# Patient Record
Sex: Female | Born: 1945 | Race: White | Hispanic: No | Marital: Married | Smoking: Never smoker
Health system: Southern US, Community
[De-identification: ages and names within clinical notes are randomized; demographics above are authoritative.]

## PROBLEM LIST (undated history)

## (undated) DIAGNOSIS — E669 Obesity, unspecified: Secondary | ICD-10-CM

## (undated) DIAGNOSIS — Z92241 Personal history of systemic steroid therapy: Secondary | ICD-10-CM

## (undated) DIAGNOSIS — G43009 Migraine without aura, not intractable, without status migrainosus: Secondary | ICD-10-CM

## (undated) DIAGNOSIS — S139XXA Sprain of joints and ligaments of unspecified parts of neck, initial encounter: Secondary | ICD-10-CM

## (undated) DIAGNOSIS — S300XXA Contusion of lower back and pelvis, initial encounter: Secondary | ICD-10-CM

## (undated) DIAGNOSIS — M1990 Unspecified osteoarthritis, unspecified site: Secondary | ICD-10-CM

## (undated) DIAGNOSIS — E785 Hyperlipidemia, unspecified: Secondary | ICD-10-CM

## (undated) DIAGNOSIS — Z9889 Other specified postprocedural states: Secondary | ICD-10-CM

## (undated) DIAGNOSIS — T8859XA Other complications of anesthesia, initial encounter: Secondary | ICD-10-CM

## (undated) DIAGNOSIS — G4730 Sleep apnea, unspecified: Secondary | ICD-10-CM

## (undated) DIAGNOSIS — Z8669 Personal history of other diseases of the nervous system and sense organs: Secondary | ICD-10-CM

## (undated) DIAGNOSIS — R400 Somnolence: Secondary | ICD-10-CM

## (undated) DIAGNOSIS — T4145XA Adverse effect of unspecified anesthetic, initial encounter: Secondary | ICD-10-CM

## (undated) DIAGNOSIS — G4733 Obstructive sleep apnea (adult) (pediatric): Secondary | ICD-10-CM

## (undated) DIAGNOSIS — R251 Tremor, unspecified: Secondary | ICD-10-CM

## (undated) DIAGNOSIS — F418 Other specified anxiety disorders: Secondary | ICD-10-CM

## (undated) DIAGNOSIS — H269 Unspecified cataract: Secondary | ICD-10-CM

## (undated) DIAGNOSIS — G44209 Tension-type headache, unspecified, not intractable: Secondary | ICD-10-CM

## (undated) DIAGNOSIS — C50919 Malignant neoplasm of unspecified site of unspecified female breast: Secondary | ICD-10-CM

## (undated) DIAGNOSIS — E538 Deficiency of other specified B group vitamins: Secondary | ICD-10-CM

## (undated) DIAGNOSIS — R413 Other amnesia: Secondary | ICD-10-CM

## (undated) DIAGNOSIS — R569 Unspecified convulsions: Secondary | ICD-10-CM

## (undated) DIAGNOSIS — I4891 Unspecified atrial fibrillation: Secondary | ICD-10-CM

## (undated) DIAGNOSIS — R112 Nausea with vomiting, unspecified: Secondary | ICD-10-CM

## (undated) DIAGNOSIS — G250 Essential tremor: Secondary | ICD-10-CM

## (undated) DIAGNOSIS — N189 Chronic kidney disease, unspecified: Secondary | ICD-10-CM

## (undated) DIAGNOSIS — I10 Essential (primary) hypertension: Secondary | ICD-10-CM

## (undated) DIAGNOSIS — M797 Fibromyalgia: Secondary | ICD-10-CM

## (undated) HISTORY — PX: BASAL CELL CARCINOMA EXCISION: SHX1214

## (undated) HISTORY — PX: DILATION AND CURETTAGE OF UTERUS: SHX78

## (undated) HISTORY — PX: CATARACT EXTRACTION, BILATERAL: SHX1313

## (undated) HISTORY — DX: Personal history of systemic steroid therapy: Z92.241

## (undated) HISTORY — DX: Obesity, unspecified: E66.9

## (undated) HISTORY — DX: Unspecified cataract: H26.9

## (undated) HISTORY — DX: Migraine without aura, not intractable, without status migrainosus: G43.009

## (undated) HISTORY — DX: Chronic kidney disease, unspecified: N18.9

## (undated) HISTORY — DX: Sprain of joints and ligaments of unspecified parts of neck, initial encounter: S13.9XXA

## (undated) HISTORY — DX: Essential (primary) hypertension: I10

## (undated) HISTORY — DX: Contusion of lower back and pelvis, initial encounter: S30.0XXA

## (undated) HISTORY — DX: Tremor, unspecified: R25.1

## (undated) HISTORY — PX: ABDOMINAL HYSTERECTOMY: SHX81

## (undated) HISTORY — PX: BRAIN MENINGIOMA EXCISION: SHX576

## (undated) HISTORY — DX: Obstructive sleep apnea (adult) (pediatric): G47.33

## (undated) HISTORY — DX: Hyperlipidemia, unspecified: E78.5

## (undated) HISTORY — PX: CHOLECYSTECTOMY: SHX55

## (undated) HISTORY — PX: TUBAL LIGATION: SHX77

## (undated) HISTORY — DX: Tension-type headache, unspecified, not intractable: G44.209

## (undated) HISTORY — DX: Unspecified atrial fibrillation: I48.91

## (undated) HISTORY — PX: LUMBAR SPINE SURGERY: SHX701

## (undated) HISTORY — DX: Other specified anxiety disorders: F41.8

## (undated) HISTORY — DX: Personal history of other diseases of the nervous system and sense organs: Z86.69

## (undated) HISTORY — DX: Unspecified convulsions: R56.9

## (undated) HISTORY — DX: Malignant neoplasm of unspecified site of unspecified female breast: C50.919

## (undated) HISTORY — DX: Somnolence: R40.0

## (undated) HISTORY — DX: Fibromyalgia: M79.7

## (undated) HISTORY — DX: Unspecified osteoarthritis, unspecified site: M19.90

## (undated) HISTORY — PX: OTHER SURGICAL HISTORY: SHX169

## (undated) HISTORY — DX: Deficiency of other specified B group vitamins: E53.8

## (undated) HISTORY — PX: NOSE SURGERY: SHX723

## (undated) HISTORY — DX: Other amnesia: R41.3

## (undated) HISTORY — DX: Essential tremor: G25.0

## (undated) HISTORY — DX: Sleep apnea, unspecified: G47.30

## (undated) HISTORY — PX: BREAST RECONSTRUCTION: SHX9

---

## 1982-11-07 DIAGNOSIS — C801 Malignant (primary) neoplasm, unspecified: Secondary | ICD-10-CM | POA: Insufficient documentation

## 2011-11-08 DIAGNOSIS — Z8669 Personal history of other diseases of the nervous system and sense organs: Secondary | ICD-10-CM | POA: Insufficient documentation

## 2013-02-04 ENCOUNTER — Encounter: Payer: Self-pay | Admitting: Neurology

## 2013-02-04 ENCOUNTER — Ambulatory Visit (INDEPENDENT_AMBULATORY_CARE_PROVIDER_SITE_OTHER): Payer: Medicare Other | Admitting: Neurology

## 2013-02-04 VITALS — BP 114/68 | HR 61 | Ht 69.0 in | Wt 234.0 lb

## 2013-02-04 DIAGNOSIS — M545 Low back pain, unspecified: Secondary | ICD-10-CM

## 2013-02-04 DIAGNOSIS — I480 Paroxysmal atrial fibrillation: Secondary | ICD-10-CM | POA: Insufficient documentation

## 2013-02-04 DIAGNOSIS — D518 Other vitamin B12 deficiency anemias: Secondary | ICD-10-CM

## 2013-02-04 DIAGNOSIS — R413 Other amnesia: Secondary | ICD-10-CM

## 2013-02-04 DIAGNOSIS — G8929 Other chronic pain: Secondary | ICD-10-CM | POA: Insufficient documentation

## 2013-02-04 DIAGNOSIS — I4891 Unspecified atrial fibrillation: Secondary | ICD-10-CM

## 2013-02-04 DIAGNOSIS — G4733 Obstructive sleep apnea (adult) (pediatric): Secondary | ICD-10-CM

## 2013-02-04 DIAGNOSIS — R6889 Other general symptoms and signs: Secondary | ICD-10-CM

## 2013-02-04 DIAGNOSIS — G4730 Sleep apnea, unspecified: Secondary | ICD-10-CM | POA: Insufficient documentation

## 2013-02-04 NOTE — Progress Notes (Signed)
Reason for visit: Memory disorder  Debbie [name] is an 67 y.o. female  History of present illness:  Debbie [name] is a 67 year old right-handed white female with a history of a mild memory disorder, and a history of chronic low back pain and fibromyalgia. The patient indicates that since she was seen in January 2014, her low back issues have worsened. The patient has pain into her right hip, and down the right leg today. The patient also has some discomfort going up the right side back into the right shoulder and neck. The patient indicates that if she turns her head to the left, she gets pain down the right arm. The patient is ambulating with a cane. The patient takes gabapentin at nighttime, but she has ongoing discomfort. The patient may have some discomfort into the right lower abdomen with standing. The patient returns for an evaluation.  Past Medical History  Diagnosis Date  . Hypertension   . Atrial fibrillation   . Depression with anxiety   . Fibromyalgia   . Breast cancer   . Muscle tension headache   . History of partial seizures   . Benign essential tremor   . Obesity   . Drowsiness     Excessive daytime  . Obstructive sleep apnea   . Vitamin B12 deficiency   . Tremors of nervous system   . Dyslipidemia   . Chronic renal insufficiency   . Seizures     Partial  . Memory disturbance     Past Surgical History  Procedure Laterality Date  . Brain meningioma excision Right   . Mastectomies Bilateral   . Abdominal hysterectomy    . Breast reconstructive    . Breast reconstruction Bilateral   . Lumbosacral    . Lumbar spine surgery N/A   . Tubal ligation N/A   . Gallbladder resection    . Cholecystectomy    . Basal cell carcinoma excision    . Basal cell carcinoma excision    . Nose surgery      Basal cell carcinoma resection from the nose  . Dilution    . Dilation and curettage of uterus      Family History  Problem Relation Age of Onset  . Heart failure  Mother   . Diabetes Mother   . Cancer - Colon Father   . Cancer - Colon Sister     Breast cancer  . Stroke Maternal Grandmother   . Diabetes Maternal Grandfather   . Heart failure Maternal Grandfather     Social history:  reports that she has never smoked. She does not have any smokeless tobacco history on file. She reports that she does not drink alcohol or use illicit drugs.  Allergies:  Allergies  Allergen Reactions  . Adhesive (Tape)   . Codeine     Nausea and vomiting  . Fish Oil   . Morphine And Related     Nausea and vomiting    Medications:  No current outpatient prescriptions on file prior to visit.   No current facility-administered medications on file prior to visit.    ROS:  Out of a complete 14 system review of symptoms, the patient complains only of the following symptoms, and all other reviewed systems are negative.  Weight gain Fatigue Swelling in the legs Moles Double vision Shortness of breath, snoring Constipation Urination problems Anemia, easy bruising Joint pain, joint swelling, achy muscles Skin sensitivity Headache, numbness, weakness, dizziness, occasional tremor Insomnia, decreased energy  Blood pressure  114/68, pulse 61, height 5\' 9"  (1.753 m), weight 234 lb (106.142 kg).  Physical Exam  General: The patient is alert and cooperative at the time of the examination. The patient is moderately obese.  Skin: No significant peripheral edema is noted.  Neuromuscular: The patient has pain with internal and external rotation of the right hip, and she has pain with external rotation of the right shoulder. The patient has discomfort with palpation over the right SI joint.   Neurologic Exam  Cranial nerves: Facial symmetry is present. Speech is normal, no aphasia or dysarthria is noted. Extraocular movements are full. Visual fields are full.  Motor: The patient has good strength in all 4 extremities. The patient has giveaway weakness with  flexion at the hip on the right.  Coordination: The patient has good finger-nose-finger and heel-to-shin bilaterally.  Gait and station: The patient has a normal gait. The patient walks with a cane. Tandem gait was not attempted.. Romberg is negative. No drift is seen.  Reflexes: Deep tendon reflexes are symmetric, but are depressed.   Assessment/Plan:  1. Memory disturbance  2. Low back pain, right leg pain  3. Fibromyalgia  The patient believes that her back pain has significantly worsened. The patient has pain in the right leg with internal and external rotation of the hip, and she has significant pain radiating down the right leg with palpation over the right SI joint. The patient has had prior lumbar spine surgery. The patient will be set up for MRI evaluation of the spine, and an x-ray of the right hip. If the studies are unremarkable, the patient may be set up for an SI joint injection on the right. The patient also has some discomfort with external rotation of the right shoulder, and she may have intrinsic arthritis in this area as well. The patient will followup in 3 months.  Marlan Palau MD 02/04/2013 8:00 PM  [facility] [address] [place], [place] [address]  Phone [phone] Fax [phone]

## 2013-02-05 ENCOUNTER — Telehealth: Payer: Self-pay | Admitting: Neurology

## 2013-02-05 ENCOUNTER — Ambulatory Visit
Admission: RE | Admit: 2013-02-05 | Discharge: 2013-02-05 | Disposition: A | Discharge status: Home / Self Care | Payer: Medicare Other | Source: Ambulatory Visit | Attending: Neurology | Admitting: Neurology

## 2013-02-05 DIAGNOSIS — M545 Low back pain, unspecified: Secondary | ICD-10-CM

## 2013-02-05 NOTE — Telephone Encounter (Signed)
I called patient. The x-ray of the right hip shows minimal arthritis. We will pursue MRI evaluation of the lumbosacral spine. If this is unremarkable, I'll set the patient up for an SI joint injection on the right.

## 2013-02-13 ENCOUNTER — Ambulatory Visit
Admission: RE | Admit: 2013-02-13 | Discharge: 2013-02-13 | Disposition: A | Discharge status: Home / Self Care | Payer: Medicare Other | Source: Ambulatory Visit | Attending: Neurology | Admitting: Neurology

## 2013-02-13 DIAGNOSIS — M545 Low back pain, unspecified: Secondary | ICD-10-CM

## 2013-02-15 ENCOUNTER — Telehealth: Payer: Self-pay | Admitting: Neurology

## 2013-02-15 DIAGNOSIS — M79609 Pain in unspecified limb: Secondary | ICD-10-CM

## 2013-02-15 NOTE — Telephone Encounter (Signed)
I called patient. The MRI of the low back shows multiple levels of disc bulges that are mild. The patient has had prior surgery at L5-S1 level of the left. There is no evidence of nerve root impingement or severe spinal stenosis. The patient will be set up for a right SI joint injection. The patient continues to have pain into the right hip and down all the way to the foot. If the SI joint injection does not help, we may do EMG and nerve conduction study evaluation.

## 2013-02-19 ENCOUNTER — Other Ambulatory Visit: Payer: Self-pay | Admitting: Neurology

## 2013-02-19 DIAGNOSIS — M549 Dorsalgia, unspecified: Secondary | ICD-10-CM

## 2013-02-19 DIAGNOSIS — M25551 Pain in right hip: Secondary | ICD-10-CM

## 2013-02-20 ENCOUNTER — Ambulatory Visit
Admission: RE | Admit: 2013-02-20 | Discharge: 2013-02-20 | Disposition: A | Discharge status: Home / Self Care | Payer: Medicare Other | Source: Ambulatory Visit | Attending: Neurology | Admitting: Neurology

## 2013-02-20 DIAGNOSIS — M25551 Pain in right hip: Secondary | ICD-10-CM

## 2013-02-20 DIAGNOSIS — M549 Dorsalgia, unspecified: Secondary | ICD-10-CM

## 2013-02-20 MED ORDER — METHYLPREDNISOLONE ACETATE 40 MG/ML INJ SUSP (RADIOLOG
120.0000 mg | Freq: Once | INTRAMUSCULAR | Status: AC
  Administered 2013-02-20: 120 mg via INTRA_ARTICULAR

## 2013-02-20 MED ORDER — IOHEXOL 180 MG/ML  SOLN
1.0000 mL | Freq: Once | INTRAMUSCULAR | Status: AC | PRN
  Administered 2013-02-20: 1 mL via INTRA_ARTICULAR

## 2013-03-05 ENCOUNTER — Ambulatory Visit: Payer: Self-pay | Admitting: Neurology

## 2013-05-07 ENCOUNTER — Telehealth: Payer: Self-pay | Admitting: Neurology

## 2013-05-07 MED ORDER — PREDNISONE 10 MG PO TABS: ORAL_TABLET | ORAL | Status: DC

## 2013-05-07 NOTE — Telephone Encounter (Signed)
Patient c/o of pain in shoulders and lower back. Pain 9 on pain scale. Has been present since 1 week after hip injection, but feels it has worsened. Nothing alleviates. Patients next OV is 06/26/13. Explained unfortunately no earlier OV at the moment, proceed to ER for urgent matters, advised could be placed on waiting list. Will notify MD for advice. Patient agreed.

## 2013-05-07 NOTE — Telephone Encounter (Signed)
I called patient. She has gotten improvement from the right SI joint injection, without hip pain or pain down the right leg. The patient does have some low back pain, and she will get a lumbar support device. The patient is now having pain in both shoulders and shoulder blade areas. The patient does have some neck pain. The patient denies pain down arms. I'll give a trial on a prednisone Dosepak, and if this is not effective, we may consider MRI evaluation of the cervical spine.

## 2013-05-08 ENCOUNTER — Telehealth: Payer: Self-pay

## 2013-05-08 MED ORDER — PREDNISONE 10 MG PO TABS: ORAL_TABLET | ORAL | Status: DC

## 2013-05-08 NOTE — Telephone Encounter (Signed)
Message copied by Malachy Moan on Wed May 08, 2013 12:23 PM ------      Message from: Seth Bake      Created: Wed May 08, 2013 12:12 PM       Please change the Prednisone RX to [facility] on [address].  It was sent to [facility] and her insurance wont pay them.  Please send today to [facility]   ------

## 2013-06-08 ENCOUNTER — Other Ambulatory Visit: Payer: Self-pay | Admitting: Neurology

## 2013-06-26 ENCOUNTER — Encounter: Payer: Self-pay | Admitting: Neurology

## 2013-06-26 ENCOUNTER — Ambulatory Visit (INDEPENDENT_AMBULATORY_CARE_PROVIDER_SITE_OTHER): Payer: Medicare Other | Admitting: Neurology

## 2013-06-26 VITALS — BP 111/53 | HR 62 | Wt 236.0 lb

## 2013-06-26 DIAGNOSIS — M47812 Spondylosis without myelopathy or radiculopathy, cervical region: Secondary | ICD-10-CM

## 2013-06-26 DIAGNOSIS — S139XXA Sprain of joints and ligaments of unspecified parts of neck, initial encounter: Secondary | ICD-10-CM

## 2013-06-26 DIAGNOSIS — S139XXD Sprain of joints and ligaments of unspecified parts of neck, subsequent encounter: Secondary | ICD-10-CM

## 2013-06-26 DIAGNOSIS — M545 Low back pain, unspecified: Secondary | ICD-10-CM

## 2013-06-26 DIAGNOSIS — S339XXA Sprain of unspecified parts of lumbar spine and pelvis, initial encounter: Secondary | ICD-10-CM

## 2013-06-26 DIAGNOSIS — Z5189 Encounter for other specified aftercare: Secondary | ICD-10-CM

## 2013-06-26 HISTORY — DX: Sprain of joints and ligaments of unspecified parts of neck, initial encounter: S13.9XXA

## 2013-06-26 MED ORDER — PROMETHAZINE HCL 25 MG PO TABS
25.0000 mg | ORAL_TABLET | Freq: Four times a day (QID) | ORAL | Status: DC | PRN
Start: 2013-06-26 — End: 2013-10-26

## 2013-06-26 MED ORDER — LAMOTRIGINE 25 MG PO TABS: 25.0000 mg | ORAL_TABLET | Freq: Two times a day (BID) | ORAL | Status: DC

## 2013-06-26 MED ORDER — GABAPENTIN 300 MG PO CAPS: 300.0000 mg | ORAL_CAPSULE | Freq: Two times a day (BID) | ORAL | Status: DC

## 2013-06-26 NOTE — Progress Notes (Signed)
Reason for visit: Back pain  Mahasin Riviere is an 67 y.o. female  History of present illness:  Ms. Radu is a 67 year old right-handed white female with a history of low back pain. In the past, the patient has benefited with her right hip and leg discomfort following a right SI joint injection. The patient however, still has low back pain. The patient has recently developed some problems with bilateral shoulder and neck discomfort. The patient also has arthritis affecting the left thumb that is quite painful. The patient was recently given a trial on prednisone which markedly improved her symptoms, but she has had some issues with atrial fibrillation. The patient is currently on diltiazem for this reason, and she will be considered for an ablation procedure in the near future. The patient returns for an evaluation. The patient is on gabapentin taking 300 mg twice daily, but she is unable to tolerate higher doses secondary to drowsiness.  Past Medical History  Diagnosis Date  . Hypertension   . Atrial fibrillation   . Depression with anxiety   . Fibromyalgia   . Breast cancer   . Muscle tension headache   . History of partial seizures   . Benign essential tremor   . Obesity   . Drowsiness     Excessive daytime  . Obstructive sleep apnea   . Vitamin B12 deficiency   . Tremors of nervous system   . Dyslipidemia   . Chronic renal insufficiency   . Seizures     Partial  . Memory disturbance   . Sprain of neck 06/26/2013    Past Surgical History  Procedure Laterality Date  . Brain meningioma excision Right   . Mastectomies Bilateral   . Abdominal hysterectomy    . Breast reconstructive    . Breast reconstruction Bilateral   . Lumbosacral    . Lumbar spine surgery N/A   . Tubal ligation N/A   . Gallbladder resection    . Cholecystectomy    . Basal cell carcinoma excision    . Basal cell carcinoma excision    . Nose surgery      Basal cell carcinoma resection from the nose   . Dilution    . Dilation and curettage of uterus      Family History  Problem Relation Age of Onset  . Heart failure Mother   . Diabetes Mother   . Cancer - Colon Father   . Cancer - Colon Sister     Breast cancer  . Stroke Maternal Grandmother   . Diabetes Maternal Grandfather   . Heart failure Maternal Grandfather     Social history:  reports that she has never smoked. She has never used smokeless tobacco. She reports that she does not drink alcohol or use illicit drugs.    Allergies  Allergen Reactions  . Adhesive [Tape] Other (See Comments)    Peels skin off (bandaids, too)  . Codeine Nausea And Vomiting  . Fish Oil Nausea And Vomiting  . Morphine And Related Nausea And Vomiting    Medications:  Current Outpatient Prescriptions on File Prior to Visit  Medication Sig Dispense Refill  . acetaminophen (TYLENOL) 500 MG tablet Take 1,000 mg by mouth every 6 (six) hours as needed for pain.       Marland Kitchen aspirin 325 MG tablet Take 325 mg by mouth daily.      . cyanocobalamin 100 MCG tablet Take 100 mcg by mouth daily.      Marland Kitchen dronedarone (MULTAQ)  400 MG tablet Take 400 mg by mouth 2 (two) times daily with a meal.      . fenofibrate 160 MG tablet Take 160 mg by mouth daily.      Marland Kitchen lisinopril (PRINIVIL,ZESTRIL) 5 MG tablet Take 5 mg by mouth daily.       No current facility-administered medications on file prior to visit.    ROS:  Out of a complete 14 system review of symptoms, the patient complains only of the following symptoms, and all other reviewed systems are negative.  Fatigue Shortness of breath, snoring Constipation Moles Urination problems Easy bruising Joint pain, achy muscles Skin sensitivity Weakness, dizziness, tremor Decreased energy  Blood pressure 111/53, pulse 62, weight 236 lb (107.049 kg).  Physical Exam  General: The patient is alert and cooperative at the time of the examination. The patient is moderately to markedly obese.  Skin: No  significant peripheral edema is noted.   Neurologic Exam  Cranial nerves: Facial symmetry is present. Speech is normal, no aphasia or dysarthria is noted. Extraocular movements are full. Visual fields are full.  Motor: The patient has good strength in all 4 extremities.  Coordination: The patient has good finger-nose-finger and heel-to-shin bilaterally.  Gait and station: The patient has a normal gait. Tandem gait is unsteady. Romberg is negative. No drift is seen.  Reflexes: Deep tendon reflexes are symmetric.   Assessment/Plan:  1. Chronic low back pain  2. Neck pain  The patient will be set up for MRI evaluation of the cervical spine. The patient will be given a trial on Gralise to see if she can tolerate higher doses of gabapentin. If this helps her, she will contact our office. The patient will be set up for neuromuscular therapy on the neck and low back. The patient followup in 4 or 5 months.  Marlan Palau MD 06/26/2013 7:58 PM  [facility] [address] [place], [place] [address]  Phone [phone] Fax [phone]

## 2013-07-10 ENCOUNTER — Telehealth: Payer: Self-pay | Admitting: Neurology

## 2013-07-10 NOTE — Telephone Encounter (Signed)
Gabapentin and Lamictal were both already sent to the pharmacy on 06/26/13 (E-Prescribing Status: Receipt confirmed by pharmacy (06/26/2013 4:17 PM EDT) I called the pharmacy, they said meds are ready, but patient has not been in to pick them up.  I called the patient back.  She is aware Rx's are ready and will pick them up today.  Stated Gralise gives her severe constipation, so she has d/c that medication.

## 2013-07-23 DIAGNOSIS — M47812 Spondylosis without myelopathy or radiculopathy, cervical region: Secondary | ICD-10-CM

## 2013-07-26 ENCOUNTER — Other Ambulatory Visit: Payer: Self-pay | Admitting: Diagnostic Neuroimaging

## 2013-07-26 DIAGNOSIS — M47812 Spondylosis without myelopathy or radiculopathy, cervical region: Secondary | ICD-10-CM

## 2013-07-29 ENCOUNTER — Telehealth: Payer: Self-pay | Admitting: Neurology

## 2013-07-29 NOTE — Telephone Encounter (Signed)
I called patient. The MRI of the cervical spine shows spondylosis and neuroforaminal stenosis at the C56 and C6-7 levels. This is the likely cause of her neck discomfort. The patient will be going for ablation procedure for her heart, and she will delay the physical therapy for neuromuscular therapy until after that procedure. If need be in the future, epidural steroid injections or cervical traction may be helpful. The patient will go on gabapentin, taking 300 mg in the morning, 600 mg in the evening. The patient could not tolerate Gralise secondary to constipation.

## 2013-10-25 ENCOUNTER — Encounter: Payer: Self-pay | Admitting: Neurology

## 2013-10-25 ENCOUNTER — Encounter (INDEPENDENT_AMBULATORY_CARE_PROVIDER_SITE_OTHER): Payer: Self-pay

## 2013-10-25 ENCOUNTER — Ambulatory Visit (INDEPENDENT_AMBULATORY_CARE_PROVIDER_SITE_OTHER): Payer: Medicare Other | Admitting: Neurology

## 2013-10-25 VITALS — BP 117/62 | HR 62 | Ht 68.5 in | Wt 234.0 lb

## 2013-10-25 DIAGNOSIS — M545 Low back pain, unspecified: Secondary | ICD-10-CM

## 2013-10-25 DIAGNOSIS — S139XXS Sprain of joints and ligaments of unspecified parts of neck, sequela: Secondary | ICD-10-CM

## 2013-10-25 DIAGNOSIS — R51 Headache: Secondary | ICD-10-CM

## 2013-10-25 NOTE — Progress Notes (Signed)
Reason for visit: Neck and low back pain  Debbie [name] is an 67 y.o. female  History of present illness:  Debbie [name] is a 67 year old right-handed white female with a history of atrial fibrillation, and the patient indicates that she recently underwent a cardiac ablation therapy that has been successful. The patient is on Xarelto at this point. The patient has had some issues with neck and shoulder discomfort, but she indicates that this was improved following the ablation therapy. The patient continues to have some low back pain. The patient is afraid to undergo physical therapy, and she believes this may worsen her pain. The patient is on gabapentin taking 300 mg twice daily. The patient is otherwise tolerating the medication well. The patient apparently had her blood pressure taken from her left arm one month ago, and the patient began having left sided shoulder pain and headaches on the top of her head following this. The patient continues to have headache. A MRI study of the brain was done, and the patient was told this was abnormal, and she was sent back for another study with contrast. The actual results of the study are unavailable to me. The patient had a study done at [facility]. The patient comes to this office for an evaluation.  Past Medical History  Diagnosis Date  . Hypertension   . Atrial fibrillation   . Depression with anxiety   . Fibromyalgia   . Breast cancer   . Muscle tension headache   . History of partial seizures   . Benign essential tremor   . Obesity   . Drowsiness     Excessive daytime  . Obstructive sleep apnea   . Vitamin B12 deficiency   . Tremors of nervous system   . Dyslipidemia   . Chronic renal insufficiency   . Seizures     Partial  . Memory disturbance   . Sprain of neck 06/26/2013    Past Surgical History  Procedure Laterality Date  . Brain meningioma excision Right   . Mastectomies Bilateral   . Abdominal  hysterectomy    . Breast reconstructive    . Breast reconstruction Bilateral   . Lumbosacral    . Lumbar spine surgery N/A   . Tubal ligation N/A   . Gallbladder resection    . Cholecystectomy    . Basal cell carcinoma excision    . Basal cell carcinoma excision    . Nose surgery      Basal cell carcinoma resection from the nose  . Dilution    . Dilation and curettage of uterus    . Ablation procedure      Cardiac, for Afib    Family History  Problem Relation Age of Onset  . Heart failure Mother   . Diabetes Mother   . Cancer - Colon Father   . Breast cancer Sister   . Stroke Maternal Grandmother   . Diabetes Maternal Grandfather   . Heart failure Maternal Grandfather     Social history:  reports that she has never smoked. She has never used smokeless tobacco. She reports that she does not drink alcohol or use illicit drugs.    Allergies  Allergen Reactions  . Adhesive [Tape] Other (See Comments)    Peels skin off (bandaids, too)  . Codeine Nausea And Vomiting  . Fish Oil Nausea And Vomiting  . Morphine And Related Nausea And Vomiting    Medications:  Current Outpatient Prescriptions on File Prior  to Visit  Medication Sig Dispense Refill  . acetaminophen (TYLENOL) 500 MG tablet Take 1,000 mg by mouth every 6 (six) hours as needed for pain.       Marland Kitchen aspirin 325 MG tablet Take 325 mg by mouth daily.      . calcium carbonate (OS-CAL) 600 MG TABS tablet Take 600 mg by mouth daily.      . cholecalciferol (VITAMIN D) 400 UNITS TABS tablet Take 400 Units by mouth daily.      . cyanocobalamin 100 MCG tablet Take 100 mcg by mouth daily.      Marland Kitchen dronedarone (MULTAQ) 400 MG tablet Take 400 mg by mouth 2 (two) times daily with a meal.      . fenofibrate 160 MG tablet Take 160 mg by mouth daily.      Marland Kitchen gabapentin (NEURONTIN) 300 MG capsule Take 1 capsule (300 mg total) by mouth 2 (two) times daily.  60 capsule  5  . lamoTRIgine (LAMICTAL) 25 MG tablet Take 1 tablet (25 mg total)  by mouth 2 (two) times daily.  60 tablet  5  . promethazine (PHENERGAN) 25 MG tablet Take 1 tablet (25 mg total) by mouth every 6 (six) hours as needed for nausea (1/4 tab prn).  30 tablet  3   No current facility-administered medications on file prior to visit.    ROS:  Out of a complete 14 system review of symptoms, the patient complains only of the following symptoms, and all other reviewed systems are negative.  Fatigue Double vision Snoring Constipation Moles Easy bruising Joint pain, joint swelling, achy muscles Skin sensitivity Headache, dizziness, tremor Anxiety, not enough sleep, snoring  Blood pressure 117/62, pulse 62, height 5' 8.5" (1.74 m), weight 234 lb (106.142 kg).  Physical Exam  General: The patient is alert and cooperative at the time of the examination. The patient is moderately to markedly obese.  Neuromuscular: Range of movement of the cervical spine lacks about 15 of full lateral rotation bilaterally.  Skin: No significant peripheral edema is noted.   Neurologic Exam  Mental status: The patient is oriented x 3.  Cranial nerves: Facial symmetry is present. Speech is normal, no aphasia or dysarthria is noted. Extraocular movements are full. Visual fields are full.  Motor: The patient has good strength in all 4 extremities, with the exception that there is mild proximal weakness of the legs, left greater than right.  Sensory examination: Soft touch sensation on the face, arms, and legs is symmetric.  Coordination: The patient has good finger-nose-finger and heel-to-shin bilaterally.  Gait and station: The patient has a normal gait. Tandem gait is unsteady. Romberg is negative. No drift is seen.  Reflexes: Deep tendon reflexes are symmetric.   Assessment/Plan:  1. Cervical spondylosis  2. Lumbosacral spondylosis  3. Headache  The patient will be given a prescription for a back brace, as she does not wish to pursue physical therapy for her  neck and back. I am not clear how by checking a blood pressure would cause a headache, but this is what the patient indicated happened. The patient apparently has had a recent MRI of the brain, and we will need to try to get the results through [facility]. The patient will followup otherwise in 6 months.   Addendum: I received a report of the MRI study of the brain later today. 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 There is  some question whether there may be a metastatic tumor, and for this reason the contrast study will be done.  Marlan Palau MD 10/26/2013 8:57 AM  [facility] [address] [place], [place] [address]  Phone [phone] Fax [phone]

## 2013-10-25 NOTE — Patient Instructions (Signed)

## 2013-10-28 NOTE — Addendum Note (Signed)
Addended by: Stephanie Acre on: 10/28/2013 07:53 AM   Modules accepted: Medications

## 2013-10-30 ENCOUNTER — Telehealth: Payer: Self-pay | Admitting: Neurology

## 2013-10-30 DIAGNOSIS — Z8679 Personal history of other diseases of the circulatory system: Secondary | ICD-10-CM

## 2013-10-30 NOTE — Telephone Encounter (Signed)
I called the patient. The MRI of the brain with contrast was unremarkable. 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 The area that they're seeing on this current scan likely represents the postsurgical changes in the right parietal area. I will check a carotid Doppler study. This lesion in the right brain is quite chronic.

## 2013-11-19 ENCOUNTER — Ambulatory Visit (INDEPENDENT_AMBULATORY_CARE_PROVIDER_SITE_OTHER): Payer: Medicare Other

## 2013-11-19 DIAGNOSIS — I6789 Other cerebrovascular disease: Secondary | ICD-10-CM

## 2013-11-19 DIAGNOSIS — Z8679 Personal history of other diseases of the circulatory system: Secondary | ICD-10-CM

## 2013-11-25 ENCOUNTER — Telehealth: Payer: Self-pay | Admitting: Neurology

## 2013-11-25 NOTE — Telephone Encounter (Signed)
I called patient. A carotid Doppler study was unremarkable. The abnormalities seen in the right brain likely was the area where the meningioma was previously resected.

## 2013-11-27 ENCOUNTER — Other Ambulatory Visit: Payer: Self-pay

## 2013-11-27 MED ORDER — GABAPENTIN 300 MG PO CAPS: ORAL_CAPSULE | ORAL | Status: DC

## 2013-11-27 NOTE — Telephone Encounter (Signed)
Phone note says:  Debbie Ducking, MD at 07/29/2013 7:19 PM     Status: Signed        I called patient. The MRI of the cervical spine shows spondylosis and neuroforaminal stenosis at the C56 and C6-7 levels. This is the likely cause of her neck discomfort. The patient will be going for ablation procedure for her heart, and she will delay the physical therapy for neuromuscular therapy until after that procedure. If need be in the future, epidural steroid injections or cervical traction may be helpful. The patient will go on gabapentin, taking 300 mg in the morning, 600 mg in the evening. The patient could not tolerate Gralise secondary to constipation.

## 2014-01-05 ENCOUNTER — Other Ambulatory Visit: Payer: Self-pay | Admitting: Neurology

## 2014-01-15 ENCOUNTER — Ambulatory Visit: Payer: Medicare Other | Admitting: Neurology

## 2014-04-28 ENCOUNTER — Ambulatory Visit (INDEPENDENT_AMBULATORY_CARE_PROVIDER_SITE_OTHER): Payer: Medicare Other | Admitting: Neurology

## 2014-04-28 ENCOUNTER — Encounter (INDEPENDENT_AMBULATORY_CARE_PROVIDER_SITE_OTHER): Payer: Self-pay

## 2014-04-28 ENCOUNTER — Encounter: Payer: Self-pay | Admitting: Neurology

## 2014-04-28 ENCOUNTER — Ambulatory Visit: Payer: Medicare Other | Admitting: Neurology

## 2014-04-28 ENCOUNTER — Telehealth: Payer: Self-pay | Admitting: Neurology

## 2014-04-28 VITALS — BP 117/66 | HR 68 | Wt 233.0 lb

## 2014-04-28 DIAGNOSIS — M545 Low back pain, unspecified: Secondary | ICD-10-CM

## 2014-04-28 DIAGNOSIS — S139XXS Sprain of joints and ligaments of unspecified parts of neck, sequela: Secondary | ICD-10-CM

## 2014-04-28 NOTE — Telephone Encounter (Signed)
Pharmacy has been updated in chart.

## 2014-04-28 NOTE — Patient Instructions (Signed)

## 2014-04-28 NOTE — Telephone Encounter (Signed)
Patient's new pharmacy is [facility] [address] [phone] - [place]

## 2014-04-28 NOTE — Progress Notes (Signed)
Reason for visit: Back pain, neck pain  Debbie [name] is an 68 y.o. female  History of present illness:  Debbie [name] is a 68 year old right-handed white female with a history of low back pain and discomfort down the right leg predominantly to the ankle. The patient has undergone MRI evaluation within the last year of the low back and cervical spine. She has mild degenerative changes in the right hip. The patient has gotten 6 months of benefit from the right SI joint injection that was done in April of 2014. The pain has returned, and she has difficulty walking because of the discomfort. The pain is quite severe when she first stands up. The patient has never gone through physical therapy. She is on Xarelto, but she underwent ablation therapy for atrial fibrillation in October 2014, and she has been in normal sinus rhythm. Her cardiologist wants to keep her on the Xarelto at this time. She returns for an evaluation. The patient indicates that her neck discomfort and shoulder discomfort have improved gradually over time.  Past Medical History  Diagnosis Date  . Hypertension   . Atrial fibrillation   . Depression with anxiety   . Fibromyalgia   . Breast cancer   . Muscle tension headache   . History of partial seizures   . Benign essential tremor   . Obesity   . Drowsiness     Excessive daytime  . Obstructive sleep apnea   . Vitamin B12 deficiency   . Tremors of nervous system   . Dyslipidemia   . Chronic renal insufficiency   . Seizures     Partial  . Memory disturbance   . Sprain of neck 06/26/2013    Past Surgical History  Procedure Laterality Date  . Brain meningioma excision Right   . Mastectomies Bilateral   . Abdominal hysterectomy    . Breast reconstructive    . Breast reconstruction Bilateral   . Lumbosacral    . Lumbar spine surgery N/A   . Tubal ligation N/A   . Gallbladder resection    . Cholecystectomy    . Basal cell carcinoma excision    . Basal cell  carcinoma excision    . Nose surgery      Basal cell carcinoma resection from the nose  . Dilution    . Dilation and curettage of uterus    . Ablation procedure      Cardiac, for Afib    Family History  Problem Relation Age of Onset  . Heart failure Mother   . Diabetes Mother   . Cancer - Colon Father   . Breast cancer Sister   . Stroke Maternal Grandmother   . Diabetes Maternal Grandfather   . Heart failure Maternal Grandfather     Social history:  reports that she has never smoked. She has never used smokeless tobacco. She reports that she does not drink alcohol or use illicit drugs.    Allergies  Allergen Reactions  . Adhesive [Tape] Other (See Comments)    Peels skin off (bandaids, too)  . Codeine Nausea And Vomiting  . Fish Oil Nausea And Vomiting  . Morphine And Related Nausea And Vomiting    Medications:  Current Outpatient Prescriptions on File Prior to Visit  Medication Sig Dispense Refill  . acetaminophen (TYLENOL) 500 MG tablet Take 1,000 mg by mouth every 6 (six) hours as needed.      Marland Kitchen aspirin 325 MG tablet Take 325 mg by mouth daily.      Marland Kitchen  calcium carbonate (OS-CAL) 600 MG TABS tablet Take 600 mg by mouth daily with breakfast.      . cyanocobalamin 100 MCG tablet Take 100 mcg by mouth daily.      Marland Kitchen dronedarone (MULTAQ) 400 MG tablet Take 400 mg by mouth 2 (two) times daily with a meal.      . fenofibrate 160 MG tablet Take 160 mg by mouth daily.      Marland Kitchen gabapentin (NEURONTIN) 300 MG capsule One in the morning and two in the evening  90 capsule  6  . lamoTRIgine (LAMICTAL) 25 MG tablet TAKE ONE TABLET BY MOUTH TWICE DAILY  60 tablet  6  . promethazine (PHENERGAN) 25 MG tablet Take 25 mg by mouth every 6 (six) hours as needed for nausea or vomiting.      . ranolazine (RANEXA) 500 MG 12 hr tablet Take 500 mg by mouth 2 (two) times daily.      . Rivaroxaban (XARELTO) 15 MG TABS tablet Take 15 mg by mouth daily with supper.      . vitamin E 400 UNIT capsule Take  400 Units by mouth daily.       No current facility-administered medications on file prior to visit.    ROS:  Out of a complete 14 system review of symptoms, the patient complains only of the following symptoms, and all other reviewed systems are negative.  Activity change, fatigue Neck pain, neck stiffness, runny nose, difficulty swallowing on occasion Light sensitivity Cold intolerance, heat intolerance, flushing Abdominal pain, constipation, nausea Frequent waking, snoring Difficulty urinating, and decreased urine Joint pain, joint swelling, back pain, achy muscles, muscle cramps, walking difficulties, coordination problems Bruising easily Dizziness, headache, numbness, speech difficulties, weakness, tremors Anxiety, depression  Blood pressure 117/66, pulse 68, weight 233 lb (105.688 kg).  Physical Exam  General: The patient is alert and cooperative at the time of the examination. The patient is markedly obese.  Neuromuscular: The patient has significant pain with internal rotation of the right hip.  Skin: No significant peripheral edema is noted.   Neurologic Exam  Mental status: The Mini-Mental status examination done today shows a total score 29/30.  Cranial nerves: Facial symmetry is present. Speech is normal, no aphasia or dysarthria is noted. Extraocular movements are full. Visual fields are full.  Motor: The patient has good strength in all 4 extremities.  Sensory examination: Soft touch sensation is symmetric on the face, arms, or legs.  Coordination: The patient has good finger-nose-finger and heel-to-shin bilaterally.  Gait and station: The patient has a slightly wide-based, limping type gait on the right leg. Tandem gait was not attempted. Romberg is negative. No drift is seen.  Reflexes: Deep tendon reflexes are symmetric.   MRI lumbar 02/14/13:  IMPRESSION:  Abnormal MRI lumbar spine (without) demonstrating: 1. Multi-level disc bulging. Mild  biforaminal foraminal stenosis at L2-3 and L3-4. 2. Post-surgical changes at L5-S1.    Assessment/Plan:  1. Chronic low back pain, right leg pain  2. Cervical spondylosis  The patient has evidence of significant pain with rotation at the right hip, and she has right SI joint dysfunction that responded for 6 months following an injection. She has lumbosacral spondylosis and cervical spondylosis creating a chronic pain syndrome. The patient is markedly overweight as well. She will be set up for an SI joint injection on the right. This helped her pain significantly previously. The patient will continue gabapentin for now. She will followup in about 6 months.  Jill Alexanders  MD 04/28/2014 8:19 PM  [facility] [address] [place], [place] [address]  Phone [phone] Fax [phone]

## 2014-05-15 ENCOUNTER — Telehealth: Payer: Self-pay | Admitting: Neurology

## 2014-05-15 NOTE — Telephone Encounter (Signed)
Daphene Jaeger with [employer] @ [phone], calling to check status to medical letter of necessity for back belt.  Needing MD's signature and faxed to [phone].  Please call and advise.

## 2014-05-16 NOTE — Telephone Encounter (Signed)
Debbie [name] with [employer] [facility] called wanting to know if we receive the faxed authorization form for the pt to get back belt. I asked Ms. Debbie [name] if she could refax the information and she stated that she would. I informed Ms. Debbie [name] that we would be giving the information to Dr. Jannifer Franklin. I called the pt and the pt stated that she would like to get this back belt as long as she does not have to come out of pocket. I advised them both that if they have any other questions to please give out office a call. Both verbalized understanding.

## 2014-06-12 NOTE — Telephone Encounter (Signed)
Noted  

## 2014-08-10 ENCOUNTER — Other Ambulatory Visit: Payer: Self-pay | Admitting: Neurology

## 2014-09-24 ENCOUNTER — Encounter: Payer: Self-pay | Admitting: Neurology

## 2014-09-27 ENCOUNTER — Other Ambulatory Visit: Payer: Self-pay | Admitting: Neurology

## 2014-09-30 ENCOUNTER — Encounter: Payer: Self-pay | Admitting: Neurology

## 2014-10-23 ENCOUNTER — Ambulatory Visit: Payer: Medicare Other | Admitting: Neurology

## 2014-11-12 ENCOUNTER — Encounter: Payer: Self-pay | Admitting: Neurology

## 2014-11-12 ENCOUNTER — Ambulatory Visit (INDEPENDENT_AMBULATORY_CARE_PROVIDER_SITE_OTHER): Payer: Medicare Other | Admitting: Neurology

## 2014-11-12 VITALS — BP 116/63 | HR 54 | Ht 70.0 in | Wt 233.6 lb

## 2014-11-12 DIAGNOSIS — M545 Low back pain: Secondary | ICD-10-CM | POA: Diagnosis not present

## 2014-11-12 DIAGNOSIS — S139XXS Sprain of joints and ligaments of unspecified parts of neck, sequela: Secondary | ICD-10-CM

## 2014-11-12 MED ORDER — TRAMADOL HCL 50 MG PO TABS: 50.0000 mg | ORAL_TABLET | Freq: Four times a day (QID) | ORAL | Status: DC | PRN

## 2014-11-12 MED ORDER — PROMETHAZINE HCL 25 MG PO TABS: 25.0000 mg | ORAL_TABLET | Freq: Four times a day (QID) | ORAL | Status: DC | PRN

## 2014-11-12 NOTE — Progress Notes (Signed)
Reason for visit: Chronic back pain  Debbie [name] is an 69 y.o. female  History of present illness:  Debbie [name] is a 69 year old right-handed white female with a history of chronic neck and shoulder pain and low back pain. The patient has fibromyalgia, and she has had some issues with atrial fibrillation recently. The patient is on medications for her atrial fibrillation that recurred after ablation therapy. The patient has had some increase in tremors associated with increased pain. She is on gabapentin taking 300 mg in the morning and 600 mg in the evening. She is on Xarelto. She has a back brace that has been somewhat helpful for her. She returns for an evaluation.  Past Medical History  Diagnosis Date  . Hypertension   . Atrial fibrillation   . Depression with anxiety   . Fibromyalgia   . Breast cancer   . Muscle tension headache   . History of partial seizures   . Benign essential tremor   . Obesity   . Drowsiness     Excessive daytime  . Obstructive sleep apnea   . Vitamin B12 deficiency   . Tremors of nervous system   . Dyslipidemia   . Chronic renal insufficiency   . Seizures     Partial  . Memory disturbance   . Sprain of neck 06/26/2013    Past Surgical History  Procedure Laterality Date  . Brain meningioma excision Right   . Mastectomies Bilateral   . Abdominal hysterectomy    . Breast reconstructive    . Breast reconstruction Bilateral   . Lumbosacral    . Lumbar spine surgery N/A   . Tubal ligation N/A   . Gallbladder resection    . Cholecystectomy    . Basal cell carcinoma excision    . Basal cell carcinoma excision    . Nose surgery      Basal cell carcinoma resection from the nose  . Dilution    . Dilation and curettage of uterus    . Ablation procedure      Cardiac, for Afib    Family History  Problem Relation Age of Onset  . Heart failure Mother   . Diabetes Mother   . Cancer - Colon Father   . Breast cancer Sister   . Stroke  Maternal Grandmother   . Diabetes Maternal Grandfather   . Heart failure Maternal Grandfather     Social history:  reports that she has never smoked. She has never used smokeless tobacco. She reports that she does not drink alcohol or use illicit drugs.    Allergies  Allergen Reactions  . Adhesive [Tape] Other (See Comments)    Peels skin off (bandaids, too)  . Codeine Nausea And Vomiting  . Fish Oil Nausea And Vomiting  . Morphine And Related Nausea And Vomiting    Medications:  Current Outpatient Prescriptions on File Prior to Visit  Medication Sig Dispense Refill  . acetaminophen (TYLENOL) 500 MG tablet Take 1,000 mg by mouth every 6 (six) hours as needed.    Marland Kitchen aspirin 325 MG tablet Take 325 mg by mouth daily.    . calcium carbonate (OS-CAL) 600 MG TABS tablet Take 600 mg by mouth daily with breakfast.    . fenofibrate 160 MG tablet Take 160 mg by mouth daily.    Marland Kitchen gabapentin (NEURONTIN) 300 MG capsule TAKE ONE CAPSULE BY MOUTH IN THE MORNING AND  TWO CAPSULES  IN THE EVENING 90 capsule 3  . lamoTRIgine (  LAMICTAL) 25 MG tablet TAKE ONE TABLET BY MOUTH TWICE DAILY 60 tablet 6  . ranolazine (RANEXA) 500 MG 12 hr tablet Take 500 mg by mouth 2 (two) times daily.    . Rivaroxaban (XARELTO) 15 MG TABS tablet Take 15 mg by mouth daily with supper.    . vitamin E 400 UNIT capsule Take 400 Units by mouth daily.     No current facility-administered medications on file prior to visit.    ROS:  Out of a complete 14 system review of symptoms, the patient complains only of the following symptoms, and all other reviewed systems are negative.  Activity change, fatigue Right nose, drooling Light sensitivity, blurred vision Shortness of breath Chest pain Cold intolerance, heat intolerance Abdominal pain, constipation, nausea, rectal pain Insomnia, snoring Incontinence of bladder, frequency of urination Joint pain, joint swelling, back pain, achy muscles, muscle cramps, walking  difficulties, neck pain, neck stiffness Moles, itching Dizziness, headache, numbness, weakness, tremors Depression, anxiety  Blood pressure 116/63, pulse 54, height 5\' 10"  (1.778 m), weight 233 lb 9.6 oz (105.96 kg).  Physical Exam  General: The patient is alert and cooperative at the time of the examination. The patient is moderately obese.  Skin: No significant peripheral edema is noted.   Neurologic Exam  Mental status: The patient is oriented x 3.  Cranial nerves: Facial symmetry is present. Speech is normal, no aphasia or dysarthria is noted. Extraocular movements are full. Visual fields are full.  Motor: The patient has good strength in all 4 extremities, with exception that there is some giveaway weakness with hip flexion bilaterally, right greater than left.  Sensory examination: Soft touch sensation on the face, arms, and legs is symmetric.  Coordination: The patient has good finger-nose-finger and heel-to-shin bilaterally.  Gait and station: The patient has a normal gait. Tandem gait is minimally unsteady. Romberg is negative. No drift is seen.  Reflexes: Deep tendon reflexes are symmetric, but are depressed.   Assessment/Plan:  1. Fibromyalgia  2. Chronic low back pain  3. History of tremors  The patient continues to have ongoing pain issues. She will be given a prescription for Ultram, she will continue the gabapentin. The Phenergan prescription was renewed. The patient will be sent for physical therapy for neuromuscular therapy on the neck and shoulders. She will follow-up in 6 months.  Jill Alexanders MD 11/12/2014 6:40 PM  [facility] [address] [place], [place] [address]  Phone [phone] Fax [phone]

## 2014-11-12 NOTE — Patient Instructions (Signed)
Back Pain, Adult Low back pain is very common. About 1 in 5 people have back pain.The cause of low back pain is rarely dangerous. The pain often gets better over time.About half of people with a sudden onset of back pain feel better in just 2 weeks. About 8 in 10 people feel better by 6 weeks.  CAUSES Some common causes of back pain include:  Strain of the muscles or ligaments supporting the spine.  Wear and tear (degeneration) of the spinal discs.  Arthritis.  Direct injury to the back. DIAGNOSIS Most of the time, the direct cause of low back pain is not known.However, back pain can be treated effectively even when the exact cause of the pain is unknown.Answering your caregiver's questions about your overall health and symptoms is one of the most accurate ways to make sure the cause of your pain is not dangerous. If your caregiver needs more information, he or she may order lab work or imaging tests (X-rays or MRIs).However, even if imaging tests show changes in your back, this usually does not require surgery. HOME CARE INSTRUCTIONS For many people, back pain returns.Since low back pain is rarely dangerous, it is often a condition that people can learn to manageon their own.   Remain active. It is stressful on the back to sit or stand in one place. Do not sit, drive, or stand in one place for more than 30 minutes at a time. Take short walks on level surfaces as soon as pain allows.Try to increase the length of time you walk each day.  Do not stay in bed.Resting more than 1 or 2 days can delay your recovery.  Do not avoid exercise or work.Your body is made to move.It is not dangerous to be active, even though your back may hurt.Your back will likely heal faster if you return to being active before your pain is gone.  Pay attention to your body when you bend and lift. Many people have less discomfortwhen lifting if they bend their knees, keep the load close to their bodies,and  avoid twisting. Often, the most comfortable positions are those that put less stress on your recovering back.  Find a comfortable position to sleep. Use a firm mattress and lie on your side with your knees slightly bent. If you lie on your back, put a pillow under your knees.  Only take over-the-counter or prescription medicines as directed by your caregiver. Over-the-counter medicines to reduce pain and inflammation are often the most helpful.Your caregiver may prescribe muscle relaxant drugs.These medicines help dull your pain so you can more quickly return to your normal activities and healthy exercise.  Put ice on the injured area.  Put ice in a plastic bag.  Place a towel between your skin and the bag.  Leave the ice on for 15-20 minutes, 03-04 times a day for the first 2 to 3 days. After that, ice and heat may be alternated to reduce pain and spasms.  Ask your caregiver about trying back exercises and gentle massage. This may be of some benefit.  Avoid feeling anxious or stressed.Stress increases muscle tension and can worsen back pain.It is important to recognize when you are anxious or stressed and learn ways to manage it.Exercise is a great option. SEEK MEDICAL CARE IF:  You have pain that is not relieved with rest or medicine.  You have pain that does not improve in 1 week.  You have new symptoms.  You are generally not feeling well. SEEK   IMMEDIATE MEDICAL CARE IF:   You have pain that radiates from your back into your legs.  You develop new bowel or bladder control problems.  You have unusual weakness or numbness in your arms or legs.  You develop nausea or vomiting.  You develop abdominal pain.  You feel faint. Document Released: 10/24/2005 Document Revised: 04/24/2012 Document Reviewed: 02/25/2014 [employer] Patient Information 2015 [employer]. This information is not intended to replace advice given to you by your health care provider. Make sure you  discuss any questions you have with your health care provider.  

## 2014-11-13 DIAGNOSIS — I4891 Unspecified atrial fibrillation: Secondary | ICD-10-CM | POA: Diagnosis not present

## 2014-11-17 DIAGNOSIS — I10 Essential (primary) hypertension: Secondary | ICD-10-CM | POA: Diagnosis not present

## 2014-11-17 DIAGNOSIS — E785 Hyperlipidemia, unspecified: Secondary | ICD-10-CM | POA: Diagnosis not present

## 2014-11-17 DIAGNOSIS — I480 Paroxysmal atrial fibrillation: Secondary | ICD-10-CM | POA: Diagnosis not present

## 2014-11-21 DIAGNOSIS — E785 Hyperlipidemia, unspecified: Secondary | ICD-10-CM | POA: Diagnosis not present

## 2014-11-21 DIAGNOSIS — R569 Unspecified convulsions: Secondary | ICD-10-CM | POA: Diagnosis not present

## 2014-11-21 DIAGNOSIS — M1990 Unspecified osteoarthritis, unspecified site: Secondary | ICD-10-CM | POA: Diagnosis not present

## 2014-12-31 ENCOUNTER — Ambulatory Visit: Payer: Medicare Other | Attending: Neurology

## 2014-12-31 DIAGNOSIS — R6889 Other general symptoms and signs: Secondary | ICD-10-CM | POA: Diagnosis not present

## 2014-12-31 DIAGNOSIS — I129 Hypertensive chronic kidney disease with stage 1 through stage 4 chronic kidney disease, or unspecified chronic kidney disease: Secondary | ICD-10-CM | POA: Diagnosis not present

## 2014-12-31 DIAGNOSIS — I4891 Unspecified atrial fibrillation: Secondary | ICD-10-CM | POA: Insufficient documentation

## 2014-12-31 DIAGNOSIS — R531 Weakness: Secondary | ICD-10-CM | POA: Insufficient documentation

## 2014-12-31 DIAGNOSIS — G4733 Obstructive sleep apnea (adult) (pediatric): Secondary | ICD-10-CM | POA: Diagnosis not present

## 2014-12-31 DIAGNOSIS — M545 Low back pain, unspecified: Secondary | ICD-10-CM

## 2014-12-31 DIAGNOSIS — N189 Chronic kidney disease, unspecified: Secondary | ICD-10-CM | POA: Diagnosis not present

## 2014-12-31 DIAGNOSIS — M797 Fibromyalgia: Secondary | ICD-10-CM | POA: Diagnosis not present

## 2014-12-31 DIAGNOSIS — E669 Obesity, unspecified: Secondary | ICD-10-CM | POA: Insufficient documentation

## 2014-12-31 DIAGNOSIS — M542 Cervicalgia: Secondary | ICD-10-CM | POA: Diagnosis not present

## 2014-12-31 DIAGNOSIS — G250 Essential tremor: Secondary | ICD-10-CM | POA: Insufficient documentation

## 2014-12-31 DIAGNOSIS — R413 Other amnesia: Secondary | ICD-10-CM | POA: Diagnosis not present

## 2014-12-31 DIAGNOSIS — E785 Hyperlipidemia, unspecified: Secondary | ICD-10-CM | POA: Diagnosis not present

## 2014-12-31 NOTE — Therapy (Signed)
[facility] [address] [place], [place], [address] Phone: [phone]   Fax:  [phone]  Physical Therapy Evaluation  Patient Details  Name: Debbie [name] MRN: [id number] Date of Birth: [birth year] Referring Provider:  Kathrynn Ducking, MD  Encounter Date: 12/31/2014      PT End of Session - 12/31/14 1529    Visit Number 1   Number of Visits --  10- Medicare   Date for PT Re-Evaluation 02/18/15   PT Start Time 1450   PT Stop Time 1533   PT Time Calculation (min) 43 min   Activity Tolerance Patient tolerated treatment well   Behavior During Therapy [facility] for tasks assessed/performed      Past Medical History  Diagnosis Date  . Hypertension   . Atrial fibrillation   . Depression with anxiety   . Fibromyalgia   . Breast cancer   . Muscle tension headache   . History of partial seizures   . Benign essential tremor   . Obesity   . Drowsiness     Excessive daytime  . Obstructive sleep apnea   . Vitamin B12 deficiency   . Tremors of nervous system   . Dyslipidemia   . Chronic renal insufficiency   . Seizures     Partial  . Memory disturbance   . Sprain of neck 06/26/2013    Past Surgical History  Procedure Laterality Date  . Brain meningioma excision Right   . Mastectomies Bilateral   . Abdominal hysterectomy    . Breast reconstructive    . Breast reconstruction Bilateral   . Lumbosacral    . Lumbar spine surgery N/A   . Tubal ligation N/A   . Gallbladder resection    . Cholecystectomy    . Basal cell carcinoma excision    . Basal cell carcinoma excision    . Nose surgery      Basal cell carcinoma resection from the nose  . Dilution    . Dilation and curettage of uterus    . Ablation procedure      Cardiac, for Afib    There were no vitals taken for this visit.  Visit Diagnosis:  Weakness generalized - Plan: PT plan of care cert/re-cert  Neck pain - Plan: PT plan of care  cert/re-cert  Bilateral low back pain without sciatica - Plan: PT plan of care cert/re-cert  Activity intolerance - Plan: PT plan of care cert/re-cert      Subjective Assessment - 12/31/14 1456    Symptoms Pt presents to PT with complaints of low back and neck pain of a chronic nature.  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     Pertinent History 2 lumbar surgeries.     Limitations Sitting;Standing;Walking   How long can you sit comfortably? 2 hours   How long can you stand comfortably? 15 minutes max   How long can you walk comfortably? use of cane or walker- 30 minutes   Diagnostic tests April 2015- MRI per pt report and pt reports degeneration   Currently in Pain? Yes   Pain Score 9    Pain Location Back   Pain Orientation Lower;Left;Right   Pain Descriptors / Indicators Aching   Pain Type Chronic pain   Pain Onset More than a month ago   Pain Frequency Constant   Aggravating Factors  standing and walking, housework and ADLs   Pain Relieving Factors sitting, pain medication   Multiple Pain  Sites Yes   Pain Score 8  0-8/10   Pain Type Chronic pain   Pain Location Neck   Pain Orientation Right;Left   Pain Frequency Intermittent  worse with riding the the car and with reading          [facility] PT Assessment - 12/31/14 0001    Assessment   Medical Diagnosis LBP with sciatica (M54.5), sprain of neck (S13.9XXs)   Onset Date --  chronic   Next MD Visit 05/13/2015   Precautions   Precautions Fall;Other (comment)  Supine=pain, no [place] due to history of cancer   Balance Screen   Has the patient fallen in the past 6 months No   Has the patient had a decrease in activity level because of a fear of falling?  No   Is the patient reluctant to leave their home because of a fear of falling?  No   Home Environment   Living Enviornment Private residence   Living Arrangements Spouse/significant other   Home Layout One level   Prior Function   Level of [place] device for independence   Vocation Retired   Associate Professor   Overall Cognitive Status Within Functional Limits for tasks assessed   Observation/Other Assessments   Focus on Therapeutic Outcomes (FOTO)  73% limitation   Posture/Postural Control   Posture/Postural Control Postural limitations   Postural Limitations Rounded Shoulders;Forward head   ROM / Strength   AROM / PROM / Strength AROM;PROM;Strength   AROM   Overall AROM  Deficits   Overall AROM Comments All AROM with pain at end range of each motion   AROM Assessment Site Cervical;Lumbar   Cervical Flexion full   pain   Cervical - Right Side Bend full  pain   Cervical - Left Side Bend full  pain   Lumbar Flexion Lumbar AROM not formally tested due to balance deficits in standing   Strength   Overall Strength Deficits   Overall Strength Comments UE: 4/5 with pain upon testing, LE strength 4+/5 throughout   Palpation   Palpation Gross palpable tenderness over cervical parapsinals, UT and lumbar paraspinals   Transfers   Transfers Sit to Stand   Sit to Stand 6: Modified independent (Device/Increase time)   Ambulation/Gait   Ambulation/Gait Yes   Ambulation/Gait Assistance 6: Modified independent (Device/Increase time)                  [facility] Adult PT Treatment/Exercise - 12/31/14 0001    Exercises   Exercises Neck;Knee/Hip   Neck Exercises: Seated   Cervical Rotation Left;Both;Other reps (comment)  3x20 seconds   Lateral Flexion Both;Other reps (comment)  3x20 seconds   Other Seated Exercise cervical flexion 3x20 seconds   Knee/Hip Exercises: Seated   Long Arc Quad Strengthening;Both;1 set   Other Seated Knee Exercises heel/toe raises x 20                PT Education - 12/31/14 1543    Education provided Yes   Education Details HEP-cervical AROM, LAQ and heel/toe raises   Person(s) Educated Patient;Spouse   Methods Explanation;Demonstration;Verbal cues;Handout   Comprehension  Verbalized understanding;Returned demonstration;Verbal cues required          PT Short Term Goals - 12/31/14 1539    PT SHORT TERM GOAL #1   Title be independent in initial HEP   Period Weeks   Status New   PT SHORT TERM GOAL #2   Title report a 25% reduction in neck and  LBP with ADLs and self-care   Time 4   Period Weeks   Status New           PT Long Term Goals - 2015/01/06 1448    PT LONG TERM GOAL #1   Title be independent in an advanced HEP   Time 8   Period Weeks   Status New   PT LONG TERM GOAL #2   Title reduce FOTO to < or = to 56% limitation   Time 8   Period Weeks   Status New   PT LONG TERM GOAL #3   Title report a 40% reduction in neck and LBP with ADLs and self-care   Time 8   Period Weeks   Status New   PT LONG TERM GOAL #4   Title stand for 20 minutes for ADLs and housework without need to rest   Time 8   Period Weeks   Status New               Plan - 01-06-15 1544    Clinical Impression Statement Pt presents to PT with chronic LBP and neck pain resulting in impaired and limited mobility.  Pt will benefit from PT for guidance in gentle mobility and education regarding body mechanics and pain management.   Pt will benefit from skilled therapeutic intervention in order to improve on the following deficits Difficulty walking;Improper body mechanics;Decreased endurance;Decreased activity tolerance;Pain;Decreased strength;Decreased mobility   Rehab Potential Good   PT Frequency 2x / week   PT Duration 8 weeks   PT Treatment/Interventions ADLs/Self Care Home Management;Moist Heat;Therapeutic activities;Patient/family education;Therapeutic exercise;Neuromuscular re-education;Cryotherapy;Electrical Stimulation          G-Codes - 01/06/15 1447    Functional Assessment Tool Used FOTO   Functional Limitation Other PT primary   Other PT Primary Current Status (S8546) At least 60 percent but less than 80 percent impaired, limited or restricted    Other PT Primary Goal Status (E7035) At least 40 percent but less than 60 percent impaired, limited or restricted       Problem List Patient Active Problem List   Diagnosis Date Noted  . Sprain of neck 06/26/2013  . Obstructive sleep apnea (adult) (pediatric) 02/04/2013  . Atrial fibrillation 02/04/2013  . Other general symptoms 02/04/2013  . Other vitamin B12 deficiency anemia 02/04/2013  . Memory loss 02/04/2013  . Lumbago 02/04/2013    Mycala Warshawsky, PT 01/06/15, 3:50 PM  [facility] [address] [place], [place], [address] Phone: [phone]   Fax:  [phone]

## 2014-12-31 NOTE — Patient Instructions (Signed)
AROM: Neck Flexion   Bend head forward. Hold __20__ seconds. Repeat ___3_ times per set. Do _1___ sets per session. Do __2-3__ sessions per day.  http://orth.exer.us/299   Copyright  VHI. All rights reserved.  AROM: Lateral Neck Flexion   Slowly tilt head toward one shoulder, then the other. Hold each position __20__ seconds. Repeat __3__ times per set. Do _1___ sets per session. Do __2-3__ sessions per day.  http://orth.exer.us/297   Copyright  VHI. All rights reserved.  AROM, Rotation   Sit or stand, head comfortable, centered position. Turn head slowly to look over one shoulder. Hold _20__ seconds. Repeat to other side. Repeat _3__ times per session. Do _2-3KNEE: Extension, Long Arc Quads - Sitting   Raise leg until knee is straight. _10__ reps per set, __1-2_ sets per day, _5__ days per week   Sit and raise toes and then heels- repeat 20 times 3x/day

## 2015-01-05 ENCOUNTER — Encounter: Payer: Self-pay | Admitting: Physical Therapy

## 2015-01-05 ENCOUNTER — Ambulatory Visit: Payer: Medicare Other | Admitting: Physical Therapy

## 2015-01-05 DIAGNOSIS — R531 Weakness: Secondary | ICD-10-CM | POA: Diagnosis not present

## 2015-01-05 DIAGNOSIS — M545 Low back pain, unspecified: Secondary | ICD-10-CM

## 2015-01-05 DIAGNOSIS — R6889 Other general symptoms and signs: Secondary | ICD-10-CM | POA: Diagnosis not present

## 2015-01-05 DIAGNOSIS — M542 Cervicalgia: Secondary | ICD-10-CM | POA: Diagnosis not present

## 2015-01-05 DIAGNOSIS — I4891 Unspecified atrial fibrillation: Secondary | ICD-10-CM | POA: Diagnosis not present

## 2015-01-05 DIAGNOSIS — M797 Fibromyalgia: Secondary | ICD-10-CM | POA: Diagnosis not present

## 2015-01-05 NOTE — Therapy (Signed)
[facility] [address], [place], [place], [address] Phone: [phone]   Fax:  [phone]  Physical Therapy Treatment  Patient Details  Name: Debbie [name] MRN: [id number] Date of Birth: [birth year] Referring Provider:  Elba Barman, MD  Encounter Date: 01/05/2015      PT End of Session - 01/05/15 1655    Visit Number 2   Date for PT Re-Evaluation 02/18/15   PT Start Time 6073   PT Stop Time 1528   PT Time Calculation (min) 43 min   Activity Tolerance Patient tolerated treatment well   Behavior During Therapy [facility] for tasks assessed/performed      Past Medical History  Diagnosis Date  . Hypertension   . Atrial fibrillation   . Depression with anxiety   . Fibromyalgia   . Breast cancer   . Muscle tension headache   . History of partial seizures   . Benign essential tremor   . Obesity   . Drowsiness     Excessive daytime  . Obstructive sleep apnea   . Vitamin B12 deficiency   . Tremors of nervous system   . Dyslipidemia   . Chronic renal insufficiency   . Seizures     Partial  . Memory disturbance   . Sprain of neck 06/26/2013    Past Surgical History  Procedure Laterality Date  . Brain meningioma excision Right   . Mastectomies Bilateral   . Abdominal hysterectomy    . Breast reconstructive    . Breast reconstruction Bilateral   . Lumbosacral    . Lumbar spine surgery N/A   . Tubal ligation N/A   . Gallbladder resection    . Cholecystectomy    . Basal cell carcinoma excision    . Basal cell carcinoma excision    . Nose surgery      Basal cell carcinoma resection from the nose  . Dilution    . Dilation and curettage of uterus    . Ablation procedure      Cardiac, for Afib    There were no vitals taken for this visit.  Visit Diagnosis:  Weakness generalized  Bilateral low back pain without sciatica  Activity intolerance      Subjective Assessment - 01/05/15 1456    Symptoms Pt reports incraesed pain in thoracic area today, including back and neck   Pertinent History 2 lumbar surgeries.     Limitations Sitting;Standing;Walking   How long can you sit comfortably? 2 hours   How long can you stand comfortably? 15 minutes max   How long can you walk comfortably? use of cane or walker- 30 minutes   Diagnostic tests April 2015- MRI per pt report and pt reports degeneration   Currently in Pain? Yes   Pain Score 7    Pain Location Chest   Pain Orientation Other (Comment)  from ant to posterior, from alt to medial     Pain Descriptors / Indicators Aching;Sharp   Pain Type Chronic pain   Pain Onset More than a month ago   Pain Frequency Constant   Aggravating Factors  standing and walking, housework and ADL's   Pain Relieving Factors sitting, pain medication   Effect of Pain on Daily Activities very limited wiuth daily activities   Multiple Pain Sites Yes   Pain Location Neck   Pain Orientation Right;Left   Pain Frequency Intermittent   Pain Onset Awakened from sleep  [facility] Adult PT Treatment/Exercise - 01/05/15 0001    Transfers   Transfers Sit to Stand  practiced x 10 from chair   Exercises   Exercises Shoulder;Knee/Hip;Lumbar  Diaphragmatic breathing 3 x 5 to release muscle tightness   Knee/Hip Exercises: Standing   Hip ADduction AROM;2 sets   Other Standing Knee Exercises halfsquatting at sink x 5   Other Standing Knee Exercises hip extension in standing 2 x 10   Knee/Hip Exercises: Seated   Long Arc Quad Strengthening;Both;AROM;3 sets   Other Seated Knee Exercises heel/toe raises x 20   Other Seated Knee Exercises marching 3 x 10   Shoulder Exercises: Pulleys   Flexion 2 minutes  x 2, pt fatique after task                PT Education - 01/05/15 1659    Education provided Yes   Education Details traansfer sit to [employer]) Educated Patient   Methods Explanation;Demonstration;Handout    Comprehension Returned demonstration          PT Short Term Goals - 12/31/14 1539    PT SHORT TERM GOAL #1   Title be independent in initial HEP   Period Weeks   Status New   PT SHORT TERM GOAL #2   Title report a 25% reduction in neck and LBP with ADLs and self-care   Time 4   Period Weeks   Status New           PT Long Term Goals - 12/31/14 1448    PT LONG TERM GOAL #1   Title be independent in an advanced HEP   Time 8   Period Weeks   Status New   PT LONG TERM GOAL #2   Title reduce FOTO to < or = to 56% limitation   Time 8   Period Weeks   Status New   PT LONG TERM GOAL #3   Title report a 40% reduction in neck and LBP with ADLs and self-care   Time 8   Period Weeks   Status New   PT LONG TERM GOAL #4   Title stand for 20 minutes for ADLs and housework without need to rest   Time 8   Period Weeks   Status New               Plan - 01/05/15 1656    Clinical Impression Statement Pt. presents with low endurance, decreased stamina, chroinc low back & neck pain, all functional actibvities of daily life are limited   Pt will benefit from skilled therapeutic intervention in order to improve on the following deficits Difficulty walking;Improper body mechanics;Decreased endurance;Decreased activity tolerance;Pain;Decreased strength;Decreased mobility   Rehab Potential Good   PT Frequency 2x / week   PT Duration 8 weeks   PT Treatment/Interventions ADLs/Self Care Home Management;Moist Heat;Therapeutic activities;Patient/family education;Therapeutic exercise;Neuromuscular re-education;Cryotherapy;Electrical Stimulation   PT Next Visit Plan continue to improve enduance, strength, functional activities    PT Home Exercise Plan standing  strengthening, hip abd and extension   Recommended Other Services none   Consulted and Agree with Plan of Care Patient        Problem List Patient Active Problem List   Diagnosis Date Noted  . Sprain of neck 06/26/2013  .  Obstructive sleep apnea (adult) (pediatric) 02/04/2013  . Atrial fibrillation 02/04/2013  . Other general symptoms 02/04/2013  . Other vitamin B12 deficiency anemia 02/04/2013  . Memory loss 02/04/2013  . Lumbago 02/04/2013  NAUMANN-HOUEGNIFIO,Brie Eppard PTA  01/05/2015, 5:02 PM  [facility] [address], [place], [place], [address] Phone: [phone]   Fax:  [phone]

## 2015-01-05 NOTE — Patient Instructions (Signed)
.  diaphragmaSit to Stand Transfers:  1. Scoot out to the edge of the chair 2. Place your feet flat on the floor, shoulder width apart.  Make sure your feet are tucked just under your knees. 3. Lean forward (nose over toes) with momentum, and stand up tall with your best posture.  If you need to use your arms, use them as a quick boost up to stand. 4. If you are in a low or soft chair, you can lean back and then forward up to stand, in order to get more momentum. 5. Once you are standing, make sure you are looking ahead and standing tall.  To sit down:  1. Back up until you feel the chair behind your legs. Bend at you hips, reaching  Back for you chair, if needed, then slowly squat to sit down on your chair.

## 2015-01-07 ENCOUNTER — Encounter: Payer: Self-pay | Admitting: Physical Therapy

## 2015-01-07 ENCOUNTER — Ambulatory Visit: Payer: Medicare Other | Attending: Neurology | Admitting: Physical Therapy

## 2015-01-07 DIAGNOSIS — E669 Obesity, unspecified: Secondary | ICD-10-CM | POA: Insufficient documentation

## 2015-01-07 DIAGNOSIS — M797 Fibromyalgia: Secondary | ICD-10-CM | POA: Insufficient documentation

## 2015-01-07 DIAGNOSIS — M545 Low back pain: Secondary | ICD-10-CM | POA: Diagnosis not present

## 2015-01-07 DIAGNOSIS — N189 Chronic kidney disease, unspecified: Secondary | ICD-10-CM | POA: Insufficient documentation

## 2015-01-07 DIAGNOSIS — M542 Cervicalgia: Secondary | ICD-10-CM | POA: Insufficient documentation

## 2015-01-07 DIAGNOSIS — I129 Hypertensive chronic kidney disease with stage 1 through stage 4 chronic kidney disease, or unspecified chronic kidney disease: Secondary | ICD-10-CM | POA: Insufficient documentation

## 2015-01-07 DIAGNOSIS — R6889 Other general symptoms and signs: Secondary | ICD-10-CM | POA: Diagnosis not present

## 2015-01-07 DIAGNOSIS — R531 Weakness: Secondary | ICD-10-CM | POA: Diagnosis not present

## 2015-01-07 DIAGNOSIS — R413 Other amnesia: Secondary | ICD-10-CM | POA: Insufficient documentation

## 2015-01-07 DIAGNOSIS — E785 Hyperlipidemia, unspecified: Secondary | ICD-10-CM | POA: Diagnosis not present

## 2015-01-07 DIAGNOSIS — I4891 Unspecified atrial fibrillation: Secondary | ICD-10-CM | POA: Insufficient documentation

## 2015-01-07 DIAGNOSIS — G250 Essential tremor: Secondary | ICD-10-CM | POA: Insufficient documentation

## 2015-01-07 DIAGNOSIS — G4733 Obstructive sleep apnea (adult) (pediatric): Secondary | ICD-10-CM | POA: Diagnosis not present

## 2015-01-07 NOTE — Therapy (Signed)
[facility] [address], [place], [place], [address] Phone: [phone]   Fax:  [phone]  Physical Therapy Treatment  Patient Details  Name: Debbie [name] MRN: [id number] Date of Birth: Jan 10, 1946 Referring Provider:  Elba Barman, MD  Encounter Date: 01/07/2015      PT End of Session - 01/07/15 1433    Visit Number 3   Date for PT Re-Evaluation 02/18/15   PT Start Time 6962   PT Stop Time 1435   PT Time Calculation (min) 40 min   Activity Tolerance Patient tolerated treatment well   Behavior During Therapy [facility] for tasks assessed/performed      Past Medical History  Diagnosis Date  . Hypertension   . Atrial fibrillation   . Depression with anxiety   . Fibromyalgia   . Breast cancer   . Muscle tension headache   . History of partial seizures   . Benign essential tremor   . Obesity   . Drowsiness     Excessive daytime  . Obstructive sleep apnea   . Vitamin B12 deficiency   . Tremors of nervous system   . Dyslipidemia   . Chronic renal insufficiency   . Seizures     Partial  . Memory disturbance   . Sprain of neck 06/26/2013    Past Surgical History  Procedure Laterality Date  . Brain meningioma excision Right   . Mastectomies Bilateral   . Abdominal hysterectomy    . Breast reconstructive    . Breast reconstruction Bilateral   . Lumbosacral    . Lumbar spine surgery N/A   . Tubal ligation N/A   . Gallbladder resection    . Cholecystectomy    . Basal cell carcinoma excision    . Basal cell carcinoma excision    . Nose surgery      Basal cell carcinoma resection from the nose  . Dilution    . Dilation and curettage of uterus    . Ablation procedure      Cardiac, for Afib    There were no vitals taken for this visit.  Visit Diagnosis:  Activity intolerance  Weakness generalized      Subjective Assessment - 01/07/15 1401    Symptoms Near fall this AM. Foot slipped on  hardwoods but she caught herself. Feels sore from the jerking. Does not feel like anything was damaged.    Pertinent History 2 lumbar surgeries.     Limitations Sitting;Standing;Walking   How long can you sit comfortably? 2 hours   How long can you stand comfortably? 15 minutes max   How long can you walk comfortably? use of cane or walker- 30 minutes   Diagnostic tests April 2015- MRI per pt report and pt reports degeneration   Currently in Pain? Yes   Pain Score 5    Pain Location Neck   Pain Orientation Right   Pain Descriptors / Indicators Aching   Pain Type Chronic pain   Pain Onset More than a month ago   Aggravating Factors  Overdoing activity   Pain Relieving Factors Pain meds   Multiple Pain Sites Yes   Pain Score 6   Pain Type Chronic pain   Pain Location Back   Pain Orientation Lower   Pain Descriptors / Indicators Constant   Pain Frequency Intermittent   Pain Onset Awakened from sleep  [facility] Adult PT Treatment/Exercise - 01/07/15 0001    Neck Exercises: Seated   Cervical Rotation Both;5 reps   Lateral Flexion Both  Stretches 3x 20 seconds each   Knee/Hip Exercises: Standing   Hip ADduction Strengthening;Both;1 set;5 sets   Other Standing Knee Exercises Sit to stand 5x: vc to use LTLE more.   Other Standing Knee Exercises Alternating taps with lower abs in 10x2   Knee/Hip Exercises: Seated   Long Arc Quad --  RT 2x5, LT 2x3   Other Seated Knee Exercises heel/toe raises x 20  Did rocker board x 3 min today   Other Seated Knee Exercises marching 3 x 10  Verbal cues to engage TA during  marching.   Shoulder Exercises: Pulleys   Flexion 3 minutes                PT Education - 01/07/15 1432    Education provided Yes   Education Details How to engag TA muscle during ADLS   Person(s) Educated Patient   Methods Explanation;Demonstration   Comprehension Verbalized understanding;Returned demonstration          PT Short  Term Goals - 01/07/15 1413    PT SHORT TERM GOAL #1   Title be independent in initial HEP   Time 4   Period Weeks   Status On-going  Patient reports doing mainly neck exs.   PT SHORT TERM GOAL #2   Title report a 25% reduction in neck and LBP with ADLs and self-care   Time 4   Period Weeks   Status On-going  Too soon           PT Long Term Goals - 01/07/15 1414    PT LONG TERM GOAL #1   Title be independent in an advanced HEP   Time 8   Period Weeks   Status On-going   PT LONG TERM GOAL #2   Title reduce FOTO to < or = to 56% limitation   Time 8   Period Weeks   Status On-going  will do at visit 10   PT LONG TERM GOAL #3   Title report a 40% reduction in neck and LBP with ADLs and self-care   Time 8   Period Weeks   Status On-going   PT LONG TERM GOAL #4   Title stand for 20 minutes for ADLs and housework without need to rest   Time 8   Period Weeks   Status On-going  15 min reported.               Plan - 01/07/15 1435    Clinical Impression Statement Patient able to tolerate more activity in therapy today. Pain still primary limiting factor.   Pt will benefit from skilled therapeutic intervention in order to improve on the following deficits Difficulty walking;Improper body mechanics;Decreased endurance;Decreased activity tolerance;Pain;Decreased strength;Decreased mobility   Rehab Potential Good   PT Frequency 2x / week   PT Duration 8 weeks   PT Treatment/Interventions ADLs/Self Care Home Management;Moist Heat;Therapeutic activities;Patient/family education;Therapeutic exercise;Neuromuscular re-education;Cryotherapy;Electrical Stimulation   PT Next Visit Plan continue to improve enduance, strength, functional activities    Consulted and Agree with Plan of Care Patient        Problem List Patient Active Problem List   Diagnosis Date Noted  . Sprain of neck 06/26/2013  . Obstructive sleep apnea (adult) (pediatric) 02/04/2013  . Atrial  fibrillation 02/04/2013  . Other general symptoms 02/04/2013  . Other vitamin B12 deficiency anemia  02/04/2013  . Memory loss 02/04/2013  . Lumbago 02/04/2013    Makyiah Lie, PTA 01/07/2015, 2:37 PM  [facility] [address] [place], [place], [address] Phone: [phone]   Fax:  [phone]

## 2015-01-14 ENCOUNTER — Ambulatory Visit: Payer: Medicare Other

## 2015-01-14 DIAGNOSIS — M542 Cervicalgia: Secondary | ICD-10-CM | POA: Diagnosis not present

## 2015-01-14 DIAGNOSIS — M545 Low back pain, unspecified: Secondary | ICD-10-CM

## 2015-01-14 DIAGNOSIS — R6889 Other general symptoms and signs: Secondary | ICD-10-CM

## 2015-01-14 DIAGNOSIS — I4891 Unspecified atrial fibrillation: Secondary | ICD-10-CM | POA: Diagnosis not present

## 2015-01-14 DIAGNOSIS — R531 Weakness: Secondary | ICD-10-CM

## 2015-01-14 DIAGNOSIS — M797 Fibromyalgia: Secondary | ICD-10-CM | POA: Diagnosis not present

## 2015-01-14 NOTE — Patient Instructions (Addendum)
High Stepping in Place (Sitting)   Sitting, alternately lift knees as high as you are able. Keep torso erect. Repeat 10____ times, each leg. Repeat 3-5 times each day.  Copyright  VHI. All rights reserved.  Strengthening: Hip Adduction - Isometric  Sit in a chair to do this  With ball or folded pillow between knees, squeeze knees together. Hold __5__ seconds. Repeat 10____ times per set. Do _1-2___ sets per session. Do _4-5 times a day.  http://orth.exer.us/613   Copyright  VHI. All rights reserved.

## 2015-01-14 NOTE — Therapy (Signed)
[facility] [address], [place], [place], [address] Phone: [phone]   Fax:  [phone]  Physical Therapy Treatment  Patient Details  Name: Debbie [name] MRN: [id number] Date of Birth: 1945-11-28 Referring Provider:  Kathrynn Ducking, MD  Encounter Date: 01/14/2015      PT End of Session - 01/14/15 1437    Visit Number 4   Number of Visits 10  Medicare   Date for PT Re-Evaluation 02/18/15   PT Start Time 0511   PT Stop Time 1438   PT Time Calculation (min) 40 min   Activity Tolerance Patient tolerated treatment well   Behavior During Therapy [facility] for tasks assessed/performed      Past Medical History  Diagnosis Date  . Hypertension   . Atrial fibrillation   . Depression with anxiety   . Fibromyalgia   . Breast cancer   . Muscle tension headache   . History of partial seizures   . Benign essential tremor   . Obesity   . Drowsiness     Excessive daytime  . Obstructive sleep apnea   . Vitamin B12 deficiency   . Tremors of nervous system   . Dyslipidemia   . Chronic renal insufficiency   . Seizures     Partial  . Memory disturbance   . Sprain of neck 06/26/2013    Past Surgical History  Procedure Laterality Date  . Brain meningioma excision Right   . Mastectomies Bilateral   . Abdominal hysterectomy    . Breast reconstructive    . Breast reconstruction Bilateral   . Lumbosacral    . Lumbar spine surgery N/A   . Tubal ligation N/A   . Gallbladder resection    . Cholecystectomy    . Basal cell carcinoma excision    . Basal cell carcinoma excision    . Nose surgery      Basal cell carcinoma resection from the nose  . Dilution    . Dilation and curettage of uterus    . Ablation procedure      Cardiac, for Afib    There were no vitals taken for this visit.  Visit Diagnosis:  Activity intolerance  Weakness generalized  Bilateral low back pain without sciatica  Neck pain       Subjective Assessment - 01/14/15 1356    Symptoms Pt reports that she has had neck pain s/p near fall last week.  Able to take a shower without getting out of breath.   Currently in Pain? Yes   Pain Score 6    Pain Location Neck   Pain Orientation Right;Left   Pain Descriptors / Indicators Aching   Pain Type Chronic pain   Pain Onset More than a month ago   Aggravating Factors  turning head, turning over in bed   Pain Relieving Factors rest, pain medication          [facility] PT Assessment - 01/14/15 0001    Assessment   Medical Diagnosis LBP with sciatica (M54.5), sprain of neck (M21.1ZNB)   Next MD Visit 05/13/2015                  [facility] Adult PT Treatment/Exercise - 01/14/15 0001    Neck Exercises: Seated   Cervical Rotation Both;5 reps   Lateral Flexion Both  Stretches 3x 20 seconds each   Lumbar Exercises: Stretches   Lower Trunk Rotation 3 reps;10 seconds  gentle with limited ROM   Lumbar Exercises:  Seated   Other Seated Lumbar Exercises hip adduction with ball 5 sec hold 2x10  added to HEP   Lumbar Exercises: Supine   Other Supine Lumbar Exercises decompression with knees bend and arms facing upx 3 minutes   Knee/Hip Exercises: Seated   Long Arc Quad Strengthening;Both;1 set  fatigue and pain with seated activity   Other Seated Knee Exercises rockerboard PF/DF x 3 minutes   Other Seated Knee Exercises marching 3 x 10  Verbal cues to engage TA during  marching.   Shoulder Exercises: Seated   Elevation AROM;Strengthening;Both;20 reps  forward raise to height of the shoulders   Shoulder Exercises: Pulleys   Flexion 3 minutes  PT present to discuss progress   Shoulder Exercises: ROM/Strengthening   UBE (Upper Arm Bike) Level 0 seated x 3 minutes                PT Education - 01/14/15 1428    Education provided Yes   Education Details HEP- seated marching and adduction with ball   Person(s) Educated Patient   Methods  Explanation;Demonstration;Handout   Comprehension Verbalized understanding;Returned demonstration          PT Short Term Goals - 01/14/15 1405    PT SHORT TERM GOAL #1   Title be independent in initial HEP   Time 4   Status Achieved   PT SHORT TERM GOAL #2   Title report a 25% reduction in neck and LBP with ADLs and self-care   Time 4   Period Weeks   Status Achieved  25% improvement reported           PT Long Term Goals - 01/14/15 1406    PT LONG TERM GOAL #4   Title stand for 20 minutes for ADLs and housework without need to rest   Time 8   Period Weeks   Status Achieved  some pain but able to stand 30-40 minutes               Plan - 01/14/15 1419    Clinical Impression Statement Pt with limited mobility and difficutly with supine exercise today secondary to pain.  Pt is able to do more each visit.   Pt will benefit from skilled therapeutic intervention in order to improve on the following deficits Difficulty walking;Improper body mechanics;Decreased endurance;Decreased activity tolerance;Pain;Decreased strength;Decreased mobility   Rehab Potential Good   PT Frequency 2x / week   PT Duration 8 weeks   PT Treatment/Interventions ADLs/Self Care Home Management;Moist Heat;Therapeutic activities;Patient/family education;Therapeutic exercise;Neuromuscular re-education;Cryotherapy;Electrical Stimulation   PT Next Visit Plan continue to improve enduance, strength, functional activities    PT Home Exercise Plan review new HEP   Consulted and Agree with Plan of Care Patient        Problem List Patient Active Problem List   Diagnosis Date Noted  . Sprain of neck 06/26/2013  . Obstructive sleep apnea (adult) (pediatric) 02/04/2013  . Atrial fibrillation 02/04/2013  . Other general symptoms 02/04/2013  . Other vitamin B12 deficiency anemia 02/04/2013  . Memory loss 02/04/2013  . Lumbago 02/04/2013    Geneviene Tesch, PT 01/14/2015, 2:39 PM  [facility] [address] [place], [place], [address] Phone: [phone]   Fax:  [phone]

## 2015-01-15 ENCOUNTER — Encounter: Payer: Self-pay | Admitting: Physical Therapy

## 2015-01-15 ENCOUNTER — Ambulatory Visit: Payer: Medicare Other | Admitting: Physical Therapy

## 2015-01-15 DIAGNOSIS — R6889 Other general symptoms and signs: Secondary | ICD-10-CM | POA: Diagnosis not present

## 2015-01-15 DIAGNOSIS — M545 Low back pain, unspecified: Secondary | ICD-10-CM

## 2015-01-15 DIAGNOSIS — M797 Fibromyalgia: Secondary | ICD-10-CM | POA: Diagnosis not present

## 2015-01-15 DIAGNOSIS — M542 Cervicalgia: Secondary | ICD-10-CM | POA: Diagnosis not present

## 2015-01-15 DIAGNOSIS — I4891 Unspecified atrial fibrillation: Secondary | ICD-10-CM | POA: Diagnosis not present

## 2015-01-15 DIAGNOSIS — R531 Weakness: Secondary | ICD-10-CM

## 2015-01-15 NOTE — Therapy (Signed)
[facility] [address] [place], [place], [address] Phone: [phone]   Fax:  [phone]  Physical Therapy Treatment  Patient Details  Name: Debbie [name] MRN: [id number] Date of Birth: Aug 07, 1946 Referring Provider:  Kathrynn Ducking, MD  Encounter Date: 01/15/2015      PT End of Session - 01/14/15 1437    Visit Number 4   Number of Visits 10  Medicare   Date for PT Re-Evaluation 02/18/15   PT Start Time 7322   PT Stop Time 1438   PT Time Calculation (min) 40 min   Activity Tolerance Patient tolerated treatment well   Behavior During Therapy [facility] for tasks assessed/performed      Past Medical History  Diagnosis Date  . Hypertension   . Atrial fibrillation   . Depression with anxiety   . Fibromyalgia   . Breast cancer   . Muscle tension headache   . History of partial seizures   . Benign essential tremor   . Obesity   . Drowsiness     Excessive daytime  . Obstructive sleep apnea   . Vitamin B12 deficiency   . Tremors of nervous system   . Dyslipidemia   . Chronic renal insufficiency   . Seizures     Partial  . Memory disturbance   . Sprain of neck 06/26/2013    Past Surgical History  Procedure Laterality Date  . Brain meningioma excision Right   . Mastectomies Bilateral   . Abdominal hysterectomy    . Breast reconstructive    . Breast reconstruction Bilateral   . Lumbosacral    . Lumbar spine surgery N/A   . Tubal ligation N/A   . Gallbladder resection    . Cholecystectomy    . Basal cell carcinoma excision    . Basal cell carcinoma excision    . Nose surgery      Basal cell carcinoma resection from the nose  . Dilution    . Dilation and curettage of uterus    . Ablation procedure      Cardiac, for Afib    There were no vitals taken for this visit.  Visit Diagnosis:  Activity intolerance  Weakness generalized  Bilateral low back pain without sciatica  Neck pain       Subjective Assessment - 01/15/15 1112    Symptoms Pt still with pain in neck esspecially in the night since the fall last week    Pertinent History 2 lumbar surgeries.     Limitations Sitting;Standing;Walking   How long can you sit comfortably? 2 hours   How long can you stand comfortably? 15 minutes max   How long can you walk comfortably? use of cane or walker- 30 minutes   Diagnostic tests April 2015- MRI per pt report and pt reports degeneration   Currently in Pain? Yes   Pain Score 8   it is worst today   Pain Location Neck   Pain Orientation Right;Left   Pain Descriptors / Indicators Dull;Aching;Sharp  sharp pain with neck flexion   Pain Type Acute pain;Chronic pain   Pain Onset More than a month ago   Pain Frequency Constant   Aggravating Factors  turning head, turnig over in bed   Pain Relieving Factors rest, pain, medication   Effect of Pain on Daily Activities limited with daily activities   Multiple Pain Sites No  [facility] Adult PT Treatment/Exercise - 01/15/15 0001    Neck Exercises: Seated   Cervical Rotation Both;5 reps   Lateral Flexion Both  Stretches 3x 20 seconds each   Lumbar Exercises: Stretches   Lower Trunk Rotation 3 reps;10 seconds  gentle with limited ROM   Lumbar Exercises: Seated   Other Seated Lumbar Exercises hip adduction with ball 5 sec hold 2x10  added to HEP   Lumbar Exercises: Supine   Other Supine Lumbar Exercises decompression with knees bend and arms facing upx 3 minutes   Knee/Hip Exercises: Seated   Long Arc Quad Strengthening;Both;1 set  fatigue and pain with seated activity   Other Seated Knee Exercises rockerboard PF/DF x 3 minutes   Other Seated Knee Exercises marching 3 x 10  Verbal cues to engage TA during  marching.   Shoulder Exercises: Seated   Elevation AROM;Strengthening;Both;20 reps  forward raise to height of the shoulders   Shoulder Exercises: ROM/Strengthening   UBE (Upper Arm Bike) level  0 x 4   Modalities   Modalities Moist Heat  to neck in stiiting x 15 min                PT Education - 01/14/15 1428    Education provided Yes   Education Details HEP- seated marching and adduction with ball   Person(s) Educated Patient   Methods Explanation;Demonstration;Handout   Comprehension Verbalized understanding;Returned demonstration          PT Short Term Goals - 01/14/15 1405    PT SHORT TERM GOAL #1   Title be independent in initial HEP   Time 4   Status Achieved   PT SHORT TERM GOAL #2   Title report a 25% reduction in neck and LBP with ADLs and self-care   Time 4   Period Weeks   Status Achieved  25% improvement reported           PT Long Term Goals - 01/14/15 1406    PT LONG TERM GOAL #4   Title stand for 20 minutes for ADLs and housework without need to rest   Time 8   Period Weeks   Status Achieved  some pain but able to stand 30-40 minutes               Plan - 01/14/15 1419    Clinical Impression Statement Pt with limited mobility and difficutly with supine exercise today secondary to pain.  Pt is able to do more each visit.   Pt will benefit from skilled therapeutic intervention in order to improve on the following deficits Difficulty walking;Improper body mechanics;Decreased endurance;Decreased activity tolerance;Pain;Decreased strength;Decreased mobility   Rehab Potential Good   PT Frequency 2x / week   PT Duration 8 weeks   PT Treatment/Interventions ADLs/Self Care Home Management;Moist Heat;Therapeutic activities;Patient/family education;Therapeutic exercise;Neuromuscular re-education;Cryotherapy;Electrical Stimulation   PT Next Visit Plan continue to improve enduance, strength, functional activities    PT Home Exercise Plan review new HEP   Consulted and Agree with Plan of Care Patient        Problem List Patient Active Problem List   Diagnosis Date Noted  . Sprain of neck 06/26/2013  . Obstructive sleep apnea  (adult) (pediatric) 02/04/2013  . Atrial fibrillation 02/04/2013  . Other general symptoms 02/04/2013  . Other vitamin B12 deficiency anemia 02/04/2013  . Memory loss 02/04/2013  . Lumbago 02/04/2013    NAUMANN-HOUEGNIFIO,Darris Carachure PTA 01/15/2015, 11:45 AM  [facility] [address], [address]  [place], [place], [address] Phone: [phone]   Fax:  [phone]

## 2015-01-19 ENCOUNTER — Encounter: Payer: Self-pay | Admitting: Physical Therapy

## 2015-01-19 ENCOUNTER — Ambulatory Visit: Payer: Medicare Other | Admitting: Physical Therapy

## 2015-01-19 DIAGNOSIS — R531 Weakness: Secondary | ICD-10-CM

## 2015-01-19 DIAGNOSIS — I4891 Unspecified atrial fibrillation: Secondary | ICD-10-CM | POA: Diagnosis not present

## 2015-01-19 DIAGNOSIS — R6889 Other general symptoms and signs: Secondary | ICD-10-CM | POA: Diagnosis not present

## 2015-01-19 DIAGNOSIS — M545 Low back pain, unspecified: Secondary | ICD-10-CM

## 2015-01-19 DIAGNOSIS — M797 Fibromyalgia: Secondary | ICD-10-CM | POA: Diagnosis not present

## 2015-01-19 DIAGNOSIS — M542 Cervicalgia: Secondary | ICD-10-CM | POA: Diagnosis not present
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
## 2015-01-21 ENCOUNTER — Ambulatory Visit: Payer: Medicare Other | Admitting: Physical Therapy

## 2015-01-21 ENCOUNTER — Encounter: Payer: Self-pay | Admitting: Physical Therapy

## 2015-01-21 DIAGNOSIS — M545 Low back pain, unspecified: Secondary | ICD-10-CM

## 2015-01-21 DIAGNOSIS — I4891 Unspecified atrial fibrillation: Secondary | ICD-10-CM | POA: Diagnosis not present

## 2015-01-21 DIAGNOSIS — R6889 Other general symptoms and signs: Secondary | ICD-10-CM | POA: Diagnosis not present

## 2015-01-21 DIAGNOSIS — R531 Weakness: Secondary | ICD-10-CM

## 2015-01-21 DIAGNOSIS — M797 Fibromyalgia: Secondary | ICD-10-CM | POA: Diagnosis not present

## 2015-01-21 DIAGNOSIS — M542 Cervicalgia: Secondary | ICD-10-CM | POA: Diagnosis not present

## 2015-01-21 NOTE — Therapy (Signed)
[facility] [address] [place], [place], [address] Phone: [phone]   Fax:  [phone]  Physical Therapy Treatment  Patient Details  Name: Debbie [name] MRN: [id number] Date of Birth: 07/23/1946 Referring Provider:  Kathrynn Ducking, MD  Encounter Date: 01/21/2015      PT End of Session - 01/21/15 1435    Visit Number 6   Number of Visits 10   Date for PT Re-Evaluation 02/18/15   PT Start Time 1400   PT Stop Time 1445   PT Time Calculation (min) 45 min   Activity Tolerance Patient limited by pain   Behavior During Therapy Anxious      Past Medical History  Diagnosis Date  . Hypertension   . Atrial fibrillation   . Depression with anxiety   . Fibromyalgia   . Breast cancer   . Muscle tension headache   . History of partial seizures   . Benign essential tremor   . Obesity   . Drowsiness     Excessive daytime  . Obstructive sleep apnea   . Vitamin B12 deficiency   . Tremors of nervous system   . Dyslipidemia   . Chronic renal insufficiency   . Seizures     Partial  . Memory disturbance   . Sprain of neck 06/26/2013    Past Surgical History  Procedure Laterality Date  . Brain meningioma excision Right   . Mastectomies Bilateral   . Abdominal hysterectomy    . Breast reconstructive    . Breast reconstruction Bilateral   . Lumbosacral    . Lumbar spine surgery N/A   . Tubal ligation N/A   . Gallbladder resection    . Cholecystectomy    . Basal cell carcinoma excision    . Basal cell carcinoma excision    . Nose surgery      Basal cell carcinoma resection from the nose  . Dilution    . Dilation and curettage of uterus    . Ablation procedure      Cardiac, for Afib    There were no vitals filed for this visit.  Visit Diagnosis:  Activity intolerance  Bilateral low back pain without sciatica  Weakness generalized      Subjective Assessment - 01/21/15 1404    Symptoms  Feeling better in my legs, neck feels slightly better. Using neck pillow in car is helpful.    Currently in Pain? Yes   Pain Score 3    Pain Location Neck   Pain Descriptors / Indicators Aching   Pain Type Acute pain;Chronic pain   Aggravating Factors  Not supporting  my neck   Pain Relieving Factors rest, heat, doing exercises,    Multiple Pain Sites No                       [facility] Adult PT Treatment/Exercise - 01/21/15 0001    Neck Exercises: Seated   Cervical Rotation Both;10 reps   Shoulder Shrugs 10 reps   Shoulder Rolls 10 reps   Lumbar Exercises: Aerobic   Stationary Bike L0 x 6 min   Knee/Hip Exercises: Standing   Rocker Board --  Sitting x 3 min with abdominals in.    Other Standing Knee Exercises Sit to stand from low mat 10x    Knee/Hip Exercises: Seated   Long Arc Quad Both;1 set;10 reps   Long Arc Quad Weight 1 lbs.   Other Seated Knee Exercises Added  1# wts 10x Bil   Moist Heat Therapy   Number Minutes Moist Heat 15 Minutes   Moist Heat Location --  Neck   Electrical Stimulation   Electrical Stimulation Location Neck   Electrical Stimulation Action IFC   Electrical Stimulation Parameters 80-150 HZ   Electrical Stimulation Goals Pain                  PT Short Term Goals - 01/14/15 1405    PT SHORT TERM GOAL #1   Title be independent in initial HEP   Time 4   Status Achieved   PT SHORT TERM GOAL #2   Title report a 25% reduction in neck and LBP with ADLs and self-care   Time 4   Period Weeks   Status Achieved  25% improvement reported           PT Long Term Goals - 01/19/15 1429    PT LONG TERM GOAL #3   Title report a 40% reduction in neck and LBP with ADLs and self-care   Time 8   Period Weeks   Status On-going   PT LONG TERM GOAL #4   Title stand for 20 minutes for ADLs and housework without need to rest   Time 8   Period Weeks   Status Achieved               Plan - 01/21/15 1436    Clinical Impression  Statement Patient was tolerating TE well until shoulder cirlces which increased her neck pain significantly per patient. Legs tolerated weights today.   Pt will benefit from skilled therapeutic intervention in order to improve on the following deficits Difficulty walking;Improper body mechanics;Decreased endurance;Decreased activity tolerance;Pain;Decreased strength;Decreased mobility   Rehab Potential Good   PT Frequency 2x / week   PT Duration 8 weeks   PT Treatment/Interventions ADLs/Self Care Home Management;Moist Heat;Therapeutic activities;Patient/family education;Therapeutic exercise;Neuromuscular re-education;Cryotherapy;Electrical Stimulation   PT Next Visit Plan continue to improve enduance, strength, functional activities    Consulted and Agree with Plan of Care Patient        Problem List Patient Active Problem List   Diagnosis Date Noted  . Sprain of neck 06/26/2013  . Obstructive sleep apnea (adult) (pediatric) 02/04/2013  . Atrial fibrillation 02/04/2013  . Other general symptoms 02/04/2013  . Other vitamin B12 deficiency anemia 02/04/2013  . Memory loss 02/04/2013  . Lumbago 02/04/2013    Crislyn Willbanks, PTA 01/21/2015, 2:39 PM  [facility] [address] [place], [place], [address] Phone: [phone]   Fax:  [phone]

## 2015-01-26 ENCOUNTER — Ambulatory Visit: Payer: Medicare Other

## 2015-01-26 DIAGNOSIS — R6889 Other general symptoms and signs: Secondary | ICD-10-CM | POA: Diagnosis not present

## 2015-01-26 DIAGNOSIS — M545 Low back pain, unspecified: Secondary | ICD-10-CM

## 2015-01-26 DIAGNOSIS — M797 Fibromyalgia: Secondary | ICD-10-CM | POA: Diagnosis not present

## 2015-01-26 DIAGNOSIS — M542 Cervicalgia: Secondary | ICD-10-CM

## 2015-01-26 DIAGNOSIS — R531 Weakness: Secondary | ICD-10-CM | POA: Diagnosis not present

## 2015-01-26 DIAGNOSIS — I4891 Unspecified atrial fibrillation: Secondary | ICD-10-CM | POA: Diagnosis not present

## 2015-01-26 NOTE — Therapy (Signed)
[facility] [address] [place], [place], [address] Phone: [phone]   Fax:  [phone]  Physical Therapy Treatment  Patient Details  Name: Debbie [name] MRN: [id number] Date of Birth: June 09, 1946 Referring Provider:  Elba Barman, MD  Encounter Date: 01/26/2015      PT End of Session - 01/26/15 1435    Visit Number 7   Number of Visits 10   Date for PT Re-Evaluation 02/18/15   PT Start Time 1400   PT Stop Time 1456   PT Time Calculation (min) 56 min   Activity Tolerance Patient tolerated treatment well   Behavior During Therapy [facility] for tasks assessed/performed      Past Medical History  Diagnosis Date  . Hypertension   . Atrial fibrillation   . Depression with anxiety   . Fibromyalgia   . Breast cancer   . Muscle tension headache   . History of partial seizures   . Benign essential tremor   . Obesity   . Drowsiness     Excessive daytime  . Obstructive sleep apnea   . Vitamin B12 deficiency   . Tremors of nervous system   . Dyslipidemia   . Chronic renal insufficiency   . Seizures     Partial  . Memory disturbance   . Sprain of neck 06/26/2013    Past Surgical History  Procedure Laterality Date  . Brain meningioma excision Right   . Mastectomies Bilateral   . Abdominal hysterectomy    . Breast reconstructive    . Breast reconstruction Bilateral   . Lumbosacral    . Lumbar spine surgery N/A   . Tubal ligation N/A   . Gallbladder resection    . Cholecystectomy    . Basal cell carcinoma excision    . Basal cell carcinoma excision    . Nose surgery      Basal cell carcinoma resection from the nose  . Dilution    . Dilation and curettage of uterus    . Ablation procedure      Cardiac, for Afib    There were no vitals filed for this visit.  Visit Diagnosis:  Activity intolerance  Bilateral low back pain without sciatica  Weakness generalized  Neck pain       Subjective Assessment - 01/26/15 1404    Symptoms Neck is still painful.     Currently in Pain? Yes   Pain Score 4    Pain Location Back   Pain Orientation Right;Left   Pain Descriptors / Indicators Aching   Pain Type Chronic pain   Pain Radiating Towards Rt hip   Pain Onset More than a month ago   Pain Frequency Constant   Aggravating Factors  standing, bending, walking, sleeping in bed   Pain Relieving Factors rest, heat   Effect of Pain on Daily Activities limited standing and tolerance for daily activity   Pain Score 7   Pain Type Chronic pain   Pain Location Neck   Pain Orientation Right;Left   Pain Descriptors / Indicators Aching   Pain Frequency Constant                       [facility] Adult PT Treatment/Exercise - 01/26/15 0001    Neck Exercises: Seated   Cervical Rotation Both;10 reps   Shoulder Shrugs 10 reps   Shoulder Rolls 10 reps   Lumbar Exercises: Aerobic   Stationary Bike L2 x 8 min  PT present to discuss goals   Knee/Hip Exercises: Seated   Long Arc Quad Strengthening;Weights;1 set;Right;15 reps  Not able to perform on Lt secondary to pain   Long Arc Quad Weight --  1.5# added   Other Seated Knee Exercises Added 1.5# wts 2x10 bil   Shoulder Exercises: Seated   Elevation AROM;Strengthening;Both;20 reps  forward raise to height of the shoulders   Shoulder Exercises: Pulleys   Flexion 3 minutes   ABduction 3 minutes;2 minutes  with HMP on Neck during activity   Modalities   Modalities Moist Heat   Moist Heat Therapy   Number Minutes Moist Heat 15 Minutes   Moist Heat Location Other (comment)  neck and lumbar   Electrical Stimulation   Electrical Stimulation Location Neck   Electrical Stimulation Action IFC   Electrical Stimulation Parameters 15 minutes   Electrical Stimulation Goals Pain                  PT Short Term Goals - 01/26/15 1410    PT SHORT TERM GOAL #1   Title be independent in initial HEP   Status Achieved            PT Long Term Goals - 01/26/15 1410    PT LONG TERM GOAL #1   Title be independent in an advanced HEP   Status On-going  independent and compliant with current HEP   PT LONG TERM GOAL #3   Title report a 40% reduction in neck and LBP with ADLs and self-care   Time 8   Period Weeks   Status On-going  35-40% improvement in LBP, 60% with neck   PT LONG TERM GOAL #4   Title stand for 20 minutes for ADLs and housework without need to rest   Time 8   Period Weeks   Status Achieved  up to 30 minutes               Plan - 01/26/15 1412    Clinical Impression Statement Pt with 25-60% overall improvement in neck and low back pain.  Pt able to ascend and descend 4 steps with increased ease and turn neck with less pain.  See goals for assessment.    Pt will benefit from skilled therapeutic intervention in order to improve on the following deficits Difficulty walking;Improper body mechanics;Decreased endurance;Decreased activity tolerance;Pain;Decreased strength;Decreased mobility   Rehab Potential Good   PT Frequency 2x / week   PT Duration 8 weeks   PT Treatment/Interventions ADLs/Self Care Home Management;Moist Heat;Therapeutic activities;Patient/family education;Therapeutic exercise;Neuromuscular re-education;Cryotherapy;Electrical Stimulation   PT Next Visit Plan continue to improve enduance, strength, functional activities    Consulted and Agree with Plan of Care Patient        Problem List Patient Active Problem List   Diagnosis Date Noted  . Sprain of neck 06/26/2013  . Obstructive sleep apnea (adult) (pediatric) 02/04/2013  . Atrial fibrillation 02/04/2013  . Other general symptoms 02/04/2013  . Other vitamin B12 deficiency anemia 02/04/2013  . Memory loss 02/04/2013  . Lumbago 02/04/2013    Catheline Hixon, PT 01/26/2015, 2:44 PM  [facility] [address] [place], [place], [address] Phone:  [phone]   Fax:  [phone]

## 2015-01-28 ENCOUNTER — Ambulatory Visit: Payer: Medicare Other

## 2015-01-28 DIAGNOSIS — R6889 Other general symptoms and signs: Secondary | ICD-10-CM | POA: Diagnosis not present

## 2015-01-28 DIAGNOSIS — M542 Cervicalgia: Secondary | ICD-10-CM | POA: Diagnosis not present

## 2015-01-28 DIAGNOSIS — M545 Low back pain, unspecified: Secondary | ICD-10-CM

## 2015-01-28 DIAGNOSIS — M797 Fibromyalgia: Secondary | ICD-10-CM | POA: Diagnosis not present

## 2015-01-28 DIAGNOSIS — I4891 Unspecified atrial fibrillation: Secondary | ICD-10-CM | POA: Diagnosis not present

## 2015-01-28 DIAGNOSIS — R531 Weakness: Secondary | ICD-10-CM | POA: Diagnosis not present

## 2015-01-28 NOTE — Therapy (Signed)
[facility] [address] [place], [place], [address] Phone: [phone]   Fax:  [phone]  Physical Therapy Treatment  Patient Details  Name: Debbie [name] MRN: [id number] Date of Birth: 02/05/1946 Referring Provider:  Kathrynn Ducking, MD  Encounter Date: 01/28/2015      PT End of Session - 01/28/15 1416    Visit Number 8   Number of Visits 10  Medicare   Date for PT Re-Evaluation 02/18/15   PT Start Time 1400   PT Stop Time 1429   PT Time Calculation (min) 29 min   Activity Tolerance Patient tolerated treatment well   Behavior During Therapy [facility] for tasks assessed/performed      Past Medical History  Diagnosis Date  . Hypertension   . Atrial fibrillation   . Depression with anxiety   . Fibromyalgia   . Breast cancer   . Muscle tension headache   . History of partial seizures   . Benign essential tremor   . Obesity   . Drowsiness     Excessive daytime  . Obstructive sleep apnea   . Vitamin B12 deficiency   . Tremors of nervous system   . Dyslipidemia   . Chronic renal insufficiency   . Seizures     Partial  . Memory disturbance   . Sprain of neck 06/26/2013    Past Surgical History  Procedure Laterality Date  . Brain meningioma excision Right   . Mastectomies Bilateral   . Abdominal hysterectomy    . Breast reconstructive    . Breast reconstruction Bilateral   . Lumbosacral    . Lumbar spine surgery N/A   . Tubal ligation N/A   . Gallbladder resection    . Cholecystectomy    . Basal cell carcinoma excision    . Basal cell carcinoma excision    . Nose surgery      Basal cell carcinoma resection from the nose  . Dilution    . Dilation and curettage of uterus    . Ablation procedure      Cardiac, for Afib    There were no vitals filed for this visit.  Visit Diagnosis:  Bilateral low back pain without sciatica  Neck pain      Subjective Assessment - 01/28/15 1403     Symptoms Pt reports that she is having a really bad day.  Pt reports that she wasn't going to come but didn't want to cancel.  Really bad night and had to lift neck to turn over in bed.     Currently in Pain? Yes   Pain Score 6    Pain Location Back  neck pain: 9/10   Pain Orientation Right;Left;Lower                       [facility] Adult PT Treatment/Exercise - 01/28/15 0001    Modalities   Modalities Moist Heat;Electrical Stimulation   Moist Heat Therapy   Number Minutes Moist Heat 15 Minutes   Moist Heat Location Other (comment)  neck and low back   Electrical Stimulation   Electrical Stimulation Location neck   Electrical Stimulation Action IFC   Electrical Stimulation Parameters 15 min   Electrical Stimulation Goals Pain                  PT Short Term Goals - 01/26/15 1410    PT SHORT TERM GOAL #1   Title be independent in initial HEP  Status Achieved           PT Long Term Goals - 01/26/15 1410    PT LONG TERM GOAL #1   Title be independent in an advanced HEP   Status On-going  independent and compliant with current HEP   PT LONG TERM GOAL #3   Title report a 40% reduction in neck and LBP with ADLs and self-care   Time 8   Period Weeks   Status On-going  35-40% improvement in LBP, 60% with neck   PT LONG TERM GOAL #4   Title stand for 20 minutes for ADLs and housework without need to rest   Time 8   Period Weeks   Status Achieved  up to 30 minutes               Plan - 01/28/15 1418    Clinical Impression Statement Modalties only today.  Pt wit 8-9/10 pain today when she entered the clinic.  Not able to perform exercises.     Pt will benefit from skilled therapeutic intervention in order to improve on the following deficits Difficulty walking;Improper body mechanics;Decreased endurance;Decreased activity tolerance;Pain;Decreased strength;Decreased mobility   Rehab Potential Good   PT Frequency 2x / week   PT Duration 8 weeks    PT Treatment/Interventions ADLs/Self Care Home Management;Moist Heat;Therapeutic activities;Patient/family education;Therapeutic exercise;Neuromuscular re-education;Cryotherapy;Electrical Stimulation   PT Next Visit Plan Resume activity as pt is able.  Modalities as needed.     Consulted and Agree with Plan of Care Patient        Problem List Patient Active Problem List   Diagnosis Date Noted  . Sprain of neck 06/26/2013  . Obstructive sleep apnea (adult) (pediatric) 02/04/2013  . Atrial fibrillation 02/04/2013  . Other general symptoms 02/04/2013  . Other vitamin B12 deficiency anemia 02/04/2013  . Memory loss 02/04/2013  . Lumbago 02/04/2013    TAKACS,KELLY, PT 01/28/2015, 2:21 PM  [facility] [address] [place], [place], [address] Phone: [phone]   Fax:  [phone]

## 2015-02-02 ENCOUNTER — Ambulatory Visit: Payer: Medicare Other | Admitting: Physical Therapy

## 2015-02-02 ENCOUNTER — Encounter: Payer: Self-pay | Admitting: Physical Therapy

## 2015-02-02 DIAGNOSIS — M542 Cervicalgia: Secondary | ICD-10-CM

## 2015-02-02 DIAGNOSIS — I4891 Unspecified atrial fibrillation: Secondary | ICD-10-CM | POA: Diagnosis not present

## 2015-02-02 DIAGNOSIS — R6889 Other general symptoms and signs: Secondary | ICD-10-CM | POA: Diagnosis not present

## 2015-02-02 DIAGNOSIS — R531 Weakness: Secondary | ICD-10-CM | POA: Diagnosis not present

## 2015-02-02 DIAGNOSIS — M545 Low back pain, unspecified: Secondary | ICD-10-CM

## 2015-02-02 DIAGNOSIS — M797 Fibromyalgia: Secondary | ICD-10-CM | POA: Diagnosis not present

## 2015-02-02 NOTE — Therapy (Signed)
[facility] [address] [place], [place], [address] Phone: [phone]   Fax:  [phone]  Physical Therapy Treatment  Patient Details  Name: Debbie [name] MRN: [id number] Date of Birth: 25-Nov-1945 Referring Provider:  Elba Barman, MD  Encounter Date: 02/02/2015      PT End of Session - 02/02/15 1724    Visit Number 9   Number of Visits 10   Date for PT Re-Evaluation 02/18/15   PT Start Time 8937   PT Stop Time 1445   PT Time Calculation (min) 41 min   Activity Tolerance Patient tolerated treatment well   Behavior During Therapy [facility] for tasks assessed/performed      Past Medical History  Diagnosis Date  . Hypertension   . Atrial fibrillation   . Depression with anxiety   . Fibromyalgia   . Breast cancer   . Muscle tension headache   . History of partial seizures   . Benign essential tremor   . Obesity   . Drowsiness     Excessive daytime  . Obstructive sleep apnea   . Vitamin B12 deficiency   . Tremors of nervous system   . Dyslipidemia   . Chronic renal insufficiency   . Seizures     Partial  . Memory disturbance   . Sprain of neck 06/26/2013    Past Surgical History  Procedure Laterality Date  . Brain meningioma excision Right   . Mastectomies Bilateral   . Abdominal hysterectomy    . Breast reconstructive    . Breast reconstruction Bilateral   . Lumbosacral    . Lumbar spine surgery N/A   . Tubal ligation N/A   . Gallbladder resection    . Cholecystectomy    . Basal cell carcinoma excision    . Basal cell carcinoma excision    . Nose surgery      Basal cell carcinoma resection from the nose  . Dilution    . Dilation and curettage of uterus    . Ablation procedure      Cardiac, for Afib    There were no vitals filed for this visit.  Visit Diagnosis:  Bilateral low back pain without sciatica  Neck pain  Activity intolerance  Weakness generalized       Subjective Assessment - 02/02/15 1409    Symptoms Today I have a better day then last week, only my left knee hurts.    Pertinent History 2 lumbar surgeries.     Limitations Sitting;Standing;Walking   How long can you sit comfortably? 1.5 hours    How long can you stand comfortably? 61mn   How long can you walk comfortably? using walker for long distance, or cane for steps    Diagnostic tests April 2015- MRI per pt report and pt reports degeneration   Currently in Pain? Yes   Pain Score 6   all over the body rated as a 2/10 today, knee is 6/10   Pain Location Knee   Pain Orientation Left   Pain Descriptors / Indicators Aching   Pain Type Chronic pain   Pain Onset More than a month ago   Pain Frequency Constant   Aggravating Factors  stanidng, bending, walking, sleeping on left side   Pain Relieving Factors rest, heat   Effect of Pain on Daily Activities limited standing tolerance and decreased activity tolerance   Multiple Pain Sites No  [facility] Adult PT Treatment/Exercise - 02/02/15 0001    Neck Exercises: Seated   Cervical Rotation Both;5 reps  with 20 sec hold   Shoulder Shrugs 10 reps   Shoulder Rolls 10 reps   Lumbar Exercises: Aerobic   Stationary Bike L1 x 3  needed to discontinue due to left knee pain   Shoulder Exercises: Seated   External Rotation AROM;20 reps  with red t-banc   Other Seated Exercises 3 way raises with 1 #   Shoulder Exercises: Standing   Other Standing Exercises cone stacking  1 min to 2nd shelf   Other Standing Exercises cone stacking with 1# x 1 min   Shoulder Exercises: Pulleys   Flexion 3 minutes   ABduction 2 minutes  each side for 2  min                  PT Short Term Goals - 01/26/15 1410    PT SHORT TERM GOAL #1   Title be independent in initial HEP   Status Achieved           PT Long Term Goals - 02/02/15 1436    PT LONG TERM GOAL #1   Title be independent in an advanced HEP    Time 8   Period Weeks   Status On-going   PT LONG TERM GOAL #2   Title reduce FOTO to < or = to 56% limitation   Time 8   Period Weeks   Status On-going   PT LONG TERM GOAL #3   Title report a 40% reduction in neck and LBP with ADLs and self-care   Baseline pt reports a 80% improvement today, but inconsistent   Time 8   Period Weeks   Status Partially Met   PT LONG TERM GOAL #4   Title stand for 20 minutes for ADLs and housework without need to rest   Time 8   Period Weeks   Status Achieved               Plan - 02/02/15 1725    Clinical Impression Statement Pt had a great day, wishes to skip modalities at end of session due to feeling great except left knee.    Pt will benefit from skilled therapeutic intervention in order to improve on the following deficits Difficulty walking;Improper body mechanics;Decreased endurance;Decreased activity tolerance;Pain;Decreased strength;Decreased mobility   Rehab Potential Good   PT Frequency 2x / week   PT Duration 8 weeks   PT Treatment/Interventions ADLs/Self Care Home Management;Moist Heat;Therapeutic activities;Patient/family education;Therapeutic exercise;Neuromuscular re-education;Cryotherapy;Electrical Stimulation   PT Next Visit Plan Resume activity as pt is able.  Modalities as needed.     PT Home Exercise Plan continue to review HEP   Recommended Other Services none   Consulted and Agree with Plan of Care Patient        Problem List Patient Active Problem List   Diagnosis Date Noted  . Sprain of neck 06/26/2013  . Obstructive sleep apnea (adult) (pediatric) 02/04/2013  . Atrial fibrillation 02/04/2013  . Other general symptoms 02/04/2013  . Other vitamin B12 deficiency anemia 02/04/2013  . Memory loss 02/04/2013  . Lumbago 02/04/2013    NAUMANN-HOUEGNIFIO,Ardena Gangl PTA 02/02/2015, 5:28 PM  [facility] [address], [place], [place], [address] Phone:  [phone]   Fax:  [phone]

## 2015-02-04 ENCOUNTER — Ambulatory Visit: Payer: Medicare Other

## 2015-02-04 DIAGNOSIS — M545 Low back pain, unspecified: Secondary | ICD-10-CM

## 2015-02-04 DIAGNOSIS — M797 Fibromyalgia: Secondary | ICD-10-CM | POA: Diagnosis not present

## 2015-02-04 DIAGNOSIS — R6889 Other general symptoms and signs: Secondary | ICD-10-CM

## 2015-02-04 DIAGNOSIS — I4891 Unspecified atrial fibrillation: Secondary | ICD-10-CM | POA: Diagnosis not present

## 2015-02-04 DIAGNOSIS — M542 Cervicalgia: Secondary | ICD-10-CM

## 2015-02-04 DIAGNOSIS — R531 Weakness: Secondary | ICD-10-CM

## 2015-02-04 NOTE — Therapy (Signed)
[facility] [address], [place], [place], [address] Phone: [phone]   Fax:  [phone]  Physical Therapy Treatment  Patient Details  Name: Debbie [name] MRN: [id number] Date of Birth: 07/10/1946 Referring Provider:  Kathrynn Ducking, MD  Encounter Date: 02/04/2015      PT End of Session - 02/04/15 1433    Visit Number 10   Number of Visits 20  Medicare   Date for PT Re-Evaluation 02/18/15   PT Start Time 4825   PT Stop Time 0037   PT Time Calculation (min) 46 min   Activity Tolerance Patient tolerated treatment well   Behavior During Therapy [facility] for tasks assessed/performed      Past Medical History  Diagnosis Date  . Hypertension   . Atrial fibrillation   . Depression with anxiety   . Fibromyalgia   . Breast cancer   . Muscle tension headache   . History of partial seizures   . Benign essential tremor   . Obesity   . Drowsiness     Excessive daytime  . Obstructive sleep apnea   . Vitamin B12 deficiency   . Tremors of nervous system   . Dyslipidemia   . Chronic renal insufficiency   . Seizures     Partial  . Memory disturbance   . Sprain of neck 06/26/2013    Past Surgical History  Procedure Laterality Date  . Brain meningioma excision Right   . Mastectomies Bilateral   . Abdominal hysterectomy    . Breast reconstructive    . Breast reconstruction Bilateral   . Lumbosacral    . Lumbar spine surgery N/A   . Tubal ligation N/A   . Gallbladder resection    . Cholecystectomy    . Basal cell carcinoma excision    . Basal cell carcinoma excision    . Nose surgery      Basal cell carcinoma resection from the nose  . Dilution    . Dilation and curettage of uterus    . Ablation procedure      Cardiac, for Afib    There were no vitals filed for this visit.  Visit Diagnosis:  Bilateral low back pain without sciatica  Activity intolerance  Weakness generalized  Neck pain       Subjective Assessment - 02/04/15 1410    Symptoms Pt with continued Lt knee pain today.  Neck and low back are feeling better.   Currently in Pain? Yes   Pain Score 5    Pain Location Back   Pain Orientation Right;Left   Pain Descriptors / Indicators Aching   Pain Type Chronic pain            [facility] PT Assessment - 02/04/15 0001    Assessment   Medical Diagnosis LBP with sciatica (M54.5), sprain of neck (C48.8QBV)   Next MD Visit 05/13/2015   Observation/Other Assessments   Focus on Therapeutic Outcomes (FOTO)  57% limitation                   [facility] Adult PT Treatment/Exercise - 02/04/15 0001    Neck Exercises: Seated   Cervical Rotation Both;5 reps  with 20 sec hold   Cervical Rotation Limitations cervical sidebending 3x 20 seconds   Lumbar Exercises: Aerobic   UBE (Upper Arm Bike) Level 0 x 6 min (3/3)   Knee/Hip Exercises: Standing   Other Standing Knee Exercises standing hip abduction with abdominal bracing 2x10 each  Shoulder Exercises: Seated   Other Seated Exercises 3 way raises with 1 #   Shoulder Exercises: Pulleys   Flexion 3 minutes   ABduction 2 minutes   Modalities   Modalities Electrical Stimulation;Moist Heat   Moist Heat Therapy   Number Minutes Moist Heat 15 Minutes   Moist Heat Location Other (comment)  lumbar   Electrical Stimulation   Electrical Stimulation Location low back   Electrical Stimulation Action IFC   Electrical Stimulation Parameters 15 min   Electrical Stimulation Goals Pain                  PT Short Term Goals - 01/26/15 1410    PT SHORT TERM GOAL #1   Title be independent in initial HEP   Status Achieved           PT Long Term Goals - 02/10/2015 1440    PT LONG TERM GOAL #2   Title reduce FOTO to < or = to 56% limitation   Status On-going  57% limitation               Plan - Feb 10, 2015 1413    Clinical Impression Statement FOTO improved to 57% limitation.  Pt is more mobile and improved  ease of movement.     Pt will benefit from skilled therapeutic intervention in order to improve on the following deficits Difficulty walking;Improper body mechanics;Decreased endurance;Decreased activity tolerance;Pain;Decreased strength;Decreased mobility   Rehab Potential Good   PT Frequency 2x / week   PT Duration 8 weeks   PT Treatment/Interventions ADLs/Self Care Home Management;Moist Heat;Therapeutic activities;Patient/family education;Therapeutic exercise;Neuromuscular re-education;Cryotherapy;Electrical Stimulation   PT Next Visit Plan Activity progress.  Modalities as needed.     Consulted and Agree with Plan of Care Patient          G-Codes - 02-10-15 1405    Functional Assessment Tool Used 57% limitation: FOTO   Functional Limitation Other PT primary   Other PT Primary Current Status (M7672) At least 40 percent but less than 60 percent impaired, limited or restricted   Other PT Primary Goal Status (C9470) At least 40 percent but less than 60 percent impaired, limited or restricted      Problem List Patient Active Problem List   Diagnosis Date Noted  . Sprain of neck 06/26/2013  . Obstructive sleep apnea (adult) (pediatric) 02/04/2013  . Atrial fibrillation 02/04/2013  . Other general symptoms 02/04/2013  . Other vitamin B12 deficiency anemia 02/04/2013  . Memory loss 02/04/2013  . Lumbago 02/04/2013    TAKACS,KELLY, PT 2015/02/10, 2:41 PM  [facility] [address] [place], [place], [address] Phone: [phone]   Fax:  [phone]

## 2015-02-06 DIAGNOSIS — N183 Chronic kidney disease, stage 3 (moderate): Secondary | ICD-10-CM | POA: Diagnosis not present

## 2015-02-11 ENCOUNTER — Encounter: Payer: Self-pay | Admitting: Physical Therapy

## 2015-02-11 ENCOUNTER — Ambulatory Visit: Payer: Medicare Other | Attending: Neurology | Admitting: Physical Therapy

## 2015-02-11 DIAGNOSIS — E785 Hyperlipidemia, unspecified: Secondary | ICD-10-CM | POA: Diagnosis not present

## 2015-02-11 DIAGNOSIS — I4891 Unspecified atrial fibrillation: Secondary | ICD-10-CM | POA: Diagnosis not present

## 2015-02-11 DIAGNOSIS — R531 Weakness: Secondary | ICD-10-CM

## 2015-02-11 DIAGNOSIS — G250 Essential tremor: Secondary | ICD-10-CM | POA: Insufficient documentation

## 2015-02-11 DIAGNOSIS — M545 Low back pain, unspecified: Secondary | ICD-10-CM

## 2015-02-11 DIAGNOSIS — R413 Other amnesia: Secondary | ICD-10-CM | POA: Insufficient documentation

## 2015-02-11 DIAGNOSIS — M542 Cervicalgia: Secondary | ICD-10-CM

## 2015-02-11 DIAGNOSIS — M797 Fibromyalgia: Secondary | ICD-10-CM | POA: Insufficient documentation

## 2015-02-11 DIAGNOSIS — G4733 Obstructive sleep apnea (adult) (pediatric): Secondary | ICD-10-CM | POA: Diagnosis not present

## 2015-02-11 DIAGNOSIS — I129 Hypertensive chronic kidney disease with stage 1 through stage 4 chronic kidney disease, or unspecified chronic kidney disease: Secondary | ICD-10-CM | POA: Insufficient documentation

## 2015-02-11 DIAGNOSIS — R6889 Other general symptoms and signs: Secondary | ICD-10-CM

## 2015-02-11 DIAGNOSIS — N189 Chronic kidney disease, unspecified: Secondary | ICD-10-CM | POA: Diagnosis not present

## 2015-02-11 DIAGNOSIS — E669 Obesity, unspecified: Secondary | ICD-10-CM | POA: Diagnosis not present

## 2015-02-11 NOTE — Therapy (Signed)
[facility] [address] [place], [place], [address] Phone: [phone]   Fax:  [phone]  Physical Therapy Treatment  Patient Details  Name: Debbie [name] MRN: [id number] Date of Birth: Feb 13, 1946 Referring Provider:  Elba Barman, MD  Encounter Date: 02/11/2015      PT End of Session - 02/11/15 1318    Visit Number 11   Number of Visits 20   Date for PT Re-Evaluation 02/18/15   PT Start Time 1230   PT Stop Time 1330   PT Time Calculation (min) 60 min   Activity Tolerance Other (comment)  Pt stumbbled in her kitchen yesterday and hurt her left ankle   Behavior During Therapy [facility] for tasks assessed/performed      Past Medical History  Diagnosis Date  . Hypertension   . Atrial fibrillation   . Depression with anxiety   . Fibromyalgia   . Breast cancer   . Muscle tension headache   . History of partial seizures   . Benign essential tremor   . Obesity   . Drowsiness     Excessive daytime  . Obstructive sleep apnea   . Vitamin B12 deficiency   . Tremors of nervous system   . Dyslipidemia   . Chronic renal insufficiency   . Seizures     Partial  . Memory disturbance   . Sprain of neck 06/26/2013    Past Surgical History  Procedure Laterality Date  . Brain meningioma excision Right   . Mastectomies Bilateral   . Abdominal hysterectomy    . Breast reconstructive    . Breast reconstruction Bilateral   . Lumbosacral    . Lumbar spine surgery N/A   . Tubal ligation N/A   . Gallbladder resection    . Cholecystectomy    . Basal cell carcinoma excision    . Basal cell carcinoma excision    . Nose surgery      Basal cell carcinoma resection from the nose  . Dilution    . Dilation and curettage of uterus    . Ablation procedure      Cardiac, for Afib    There were no vitals filed for this visit.  Visit Diagnosis:  Bilateral low back pain without sciatica  Activity  intolerance  Weakness generalized  Neck pain      Subjective Assessment - 02/11/15 1234    Subjective Pt reports she was stumbeling it her kitchen and was able to brace her on the cabinett, reports discomfort on dorsam of left foot, able to walk without a limb, advised to see MD if not improving   Pertinent History 2 lumbar surgeries.     Currently in Pain? Yes   Pain Score 4    Pain Location Back   Pain Orientation Right;Left   Pain Descriptors / Indicators Aching   Pain Type Chronic pain   Pain Onset More than a month ago   Aggravating Factors  standing, bending, walking, sleeping on left side   Pain Relieving Factors rest, heat   Effect of Pain on Daily Activities limited standing tolerance and decreased activity tolerance   Multiple Pain Sites Yes   Pain Score 2   Pain Location Neck   Pain Orientation Right;Left   Pain Descriptors / Indicators Aching   Pain Type Chronic pain   Pain Frequency Constant  [facility] Adult PT Treatment/Exercise - 02/11/15 0001    Elbow Exercises   Elbow Flexion Seated;Strengthening  biceps curls 2# 2x10   Knee/Hip Exercises: Seated   Long Arc Quad Strengthening;20 reps   Long Arc Quad Weight 3 lbs.   Long [employer] Limitations --  pt tolerated well, but more challenging on left LE   Shoulder Exercises: Seated   Other Seated Exercises 3 way raises with 1 #   Other Seated Exercises shoulder rolls 3x10   Shoulder Exercises: Pulleys   Flexion 3 minutes   ABduction 3 minutes  x1 each side                 PT Education - 02/11/15 1318    Education provided Yes   Education Details Information given where to buy pulleys for home use   Person(s) Educated Patient   Methods Explanation   Comprehension Verbalized understanding          PT Short Term Goals - 02/11/15 1328    PT SHORT TERM GOAL #1   Title be independent in initial HEP   Time 4   Period Weeks   Status Achieved   PT SHORT TERM GOAL  #2   Title report a 25% reduction in neck and LBP with ADLs and self-care   Time 4   Period Weeks   Status Achieved           PT Long Term Goals - 02/11/15 1329    PT LONG TERM GOAL #1   Title be independent in an advanced HEP   Time 8   Period Weeks   Status On-going   PT LONG TERM GOAL #2   Title reduce FOTO to < or = to 56% limitation   Time 8   Period Weeks   Status On-going   PT LONG TERM GOAL #3   Title report a 40% reduction in neck and LBP with ADLs and self-care   Time 8   Period Weeks   Status Partially Met   PT LONG TERM GOAL #4   Title stand for 20 minutes for ADLs and housework without need to rest   Time 8   Period Weeks   Status Achieved               Plan - 02/11/15 1320    Clinical Impression Statement Patient with a set back due to stumbleling in the kitchen yesterday and hurting her left ankle   Pt will benefit from skilled therapeutic intervention in order to improve on the following deficits Difficulty walking;Improper body mechanics;Decreased endurance;Decreased activity tolerance;Pain;Decreased strength;Decreased mobility   Rehab Potential Good   PT Frequency 2x / week   PT Duration 8 weeks   PT Treatment/Interventions ADLs/Self Care Home Management;Moist Heat;Therapeutic activities;Patient/family education;Therapeutic exercise;Neuromuscular re-education;Cryotherapy;Electrical Stimulation   PT Next Visit Plan progress activity to patients tolerance,  Modalities as needed.     Consulted and Agree with Plan of Care Patient        Problem List Patient Active Problem List   Diagnosis Date Noted  . Sprain of neck 06/26/2013  . Obstructive sleep apnea (adult) (pediatric) 02/04/2013  . Atrial fibrillation 02/04/2013  . Other general symptoms 02/04/2013  . Other vitamin B12 deficiency anemia 02/04/2013  . Memory loss 02/04/2013  . Lumbago 02/04/2013    NAUMANN-HOUEGNIFIO,Tauni Sanks PTA 02/11/2015, 1:36 PM  [facility] [address] [place], [place], [address] Phone: [phone]   Fax:  [phone]

## 2015-02-12 DIAGNOSIS — M25572 Pain in left ankle and joints of left foot: Secondary | ICD-10-CM | POA: Diagnosis not present

## 2015-02-12 DIAGNOSIS — Y939 Activity, unspecified: Secondary | ICD-10-CM | POA: Diagnosis not present

## 2015-02-12 DIAGNOSIS — I480 Paroxysmal atrial fibrillation: Secondary | ICD-10-CM | POA: Diagnosis not present

## 2015-02-12 DIAGNOSIS — T733XXA Exhaustion due to excessive exertion, initial encounter: Secondary | ICD-10-CM | POA: Diagnosis not present

## 2015-02-12 DIAGNOSIS — N183 Chronic kidney disease, stage 3 (moderate): Secondary | ICD-10-CM | POA: Diagnosis not present

## 2015-02-12 DIAGNOSIS — I959 Hypotension, unspecified: Secondary | ICD-10-CM | POA: Diagnosis not present

## 2015-02-12 DIAGNOSIS — I129 Hypertensive chronic kidney disease with stage 1 through stage 4 chronic kidney disease, or unspecified chronic kidney disease: Secondary | ICD-10-CM | POA: Diagnosis not present

## 2015-02-16 ENCOUNTER — Ambulatory Visit: Payer: Medicare Other | Admitting: Physical Therapy

## 2015-02-16 ENCOUNTER — Encounter: Payer: Self-pay | Admitting: Physical Therapy

## 2015-02-16 DIAGNOSIS — M545 Low back pain, unspecified: Secondary | ICD-10-CM

## 2015-02-16 DIAGNOSIS — R531 Weakness: Secondary | ICD-10-CM | POA: Diagnosis not present

## 2015-02-16 DIAGNOSIS — R6889 Other general symptoms and signs: Secondary | ICD-10-CM

## 2015-02-16 DIAGNOSIS — M797 Fibromyalgia: Secondary | ICD-10-CM | POA: Diagnosis not present

## 2015-02-16 DIAGNOSIS — I4891 Unspecified atrial fibrillation: Secondary | ICD-10-CM | POA: Diagnosis not present

## 2015-02-16 DIAGNOSIS — M542 Cervicalgia: Secondary | ICD-10-CM | POA: Diagnosis not present

## 2015-02-16 NOTE — Patient Instructions (Addendum)
Lower abdominal/core stability exercises  1. Practice your breathing technique: Inhale through your nose expanding your belly and rib cage. Try not to breathe into your chest. Exhale slowly and gradually out your mouth feeling a sense of softness to your body. Practice multiple times. This can be performed unlimited.  2. Finding the lower abdominals. Laying on your back with the knees bent, place your fingers just below your belly button. Using your breathing technique from above, on your exhale gently pull the belly button away from your fingertips without tensing any other muscles. Practice this 5x. Next, as you exhale, draw belly button inwards and hold onto it...then feel as if you are pulling that muscle across your pelvis like you are tightening a belt. This can be hard to do at first so be patient and practice. Do 5-10 reps 1-3 x day. Always recognize quality over quantity; if your abdominal muscles become tired you will notice you may tighten/contract other muscles. This is the time to take a break.   Practice this first laying on your back, then in sitting, progressing to standing and finally adding it to all your daily movements.   3. Finding your pelvic floor. Using the breathing technique above, when your exhale, this time draw your pelvic floor muscles up as if you were attempting to stop the flow of urination. Be careful NOT to tense any other muscles. This can be hard, BE PATIENT. Try to hold up to 5 seconds increase to  10 seconds repeating 10x. Try 2x a day. Once you feel you are doing this well, add this contraction to exercise #2. First contracting your pelvic floor followed by lower abdominals.  4. Adding leg movements. Add the following leg movements to challenge your ability to keep your core stable:   1. Single leg drop outs: Laying on your back with knees bent feet flat. Inhale,  dropping one knee outward KEEPING YOUR PELVIS STILL.  Exhale as you bring the leg back,  simultaneously performing your lower abdominal contraction.  Do 5-10 on each leg.   Do not perform exercises below until advised by PT 2. Marching: While keeping your pelvis still, lift the right foot a few inches, put it down then lift left foot.  This will mimic a march. Start slow to establish control. Once you have control you may speed it up. Do 10-20x.  You MUST keep your lower abdominlas contracted while you march.  Breathe naturally  3. Single leg slides: Inhale while you slowly slide one leg out keeping your pelvis still.  Only slide your leg as far as you can keep your pelvis still.  Exhale as you bring the leg back to the start, contracting the lower abdominals as you do that. Keep your upper body relaxed.  Do 5-10 on each side.

## 2015-02-16 NOTE — Therapy (Signed)
[facility] [address], [place], [place], [address] Phone: [phone]   Fax:  [phone]  Physical Therapy Treatment  Patient Details  Name: Debbie [name] MRN: [id number] Date of Birth: 12-12-1945 Referring Provider:  Bernita Buffy, MD  Encounter Date: 02/16/2015      PT End of Session - 02/16/15 1537    Visit Number 12   Number of Visits 20   Date for PT Re-Evaluation 02/18/15   PT Start Time 3953   PT Stop Time 1546   PT Time Calculation (min) 59 min   Activity Tolerance Other (comment)  all over low stamina, endurance, supine position difficult due to pain   Behavior During Therapy [facility] for tasks assessed/performed      Past Medical History  Diagnosis Date  . Hypertension   . Atrial fibrillation   . Depression with anxiety   . Fibromyalgia   . Breast cancer   . Muscle tension headache   . History of partial seizures   . Benign essential tremor   . Obesity   . Drowsiness     Excessive daytime  . Obstructive sleep apnea   . Vitamin B12 deficiency   . Tremors of nervous system   . Dyslipidemia   . Chronic renal insufficiency   . Seizures     Partial  . Memory disturbance   . Sprain of neck 06/26/2013    Past Surgical History  Procedure Laterality Date  . Brain meningioma excision Right   . Mastectomies Bilateral   . Abdominal hysterectomy    . Breast reconstructive    . Breast reconstruction Bilateral   . Lumbosacral    . Lumbar spine surgery N/A   . Tubal ligation N/A   . Gallbladder resection    . Cholecystectomy    . Basal cell carcinoma excision    . Basal cell carcinoma excision    . Nose surgery      Basal cell carcinoma resection from the nose  . Dilution    . Dilation and curettage of uterus    . Ablation procedure      Cardiac, for Afib    There were no vitals filed for this visit.  Visit Diagnosis:  Bilateral low back pain without sciatica  Activity  intolerance  Weakness generalized      Subjective Assessment - 02/16/15 1502    Subjective Pt saw MD at Urgent Care last Friday, was diagnosed as a pulled tendon in left ankle, no action needed   Limitations Sitting;Standing;Walking   Currently in Pain? Yes   Pain Score 4    Pain Location Back   Pain Orientation Right;Left   Pain Descriptors / Indicators Aching   Pain Type Chronic pain   Pain Onset More than a month ago   Pain Frequency Constant   Aggravating Factors  standing, bending, walking, sleeping on left side    Pain Relieving Factors rest, heat   Effect of Pain on Daily Activities limited standing tolerance and decreased activity tolerance   Multiple Pain Sites Yes   Pain Score 0   Pain Score 3   Pain Location Hip   Pain Orientation Right   Pain Descriptors / Indicators Dull;Aching   Pain Type Chronic pain   Pain Onset More than a month ago   Pain Frequency Constant                       [facility] Adult PT Treatment/Exercise -  02/16/15 0001    Elbow Exercises   Elbow Flexion Seated;Strengthening  biceps curls 2# 3x10   Neck Exercises: Seated   Shoulder Shrugs 10 reps   Shoulder Rolls 20 reps   Lumbar Exercises: Standing   Other Standing Lumbar Exercises Focus on abdominal bracing with [facility] stacking to 2nd shelf x 44mn     Lumbar Exercises: Supine   Other Supine Lumbar Exercises TA activation 2 x 10 with 5 sec hold, ER each leg 2 x 5, attempted marching but unable to perform with Rt leg, attempted leg slides but challenging, all exercises increased time needed  pt is challenged to lay in supine position, but able to tole   Shoulder Exercises: Pulleys   Flexion 3 minutes   ABduction 3 minutes  each side with reminders for technique   Modalities   Modalities Electrical Stimulation   Moist Heat Therapy   Number Minutes Moist Heat 15 Minutes   Moist Heat Location Other (comment)  R lumbar hip area   Electrical Stimulation   Electrical Stimulation  Location low back   Electrical Stimulation Action IFC   Electrical Stimulation Parameters 15   Electrical Stimulation Goals Pain  head elevated on treatment table                PT Education - 02/16/15 1536    Education Details TA activation, including unilateral ER, advised not to perform marching or leg slide at home until PT /PTA assigns    Person(s) Educated Patient   Methods Explanation;Handout   Comprehension Verbalized understanding;Returned demonstration          PT Short Term Goals - 02/16/15 1540    PT SHORT TERM GOAL #1   Title be independent in initial HEP   Time 4   Period Weeks   Status Achieved   PT SHORT TERM GOAL #2   Title report a 25% reduction in neck and LBP with ADLs and self-care   Time 4   Period Weeks   Status Achieved           PT Long Term Goals - 02/16/15 1723    PT LONG TERM GOAL #1   Title be independent in an advanced HEP   Time 8   Period Weeks   Status On-going   PT LONG TERM GOAL #2   Title reduce FOTO to < or = to 56% limitation  scored 57%   Time 8   Period Weeks   Status Partially Met   PT LONG TERM GOAL #3   Title report a 40% reduction in neck and LBP with ADLs and self-care   Baseline pt reports a 80% improvement today, but inconsistent   Time 8   Period Weeks   Status Partially Met   PT LONG TERM GOAL #4   Title stand for 20 minutes for ADLs and housework without need to rest   Time 8   Period Weeks   Status Achieved               Plan - 02/16/15 1716    Clinical Impression Statement Pt feeling better today and reports her improvement as 40%   Pt will benefit from skilled therapeutic intervention in order to improve on the following deficits Difficulty walking;Improper body mechanics;Decreased endurance;Decreased activity tolerance;Pain;Decreased strength;Decreased mobility   Rehab Potential Good   PT Frequency 2x / week   PT Duration 8 weeks   PT Treatment/Interventions ADLs/Self Care Home  Management;Moist Heat;Therapeutic activities;Patient/family education;Therapeutic exercise;Neuromuscular re-education;Cryotherapy;EPrintmaker  PT Next Visit Plan Re-assesment, pt will continue to benefit from skilled PT to continue to improve with abdominal and back strength due to history of two back surgeries   PT Home Exercise Plan revie TA exercises, marching and leg slides only to pt. tolerance   Consulted and Agree with Plan of Care Patient        Problem List Patient Active Problem List   Diagnosis Date Noted  . Sprain of neck 06/26/2013  . Obstructive sleep apnea (adult) (pediatric) 02/04/2013  . Atrial fibrillation 02/04/2013  . Other general symptoms 02/04/2013  . Other vitamin B12 deficiency anemia 02/04/2013  . Memory loss 02/04/2013  . Lumbago 02/04/2013    NAUMANN-HOUEGNIFIO,Cliffard Hair PTA 02/16/2015, 5:27 PM  [facility] [address], [place], [place], [address] Phone: [phone]   Fax:  [phone]

## 2015-02-18 ENCOUNTER — Ambulatory Visit: Payer: Medicare Other

## 2015-02-18 DIAGNOSIS — R531 Weakness: Secondary | ICD-10-CM | POA: Diagnosis not present

## 2015-02-18 DIAGNOSIS — M545 Low back pain, unspecified: Secondary | ICD-10-CM

## 2015-02-18 DIAGNOSIS — R6889 Other general symptoms and signs: Secondary | ICD-10-CM

## 2015-02-18 DIAGNOSIS — I4891 Unspecified atrial fibrillation: Secondary | ICD-10-CM | POA: Diagnosis not present

## 2015-02-18 DIAGNOSIS — M797 Fibromyalgia: Secondary | ICD-10-CM | POA: Diagnosis not present

## 2015-02-18 DIAGNOSIS — M542 Cervicalgia: Secondary | ICD-10-CM | POA: Diagnosis not present

## 2015-02-18 NOTE — Therapy (Signed)
[facility] [address] [place], [place], [address] Phone: [phone]   Fax:  [phone]  Physical Therapy Treatment  Patient Details  Name: Debbie [name] MRN: [id number] Date of Birth: [birth year] Referring Provider:  Kathrynn Ducking, MD  Encounter Date: 02/18/2015      PT End of Session - 02/18/15 1439    Visit Number 13   Number of Visits 20  Medicare   Date for PT Re-Evaluation 04/03/15      Past Medical History  Diagnosis Date  . Hypertension   . Atrial fibrillation   . Depression with anxiety   . Fibromyalgia   . Breast cancer   . Muscle tension headache   . History of partial seizures   . Benign essential tremor   . Obesity   . Drowsiness     Excessive daytime  . Obstructive sleep apnea   . Vitamin B12 deficiency   . Tremors of nervous system   . Dyslipidemia   . Chronic renal insufficiency   . Seizures     Partial  . Memory disturbance   . Sprain of neck 06/26/2013    Past Surgical History  Procedure Laterality Date  . Brain meningioma excision Right   . Mastectomies Bilateral   . Abdominal hysterectomy    . Breast reconstructive    . Breast reconstruction Bilateral   . Lumbosacral    . Lumbar spine surgery N/A   . Tubal ligation N/A   . Gallbladder resection    . Cholecystectomy    . Basal cell carcinoma excision    . Basal cell carcinoma excision    . Nose surgery      Basal cell carcinoma resection from the nose  . Dilution    . Dilation and curettage of uterus    . Ablation procedure      Cardiac, for Afib    There were no vitals filed for this visit.  Visit Diagnosis:  Bilateral low back pain without sciatica - Plan: PT plan of care cert/re-cert  Activity intolerance - Plan: PT plan of care cert/re-cert  Weakness generalized - Plan: PT plan of care cert/re-cert  Neck pain - Plan: PT plan of care cert/re-cert      Subjective Assessment - 02/18/15 1404    Subjective Pt reports 40% improvement since the start of PT.     Currently in Pain? Yes   Pain Score 4    Pain Location Back   Pain Orientation Right;Left   Pain Descriptors / Indicators Aching   Pain Type Chronic pain   Pain Onset More than a month ago   Pain Frequency Constant   Aggravating Factors  standing, bending, walking, sleep on Lt side   Pain Relieving Factors rest, heat   Multiple Pain Sites No            [facility] PT Assessment - 02/18/15 0001    Assessment   Medical Diagnosis LBP with sciatica (M54.5), sprain of neck (Q98.2MEB)   Next MD Visit 05/13/2015   Observation/Other Assessments   Focus on Therapeutic Outcomes (FOTO)  57% limitation                   [facility] Adult PT Treatment/Exercise - 02/18/15 0001    Elbow Exercises   Elbow Flexion Seated;Strengthening  biceps curls 2# 3x10   Lumbar Exercises: Aerobic   Stationary Bike Level 1x 4 minutes   Lumbar Exercises: Standing   Other Standing Lumbar Exercises Focus  on abdominal bracing with [employer] stacking to 2nd shelf 2 x 83min    Pt tolerated 2nd set well today.  LBP as result   Shoulder Exercises: Pulleys   Flexion 3 minutes   ABduction 3 minutes  each side with reminders for technique   Modalities   Modalities Moist Heat   Moist Heat Therapy   Number Minutes Moist Heat 15 Minutes   Moist Heat Location Other (comment)  low back   Electrical Stimulation   Electrical Stimulation Location --   Electrical Stimulation Action --   Electrical Stimulation Parameters --   Printmaker Goals --                  PT Short Term Goals - 02/16/15 1540    PT SHORT TERM GOAL #1   Title be independent in initial HEP   Time 4   Period Weeks   Status Achieved   PT SHORT TERM GOAL #2   Title report a 25% reduction in neck and LBP with ADLs and self-care   Time 4   Period Weeks   Status Achieved           PT Long Term Goals - 02/18/15 1405    PT LONG TERM GOAL #1   Title be  independent in an advanced HEP   Time 6   Period Weeks   Status On-going  independent in current HEP   PT LONG TERM GOAL #2   Title reduce FOTO to < or = to 56% limitation   Time 6   Period Weeks   Status On-going  57% limitation   PT LONG TERM GOAL #3   Title report a 40% reduction in neck and LBP with ADLs and self-care   Status Achieved   PT LONG TERM GOAL #4   Title stand for 20 minutes for ADLs and housework without need to rest   Time 6   Status On-going  15 minutes-inconsistent   PT LONG TERM GOAL #5   Title report 60-70% reduction in neck and lumbar pain with ADLs and self-care   Time 6   Period Weeks   Status New               Plan - 02/18/15 1410    Clinical Impression Statement Pt reports 40% improvement since the start of care.  See goals for status.  New goal set and extend partially met goals.  Pt tolerated bike well today.   Pt will benefit from skilled therapeutic intervention in order to improve on the following deficits Difficulty walking;Improper body mechanics;Decreased endurance;Decreased activity tolerance;Pain;Decreased strength;Decreased mobility   Rehab Potential Good   PT Frequency 2x / week   PT Duration 6 weeks   PT Treatment/Interventions ADLs/Self Care Home Management;Moist Heat;Therapeutic activities;Patient/family education;Therapeutic exercise;Neuromuscular re-education;Cryotherapy;Electrical Stimulation   PT Next Visit Plan UE/LE endurance, flexibility exercises, modalties as needed.  Bike again if well tolerated after treatment.     Consulted and Agree with Plan of Care Patient        Problem List Patient Active Problem List   Diagnosis Date Noted  . Sprain of neck 06/26/2013  . Obstructive sleep apnea (adult) (pediatric) 02/04/2013  . Atrial fibrillation 02/04/2013  . Other general symptoms(780.99) 02/04/2013  . Other vitamin B12 deficiency anemia 02/04/2013  . Memory loss 02/04/2013  . Lumbago 02/04/2013    Ovida Delagarza,  PT 02/18/2015, 2:41 PM  [facility] [address], [place], [place], [address]  Phone: [phone]   Fax:  [phone]

## 2015-02-23 ENCOUNTER — Ambulatory Visit: Payer: Medicare Other | Admitting: Physical Therapy

## 2015-02-23 ENCOUNTER — Encounter: Payer: Self-pay | Admitting: Physical Therapy

## 2015-02-23 DIAGNOSIS — I4891 Unspecified atrial fibrillation: Secondary | ICD-10-CM | POA: Diagnosis not present

## 2015-02-23 DIAGNOSIS — M545 Low back pain, unspecified: Secondary | ICD-10-CM

## 2015-02-23 DIAGNOSIS — R531 Weakness: Secondary | ICD-10-CM

## 2015-02-23 DIAGNOSIS — M797 Fibromyalgia: Secondary | ICD-10-CM | POA: Diagnosis not present

## 2015-02-23 DIAGNOSIS — R6889 Other general symptoms and signs: Secondary | ICD-10-CM

## 2015-02-23 DIAGNOSIS — M542 Cervicalgia: Secondary | ICD-10-CM | POA: Diagnosis not present

## 2015-02-23 NOTE — Therapy (Signed)
[facility] [address] [place], [place], [address] Phone: [phone]   Fax:  [phone]  Physical Therapy Treatment  Patient Details  Name: Debbie [name] MRN: [id number] Date of Birth: 03-07-1946 Referring Provider:  Kathrynn Ducking, MD  Encounter Date: 02/23/2015      PT End of Session - 02/23/15 1500    Visit Number 14   Number of Visits 20   Date for PT Re-Evaluation 04/03/15   PT Start Time 7035   PT Stop Time 1546   PT Time Calculation (min) 60 min   Activity Tolerance Patient tolerated treatment well   Behavior During Therapy [facility] for tasks assessed/performed      Past Medical History  Diagnosis Date  . Hypertension   . Atrial fibrillation   . Depression with anxiety   . Fibromyalgia   . Breast cancer   . Muscle tension headache   . History of partial seizures   . Benign essential tremor   . Obesity   . Drowsiness     Excessive daytime  . Obstructive sleep apnea   . Vitamin B12 deficiency   . Tremors of nervous system   . Dyslipidemia   . Chronic renal insufficiency   . Seizures     Partial  . Memory disturbance   . Sprain of neck 06/26/2013    Past Surgical History  Procedure Laterality Date  . Brain meningioma excision Right   . Mastectomies Bilateral   . Abdominal hysterectomy    . Breast reconstructive    . Breast reconstruction Bilateral   . Lumbosacral    . Lumbar spine surgery N/A   . Tubal ligation N/A   . Gallbladder resection    . Cholecystectomy    . Basal cell carcinoma excision    . Basal cell carcinoma excision    . Nose surgery      Basal cell carcinoma resection from the nose  . Dilution    . Dilation and curettage of uterus    . Ablation procedure      Cardiac, for Afib    There were no vitals filed for this visit.  Visit Diagnosis:  Bilateral low back pain without sciatica  Activity intolerance  Weakness generalized      Subjective Assessment  - 02/23/15 1450    Subjective The back always hurt, but I feel weak   Currently in Pain? Yes   Pain Score 4    Pain Location Back   Pain Orientation Right;Left   Pain Descriptors / Indicators Aching   Pain Type Chronic pain   Pain Radiating Towards no radiating pain today   Pain Onset More than a month ago   Pain Frequency Constant   Pain Score 0   Pain Score 0                         [facility] Adult PT Treatment/Exercise - 02/23/15 0001    Neck Exercises: Seated   Shoulder Shrugs 10 reps   Shoulder Rolls 20 reps   Lumbar Exercises: Aerobic   Stationary Bike Level 1x 6 minutes   Lumbar Exercises: Standing   Other Standing Lumbar Exercises Focus on abdominal bracing with Cone stacking to 2nd shelf  76min 1# added     Lumbar Exercises: Supine   Other Supine Lumbar Exercises TA activation 2 x 10 with 5 sec hold, ER each leg 2 x 53 able to perform marching and leg lengthener  today   Shoulder Exercises: Pulleys   Flexion 3 minutes   ABduction 3 minutes  each side with reminders for technique   Shoulder Exercises: ROM/Strengthening   Other ROM/Strengthening Exercises 3 way raises 1# added   Modalities   Modalities Moist Heat   Moist Heat Therapy   Moist Heat Location Other (comment)   Electrical Stimulation   Electrical Stimulation Location low back   Electrical Stimulation Action IFC   Electrical Stimulation Parameters 15   Electrical Stimulation Goals Pain                  PT Short Term Goals - 02/16/15 1540    PT SHORT TERM GOAL #1   Title be independent in initial HEP   Time 4   Period Weeks   Status Achieved   PT SHORT TERM GOAL #2   Title report a 25% reduction in neck and LBP with ADLs and self-care   Time 4   Period Weeks   Status Achieved           PT Long Term Goals - 02/23/15 1508    PT LONG TERM GOAL #1   Title be independent in an advanced HEP   Time 6   Period Weeks   Status On-going   PT LONG TERM GOAL #2   Title reduce  FOTO to < or = to 56% limitation   Time 6   Period Weeks   Status On-going   PT LONG TERM GOAL #3   Title report a 40% reduction in neck and LBP with ADLs and self-care   Baseline pt reports a 80% improvement today, but inconsistent   Time 8   Period Weeks   Status Achieved   PT LONG TERM GOAL #4   Title stand for 20 minutes for ADLs and housework without need to rest   Time 6   Period Weeks   Status On-going   PT LONG TERM GOAL #5   Title report 60-70% reduction in neck and lumbar pain with ADLs and self-care   Time 6   Period Weeks   Status On-going               Plan - 02/23/15 1503    Pt will benefit from skilled therapeutic intervention in order to improve on the following deficits Difficulty walking;Improper body mechanics;Decreased endurance;Decreased activity tolerance;Pain;Decreased strength;Decreased mobility   Rehab Potential Good   PT Frequency 2x / week   PT Duration 6 weeks   PT Treatment/Interventions ADLs/Self Care Home Management;Moist Heat;Therapeutic activities;Patient/family education;Therapeutic exercise;Neuromuscular re-education;Cryotherapy;Electrical Stimulation   PT Next Visit Plan UE/LE endurance, flexibility exercises, modalties as needed.  Bike again if well tolerated after treatment.     PT Home Exercise Plan reviewTA exercises, marching and leg slides only to pt. tolerance   Consulted and Agree with Plan of Care Patient        Problem List Patient Active Problem List   Diagnosis Date Noted  . Sprain of neck 06/26/2013  . Obstructive sleep apnea (adult) (pediatric) 02/04/2013  . Atrial fibrillation 02/04/2013  . Other general symptoms(780.99) 02/04/2013  . Other vitamin B12 deficiency anemia 02/04/2013  . Memory loss 02/04/2013  . Lumbago 02/04/2013    NAUMANN-HOUEGNIFIO,Aaniyah Strohm PTA 02/23/2015, 3:30 PM  [facility] [address] [place], [place], [address] Phone:  [phone]   Fax:  [phone]

## 2015-02-24 DIAGNOSIS — E785 Hyperlipidemia, unspecified: Secondary | ICD-10-CM | POA: Diagnosis not present

## 2015-02-24 DIAGNOSIS — M797 Fibromyalgia: Secondary | ICD-10-CM | POA: Diagnosis not present

## 2015-02-24 DIAGNOSIS — I10 Essential (primary) hypertension: Secondary | ICD-10-CM | POA: Diagnosis not present

## 2015-02-25 ENCOUNTER — Ambulatory Visit: Payer: Medicare Other

## 2015-02-25 DIAGNOSIS — R6889 Other general symptoms and signs: Secondary | ICD-10-CM

## 2015-02-25 DIAGNOSIS — M545 Low back pain, unspecified: Secondary | ICD-10-CM

## 2015-02-25 DIAGNOSIS — M797 Fibromyalgia: Secondary | ICD-10-CM | POA: Diagnosis not present

## 2015-02-25 DIAGNOSIS — R531 Weakness: Secondary | ICD-10-CM

## 2015-02-25 DIAGNOSIS — M542 Cervicalgia: Secondary | ICD-10-CM

## 2015-02-25 DIAGNOSIS — I4891 Unspecified atrial fibrillation: Secondary | ICD-10-CM | POA: Diagnosis not present

## 2015-02-25 NOTE — Therapy (Signed)
[facility] [address] [place], [place], [address] Phone: [phone]   Fax:  [phone]  Physical Therapy Treatment  Patient Details  Name: Debbie [name] MRN: [id number] Date of Birth: 09-15-1946 Referring Provider:  Kathrynn Ducking, MD  Encounter Date: 02/25/2015      PT End of Session - 02/25/15 1431    Visit Number 15   Number of Visits 20  Medicare   Date for PT Re-Evaluation 04/03/15   PT Start Time 6962   PT Stop Time 1452   PT Time Calculation (min) 47 min   Activity Tolerance Patient tolerated treatment well   Behavior During Therapy [facility] for tasks assessed/performed      Past Medical History  Diagnosis Date  . Hypertension   . Atrial fibrillation   . Depression with anxiety   . Fibromyalgia   . Breast cancer   . Muscle tension headache   . History of partial seizures   . Benign essential tremor   . Obesity   . Drowsiness     Excessive daytime  . Obstructive sleep apnea   . Vitamin B12 deficiency   . Tremors of nervous system   . Dyslipidemia   . Chronic renal insufficiency   . Seizures     Partial  . Memory disturbance   . Sprain of neck 06/26/2013    Past Surgical History  Procedure Laterality Date  . Brain meningioma excision Right   . Mastectomies Bilateral   . Abdominal hysterectomy    . Breast reconstructive    . Breast reconstruction Bilateral   . Lumbosacral    . Lumbar spine surgery N/A   . Tubal ligation N/A   . Gallbladder resection    . Cholecystectomy    . Basal cell carcinoma excision    . Basal cell carcinoma excision    . Nose surgery      Basal cell carcinoma resection from the nose  . Dilution    . Dilation and curettage of uterus    . Ablation procedure      Cardiac, for Afib    There were no vitals filed for this visit.  Visit Diagnosis:  Bilateral low back pain without sciatica  Activity intolerance  Weakness generalized  Neck pain       Subjective Assessment - 02/25/15 1406    Subjective Feeling good.  Last session was a good workout.    Currently in Pain? Yes   Pain Score 2    Pain Orientation Right;Left                         [facility] Adult PT Treatment/Exercise - 02/25/15 0001    Lumbar Exercises: Standing   Other Standing Lumbar Exercises Focus on abdominal bracing with [facility] stacking to 2nd shelf  2x92min 1# added to Rt, 1.5# added to Lt     Shoulder Exercises: Pulleys   Flexion 3 minutes   ABduction 2 minutes  pt with shoulder fatigue with this today   Shoulder Exercises: ROM/Strengthening   Other ROM/Strengthening Exercises 3 way raises 1# added 1x10, no weight 1x10   Modalities   Modalities Moist Heat;Electrical Stimulation   Moist Heat Therapy   Number Minutes Moist Heat 15 Minutes   Moist Heat Location Other (comment)  low back   Electrical Stimulation   Electrical Stimulation Location low back   Electrical Stimulation Action IFC   Electrical Stimulation Parameters 15   Electrical Stimulation  Goals Pain                  PT Short Term Goals - 02/16/15 1540    PT SHORT TERM GOAL #1   Title be independent in initial HEP   Time 4   Period Weeks   Status Achieved   PT SHORT TERM GOAL #2   Title report a 25% reduction in neck and LBP with ADLs and self-care   Time 4   Period Weeks   Status Achieved           PT Long Term Goals - 02/23/15 1508    PT LONG TERM GOAL #1   Title be independent in an advanced HEP   Time 6   Period Weeks   Status On-going   PT LONG TERM GOAL #2   Title reduce FOTO to < or = to 56% limitation   Time 6   Period Weeks   Status On-going   PT LONG TERM GOAL #3   Title report a 40% reduction in neck and LBP with ADLs and self-care   Baseline pt reports a 80% improvement today, but inconsistent   Time 8   Period Weeks   Status Achieved   PT LONG TERM GOAL #4   Title stand for 20 minutes for ADLs and housework without need to rest    Time 6   Period Weeks   Status On-going   PT LONG TERM GOAL #5   Title report 60-70% reduction in neck and lumbar pain with ADLs and self-care   Time 6   Period Weeks   Status On-going               Plan - 02/25/15 1407    Clinical Impression Statement Pt with fatigue over the past few days and requested to end exercise early due to fatigue.  Pt tolerated activity in the clinic well.  Pt able to stand longer when she braces her abdominals.   Pt will benefit from skilled therapeutic intervention in order to improve on the following deficits Difficulty walking;Improper body mechanics;Decreased endurance;Decreased activity tolerance;Pain;Decreased strength;Decreased mobility   Rehab Potential Good   PT Frequency 2x / week   PT Duration 6 weeks   PT Treatment/Interventions ADLs/Self Care Home Management;Moist Heat;Therapeutic activities;Patient/family education;Therapeutic exercise;Neuromuscular re-education;Cryotherapy;Electrical Stimulation   PT Next Visit Plan UE/LE endurance, flexibility exercises, modalties as needed.  Aerobic exercise to improve endurance.     Consulted and Agree with Plan of Care Patient        Problem List Patient Active Problem List   Diagnosis Date Noted  . Sprain of neck 06/26/2013  . Obstructive sleep apnea (adult) (pediatric) 02/04/2013  . Atrial fibrillation 02/04/2013  . Other general symptoms(780.99) 02/04/2013  . Other vitamin B12 deficiency anemia 02/04/2013  . Memory loss 02/04/2013  . Lumbago 02/04/2013    Harriet Sutphen, PT  02/25/2015, 2:33 PM  [facility] [address] [place], [place], [address] Phone: [phone]   Fax:  [phone]

## 2015-03-01 ENCOUNTER — Other Ambulatory Visit: Payer: Self-pay | Admitting: Neurology

## 2015-03-02 ENCOUNTER — Encounter: Payer: Self-pay | Admitting: Physical Therapy

## 2015-03-02 ENCOUNTER — Ambulatory Visit: Payer: Medicare Other | Admitting: Physical Therapy

## 2015-03-02 DIAGNOSIS — M545 Low back pain, unspecified: Secondary | ICD-10-CM

## 2015-03-02 DIAGNOSIS — M542 Cervicalgia: Secondary | ICD-10-CM | POA: Diagnosis not present

## 2015-03-02 DIAGNOSIS — R531 Weakness: Secondary | ICD-10-CM | POA: Diagnosis not present

## 2015-03-02 DIAGNOSIS — R6889 Other general symptoms and signs: Secondary | ICD-10-CM

## 2015-03-02 DIAGNOSIS — I4891 Unspecified atrial fibrillation: Secondary | ICD-10-CM | POA: Diagnosis not present

## 2015-03-02 DIAGNOSIS — M797 Fibromyalgia: Secondary | ICD-10-CM | POA: Diagnosis not present

## 2015-03-02 NOTE — Telephone Encounter (Signed)
Originally filled 06/26/13

## 2015-03-02 NOTE — Therapy (Signed)
[facility] [address], [place], [place], [address] Phone: [phone]   Fax:  [phone]  Physical Therapy Treatment  Patient Details  Name: Debbie [name] MRN: [id number] Date of Birth: 1946/01/22 Referring Provider:  Kathrynn Ducking, MD  Encounter Date: 03/02/2015      PT End of Session - 03/02/15 1415    Visit Number 16   Number of Visits 20   Date for PT Re-Evaluation 04/03/15   PT Start Time 1402   PT Stop Time 1501   PT Time Calculation (min) 59 min   Activity Tolerance Patient tolerated treatment well   Behavior During Therapy [facility] for tasks assessed/performed      Past Medical History  Diagnosis Date  . Hypertension   . Atrial fibrillation   . Depression with anxiety   . Fibromyalgia   . Breast cancer   . Muscle tension headache   . History of partial seizures   . Benign essential tremor   . Obesity   . Drowsiness     Excessive daytime  . Obstructive sleep apnea   . Vitamin B12 deficiency   . Tremors of nervous system   . Dyslipidemia   . Chronic renal insufficiency   . Seizures     Partial  . Memory disturbance   . Sprain of neck 06/26/2013    Past Surgical History  Procedure Laterality Date  . Brain meningioma excision Right   . Mastectomies Bilateral   . Abdominal hysterectomy    . Breast reconstructive    . Breast reconstruction Bilateral   . Lumbosacral    . Lumbar spine surgery N/A   . Tubal ligation N/A   . Gallbladder resection    . Cholecystectomy    . Basal cell carcinoma excision    . Basal cell carcinoma excision    . Nose surgery      Basal cell carcinoma resection from the nose  . Dilution    . Dilation and curettage of uterus    . Ablation procedure      Cardiac, for Afib    There were no vitals filed for this visit.  Visit Diagnosis:  Bilateral low back pain without sciatica  Activity intolerance  Weakness generalized      Subjective Assessment  - 03/02/15 1406    Subjective Pt feels better and is able to tolerate activities in PT sessions   Pertinent History 2 lumbar surgeries.     Limitations Sitting;Standing;Walking   How long can you sit comfortably? 2 hours   How long can you stand comfortably? 30 miin up to 40 min while focusing on abdominal bracing   How long can you walk comfortably? rarely using the walker, using the cane for steps    Diagnostic tests April 2015- MRI per pt report and pt reports degeneration   Currently in Pain? Yes   Pain Score 3    Pain Location Back   Pain Orientation Right;Left   Pain Descriptors / Indicators Aching   Pain Type Chronic pain   Pain Radiating Towards no radiating pain today   Pain Onset More than a month ago   Pain Frequency Constant   Aggravating Factors  standing, bending, walking, sleep on left side   Pain Relieving Factors rest, heat   Effect of Pain on Daily Activities limited standing tolerance and decreased activity tolerance   Multiple Pain Sites No  [facility] Adult PT Treatment/Exercise - 03/02/15 0001    Neck Exercises: Seated   Shoulder Shrugs 20 reps   Shoulder Rolls 20 reps   Lumbar Exercises: Aerobic   Stationary Bike Level 1x 6 minutes   Lumbar Exercises: Standing   Other Standing Lumbar Exercises Focus on abdominal bracing with [facility] stacking to 2nd shelf  2x2min    Lumbar Exercises: Seated   Other Seated Lumbar Exercises hip adduction with ball 5 sec hold 2x10   Other Seated Lumbar Exercises bil hands clasped variation of D2 for abdominal strength 2x10 each side   Lumbar Exercises: Supine   Other Supine Lumbar Exercises TA activation 2 x 10 with 5 sec hold, ER each leg 3 x 53 able to perform marching and leg lengthener today   Shoulder Exercises: Pulleys   Flexion 3 minutes   ABduction 2 minutes  each side   Shoulder Exercises: ROM/Strengthening   Other ROM/Strengthening Exercises 3 way raises 1# added 2x10,   tolerated  2 sets with weigth today   Modalities   Modalities Moist Heat   Moist Heat Therapy   Number Minutes Moist Heat 15 Minutes   Moist Heat Location Other (comment)   Electrical Stimulation   Electrical Stimulation Location low back   Electrical Stimulation Action IFC   Electrical Stimulation Parameters 15   Electrical Stimulation Goals Pain  in supine with head elevated                  PT Short Term Goals - 02/16/15 1540    PT SHORT TERM GOAL #1   Title be independent in initial HEP   Time 4   Period Weeks   Status Achieved   PT SHORT TERM GOAL #2   Title report a 25% reduction in neck and LBP with ADLs and self-care   Time 4   Period Weeks   Status Achieved           PT Long Term Goals - 03/02/15 1427    PT LONG TERM GOAL #1   Title be independent in an advanced HEP   Time 6   Period Weeks   Status On-going   PT LONG TERM GOAL #2   Title reduce FOTO to < or = to 56% limitation   Time 6   Period Weeks   Status On-going   PT LONG TERM GOAL #3   Title report a 40% reduction in neck and LBP with ADLs and self-care   Baseline pt reports a 80% improvement today, but inconsistent   Time 8   Period Weeks   Status Achieved   PT LONG TERM GOAL #4   Title stand for 20 minutes for ADLs and housework without need to rest   Time 6   Period Weeks   Status On-going   PT LONG TERM GOAL #5   Title report 60-70% reduction in neck and lumbar pain with ADLs and self-care  40% improvement   Time 6   Period Weeks   Status On-going               Plan - 03/02/15 1419    Clinical Impression Statement Pt with improved activity tolerance     Pt will benefit from skilled therapeutic intervention in order to improve on the following deficits Difficulty walking;Improper body mechanics;Decreased endurance;Decreased activity tolerance;Pain;Decreased strength;Decreased mobility   Rehab Potential Good   PT Frequency 2x / week   PT Duration 6 weeks   PT  Treatment/Interventions ADLs/Self Care  Home Management;Moist Heat;Therapeutic activities;Patient/family education;Therapeutic exercise;Neuromuscular re-education;Cryotherapy;Electrical Stimulation   PT Next Visit Plan UE/LE endurance, flexibility exercises, modalties as needed.  Aerobic exercise to improve endurance.     PT Home Exercise Plan reviewTA exercises, marching and leg slides only to pt. tolerance   Consulted and Agree with Plan of Care Patient        Problem List Patient Active Problem List   Diagnosis Date Noted  . Sprain of neck 06/26/2013  . Obstructive sleep apnea (adult) (pediatric) 02/04/2013  . Atrial fibrillation 02/04/2013  . Other general symptoms(780.99) 02/04/2013  . Other vitamin B12 deficiency anemia 02/04/2013  . Memory loss 02/04/2013  . Lumbago 02/04/2013    NAUMANN-HOUEGNIFIO,Marlena Barbato PTA 03/02/2015, 3:01 PM  [facility] [address], [place], [place], [address] Phone: [phone]   Fax:  [phone]

## 2015-03-04 ENCOUNTER — Ambulatory Visit: Payer: Medicare Other

## 2015-03-04 DIAGNOSIS — M542 Cervicalgia: Secondary | ICD-10-CM | POA: Diagnosis not present

## 2015-03-04 DIAGNOSIS — I4891 Unspecified atrial fibrillation: Secondary | ICD-10-CM | POA: Diagnosis not present

## 2015-03-04 DIAGNOSIS — R6889 Other general symptoms and signs: Secondary | ICD-10-CM

## 2015-03-04 DIAGNOSIS — M545 Low back pain, unspecified: Secondary | ICD-10-CM

## 2015-03-04 DIAGNOSIS — R531 Weakness: Secondary | ICD-10-CM

## 2015-03-04 DIAGNOSIS — M797 Fibromyalgia: Secondary | ICD-10-CM | POA: Diagnosis not present

## 2015-03-04 NOTE — Therapy (Signed)
[facility] [address], [place], [place], [address] Phone: [phone]   Fax:  [phone]  Physical Therapy Treatment  Patient Details  Name: Debbie [name] MRN: [id number] Date of Birth: 05/07/1946 Referring Provider:  Elba Barman, MD  Encounter Date: 03/04/2015      PT End of Session - 03/04/15 1430    Visit Number 17   Number of Visits 20  Medicare   Date for PT Re-Evaluation 04/03/15   PT Start Time 1400   PT Stop Time 1451   PT Time Calculation (min) 51 min   Activity Tolerance Patient tolerated treatment well   Behavior During Therapy [facility] for tasks assessed/performed      Past Medical History  Diagnosis Date  . Hypertension   . Atrial fibrillation   . Depression with anxiety   . Fibromyalgia   . Breast cancer   . Muscle tension headache   . History of partial seizures   . Benign essential tremor   . Obesity   . Drowsiness     Excessive daytime  . Obstructive sleep apnea   . Vitamin B12 deficiency   . Tremors of nervous system   . Dyslipidemia   . Chronic renal insufficiency   . Seizures     Partial  . Memory disturbance   . Sprain of neck 06/26/2013    Past Surgical History  Procedure Laterality Date  . Brain meningioma excision Right   . Mastectomies Bilateral   . Abdominal hysterectomy    . Breast reconstructive    . Breast reconstruction Bilateral   . Lumbosacral    . Lumbar spine surgery N/A   . Tubal ligation N/A   . Gallbladder resection    . Cholecystectomy    . Basal cell carcinoma excision    . Basal cell carcinoma excision    . Nose surgery      Basal cell carcinoma resection from the nose  . Dilution    . Dilation and curettage of uterus    . Ablation procedure      Cardiac, for Afib    There were no vitals filed for this visit.  Visit Diagnosis:  Bilateral low back pain without sciatica  Activity intolerance  Weakness generalized  Neck pain       Subjective Assessment - 03/04/15 1359    Subjective Pt reports Rt pectoralis strain after doing exercise in clinic last session.     Currently in Pain? Yes   Pain Score 3    Pain Location Back   Pain Orientation Right;Left                         [facility] Adult PT Treatment/Exercise - 03/04/15 0001    Neck Exercises: Seated   Shoulder Shrugs 20 reps  1# added   Shoulder Rolls 20 reps  1# added   Lumbar Exercises: Aerobic   Stationary Bike Level 1x 6 minutes   Lumbar Exercises: Standing   Other Standing Lumbar Exercises Focus on abdominal bracing with [facility] stacking to 2nd shelf  2x66min    Lumbar Exercises: Seated   Other Seated Lumbar Exercises hip adduction with ball 5 sec hold 2x10  TA activation   Shoulder Exercises: Pulleys   Flexion 3 minutes   ABduction 2 minutes  each side   Shoulder Exercises: ROM/Strengthening   Other ROM/Strengthening Exercises 3 way raises 1# added 1x10,    Modalities   Modalities Moist Heat;Electrical  Stimulation   Moist Heat Therapy   Number Minutes Moist Heat 15 Minutes   Moist Heat Location Other (comment)  lumbar   Electrical Stimulation   Electrical Stimulation Location low back   Electrical Stimulation Action IFC   Electrical Stimulation Parameters 15   Electrical Stimulation Goals Pain                  PT Short Term Goals - 02/16/15 1540    PT SHORT TERM GOAL #1   Title be independent in initial HEP   Time 4   Period Weeks   Status Achieved   PT SHORT TERM GOAL #2   Title report a 25% reduction in neck and LBP with ADLs and self-care   Time 4   Period Weeks   Status Achieved           PT Long Term Goals - 03/02/15 1427    PT LONG TERM GOAL #1   Title be independent in an advanced HEP   Time 6   Period Weeks   Status On-going   PT LONG TERM GOAL #2   Title reduce FOTO to < or = to 56% limitation   Time 6   Period Weeks   Status On-going   PT LONG TERM GOAL #3   Title report a 40%  reduction in neck and LBP with ADLs and self-care   Baseline pt reports a 80% improvement today, but inconsistent   Time 8   Period Weeks   Status Achieved   PT LONG TERM GOAL #4   Title stand for 20 minutes for ADLs and housework without need to rest   Time 6   Period Weeks   Status On-going   PT LONG TERM GOAL #5   Title report 60-70% reduction in neck and lumbar pain with ADLs and self-care  40% improvement   Time 6   Period Weeks   Status On-going               Plan - 03/04/15 1406    Clinical Impression Statement Pt with continued chronic pain and limited strength/endurance.  Pt is able to tolerate activity in the clinic without limitation.     Pt will benefit from skilled therapeutic intervention in order to improve on the following deficits Difficulty walking;Improper body mechanics;Decreased endurance;Decreased activity tolerance;Pain;Decreased strength;Decreased mobility   Rehab Potential Good   PT Frequency 2x / week   PT Duration 6 weeks   PT Treatment/Interventions ADLs/Self Care Home Management;Moist Heat;Therapeutic activities;Patient/family education;Therapeutic exercise;Neuromuscular re-education;Cryotherapy;Electrical Stimulation   PT Next Visit Plan UE/LE endurance, flexibility exercises, modalties as needed.  Aerobic exercise to improve endurance.     Consulted and Agree with Plan of Care Patient        Problem List Patient Active Problem List   Diagnosis Date Noted  . Sprain of neck 06/26/2013  . Obstructive sleep apnea (adult) (pediatric) 02/04/2013  . Atrial fibrillation 02/04/2013  . Other general symptoms(780.99) 02/04/2013  . Other vitamin B12 deficiency anemia 02/04/2013  . Memory loss 02/04/2013  . Lumbago 02/04/2013    Karter Hellmer, PT 03/04/2015, 2:36 PM  [facility] [address], [place], [place], [address] Phone: [phone]   Fax:  [phone]

## 2015-03-06 DIAGNOSIS — H00014 Hordeolum externum left upper eyelid: Secondary | ICD-10-CM | POA: Diagnosis not present

## 2015-03-11 ENCOUNTER — Ambulatory Visit: Payer: Medicare Other | Attending: Neurology

## 2015-03-11 DIAGNOSIS — R531 Weakness: Secondary | ICD-10-CM | POA: Insufficient documentation

## 2015-03-11 DIAGNOSIS — I4891 Unspecified atrial fibrillation: Secondary | ICD-10-CM | POA: Diagnosis not present

## 2015-03-11 DIAGNOSIS — M545 Low back pain, unspecified: Secondary | ICD-10-CM

## 2015-03-11 DIAGNOSIS — M542 Cervicalgia: Secondary | ICD-10-CM

## 2015-03-11 DIAGNOSIS — R6889 Other general symptoms and signs: Secondary | ICD-10-CM | POA: Diagnosis not present

## 2015-03-11 DIAGNOSIS — E785 Hyperlipidemia, unspecified: Secondary | ICD-10-CM | POA: Insufficient documentation

## 2015-03-11 DIAGNOSIS — I129 Hypertensive chronic kidney disease with stage 1 through stage 4 chronic kidney disease, or unspecified chronic kidney disease: Secondary | ICD-10-CM | POA: Insufficient documentation

## 2015-03-11 DIAGNOSIS — R413 Other amnesia: Secondary | ICD-10-CM | POA: Insufficient documentation

## 2015-03-11 DIAGNOSIS — N189 Chronic kidney disease, unspecified: Secondary | ICD-10-CM | POA: Diagnosis not present

## 2015-03-11 DIAGNOSIS — M797 Fibromyalgia: Secondary | ICD-10-CM | POA: Insufficient documentation

## 2015-03-11 DIAGNOSIS — E669 Obesity, unspecified: Secondary | ICD-10-CM | POA: Diagnosis not present

## 2015-03-11 DIAGNOSIS — G250 Essential tremor: Secondary | ICD-10-CM | POA: Diagnosis not present

## 2015-03-11 DIAGNOSIS — G4733 Obstructive sleep apnea (adult) (pediatric): Secondary | ICD-10-CM | POA: Insufficient documentation

## 2015-03-11 NOTE — Therapy (Signed)
[facility] [address] [place], [place], [address] Phone: [phone]   Fax:  [phone]  Physical Therapy Treatment  Patient Details  Name: Debbie [name] MRN: [id number] Date of Birth: 1946-08-07 Referring Provider:  Kathrynn Ducking, MD  Encounter Date: 03/11/2015      PT End of Session - 03/11/15 1423    Visit Number 18   Number of Visits 20  Medicare   Date for PT Re-Evaluation 04/03/15   PT Start Time 1400   PT Stop Time 1456   PT Time Calculation (min) 56 min   Activity Tolerance Patient limited by pain   Behavior During Therapy [facility] for tasks assessed/performed      Past Medical History  Diagnosis Date  . Hypertension   . Atrial fibrillation   . Depression with anxiety   . Fibromyalgia   . Breast cancer   . Muscle tension headache   . History of partial seizures   . Benign essential tremor   . Obesity   . Drowsiness     Excessive daytime  . Obstructive sleep apnea   . Vitamin B12 deficiency   . Tremors of nervous system   . Dyslipidemia   . Chronic renal insufficiency   . Seizures     Partial  . Memory disturbance   . Sprain of neck 06/26/2013    Past Surgical History  Procedure Laterality Date  . Brain meningioma excision Right   . Mastectomies Bilateral   . Abdominal hysterectomy    . Breast reconstructive    . Breast reconstruction Bilateral   . Lumbosacral    . Lumbar spine surgery N/A   . Tubal ligation N/A   . Gallbladder resection    . Cholecystectomy    . Basal cell carcinoma excision    . Basal cell carcinoma excision    . Nose surgery      Basal cell carcinoma resection from the nose  . Dilution    . Dilation and curettage of uterus    . Ablation procedure      Cardiac, for Afib    There were no vitals filed for this visit.  Visit Diagnosis:  Neck pain  Bilateral low back pain without sciatica  Activity intolerance      Subjective Assessment - 03/11/15  1401    Subjective Pt reports significant LBP today that started on Saturday.  Pt does not think that she can do much exercise today.     Currently in Pain? Yes   Pain Score 8    Pain Location Back   Pain Orientation Right;Left   Pain Descriptors / Indicators Aching;Dull   Pain Type Chronic pain   Pain Onset More than a month ago   Pain Frequency Constant   Aggravating Factors  all movement   Pain Relieving Factors bracing abdominals, heat, e-stim in clinic                         [facility] Adult PT Treatment/Exercise - 03/11/15 0001    Neck Exercises: Seated   Shoulder Shrugs 20 reps   Shoulder Rolls 20 reps   Other Seated Exercise cervical AROM: sidebending, flexion and rotation bilaterally 3x20 seconds   Knee/Hip Exercises: Stretches   Active Hamstring Stretch 3 reps;20 seconds   Shoulder Exercises: Pulleys   Flexion 3 minutes  PT present to discuss progress   ABduction 2 minutes  each side   Modalities   Modalities  Moist Heat;Electrical Stimulation   Moist Heat Therapy   Number Minutes Moist Heat 25 Minutes   Moist Heat Location Other (comment)  lumbar   Electrical Stimulation   Electrical Stimulation Location low back   Electrical Stimulation Action IFC   Electrical Stimulation Parameters 25   Electrical Stimulation Goals Pain                  PT Short Term Goals - 02/16/15 1540    PT SHORT TERM GOAL #1   Title be independent in initial HEP   Time 4   Period Weeks   Status Achieved   PT SHORT TERM GOAL #2   Title report a 25% reduction in neck and LBP with ADLs and self-care   Time 4   Period Weeks   Status Achieved           PT Long Term Goals - 03/11/15 1403    PT LONG TERM GOAL #1   Title be independent in an advanced HEP   Time 6   Period Weeks   Status On-going   PT LONG TERM GOAL #3   Title report a 40% reduction in neck and LBP with ADLs and self-care   Time 8   Period Weeks   Status Achieved   PT LONG TERM GOAL #5    Title report 60-70% reduction in neck and lumbar pain with ADLs and self-care   Time 6   Period Weeks   Status On-going  Pt with flare-up today so no change in status               Plan - 03/11/15 1405    Clinical Impression Statement Pt with a flare-up over the weekend and limited ability to perform exercise in the clinic today.  Pt with continued chronic pain and limited strength/endurance.   Pt will benefit from skilled therapeutic intervention in order to improve on the following deficits Difficulty walking;Improper body mechanics;Decreased endurance;Decreased activity tolerance;Pain;Decreased strength;Decreased mobility   Rehab Potential Good   PT Frequency 2x / week   PT Duration 6 weeks   PT Treatment/Interventions ADLs/Self Care Home Management;Moist Heat;Therapeutic activities;Patient/family education;Therapeutic exercise;Neuromuscular re-education;Cryotherapy;Electrical Stimulation   PT Next Visit Plan UE/LE endurance, flexibility exercises, modalties as needed.  Aerobic exercise to improve endurance as tolerated.   Consulted and Agree with Plan of Care Patient        Problem List Patient Active Problem List   Diagnosis Date Noted  . Sprain of neck 06/26/2013  . Obstructive sleep apnea (adult) (pediatric) 02/04/2013  . Atrial fibrillation 02/04/2013  . Other general symptoms(780.99) 02/04/2013  . Other vitamin B12 deficiency anemia 02/04/2013  . Memory loss 02/04/2013  . Lumbago 02/04/2013    TAKACS,KELLY, PT 03/11/2015, 2:29 PM  [facility] [address] [place], [place], [address] Phone: [phone]   Fax:  [phone]

## 2015-03-12 ENCOUNTER — Encounter: Payer: Self-pay | Admitting: Physical Therapy

## 2015-03-12 ENCOUNTER — Ambulatory Visit: Payer: Medicare Other | Admitting: Physical Therapy

## 2015-03-12 DIAGNOSIS — R531 Weakness: Secondary | ICD-10-CM | POA: Diagnosis not present

## 2015-03-12 DIAGNOSIS — M545 Low back pain, unspecified: Secondary | ICD-10-CM

## 2015-03-12 DIAGNOSIS — I4891 Unspecified atrial fibrillation: Secondary | ICD-10-CM | POA: Diagnosis not present

## 2015-03-12 DIAGNOSIS — M542 Cervicalgia: Secondary | ICD-10-CM | POA: Diagnosis not present

## 2015-03-12 DIAGNOSIS — M797 Fibromyalgia: Secondary | ICD-10-CM | POA: Diagnosis not present

## 2015-03-12 DIAGNOSIS — R6889 Other general symptoms and signs: Secondary | ICD-10-CM | POA: Diagnosis not present

## 2015-03-12 NOTE — Therapy (Signed)
[facility] [address] [place], [place], [address] Phone: [phone]   Fax:  [phone]  Physical Therapy Treatment  Patient Details  Name: Debbie [name] MRN: [id number] Date of Birth: 07-11-1946 Referring Provider:  Elba Barman, MD  Encounter Date: 03/12/2015      PT End of Session - 03/12/15 1450    Visit Number 19   Number of Visits 20   Date for PT Re-Evaluation 04/03/15   PT Start Time 1402   PT Stop Time 1503   PT Time Calculation (min) 61 min   Activity Tolerance Patient limited by pain;Patient tolerated treatment well   Behavior During Therapy [facility] for tasks assessed/performed      Past Medical History  Diagnosis Date  . Hypertension   . Atrial fibrillation   . Depression with anxiety   . Fibromyalgia   . Breast cancer   . Muscle tension headache   . History of partial seizures   . Benign essential tremor   . Obesity   . Drowsiness     Excessive daytime  . Obstructive sleep apnea   . Vitamin B12 deficiency   . Tremors of nervous system   . Dyslipidemia   . Chronic renal insufficiency   . Seizures     Partial  . Memory disturbance   . Sprain of neck 06/26/2013    Past Surgical History  Procedure Laterality Date  . Brain meningioma excision Right   . Mastectomies Bilateral   . Abdominal hysterectomy    . Breast reconstructive    . Breast reconstruction Bilateral   . Lumbosacral    . Lumbar spine surgery N/A   . Tubal ligation N/A   . Gallbladder resection    . Cholecystectomy    . Basal cell carcinoma excision    . Basal cell carcinoma excision    . Nose surgery      Basal cell carcinoma resection from the nose  . Dilution    . Dilation and curettage of uterus    . Ablation procedure      Cardiac, for Afib    There were no vitals filed for this visit.  Visit Diagnosis:  Neck pain  Bilateral low back pain without sciatica  Activity intolerance  Weakness  generalized      Subjective Assessment - 03/12/15 1405    Subjective Patient feels slightly better than yesterday, the heat feelt great   Pertinent History 2 lumbar surgeries.     Limitations Sitting;Standing;Walking   How long can you sit comfortably? 2 hours   How long can you stand comfortably? 30 miin up to 40 min while focusing on abdominal bracing   How long can you walk comfortably? rarely using the walker, using the cane for steps    Diagnostic tests April 2015- MRI per pt report and pt reports degeneration   Currently in Pain? Yes   Pain Score 6    Pain Location Back   Pain Orientation Right;Left   Pain Type Chronic pain   Pain Onset More than a month ago   Pain Frequency Constant   Aggravating Factors  all movement   Pain Relieving Factors bracing abdominals, heat, e-stime in clinic   Effect of Pain on Daily Activities limited standing tolerance and decreased activity tolerance   Multiple Pain Sites No                         [facility] Adult PT  Treatment/Exercise - 03/12/15 0001    Neck Exercises: Seated   Shoulder Shrugs 20 reps   Shoulder Rolls 20 reps   Other Seated Exercise cervical AROM: sidebending, flexion and rotation bilaterally 3x20 seconds   Lumbar Exercises: Aerobic   Stationary Bike Level 1x 6 minutes   Lumbar Exercises: Seated   Other Seated Lumbar Exercises hip adduction with ball 5 sec hold 2x10   Other Seated Lumbar Exercises D2 for abdominal strength 1x10 with orange ball in sitting   Knee/Hip Exercises: Stretches   Active Hamstring Stretch 3 reps;20 seconds  each leg in sitting   Knee/Hip Exercises: Standing   Other Standing Knee Exercises standing hip extension 2.5# with abdominal bracing 2x10 each  unable to perform abduction    Shoulder Exercises: Pulleys   Flexion 3 minutes   ABduction 2 minutes  each side   Shoulder Exercises: ROM/Strengthening   Other ROM/Strengthening Exercises 3 way raises 1# added 1x10 each     Modalities   Modalities Moist Heat;Electrical Stimulation   Moist Heat Therapy   Number Minutes Moist Heat 15 Minutes   Moist Heat Location Other (comment)   Electrical Stimulation   Electrical Stimulation Location low back   Electrical Stimulation Action IFC   Electrical Stimulation Parameters 15   Electrical Stimulation Goals Pain                  PT Short Term Goals - 02/16/15 1540    PT SHORT TERM GOAL #1   Title be independent in initial HEP   Time 4   Period Weeks   Status Achieved   PT SHORT TERM GOAL #2   Title report a 25% reduction in neck and LBP with ADLs and self-care   Time 4   Period Weeks   Status Achieved           PT Long Term Goals - 03/11/15 1403    PT LONG TERM GOAL #1   Title be independent in an advanced HEP   Time 6   Period Weeks   Status On-going   PT LONG TERM GOAL #3   Title report a 40% reduction in neck and LBP with ADLs and self-care   Time 8   Period Weeks   Status Achieved   PT LONG TERM GOAL #5   Title report 60-70% reduction in neck and lumbar pain with ADLs and self-care   Time 6   Period Weeks   Status On-going  Pt with flare-up today so no change in status               Plan - 03/12/15 1451    Clinical Impression Statement Pt feeling better than yesterday.    Pt will benefit from skilled therapeutic intervention in order to improve on the following deficits Difficulty walking;Improper body mechanics;Decreased endurance;Decreased activity tolerance;Pain;Decreased strength;Decreased mobility   Rehab Potential Good   PT Frequency 2x / week   PT Duration 6 weeks   PT Treatment/Interventions ADLs/Self Care Home Management;Moist Heat;Therapeutic activities;Patient/family education;Therapeutic exercise;Neuromuscular re-education;Cryotherapy;Electrical Stimulation   PT Next Visit Plan UE/LE endurance, flexibility exercises, modalties as needed.  Aerobic exercise to improve endurance as tolerated.   PT Home  Exercise Plan reviewTA exercises, marching and leg slides only to pt. tolerance   Consulted and Agree with Plan of Care Patient        Problem List Patient Active Problem List   Diagnosis Date Noted  . Sprain of neck 06/26/2013  . Obstructive sleep apnea (adult) (pediatric)  02/04/2013  . Atrial fibrillation 02/04/2013  . Other general symptoms(780.99) 02/04/2013  . Other vitamin B12 deficiency anemia 02/04/2013  . Memory loss 02/04/2013  . Lumbago 02/04/2013    NAUMANN-HOUEGNIFIO,Azie Mcconahy PTA 03/12/2015, 2:58 PM  [facility] [address] [place], [place], [address] Phone: [phone]   Fax:  [phone]

## 2015-03-16 ENCOUNTER — Encounter: Payer: Self-pay | Admitting: Physical Therapy

## 2015-03-16 ENCOUNTER — Ambulatory Visit: Payer: Medicare Other | Admitting: Physical Therapy

## 2015-03-16 DIAGNOSIS — M797 Fibromyalgia: Secondary | ICD-10-CM | POA: Diagnosis not present

## 2015-03-16 DIAGNOSIS — R531 Weakness: Secondary | ICD-10-CM

## 2015-03-16 DIAGNOSIS — M542 Cervicalgia: Secondary | ICD-10-CM | POA: Diagnosis not present

## 2015-03-16 DIAGNOSIS — I4891 Unspecified atrial fibrillation: Secondary | ICD-10-CM | POA: Diagnosis not present

## 2015-03-16 DIAGNOSIS — M545 Low back pain, unspecified: Secondary | ICD-10-CM

## 2015-03-16 DIAGNOSIS — R6889 Other general symptoms and signs: Secondary | ICD-10-CM | POA: Diagnosis not present

## 2015-03-16 NOTE — Therapy (Signed)
[facility] [address], [place], [place], [address] Phone: [phone]   Fax:  [phone]  Physical Therapy Treatment  Patient Details  Name: Debbie [name] MRN: [id number] Date of Birth: [birth year] Referring Provider:  Elba Barman, MD  Encounter Date: 03/16/2015      PT End of Session - 03/16/15 1213    Visit Number 20   Number of Visits 20   Date for PT Re-Evaluation 04/03/15   PT Start Time 1146   PT Stop Time 7253   PT Time Calculation (min) 61 min   Activity Tolerance Patient limited by pain;Patient tolerated treatment well   Behavior During Therapy [facility] for tasks assessed/performed      Past Medical History  Diagnosis Date  . Hypertension   . Atrial fibrillation   . Depression with anxiety   . Fibromyalgia   . Breast cancer   . Muscle tension headache   . History of partial seizures   . Benign essential tremor   . Obesity   . Drowsiness     Excessive daytime  . Obstructive sleep apnea   . Vitamin B12 deficiency   . Tremors of nervous system   . Dyslipidemia   . Chronic renal insufficiency   . Seizures     Partial  . Memory disturbance   . Sprain of neck 06/26/2013    Past Surgical History  Procedure Laterality Date  . Brain meningioma excision Right   . Mastectomies Bilateral   . Abdominal hysterectomy    . Breast reconstructive    . Breast reconstruction Bilateral   . Lumbosacral    . Lumbar spine surgery N/A   . Tubal ligation N/A   . Gallbladder resection    . Cholecystectomy    . Basal cell carcinoma excision    . Basal cell carcinoma excision    . Nose surgery      Basal cell carcinoma resection from the nose  . Dilution    . Dilation and curettage of uterus    . Ablation procedure      Cardiac, for Afib    There were no vitals filed for this visit.  Visit Diagnosis:  Bilateral low back pain without sciatica  Activity intolerance  Weakness generalized       Subjective Assessment - 03/16/15 1209    Subjective Patient reports feels very fatique today   Pertinent History 2 lumbar surgeries.     Limitations Sitting;Standing;Walking   How long can you sit comfortably? 2 hours   How long can you stand comfortably? 30 miin up to 40 min while focusing on abdominal bracing   How long can you walk comfortably? rarely using the walker, using the cane for steps    Diagnostic tests April 2015- MRI per pt report and pt reports degeneration   Currently in Pain? Yes   Pain Score 8    Pain Location Back   Pain Orientation Right;Left   Pain Descriptors / Indicators Aching;Dull   Pain Type Chronic pain   Pain Onset More than a month ago   Pain Frequency Constant   Multiple Pain Sites No            [facility] PT Assessment - 03/16/15 0001    Observation/Other Assessments   Focus on Therapeutic Outcomes (FOTO)  65% limitations                     [facility] Adult PT Treatment/Exercise - 03/16/15 0001  Neck Exercises: Seated   Shoulder Shrugs 20 reps;10 reps   Shoulder Rolls 20 reps;10 reps   Lumbar Exercises: Aerobic   Stationary Bike Level 1x 8 minutes   Knee/Hip Exercises: Stretches   Active Hamstring Stretch 3 reps;20 seconds;Other (comment)  in sitting   Shoulder Exercises: Pulleys   Flexion 3 minutes   ABduction 2 minutes  each UE into abduction   Modalities   Modalities Moist Heat;Electrical Stimulation   Moist Heat Therapy   Number Minutes Moist Heat 15 Minutes   Moist Heat Location Other (comment)   Electrical Stimulation   Electrical Stimulation Location low back   Electrical Stimulation Action IFC   Electrical Stimulation Parameters 15   Electrical Stimulation Goals Pain   Manual Therapy   Manual Therapy Myofascial release  Soft tissue work to bil paraspinals in Lt sidelying                  PT Short Term Goals - Mar 24, 2015 1235    PT SHORT TERM GOAL #1   Title be independent in initial HEP   Time 4    Period Weeks   Status Achieved   PT SHORT TERM GOAL #2   Title report a 25% reduction in neck and LBP with ADLs and self-care   Time 4   Period Weeks   Status Achieved           PT Long Term Goals - 2015/03/24 1236    PT LONG TERM GOAL #1   Title be independent in an advanced HEP   Time 6   Period Weeks   Status On-going   PT LONG TERM GOAL #2   Title reduce FOTO to < or = to 56% limitation  As of 03/24/15 65% limitations    Time 6   Period Weeks   Status On-going   PT LONG TERM GOAL #3   Title report a 40% reduction in neck and LBP with ADLs and self-care   Baseline pt reports a 80% improvement today, but inconsistent   Time 8   Period Weeks   Status Achieved   PT LONG TERM GOAL #4   Title stand for 20 minutes for ADLs and housework without need to rest   Time 6   Period Weeks   Status On-going   PT LONG TERM GOAL #5   Title report 60-70% reduction in neck and lumbar pain with ADLs and self-care   Time 6   Period Weeks   Status On-going               Plan - 03-24-15 1222    Clinical Impression Statement Pt does not feel good today, just very fatique believes it is due to change in meds and influence on bloodpressure   Pt will benefit from skilled therapeutic intervention in order to improve on the following deficits Difficulty walking;Improper body mechanics;Decreased endurance;Decreased activity tolerance;Pain;Decreased strength;Decreased mobility   Rehab Potential Good   PT Frequency 2x / week   PT Duration 6 weeks   PT Treatment/Interventions ADLs/Self Care Home Management;Moist Heat;Therapeutic activities;Patient/family education;Therapeutic exercise;Neuromuscular re-education;Cryotherapy;Electrical Stimulation   PT Next Visit Plan UE/LE endurance, flexibility exercises, modalties as needed.  Aerobic exercise to improve endurance as tolerated all to patient tolerance   PT Home Exercise Plan reviewTA exercises, marching and leg slides only to pt. tolerance    Consulted and Agree with Plan of Care Patient          G-Codes - 2015/03/24 1230  Functional Assessment Tool Used FOTO: 65% limitation & clinical judgement   Functional Limitation Other PT primary   Other PT Primary Current Status (N8676) At least 40 percent but less than 60 percent impaired, limited or restricted   Other PT Primary Goal Status (H2094) At least 40 percent but less than 60 percent impaired, limited or restricted      Problem List Patient Active Problem List   Diagnosis Date Noted  . Sprain of neck 06/26/2013  . Obstructive sleep apnea (adult) (pediatric) 02/04/2013  . Atrial fibrillation 02/04/2013  . Other general symptoms(780.99) 02/04/2013  . Other vitamin B12 deficiency anemia 02/04/2013  . Memory loss 02/04/2013  . Lumbago 02/04/2013    NAUMANN-HOUEGNIFIO,Carsin Randazzo PTA 03/16/2015, 1:42 PM   [facility] [address], [place], [place], [address] Phone: [phone]   Fax:  [phone]

## 2015-03-18 ENCOUNTER — Ambulatory Visit: Payer: Medicare Other | Admitting: Physical Therapy

## 2015-03-18 ENCOUNTER — Encounter: Payer: Self-pay | Admitting: Physical Therapy

## 2015-03-18 DIAGNOSIS — R531 Weakness: Secondary | ICD-10-CM

## 2015-03-18 DIAGNOSIS — I4891 Unspecified atrial fibrillation: Secondary | ICD-10-CM | POA: Diagnosis not present

## 2015-03-18 DIAGNOSIS — M545 Low back pain, unspecified: Secondary | ICD-10-CM

## 2015-03-18 DIAGNOSIS — M797 Fibromyalgia: Secondary | ICD-10-CM | POA: Diagnosis not present

## 2015-03-18 DIAGNOSIS — R6889 Other general symptoms and signs: Secondary | ICD-10-CM | POA: Diagnosis not present

## 2015-03-18 DIAGNOSIS — M542 Cervicalgia: Secondary | ICD-10-CM | POA: Diagnosis not present

## 2015-03-18 NOTE — Therapy (Signed)
[facility] [address] [place], [place], [address] Phone: [phone]   Fax:  [phone]  Physical Therapy Treatment  Patient Details  Name: Debbie [name] MRN: [id number] Date of Birth: Sep 05, 1946 Referring Provider:  Elba Barman, MD  Encounter Date: 03/18/2015      PT End of Session - 03/18/15 1342    Visit Number 21   Number of Visits 30   Date for PT Re-Evaluation 04/03/15   PT Start Time 2725   PT Stop Time 1246   PT Time Calculation (min) 58 min   Activity Tolerance Patient limited by pain;Patient tolerated treatment well   Behavior During Therapy [facility] for tasks assessed/performed      Past Medical History  Diagnosis Date  . Hypertension   . Atrial fibrillation   . Depression with anxiety   . Fibromyalgia   . Breast cancer   . Muscle tension headache   . History of partial seizures   . Benign essential tremor   . Obesity   . Drowsiness     Excessive daytime  . Obstructive sleep apnea   . Vitamin B12 deficiency   . Tremors of nervous system   . Dyslipidemia   . Chronic renal insufficiency   . Seizures     Partial  . Memory disturbance   . Sprain of neck 06/26/2013    Past Surgical History  Procedure Laterality Date  . Brain meningioma excision Right   . Mastectomies Bilateral   . Abdominal hysterectomy    . Breast reconstructive    . Breast reconstruction Bilateral   . Lumbosacral    . Lumbar spine surgery N/A   . Tubal ligation N/A   . Gallbladder resection    . Cholecystectomy    . Basal cell carcinoma excision    . Basal cell carcinoma excision    . Nose surgery      Basal cell carcinoma resection from the nose  . Dilution    . Dilation and curettage of uterus    . Ablation procedure      Cardiac, for Afib    There were no vitals filed for this visit.  Visit Diagnosis:  Bilateral low back pain without sciatica  Activity intolerance  Weakness generalized       Subjective Assessment - 03/18/15 1200    Subjective Patient reports pain in low back today limites with sitting and standing    Pertinent History 2 lumbar surgeries.     Limitations Sitting;Standing;Walking   Currently in Pain? Yes   Pain Score 6    Pain Location Back   Pain Orientation Right;Left;Mid   Pain Descriptors / Indicators Aching;Dull   Pain Type Chronic pain   Pain Onset More than a month ago   Pain Frequency Constant   Aggravating Factors  all movements   Pain Relieving Factors bracing abdominals, heat, e-stim in clinic   Effect of Pain on Daily Activities limited standing tolerance and decreased activity tolerance   Multiple Pain Sites No                         [facility] Adult PT Treatment/Exercise - 03/18/15 0001    Neck Exercises: Seated   Shoulder Shrugs 20 reps;10 reps   Shoulder Rolls 20 reps;10 reps   Other Seated Exercise triceps bil in sitting with 1#   Lumbar Exercises: Aerobic   Stationary Bike Level 1x 6 minutes   Lumbar Exercises: Seated  Other Seated Lumbar Exercises hip adduction with ball 5 sec hold 2x10   Knee/Hip Exercises: Stretches   Active Hamstring Stretch 3 reps;20 seconds;Other (comment)  each leg   Shoulder Exercises: ROM/Strengthening   Other ROM/Strengthening Exercises Biceps 3x10 with 2# weight   Electrical Stimulation   Electrical Stimulation Location low back   Electrical Stimulation Action IFC   Electrical Stimulation Parameters 15   Electrical Stimulation Goals Pain   Manual Therapy   Manual Therapy Myofascial release;Other (comment)  bil paraspinals in Left sidelying                  PT Short Term Goals - 03/16/15 1235    PT SHORT TERM GOAL #1   Title be independent in initial HEP   Time 4   Period Weeks   Status Achieved   PT SHORT TERM GOAL #2   Title report a 25% reduction in neck and LBP with ADLs and self-care   Time 4   Period Weeks   Status Achieved           PT Long Term  Goals - 03/16/15 1236    PT LONG TERM GOAL #1   Title be independent in an advanced HEP   Time 6   Period Weeks   Status On-going   PT LONG TERM GOAL #2   Title reduce FOTO to < or = to 56% limitation  As of 03/16/15 65% limitations    Time 6   Period Weeks   Status On-going   PT LONG TERM GOAL #3   Title report a 40% reduction in neck and LBP with ADLs and self-care   Baseline pt reports a 80% improvement today, but inconsistent   Time 8   Period Weeks   Status Achieved   PT LONG TERM GOAL #4   Title stand for 20 minutes for ADLs and housework without need to rest   Time 6   Period Weeks   Status On-going   PT LONG TERM GOAL #5   Title report 60-70% reduction in neck and lumbar pain with ADLs and self-care   Time 6   Period Weeks   Status On-going               Plan - 03/18/15 1343    Clinical Impression Statement Pt feels able to perform activities in PT session, despite complaining about back pain.    Pt will benefit from skilled therapeutic intervention in order to improve on the following deficits Difficulty walking;Improper body mechanics;Decreased endurance;Decreased activity tolerance;Pain;Decreased strength;Decreased mobility   Rehab Potential Good   PT Frequency 2x / week   PT Duration 6 weeks   PT Treatment/Interventions ADLs/Self Care Home Management;Moist Heat;Therapeutic activities;Patient/family education;Therapeutic exercise;Neuromuscular re-education;Cryotherapy;Electrical Stimulation   PT Next Visit Plan Next week D/C to HEP   PT Home Exercise Plan reviewTA exercises, marching and leg slides only to pt. tolerance   Consulted and Agree with Plan of Care Patient        Problem List Patient Active Problem List   Diagnosis Date Noted  . Sprain of neck 06/26/2013  . Obstructive sleep apnea (adult) (pediatric) 02/04/2013  . Atrial fibrillation 02/04/2013  . Other general symptoms(780.99) 02/04/2013  . Other vitamin B12 deficiency anemia 02/04/2013   . Memory loss 02/04/2013  . Lumbago 02/04/2013    NAUMANN-HOUEGNIFIO,Temperence Zenor PTA 03/18/2015, 1:46 PM  [facility] [address] [place], [place], [address] Phone: [phone]   Fax:  [phone]

## 2015-03-23 ENCOUNTER — Ambulatory Visit: Payer: Medicare Other | Admitting: Physical Therapy

## 2015-03-23 ENCOUNTER — Encounter: Payer: Self-pay | Admitting: Physical Therapy

## 2015-03-23 DIAGNOSIS — R531 Weakness: Secondary | ICD-10-CM | POA: Diagnosis not present

## 2015-03-23 DIAGNOSIS — I4891 Unspecified atrial fibrillation: Secondary | ICD-10-CM | POA: Diagnosis not present

## 2015-03-23 DIAGNOSIS — R6889 Other general symptoms and signs: Secondary | ICD-10-CM

## 2015-03-23 DIAGNOSIS — M545 Low back pain, unspecified: Secondary | ICD-10-CM

## 2015-03-23 DIAGNOSIS — M797 Fibromyalgia: Secondary | ICD-10-CM | POA: Diagnosis not present

## 2015-03-23 DIAGNOSIS — M542 Cervicalgia: Secondary | ICD-10-CM | POA: Diagnosis not present

## 2015-03-23 NOTE — Therapy (Signed)
[facility] [address] [place], [place], [address] Phone: [phone]   Fax:  [phone]  Physical Therapy Treatment  Patient Details  Name: Debbie [name] MRN: [id number] Date of Birth: 07-25-1946 Referring Provider:  Elba Barman, MD  Encounter Date: 03/23/2015      PT End of Session - 03/23/15 1209    Visit Number 22   Number of Visits 30   Date for PT Re-Evaluation 04/03/15   PT Start Time 1146   PT Stop Time 3559   PT Time Calculation (min) 62 min   Activity Tolerance Patient limited by pain;Patient tolerated treatment well   Behavior During Therapy [facility] for tasks assessed/performed      Past Medical History  Diagnosis Date  . Hypertension   . Atrial fibrillation   . Depression with anxiety   . Fibromyalgia   . Breast cancer   . Muscle tension headache   . History of partial seizures   . Benign essential tremor   . Obesity   . Drowsiness     Excessive daytime  . Obstructive sleep apnea   . Vitamin B12 deficiency   . Tremors of nervous system   . Dyslipidemia   . Chronic renal insufficiency   . Seizures     Partial  . Memory disturbance   . Sprain of neck 06/26/2013    Past Surgical History  Procedure Laterality Date  . Brain meningioma excision Right   . Mastectomies Bilateral   . Abdominal hysterectomy    . Breast reconstructive    . Breast reconstruction Bilateral   . Lumbosacral    . Lumbar spine surgery N/A   . Tubal ligation N/A   . Gallbladder resection    . Cholecystectomy    . Basal cell carcinoma excision    . Basal cell carcinoma excision    . Nose surgery      Basal cell carcinoma resection from the nose  . Dilution    . Dilation and curettage of uterus    . Ablation procedure      Cardiac, for Afib    There were no vitals filed for this visit.  Visit Diagnosis:  Bilateral low back pain without sciatica  Activity intolerance  Weakness generalized       Subjective Assessment - 03/23/15 1156    Subjective Pt with limited activity tolerance due to increase pain in low back rated as 8/10   Pertinent History 2 lumbar surgeries.     Limitations Sitting;Standing;Walking   Currently in Pain? Yes   Pain Score 7    Pain Location Back   Pain Orientation Right;Left;Mid  radiating into bil LE   Pain Descriptors / Indicators Aching;Dull   Pain Type Chronic pain   Pain Radiating Towards Rt LE radiating pain down to Rt ankle, Lt LE radiating pain down to toes    Pain Onset More than a month ago   Pain Frequency Constant   Multiple Pain Sites No                         [facility] Adult PT Treatment/Exercise - 03/23/15 0001    Neck Exercises: Seated   Shoulder Shrugs 20 reps;10 reps   Shoulder Rolls 20 reps;10 reps   Lumbar Exercises: Aerobic   Stationary Bike attemted but unable to perform today, due to back pain   Knee/Hip Exercises: Stretches   Active Hamstring Stretch 3 reps;20 seconds;Other (comment)  Shoulder Exercises: Pulleys   Flexion 3 minutes   ABduction 3 minutes  each UE   Shoulder Exercises: ROM/Strengthening   Other ROM/Strengthening Exercises 3 way raises no weight today 2 x 10   Other ROM/Strengthening Exercises Biceps 3x10    Modalities   Modalities Moist Heat   Moist Heat Therapy   Number Minutes Moist Heat 15 Minutes   Moist Heat Location Other (comment)  back   Electrical Stimulation   Electrical Stimulation Location low back   Electrical Stimulation Action IFC   Electrical Stimulation Parameters 15   Electrical Stimulation Goals Pain   Manual Therapy   Manual Therapy Myofascial release;Other (comment)                  PT Short Term Goals - 03/16/15 1235    PT SHORT TERM GOAL #1   Title be independent in initial HEP   Time 4   Period Weeks   Status Achieved   PT SHORT TERM GOAL #2   Title report a 25% reduction in neck and LBP with ADLs and self-care   Time 4   Period Weeks    Status Achieved           PT Long Term Goals - 03/23/15 1212    PT LONG TERM GOAL #1   Title be independent in an advanced HEP   Time 6   Period Weeks   Status On-going   PT LONG TERM GOAL #2   Title reduce FOTO to < or = to 56% limitation   Time 6   Period Weeks   Status On-going   PT LONG TERM GOAL #3   Title report a 40% reduction in neck and LBP with ADLs and self-care   Time 8   Period Weeks   Status On-going   PT LONG TERM GOAL #4   Title stand for 20 minutes for ADLs and housework without need to rest   Time 6   Period Weeks   Status On-going               Plan - 03/23/15 1209    Clinical Impression Statement Pt has increased back pain today, unknown what triggers the flare up   Pt will benefit from skilled therapeutic intervention in order to improve on the following deficits Difficulty walking;Improper body mechanics;Decreased endurance;Decreased activity tolerance;Pain;Decreased strength;Decreased mobility   Rehab Potential Good   PT Frequency 2x / week   PT Duration 6 weeks   PT Treatment/Interventions ADLs/Self Care Home Management;Moist Heat;Therapeutic activities;Patient/family education;Therapeutic exercise;Neuromuscular re-education;Cryotherapy;Electrical Stimulation   PT Next Visit Plan D/C next visit   PT Home Exercise Plan continue current HEP, TA activation Hamstring stretch   Consulted and Agree with Plan of Care Patient        Problem List Patient Active Problem List   Diagnosis Date Noted  . Sprain of neck 06/26/2013  . Obstructive sleep apnea (adult) (pediatric) 02/04/2013  . Atrial fibrillation 02/04/2013  . Other general symptoms(780.99) 02/04/2013  . Other vitamin B12 deficiency anemia 02/04/2013  . Memory loss 02/04/2013  . Lumbago 02/04/2013    NAUMANN-HOUEGNIFIO,Maralyn Witherell PTA 03/23/2015, 12:39 PM   [facility] [address] [place], [place], [address] Phone:  [phone]   Fax:  [phone]

## 2015-03-25 ENCOUNTER — Other Ambulatory Visit: Payer: Self-pay | Admitting: Neurology

## 2015-03-26 ENCOUNTER — Ambulatory Visit: Payer: Medicare Other

## 2015-03-26 DIAGNOSIS — I4891 Unspecified atrial fibrillation: Secondary | ICD-10-CM | POA: Diagnosis not present

## 2015-03-26 DIAGNOSIS — M545 Low back pain, unspecified: Secondary | ICD-10-CM

## 2015-03-26 DIAGNOSIS — M797 Fibromyalgia: Secondary | ICD-10-CM | POA: Diagnosis not present

## 2015-03-26 DIAGNOSIS — R531 Weakness: Secondary | ICD-10-CM

## 2015-03-26 DIAGNOSIS — R6889 Other general symptoms and signs: Secondary | ICD-10-CM

## 2015-03-26 DIAGNOSIS — M542 Cervicalgia: Secondary | ICD-10-CM

## 2015-03-26 NOTE — Therapy (Signed)
[facility] [address] [place], [place], [address] Phone: [phone]   Fax:  [phone]  Physical Therapy Treatment  Patient Details  Name: Debbie [name] MRN: [id number] Date of Birth: 01-08-1946 Referring Provider:  Kathrynn Ducking, MD  Encounter Date: 03/26/2015      PT End of Session - 03/26/15 1216    Visit Number 23   PT Start Time 1791   PT Stop Time 1232   PT Time Calculation (min) 48 min   Activity Tolerance Patient tolerated treatment well   Behavior During Therapy [facility] for tasks assessed/performed      Past Medical History  Diagnosis Date  . Hypertension   . Atrial fibrillation   . Depression with anxiety   . Fibromyalgia   . Breast cancer   . Muscle tension headache   . History of partial seizures   . Benign essential tremor   . Obesity   . Drowsiness     Excessive daytime  . Obstructive sleep apnea   . Vitamin B12 deficiency   . Tremors of nervous system   . Dyslipidemia   . Chronic renal insufficiency   . Seizures     Partial  . Memory disturbance   . Sprain of neck 06/26/2013    Past Surgical History  Procedure Laterality Date  . Brain meningioma excision Right   . Mastectomies Bilateral   . Abdominal hysterectomy    . Breast reconstructive    . Breast reconstruction Bilateral   . Lumbosacral    . Lumbar spine surgery N/A   . Tubal ligation N/A   . Gallbladder resection    . Cholecystectomy    . Basal cell carcinoma excision    . Basal cell carcinoma excision    . Nose surgery      Basal cell carcinoma resection from the nose  . Dilution    . Dilation and curettage of uterus    . Ablation procedure      Cardiac, for Afib    There were no vitals filed for this visit.  Visit Diagnosis:  Bilateral low back pain without sciatica  Activity intolerance  Weakness generalized  Neck pain      Subjective Assessment - 03/26/15 1146    Subjective Pt reports that  her fibromyalgia is flaring up today.   Currently in Pain? Yes   Pain Score 5    Pain Location Back   Pain Orientation Right;Left;Lower;Mid   Pain Descriptors / Indicators Aching;Dull            [facility] PT Assessment - 03/26/15 0001    Observation/Other Assessments   Focus on Therapeutic Outcomes (FOTO)  65% limitations                     [facility] Adult PT Treatment/Exercise - 03/26/15 0001    Neck Exercises: Seated   Shoulder Shrugs 20 reps;10 reps   Shoulder Rolls 20 reps;10 reps   Lumbar Exercises: Aerobic   Stationary Bike Level    Knee/Hip Exercises: Stretches   Active Hamstring Stretch 3 reps;20 seconds;Other (comment)   Shoulder Exercises: Pulleys   Flexion 3 minutes   ABduction Other (comment)  1.5 minutes each.  Stopped early due to pain   Modalities   Modalities Electrical Stimulation;Moist Heat   Moist Heat Therapy   Number Minutes Moist Heat 15 Minutes   Moist Heat Location Lumbar Spine   Electrical Stimulation   Electrical Stimulation Location low back  Electrical Stimulation Action IFC   Electrical Stimulation Parameters 15   Electrical Stimulation Goals Pain                  PT Short Term Goals - 03/16/15 1235    PT SHORT TERM GOAL #1   Title be independent in initial HEP   Time 4   Period Weeks   Status Achieved   PT SHORT TERM GOAL #2   Title report a 25% reduction in neck and LBP with ADLs and self-care   Time 4   Period Weeks   Status Achieved           PT Long Term Goals - 04/09/15 1147    PT LONG TERM GOAL #1   Title be independent in an advanced HEP   Status Achieved   PT LONG TERM GOAL #2   Title reduce FOTO to < or = to 56% limitation   Status Partially Met  65% limitation   PT LONG TERM GOAL #3   Title report a 40% reduction in neck and LBP with ADLs and self-care   Status Achieved  100% improvement of neck pain, 80% LBP   PT LONG TERM GOAL #4   Title stand for 20 minutes for ADLs and housework  without need to rest   Status Achieved  20 minutes reported   PT LONG TERM GOAL #5   Title report 60-70% reduction in neck and lumbar pain with ADLs and self-care   Status Achieved  80% low back, neck 100%               Plan - April 09, 2015 1150    Clinical Impression Statement Pt reports 80% improvement in lumbar symptoms and 100% improvement in neck symptoms.  Pt with continued chronic pain associated with fibromyalgia that limits her function with housework and ADLs at times.  Pt feels stronger and is independent with her exercises.   PT Next Visit Plan D/C PT to HEP   Consulted and Agree with Plan of Care Patient          G-Codes - 2015/04/09 1152    Functional Assessment Tool Used FOTO: 65% limitation & clinical judgement   Functional Limitation Other PT primary   Other PT Primary Goal Status (R6045) At least 40 percent but less than 60 percent impaired, limited or restricted   Other PT Primary Discharge Status (W0981) At least 40 percent but less than 60 percent impaired, limited or restricted      Problem List Patient Active Problem List   Diagnosis Date Noted  . Sprain of neck 06/26/2013  . Obstructive sleep apnea (adult) (pediatric) 02/04/2013  . Atrial fibrillation 02/04/2013  . Other general symptoms(780.99) 02/04/2013  . Other vitamin B12 deficiency anemia 02/04/2013  . Memory loss 02/04/2013  . Lumbago 02/04/2013    PHYSICAL THERAPY DISCHARGE SUMMARY  Visits from Start of Care: 23  Current functional level related to goals / functional outcomes: See goals for current status.   Remaining deficits: Pt with chronic pain that limits her ability to perform endurance tasks.  Pt has HEP in place for continued strength and flexibility.  Pt will follow-up with MD as needed.     Education / Equipment: Body mechanics and HEP  Plan: Patient agrees to discharge.  Patient goals were met. Patient is being discharged due to meeting the stated rehab goals.  ?????    Sigurd Sos, PT 04/09/15 12:22 PM   [facility] [address], [place], [place], [address] Phone: [phone]   Fax:  [phone]

## 2015-05-07 ENCOUNTER — Encounter: Payer: Self-pay | Admitting: Neurology

## 2015-05-07 ENCOUNTER — Ambulatory Visit (INDEPENDENT_AMBULATORY_CARE_PROVIDER_SITE_OTHER): Payer: Medicare Other | Admitting: Neurology

## 2015-05-07 VITALS — BP 113/54 | HR 63 | Ht 70.0 in | Wt 236.2 lb

## 2015-05-07 DIAGNOSIS — G43009 Migraine without aura, not intractable, without status migrainosus: Secondary | ICD-10-CM | POA: Diagnosis not present

## 2015-05-07 DIAGNOSIS — M545 Low back pain, unspecified: Secondary | ICD-10-CM

## 2015-05-07 DIAGNOSIS — G40909 Epilepsy, unspecified, not intractable, without status epilepticus: Secondary | ICD-10-CM | POA: Insufficient documentation

## 2015-05-07 DIAGNOSIS — S139XXS Sprain of joints and ligaments of unspecified parts of neck, sequela: Secondary | ICD-10-CM

## 2015-05-07 HISTORY — DX: Migraine without aura, not intractable, without status migrainosus: G43.009

## 2015-05-07 MED ORDER — METHOCARBAMOL 500 MG PO TABS: 500.0000 mg | ORAL_TABLET | Freq: Three times a day (TID) | ORAL | Status: DC

## 2015-05-07 MED ORDER — PREDNISONE 5 MG PO TABS: ORAL_TABLET | ORAL | Status: DC

## 2015-05-07 NOTE — Progress Notes (Signed)
Reason for visit: Low back pain  Debbie [name] is an 69 y.o. female  History of present illness:  Debbie [name] is a 69 year old right-handed white female with a history of obesity, and fibromyalgia. The patient has had total body pain, she has undergone physical therapy for her back and neck, this seems to help the neck in particular. She believes that she has gained strength in her arms and legs as well. The patient has had a chronic history of low back pain, but the pain has significantly worsened over the last several days, the patient has been in bed for the last 4 days. The pain has gotten worse over the last 6 months. The patient has pain in the mid back, going in the hips, and when she lies down, the pain will go down the legs. She is on gabapentin which has significantly helped her headache issues, but she is concerned about going up on the dose because of weight gain problems. The patient has a history of atrial fibrillation, and she has had onset of atrial fibrillation with the increased pain. She is on Xarelto currently. The last MRI of the low back was done 2 years ago. This did not show any surgically amenable lesions at that time. The patient returns this office for an evaluation.  Past Medical History  Diagnosis Date  . Hypertension   . Atrial fibrillation   . Depression with anxiety   . Fibromyalgia   . Breast cancer   . Muscle tension headache   . History of partial seizures   . Benign essential tremor   . Obesity   . Drowsiness     Excessive daytime  . Obstructive sleep apnea   . Vitamin B12 deficiency   . Tremors of nervous system   . Dyslipidemia   . Chronic renal insufficiency   . Seizures     Partial  . Memory disturbance   . Sprain of neck 06/26/2013  . Common migraine 05/07/2015    Past Surgical History  Procedure Laterality Date  . Brain meningioma excision Right   . Mastectomies Bilateral   . Abdominal hysterectomy    . Breast reconstructive    .  Breast reconstruction Bilateral   . Lumbosacral    . Lumbar spine surgery N/A   . Tubal ligation N/A   . Gallbladder resection    . Cholecystectomy    . Basal cell carcinoma excision    . Basal cell carcinoma excision    . Nose surgery      Basal cell carcinoma resection from the nose  . Dilution    . Dilation and curettage of uterus    . Ablation procedure      Cardiac, for Afib    Family History  Problem Relation Age of Onset  . Heart failure Mother   . Diabetes Mother   . Cancer - Colon Father   . Breast cancer Sister   . Stroke Maternal Grandmother   . Diabetes Maternal Grandfather   . Heart failure Maternal Grandfather     Social history:  reports that she has never smoked. She has never used smokeless tobacco. She reports that she does not drink alcohol or use illicit drugs.    Allergies  Allergen Reactions  . Adhesive [Tape] Other (See Comments)    Peels skin off (bandaids, too)  . Codeine Nausea And Vomiting  . Fish Oil Nausea And Vomiting  . Morphine And Related Nausea And Vomiting    Medications:  Prior to Admission medications   Medication Sig Start Date End Date Taking? Authorizing Provider  acetaminophen (TYLENOL) 500 MG tablet Take 1,000 mg by mouth every 6 (six) hours as needed.   Yes Historical Provider, MD  amiodarone (PACERONE) 200 MG tablet Take 200 mg by mouth 2 (two) times daily with a meal. Twice a day for 2 weeks then one a day. 10/14/14  Yes Historical Provider, MD  aspirin 325 MG tablet Take 325 mg by mouth daily.   Yes Historical Provider, MD  calcium carbonate (OS-CAL) 600 MG TABS tablet Take 600 mg by mouth daily with breakfast.   Yes Historical Provider, MD  cyanocobalamin 1000 MCG tablet Take 100 mcg by mouth daily.   Yes Historical Provider, MD  diltiazem (DILACOR XR) 120 MG 24 hr capsule Take 120 mg by mouth. Take every 12 hours as needed 10/14/14  Yes Historical Provider, MD  gabapentin (NEURONTIN) 300 MG capsule TAKE ONE CAPSULE BY MOUTH  IN THE MORNING AND TAKE TWO CAPSULES IN THE EVENING 03/25/15  Yes Kathrynn Ducking, MD  lamoTRIgine (LAMICTAL) 25 MG tablet TAKE ONE TABLET BY MOUTH TWICE DAILY 03/02/15  Yes Kathrynn Ducking, MD  promethazine (PHENERGAN) 25 MG tablet Take 1 tablet (25 mg total) by mouth every 6 (six) hours as needed for nausea or vomiting. 11/12/14  Yes Kathrynn Ducking, MD  ranolazine (RANEXA) 500 MG 12 hr tablet Take 500 mg by mouth 2 (two) times daily.   Yes Historical Provider, MD  Rivaroxaban (XARELTO) 15 MG TABS tablet Take 15 mg by mouth daily with supper.   Yes Historical Provider, MD  vitamin E 400 UNIT capsule Take 400 Units by mouth daily.   Yes Historical Provider, MD  methocarbamol (ROBAXIN) 500 MG tablet Take 1 tablet (500 mg total) by mouth 3 (three) times daily. 05/07/15   Kathrynn Ducking, MD  predniSONE (DELTASONE) 5 MG tablet Begin taking 6 tablets daily, taper by one tablet daily until off the medication. 05/07/15   Kathrynn Ducking, MD    ROS:  Out of a complete 14 system review of symptoms, the patient complains only of the following symptoms, and all other reviewed systems are negative.  Decreased activity, fatigue Eye itching, light sensitivity Shortness of breath Leg swelling Cold intolerance, heat intolerance, flushing Abdominal pain, constipation, nausea Insomnia, frequent waking, daytime sleepiness, snoring Frequency of urination, decreased urine output Joint pain, back pain, achy muscles, muscle cramps, walking difficulty, neck pain, neck stiffness Moles Bruising easily Dizziness, headache, weakness, tremors Depression  Blood pressure 113/54, pulse 63, height 5\' 10"  (1.778 m), weight 236 lb 3.2 oz (107.14 kg).  Physical Exam  General: The patient is alert and cooperative at the time of the examination. The patient is markedly obese.  Neuromuscular: The patient is able to flex the low back to about 90. The patient reports back pain with rotational movements of the hips  bilaterally.  Skin: No significant peripheral edema is noted.   Neurologic Exam  Mental status: The patient is alert and oriented x 3 at the time of the examination. The patient has apparent normal recent and remote memory, with an apparently normal attention span and concentration ability.   Cranial nerves: Facial symmetry is present. Speech is normal, no aphasia or dysarthria is noted. Extraocular movements are full. Visual fields are full.  Motor: The patient has good strength in all 4 extremities, but there is giveaway type weakness secondary to pain with the lower extremities.  Sensory examination: Soft  touch sensation is symmetric on the face, arms, and legs.  Coordination: The patient has good finger-nose-finger and heel-to-shin bilaterally.  Gait and station: The patient has a limping type gait on the right leg. The patient has some gait instability with tandem gait. Romberg is negative. No drift is seen.  Reflexes: Deep tendon reflexes are symmetric, but are depressed.   Assessment/Plan:  1. Common migraine  2. Fibromyalgia  3. Chronic low back pain  The patient is having increased low back pain, we will set her up for MRI evaluation of the back. The patient has had prior surgery at L5-S1 level. The patient has stage III chronic renal insufficiency, I will not give gadolinium enhancement. The patient will follow-up in 6 months. We will place her on a six-day prednisone Dosepak, 5 mg. The patient will be placed on Robaxin. If the MRI does not show surgically amenable issues, we will consider a referral to a pain center.  Jill Alexanders MD 05/07/2015 8:00 PM  [facility] [address] [place], [place] [address]  Phone [phone] Fax [phone]

## 2015-05-07 NOTE — Patient Instructions (Signed)
Back Pain, Adult Low back pain is very common. About 1 in 5 people have back pain.The cause of low back pain is rarely dangerous. The pain often gets better over time.About half of people with a sudden onset of back pain feel better in just 2 weeks. About 8 in 10 people feel better by 6 weeks.  CAUSES Some common causes of back pain include:  Strain of the muscles or ligaments supporting the spine.  Wear and tear (degeneration) of the spinal discs.  Arthritis.  Direct injury to the back. DIAGNOSIS Most of the time, the direct cause of low back pain is not known.However, back pain can be treated effectively even when the exact cause of the pain is unknown.Answering your caregiver's questions about your overall health and symptoms is one of the most accurate ways to make sure the cause of your pain is not dangerous. If your caregiver needs more information, he or she may order lab work or imaging tests (X-rays or MRIs).However, even if imaging tests show changes in your back, this usually does not require surgery. HOME CARE INSTRUCTIONS For many people, back pain returns.Since low back pain is rarely dangerous, it is often a condition that people can learn to manageon their own.   Remain active. It is stressful on the back to sit or stand in one place. Do not sit, drive, or stand in one place for more than 30 minutes at a time. Take short walks on level surfaces as soon as pain allows.Try to increase the length of time you walk each day.  Do not stay in bed.Resting more than 1 or 2 days can delay your recovery.  Do not avoid exercise or work.Your body is made to move.It is not dangerous to be active, even though your back may hurt.Your back will likely heal faster if you return to being active before your pain is gone.  Pay attention to your body when you bend and lift. Many people have less discomfortwhen lifting if they bend their knees, keep the load close to their bodies,and  avoid twisting. Often, the most comfortable positions are those that put less stress on your recovering back.  Find a comfortable position to sleep. Use a firm mattress and lie on your side with your knees slightly bent. If you lie on your back, put a pillow under your knees.  Only take over-the-counter or prescription medicines as directed by your caregiver. Over-the-counter medicines to reduce pain and inflammation are often the most helpful.Your caregiver may prescribe muscle relaxant drugs.These medicines help dull your pain so you can more quickly return to your normal activities and healthy exercise.  Put ice on the injured area.  Put ice in a plastic bag.  Place a towel between your skin and the bag.  Leave the ice on for 15-20 minutes, 03-04 times a day for the first 2 to 3 days. After that, ice and heat may be alternated to reduce pain and spasms.  Ask your caregiver about trying back exercises and gentle massage. This may be of some benefit.  Avoid feeling anxious or stressed.Stress increases muscle tension and can worsen back pain.It is important to recognize when you are anxious or stressed and learn ways to manage it.Exercise is a great option. SEEK MEDICAL CARE IF:  You have pain that is not relieved with rest or medicine.  You have pain that does not improve in 1 week.  You have new symptoms.  You are generally not feeling well. SEEK   IMMEDIATE MEDICAL CARE IF:   You have pain that radiates from your back into your legs.  You develop new bowel or bladder control problems.  You have unusual weakness or numbness in your arms or legs.  You develop nausea or vomiting.  You develop abdominal pain.  You feel faint. Document Released: 10/24/2005 Document Revised: 04/24/2012 Document Reviewed: 02/25/2014 [employer] Patient Information 2015 [employer]. This information is not intended to replace advice given to you by your health care provider. Make sure you  discuss any questions you have with your health care provider.  

## 2015-05-12 DIAGNOSIS — Z79899 Other long term (current) drug therapy: Secondary | ICD-10-CM | POA: Insufficient documentation

## 2015-05-12 DIAGNOSIS — E782 Mixed hyperlipidemia: Secondary | ICD-10-CM | POA: Diagnosis not present

## 2015-05-12 DIAGNOSIS — R5383 Other fatigue: Secondary | ICD-10-CM | POA: Diagnosis not present

## 2015-05-12 DIAGNOSIS — Z7901 Long term (current) use of anticoagulants: Secondary | ICD-10-CM | POA: Insufficient documentation

## 2015-05-12 DIAGNOSIS — I10 Essential (primary) hypertension: Secondary | ICD-10-CM | POA: Insufficient documentation

## 2015-05-12 DIAGNOSIS — I480 Paroxysmal atrial fibrillation: Secondary | ICD-10-CM | POA: Diagnosis not present

## 2015-05-13 ENCOUNTER — Ambulatory Visit: Payer: Medicare Other | Admitting: Neurology

## 2015-05-19 ENCOUNTER — Telehealth: Payer: Self-pay | Admitting: Neurology

## 2015-05-19 MED ORDER — TIZANIDINE HCL 2 MG PO TABS: 2.0000 mg | ORAL_TABLET | Freq: Three times a day (TID) | ORAL | Status: DC

## 2015-05-19 NOTE — Telephone Encounter (Signed)
Patient called and requested to speak with the nurse regarding some questions she has about her medication Rx. methocarbamol (ROBAXIN) 500 MG tablet. Please call and advise.

## 2015-05-19 NOTE — Telephone Encounter (Signed)
I called the patient. She stated that her insurance will not cover the Robaxin. She stated she really could not tell that it helped much anyway. Her insurance will cover meloxicam, diflunisal, naproxen, ibuprofen, and tizanidine. She states she cannot take naproxen or ibuprofen. She would like to try one of the others. She also wanted to let Dr. Jannifer Franklin know that the Prednisone really helped her.

## 2015-05-19 NOTE — Telephone Encounter (Signed)
I called patient. The patient indicates that her insurance will not cover the Robaxin. The prednisone was very helpful, however. I will call in tizanidine for her. I will call her once the MRI of the lumbar spine is done.

## 2015-05-23 ENCOUNTER — Ambulatory Visit
Admission: RE | Admit: 2015-05-23 | Discharge: 2015-05-23 | Disposition: A | Discharge status: Home / Self Care | Payer: Medicare Other | Source: Ambulatory Visit | Attending: Neurology | Admitting: Neurology

## 2015-05-23 DIAGNOSIS — M545 Low back pain, unspecified: Secondary | ICD-10-CM

## 2015-05-23 DIAGNOSIS — S139XXS Sprain of joints and ligaments of unspecified parts of neck, sequela: Secondary | ICD-10-CM

## 2015-05-23 DIAGNOSIS — G43009 Migraine without aura, not intractable, without status migrainosus: Secondary | ICD-10-CM

## 2015-05-25 ENCOUNTER — Telehealth: Payer: Self-pay | Admitting: Neurology

## 2015-05-25 NOTE — Telephone Encounter (Signed)
  I called the patient. The MRI the back shows no change from prior study done 2 years ago. The patient continues to have severe pain, indicates that she is having a lot of difficulty even walking. The pain is present with sitting, standing, and lying down. Medications have not been beneficial, she just completed physical therapy. The patient has gotten an appointment with the [facility] and [facility] in [place], telephone number [phone]. She will be seen in early August 2016.  MRI lumbar 05/25/15:  IMPRESSION: This is an abnormal MRI of the lumbar spine showing multilevel degenerative changes as detailed above. When compared to an MRI dated 02/13/2013, there has been no significant interval change. There does not appear to be nerve root impingement at any of the lumbar levels.

## 2015-06-11 DIAGNOSIS — N183 Chronic kidney disease, stage 3 (moderate): Secondary | ICD-10-CM | POA: Diagnosis not present

## 2015-06-11 DIAGNOSIS — I129 Hypertensive chronic kidney disease with stage 1 through stage 4 chronic kidney disease, or unspecified chronic kidney disease: Secondary | ICD-10-CM | POA: Diagnosis not present

## 2015-06-11 DIAGNOSIS — I480 Paroxysmal atrial fibrillation: Secondary | ICD-10-CM | POA: Diagnosis not present

## 2015-06-12 DIAGNOSIS — M4726 Other spondylosis with radiculopathy, lumbar region: Secondary | ICD-10-CM | POA: Diagnosis not present

## 2015-06-12 DIAGNOSIS — M419 Scoliosis, unspecified: Secondary | ICD-10-CM | POA: Diagnosis not present

## 2015-06-12 DIAGNOSIS — M961 Postlaminectomy syndrome, not elsewhere classified: Secondary | ICD-10-CM | POA: Diagnosis not present

## 2015-07-16 DIAGNOSIS — M5126 Other intervertebral disc displacement, lumbar region: Secondary | ICD-10-CM | POA: Diagnosis not present

## 2015-07-16 DIAGNOSIS — M4316 Spondylolisthesis, lumbar region: Secondary | ICD-10-CM | POA: Diagnosis not present

## 2015-07-16 DIAGNOSIS — M545 Low back pain: Secondary | ICD-10-CM | POA: Diagnosis not present

## 2015-07-16 DIAGNOSIS — M47816 Spondylosis without myelopathy or radiculopathy, lumbar region: Secondary | ICD-10-CM | POA: Diagnosis not present

## 2015-07-29 DIAGNOSIS — N183 Chronic kidney disease, stage 3 unspecified: Secondary | ICD-10-CM | POA: Insufficient documentation

## 2015-07-29 DIAGNOSIS — M7711 Lateral epicondylitis, right elbow: Secondary | ICD-10-CM | POA: Diagnosis not present

## 2015-07-29 DIAGNOSIS — M7552 Bursitis of left shoulder: Secondary | ICD-10-CM | POA: Diagnosis not present

## 2015-07-29 DIAGNOSIS — M25521 Pain in right elbow: Secondary | ICD-10-CM | POA: Diagnosis not present

## 2015-07-29 DIAGNOSIS — M25512 Pain in left shoulder: Secondary | ICD-10-CM | POA: Diagnosis not present

## 2015-08-04 DIAGNOSIS — M47816 Spondylosis without myelopathy or radiculopathy, lumbar region: Secondary | ICD-10-CM | POA: Diagnosis not present

## 2015-08-04 DIAGNOSIS — G894 Chronic pain syndrome: Secondary | ICD-10-CM | POA: Diagnosis not present

## 2015-08-04 DIAGNOSIS — Z79891 Long term (current) use of opiate analgesic: Secondary | ICD-10-CM | POA: Diagnosis not present

## 2015-08-04 DIAGNOSIS — Z79899 Other long term (current) drug therapy: Secondary | ICD-10-CM | POA: Diagnosis not present

## 2015-08-15 ENCOUNTER — Other Ambulatory Visit: Payer: Self-pay | Admitting: Neurology

## 2015-08-18 DIAGNOSIS — M47816 Spondylosis without myelopathy or radiculopathy, lumbar region: Secondary | ICD-10-CM | POA: Diagnosis not present

## 2015-09-09 DIAGNOSIS — M47816 Spondylosis without myelopathy or radiculopathy, lumbar region: Secondary | ICD-10-CM | POA: Diagnosis not present

## 2015-09-28 ENCOUNTER — Other Ambulatory Visit: Payer: Self-pay | Admitting: Neurology

## 2015-10-05 DIAGNOSIS — N183 Chronic kidney disease, stage 3 (moderate): Secondary | ICD-10-CM | POA: Diagnosis not present

## 2015-10-08 DIAGNOSIS — M47816 Spondylosis without myelopathy or radiculopathy, lumbar region: Secondary | ICD-10-CM | POA: Diagnosis not present

## 2015-10-08 DIAGNOSIS — M791 Myalgia: Secondary | ICD-10-CM | POA: Diagnosis not present

## 2015-10-08 DIAGNOSIS — M4316 Spondylolisthesis, lumbar region: Secondary | ICD-10-CM | POA: Diagnosis not present

## 2015-10-13 DIAGNOSIS — N183 Chronic kidney disease, stage 3 (moderate): Secondary | ICD-10-CM | POA: Diagnosis not present

## 2015-10-13 DIAGNOSIS — E559 Vitamin D deficiency, unspecified: Secondary | ICD-10-CM | POA: Diagnosis not present

## 2015-10-13 DIAGNOSIS — I129 Hypertensive chronic kidney disease with stage 1 through stage 4 chronic kidney disease, or unspecified chronic kidney disease: Secondary | ICD-10-CM | POA: Diagnosis not present

## 2015-10-28 DIAGNOSIS — Z7901 Long term (current) use of anticoagulants: Secondary | ICD-10-CM | POA: Diagnosis not present

## 2015-10-28 DIAGNOSIS — E782 Mixed hyperlipidemia: Secondary | ICD-10-CM | POA: Insufficient documentation

## 2015-10-28 DIAGNOSIS — F411 Generalized anxiety disorder: Secondary | ICD-10-CM | POA: Diagnosis not present

## 2015-10-28 DIAGNOSIS — M797 Fibromyalgia: Secondary | ICD-10-CM | POA: Diagnosis not present

## 2015-10-28 DIAGNOSIS — N183 Chronic kidney disease, stage 3 (moderate): Secondary | ICD-10-CM | POA: Diagnosis not present

## 2015-10-28 DIAGNOSIS — Z23 Encounter for immunization: Secondary | ICD-10-CM | POA: Diagnosis not present

## 2015-10-28 DIAGNOSIS — E559 Vitamin D deficiency, unspecified: Secondary | ICD-10-CM | POA: Diagnosis not present

## 2015-10-28 DIAGNOSIS — F3341 Major depressive disorder, recurrent, in partial remission: Secondary | ICD-10-CM | POA: Diagnosis not present

## 2015-10-28 DIAGNOSIS — I129 Hypertensive chronic kidney disease with stage 1 through stage 4 chronic kidney disease, or unspecified chronic kidney disease: Secondary | ICD-10-CM | POA: Insufficient documentation

## 2015-10-28 DIAGNOSIS — I480 Paroxysmal atrial fibrillation: Secondary | ICD-10-CM | POA: Diagnosis not present

## 2015-10-28 DIAGNOSIS — N182 Chronic kidney disease, stage 2 (mild): Secondary | ICD-10-CM | POA: Insufficient documentation

## 2015-11-13 ENCOUNTER — Ambulatory Visit (INDEPENDENT_AMBULATORY_CARE_PROVIDER_SITE_OTHER): Payer: Medicare Other | Admitting: Neurology

## 2015-11-13 ENCOUNTER — Encounter: Payer: Self-pay | Admitting: Neurology

## 2015-11-13 VITALS — BP 139/67 | HR 70 | Ht 70.0 in | Wt 235.5 lb

## 2015-11-13 DIAGNOSIS — M797 Fibromyalgia: Secondary | ICD-10-CM | POA: Diagnosis not present

## 2015-11-13 DIAGNOSIS — G8929 Other chronic pain: Secondary | ICD-10-CM

## 2015-11-13 DIAGNOSIS — H9209 Otalgia, unspecified ear: Secondary | ICD-10-CM | POA: Insufficient documentation

## 2015-11-13 DIAGNOSIS — S139XXS Sprain of joints and ligaments of unspecified parts of neck, sequela: Secondary | ICD-10-CM

## 2015-11-13 DIAGNOSIS — M545 Low back pain: Secondary | ICD-10-CM | POA: Diagnosis not present

## 2015-11-13 MED ORDER — GABAPENTIN 300 MG PO CAPS: ORAL_CAPSULE | ORAL | Status: DC

## 2015-11-13 MED ORDER — LAMOTRIGINE 25 MG PO TABS
25.0000 mg | ORAL_TABLET | Freq: Two times a day (BID) | ORAL | Status: DC
Start: 2015-11-13 — End: 2016-05-21

## 2015-11-13 NOTE — Progress Notes (Addendum)
Reason for visit: Back pain  Debbie [name] is an 70 y.o. female  History of present illness:  Debbie [name] is a 70 year old right-handed white female with a history of fibromyalgia, and chronic low back pain. The patient has had prior lumbar spine surgery. The patient has been seen by the [facility] in [place], she has gotten radiofrequency ablation therapy on her back which has been remarkably effective. She also had an injection in the left hip for hip pain, this has also helped. The patient feels much better, she has been able to become much more mobile. She indicates that physical therapy helped her neck and shoulder discomfort, overall she is feeling much better. The patient returns to the office today for an evaluation. She remains on gabapentin, hydrocodone, tizanidine, and lamotrigine. The patient is also being treated for tennis elbow on the right.  Past Medical History  Diagnosis Date  . Hypertension   . Atrial fibrillation ([place])   . Depression with anxiety   . Fibromyalgia   . Breast cancer ([place])   . Muscle tension headache   . History of partial seizures   . Benign essential tremor   . Obesity   . Drowsiness     Excessive daytime  . Obstructive sleep apnea   . Vitamin B12 deficiency   . Tremors of nervous system   . Dyslipidemia   . Chronic renal insufficiency   . Seizures ([facility])     Partial  . Memory disturbance   . Sprain of neck 06/26/2013  . Common migraine 05/07/2015    Past Surgical History  Procedure Laterality Date  . Brain meningioma excision Right   . Mastectomies Bilateral   . Abdominal hysterectomy    . Breast reconstructive    . Breast reconstruction Bilateral   . Lumbosacral    . Lumbar spine surgery N/A   . Tubal ligation N/A   . Gallbladder resection    . Cholecystectomy    . Basal cell carcinoma excision    . Basal cell carcinoma excision    . Nose surgery      Basal cell carcinoma resection from the nose  . Dilution     . Dilation and curettage of uterus    . Ablation procedure      Cardiac, for Afib    Family History  Problem Relation Age of Onset  . Heart failure Mother   . Diabetes Mother   . Cancer - Colon Father   . Breast cancer Sister   . Stroke Maternal Grandmother   . Diabetes Maternal Grandfather   . Heart failure Maternal Grandfather     Social history:  reports that she has never smoked. She has never used smokeless tobacco. She reports that she does not drink alcohol or use illicit drugs.    Allergies  Allergen Reactions  . Adhesive [Tape] Other (See Comments)    Peels skin off (bandaids, too)  . Atorvastatin Other (See Comments)  . Tramadol Other (See Comments) and Nausea And Vomiting  . Buprenorphine Hcl Nausea And Vomiting  . Codeine Nausea And Vomiting  . Fish Oil Nausea And Vomiting  . Morphine And Related Nausea And Vomiting    Medications:  Prior to Admission medications   Medication Sig Start Date End Date Taking? Authorizing Provider  acetaminophen (TYLENOL) 500 MG tablet Take 1,000 mg by mouth every 6 (six) hours as needed.   Yes Historical Provider, MD  amiodarone (PACERONE) 200 MG tablet Take 200 mg  by mouth 2 (two) times daily with a meal. Twice a day for 2 weeks then one a day. 10/14/14  Yes Historical Provider, MD  aspirin 325 MG tablet Take 325 mg by mouth daily.   Yes Historical Provider, MD  calcium carbonate (OS-CAL) 600 MG TABS tablet Take 600 mg by mouth daily with breakfast.   Yes Historical Provider, MD  Cholecalciferol (VITAMIN D3) 1000 units CAPS Take 1 capsule by mouth daily.   Yes Historical Provider, MD  cyanocobalamin 1000 MCG tablet Take 100 mcg by mouth daily.   Yes Historical Provider, MD  diltiazem (DILACOR XR) 120 MG 24 hr capsule Take 120 mg by mouth. Take every 12 hours as needed 10/14/14  Yes Historical Provider, MD  gabapentin (NEURONTIN) 300 MG capsule TAKE ONE CAPSULE BY MOUTH IN THE MORNING AND TWO IN THE EVENING 08/16/15  Yes Kathrynn Ducking, MD  lamoTRIgine (LAMICTAL) 25 MG tablet TAKE ONE TABLET BY MOUTH TWICE DAILY 09/28/15  Yes Kathrynn Ducking, MD  promethazine (PHENERGAN) 25 MG tablet Take 1 tablet (25 mg total) by mouth every 6 (six) hours as needed for nausea or vomiting. 11/12/14  Yes Kathrynn Ducking, MD  ranolazine (RANEXA) 500 MG 12 hr tablet Take 500 mg by mouth 2 (two) times daily.   Yes Historical Provider, MD  Rivaroxaban (XARELTO) 15 MG TABS tablet Take 15 mg by mouth daily with supper.   Yes Historical Provider, MD  tiZANidine (ZANAFLEX) 2 MG tablet Take 1 tablet (2 mg total) by mouth 3 (three) times daily. 05/19/15  Yes Kathrynn Ducking, MD  traMADol (ULTRAM) 50 MG tablet Take 50 mg by mouth every 6 (six) hours as needed.   Yes Historical Provider, MD    ROS:  Out of a complete 14 system review of symptoms, the patient complains only of the following symptoms, and all other reviewed systems are negative.  Fatigue Runny nose Light sensitivity Shortness of breath Cold intolerance, heat intolerance Constipation, nausea Frequency of urination or urinary urgency Bruising easily  Blood pressure 139/67, pulse 70, height 5\' 10"  (1.778 m), weight 235 lb 8 oz (106.822 kg).  Physical Exam  General: The patient is alert and cooperative at the time of the examination. The patient is moderately obese.  Skin: No significant peripheral edema is noted.   Neurologic Exam  Mental status: The patient is alert and oriented x 3 at the time of the examination. The patient has apparent normal recent and remote memory, with an apparently normal attention span and concentration ability.   Cranial nerves: Facial symmetry is present. Speech is normal, no aphasia or dysarthria is noted. Extraocular movements are full. Visual fields are full.  Motor: The patient has good strength in all 4 extremities.  Sensory examination: Soft touch sensation is symmetric on the face, arms, and legs.  Coordination: The patient has  good finger-nose-finger and heel-to-shin bilaterally.  Gait and station: The patient has a normal gait. Tandem gait is unsteady. Romberg is negative. No drift is seen.  Reflexes: Deep tendon reflexes are symmetric, but are depressed.   Assessment/Plan:  1. Fibromyalgia  2. Chronic low back pain  The patient has had good improvement with therapeutic on her low back pain issues. The patient will be continued on her medication, a prescription for the gabapentin and Lamictal were called in. She will follow-up in 6 months, sooner if needed. Overall, the patient feels much better, she has been able to increase her level of mobility.  Bonner Puna  Jannifer Franklin MD 11/13/2015 3:46 PM  [facility] [address] [place], [place] [address]  Phone [phone] Fax [phone]

## 2015-11-13 NOTE — Patient Instructions (Signed)

## 2015-12-10 DIAGNOSIS — M545 Low back pain: Secondary | ICD-10-CM | POA: Diagnosis not present

## 2015-12-10 DIAGNOSIS — M419 Scoliosis, unspecified: Secondary | ICD-10-CM | POA: Diagnosis not present

## 2015-12-10 DIAGNOSIS — M5126 Other intervertebral disc displacement, lumbar region: Secondary | ICD-10-CM | POA: Diagnosis not present

## 2015-12-10 DIAGNOSIS — M47816 Spondylosis without myelopathy or radiculopathy, lumbar region: Secondary | ICD-10-CM | POA: Diagnosis not present

## 2015-12-16 DIAGNOSIS — I10 Essential (primary) hypertension: Secondary | ICD-10-CM | POA: Diagnosis not present

## 2015-12-16 DIAGNOSIS — E782 Mixed hyperlipidemia: Secondary | ICD-10-CM | POA: Diagnosis not present

## 2015-12-16 DIAGNOSIS — Z79899 Other long term (current) drug therapy: Secondary | ICD-10-CM | POA: Diagnosis not present

## 2015-12-16 DIAGNOSIS — I480 Paroxysmal atrial fibrillation: Secondary | ICD-10-CM | POA: Diagnosis not present

## 2015-12-22 DIAGNOSIS — R079 Chest pain, unspecified: Secondary | ICD-10-CM | POA: Diagnosis not present

## 2015-12-22 DIAGNOSIS — M5126 Other intervertebral disc displacement, lumbar region: Secondary | ICD-10-CM | POA: Diagnosis not present

## 2015-12-22 DIAGNOSIS — R001 Bradycardia, unspecified: Secondary | ICD-10-CM | POA: Diagnosis not present

## 2015-12-23 ENCOUNTER — Encounter (HOSPITAL_COMMUNITY): Payer: Self-pay

## 2015-12-23 ENCOUNTER — Emergency Department (HOSPITAL_COMMUNITY): Payer: Medicare Other

## 2015-12-23 ENCOUNTER — Emergency Department (HOSPITAL_COMMUNITY)
Admission: EM | Admit: 2015-12-23 | Discharge: 2015-12-23 | Disposition: A | Discharge status: Home / Self Care | Payer: Medicare Other | Attending: Emergency Medicine | Admitting: Emergency Medicine

## 2015-12-23 DIAGNOSIS — R42 Dizziness and giddiness: Secondary | ICD-10-CM | POA: Diagnosis not present

## 2015-12-23 DIAGNOSIS — Z90710 Acquired absence of both cervix and uterus: Secondary | ICD-10-CM | POA: Insufficient documentation

## 2015-12-23 DIAGNOSIS — R1030 Lower abdominal pain, unspecified: Secondary | ICD-10-CM | POA: Insufficient documentation

## 2015-12-23 DIAGNOSIS — Z79899 Other long term (current) drug therapy: Secondary | ICD-10-CM | POA: Diagnosis not present

## 2015-12-23 DIAGNOSIS — G8929 Other chronic pain: Secondary | ICD-10-CM | POA: Insufficient documentation

## 2015-12-23 DIAGNOSIS — I129 Hypertensive chronic kidney disease with stage 1 through stage 4 chronic kidney disease, or unspecified chronic kidney disease: Secondary | ICD-10-CM | POA: Insufficient documentation

## 2015-12-23 DIAGNOSIS — Z87828 Personal history of other (healed) physical injury and trauma: Secondary | ICD-10-CM | POA: Insufficient documentation

## 2015-12-23 DIAGNOSIS — F418 Other specified anxiety disorders: Secondary | ICD-10-CM | POA: Diagnosis not present

## 2015-12-23 DIAGNOSIS — R079 Chest pain, unspecified: Secondary | ICD-10-CM | POA: Insufficient documentation

## 2015-12-23 DIAGNOSIS — R300 Dysuria: Secondary | ICD-10-CM | POA: Insufficient documentation

## 2015-12-23 DIAGNOSIS — R350 Frequency of micturition: Secondary | ICD-10-CM | POA: Diagnosis not present

## 2015-12-23 DIAGNOSIS — Z7901 Long term (current) use of anticoagulants: Secondary | ICD-10-CM | POA: Diagnosis not present

## 2015-12-23 DIAGNOSIS — R109 Unspecified abdominal pain: Secondary | ICD-10-CM | POA: Diagnosis present

## 2015-12-23 DIAGNOSIS — R001 Bradycardia, unspecified: Secondary | ICD-10-CM | POA: Diagnosis not present

## 2015-12-23 DIAGNOSIS — N183 Chronic kidney disease, stage 3 (moderate): Secondary | ICD-10-CM | POA: Diagnosis not present

## 2015-12-23 DIAGNOSIS — Z7982 Long term (current) use of aspirin: Secondary | ICD-10-CM | POA: Insufficient documentation

## 2015-12-23 DIAGNOSIS — R3915 Urgency of urination: Secondary | ICD-10-CM | POA: Diagnosis not present

## 2015-12-23 DIAGNOSIS — Z853 Personal history of malignant neoplasm of breast: Secondary | ICD-10-CM | POA: Diagnosis not present

## 2015-12-23 DIAGNOSIS — G40909 Epilepsy, unspecified, not intractable, without status epilepticus: Secondary | ICD-10-CM | POA: Diagnosis not present

## 2015-12-23 DIAGNOSIS — R110 Nausea: Secondary | ICD-10-CM | POA: Diagnosis not present

## 2015-12-23 DIAGNOSIS — G43909 Migraine, unspecified, not intractable, without status migrainosus: Secondary | ICD-10-CM | POA: Diagnosis not present

## 2015-12-23 DIAGNOSIS — E669 Obesity, unspecified: Secondary | ICD-10-CM | POA: Insufficient documentation

## 2015-12-23 LAB — CBC WITH DIFFERENTIAL/PLATELET
Basophils Absolute: 0 10*3/uL (ref 0.0–0.1)
Basophils Relative: 0 %
Eosinophils Absolute: 0 10*3/uL (ref 0.0–0.7)
Eosinophils Relative: 0 %
HCT: 39.6 % (ref 36.0–46.0)
Hemoglobin: 12.5 g/dL (ref 12.0–15.0)
Lymphocytes Relative: 13 %
Lymphs Abs: 0.6 10*3/uL — ABNORMAL LOW (ref 0.7–4.0)
MCH: 30.3 pg (ref 26.0–34.0)
MCHC: 31.6 g/dL (ref 30.0–36.0)
MCV: 96.1 fL (ref 78.0–100.0)
Monocytes Absolute: 0 10*3/uL — ABNORMAL LOW (ref 0.1–1.0)
Monocytes Relative: 1 %
Neutro Abs: 4.1 10*3/uL (ref 1.7–7.7)
Neutrophils Relative %: 86 %
Platelets: 146 10*3/uL — ABNORMAL LOW (ref 150–400)
RBC: 4.12 MIL/uL (ref 3.87–5.11)
RDW: 13.7 % (ref 11.5–15.5)
WBC: 4.8 10*3/uL (ref 4.0–10.5)

## 2015-12-23 LAB — LIPASE, BLOOD: Lipase: 22 U/L (ref 11–51)

## 2015-12-23 LAB — COMPREHENSIVE METABOLIC PANEL
ALT: 14 U/L (ref 14–54)
AST: 18 U/L (ref 15–41)
Albumin: 3.5 g/dL (ref 3.5–5.0)
Alkaline Phosphatase: 40 U/L (ref 38–126)
Anion gap: 12 (ref 5–15)
BUN: 10 mg/dL (ref 6–20)
CO2: 23 mmol/L (ref 22–32)
Calcium: 9.3 mg/dL (ref 8.9–10.3)
Chloride: 107 mmol/L (ref 101–111)
Creatinine, Ser: 0.9 mg/dL (ref 0.44–1.00)
GFR calc Af Amer: >60 mL/min (ref >60)
GFR calc non Af Amer: >60 mL/min (ref >60)
Glucose, Bld: 152 mg/dL — ABNORMAL HIGH (ref 65–99)
Potassium: 4.3 mmol/L (ref 3.5–5.1)
Sodium: 142 mmol/L (ref 135–145)
Total Bilirubin: 0.6 mg/dL (ref 0.3–1.2)
Total Protein: 6.3 g/dL — ABNORMAL LOW (ref 6.5–8.1)

## 2015-12-23 LAB — URINALYSIS, ROUTINE W REFLEX MICROSCOPIC
Bilirubin Urine: NEGATIVE
Glucose, UA: NEGATIVE mg/dL
Hgb urine dipstick: NEGATIVE
Ketones, ur: NEGATIVE mg/dL
Leukocytes, UA: NEGATIVE
Nitrite: NEGATIVE
Protein, ur: NEGATIVE mg/dL
Specific Gravity, Urine: 1.011 (ref 1.005–1.030)
pH: 7.5 (ref 5.0–8.0)

## 2015-12-23 LAB — TROPONIN I: Troponin I: <0.03 ng/mL (ref <0.031)

## 2015-12-23 MED ORDER — FENTANYL CITRATE (PF) 100 MCG/2ML IJ SOLN
50.0000 ug | Freq: Once | INTRAMUSCULAR | Status: AC
  Administered 2015-12-23: 50 ug via INTRAVENOUS
  Filled 2015-12-23: qty 2

## 2015-12-23 NOTE — ED Notes (Signed)
Dr Ward back at the bedside.

## 2015-12-23 NOTE — ED Provider Notes (Addendum)
By signing my name below, I, Soijett Blue, attest that this documentation has been prepared under the direction and in the presence of [employer], DO. Electronically Signed: Soijett Blue, ED Scribe. 12/23/2015. 1:14 AM.  TIME SEEN: 12:43 AM   CHIEF COMPLAINT: Bradycardia; Flank Pain  HPI:  Debbie [name] is a 70 y.o. female with a medical hx of A-fib on Xarelto, stage 3 kidney disease, HTN, breast CA, fibromyalgia, chronic back pain who presents to the Emergency Department via EMS complaining of constant, moderate, right sided flank pain that radiates below her right breast onset 6 PM yesterday. Described as sharp, crampy and severe. She reports that she was using the restroom when her symptoms began and she was unable to get off the toilet by herself due to the pain. Pt had 2 episodes where she was unable to get off the toilet by herself due to pain. Pt was taken off amiodarone last week by Dr. Elonda Husky.  Pt notes that she had a transforaminal epidural injection to S1by Dr. Maia Petties  on 12/22/2015. States that her back pain is normally lower than the pain she is having today. She also reports that her heart rate is typically low in the 50s.   She states that she is having associated symptoms of nausea, chills, urgency, frequency x yesterday, dysuria x yesterday, abdominal pain, and lightheadedness. Pt notes that she has been urinating more frequently since being in the ED tonight. She states that she has not tried any medications for the relief for her symptoms. She denies fever, v/d, cough, SOB, bowel/bladder incontinence, hematuria, numbness, tingling, weakness, and any other symptoms. Pt denies a PMHx of kidney stones. Pt notes that she is allergic to tramadol. Denies PMHx of blood clots in legs or lungs. Status post cholecystectomy, appendectomy and hysterectomy.   ROS: See HPI Constitutional: no fever  Eyes: no drainage  ENT: no runny nose   Cardiovascular:  Right lateral chest pain  Resp: no  SOB  GI: no vomiting GU:  dysuria Integumentary: no rash  Allergy: no hives  Musculoskeletal: no leg swelling  Neurological: no slurred speech ROS otherwise negative  PAST MEDICAL HISTORY/PAST SURGICAL HISTORY:  Past Medical History  Diagnosis Date  . Hypertension   . Atrial fibrillation ([place])   . Depression with anxiety   . Fibromyalgia   . Breast cancer ([place])   . Muscle tension headache   . History of partial seizures   . Benign essential tremor   . Obesity   . Drowsiness     Excessive daytime  . Obstructive sleep apnea   . Vitamin B12 deficiency   . Tremors of nervous system   . Dyslipidemia   . Chronic renal insufficiency   . Seizures ([facility])     Partial  . Memory disturbance   . Sprain of neck 06/26/2013  . Common migraine 05/07/2015    MEDICATIONS:  Prior to Admission medications   Medication Sig Start Date End Date Taking? Authorizing Provider  acetaminophen (TYLENOL) 500 MG tablet Take 1,000 mg by mouth every 6 (six) hours as needed.    Historical Provider, MD  amiodarone (PACERONE) 200 MG tablet Take 200 mg by mouth 2 (two) times daily with a meal. Twice a day for 2 weeks then one a day. 10/14/14   Historical Provider, MD  aspirin 325 MG tablet Take 325 mg by mouth daily.    Historical Provider, MD  calcium carbonate (OS-CAL) 600 MG TABS tablet Take 600 mg by mouth daily with  breakfast.    Historical Provider, MD  Cholecalciferol (VITAMIN D3) 1000 units CAPS Take 1 capsule by mouth daily.    Historical Provider, MD  cyanocobalamin 1000 MCG tablet Take 100 mcg by mouth daily.    Historical Provider, MD  diltiazem (DILACOR XR) 120 MG 24 hr capsule Take 120 mg by mouth. Take every 12 hours as needed 10/14/14   Historical Provider, MD  gabapentin (NEURONTIN) 300 MG capsule TAKE ONE CAPSULE BY MOUTH IN THE MORNING AND TWO IN THE EVENING 11/13/15   Kathrynn Ducking, MD  HYDROcodone-acetaminophen (NORCO/VICODIN) 5-325 MG tablet Take 1 tablet by mouth every 6 (six) hours as  needed for moderate pain.    Historical Provider, MD  lamoTRIgine (LAMICTAL) 25 MG tablet Take 1 tablet (25 mg total) by mouth 2 (two) times daily. 11/13/15   Kathrynn Ducking, MD  promethazine (PHENERGAN) 25 MG tablet Take 1 tablet (25 mg total) by mouth every 6 (six) hours as needed for nausea or vomiting. 11/12/14   Kathrynn Ducking, MD  ranolazine (RANEXA) 500 MG 12 hr tablet Take 500 mg by mouth 2 (two) times daily.    Historical Provider, MD  Rivaroxaban (XARELTO) 15 MG TABS tablet Take 15 mg by mouth daily with supper.    Historical Provider, MD  tiZANidine (ZANAFLEX) 2 MG tablet Take 1 tablet (2 mg total) by mouth 3 (three) times daily. 05/19/15   Kathrynn Ducking, MD    ALLERGIES:  Allergies  Allergen Reactions  . Adhesive [Tape] Other (See Comments)    Peels skin off (bandaids, too)  . Atorvastatin Other (See Comments)  . Tramadol Other (See Comments) and Nausea And Vomiting  . Buprenorphine Hcl Nausea And Vomiting  . Codeine Nausea And Vomiting  . Fish Oil Nausea And Vomiting  . Morphine And Related Nausea And Vomiting    SOCIAL HISTORY:  Social History  Substance Use Topics  . Smoking status: Never Smoker   . Smokeless tobacco: Never Used  . Alcohol Use: No    FAMILY HISTORY: Family History  Problem Relation Age of Onset  . Heart failure Mother   . Diabetes Mother   . Cancer - Colon Father   . Breast cancer Sister   . Stroke Maternal Grandmother   . Diabetes Maternal Grandfather   . Heart failure Maternal Grandfather     EXAM: BP 142/62 mmHg  Pulse 42  Temp(Src) 97.3 F (36.3 C)  Resp 15  SpO2 96% CONSTITUTIONAL: Alert and oriented and responds appropriately to questions. Chronically ill-appearing, elderly, obese, appears uncomfortable HEAD: Normocephalic EYES: Conjunctivae clear, PERRL ENT: normal nose; no rhinorrhea; moist mucous membranes; pharynx without lesions noted NECK: Supple, no meningismus, no LAD  CARD: [place] bradycardic; S1 and S2  appreciated; no murmurs, no clicks, no rubs, no gallops RESP: Normal chest excursion without splinting or tachypnea; breath sounds clear and equal bilaterally; no wheezes, no rhonchi, no rales, no hypoxia or respiratory distress, speaking full sentences CHEST:  Tender to palpation over the right lateral chest wall without crepitus, ecchymosis or deformity ABD/GI: Normal bowel sounds; non-distended; soft, tender throughout the right mid and lower abdomen with voluntary guarding, no rebound or peritoneal signs BACK:  The back appears normal and is tender to palpation over the right flank but no midline spinal tenderness, step-off or deformity. There is a Band-Aid over the lower lumbar area from recent injection with no spreading erythema, warmth, fluctuance, induration or tenderness. EXT: Normal ROM in all joints; non-tender to palpation; no edema;  normal capillary refill; no cyanosis, no calf tenderness or swelling    SKIN: Normal color for age and race; warm NEURO: Moves all extremities equally, sensation to light touch intact diffusely, cranial nerves II through XII intact, no saddle anesthesia PSYCH: The patient's mood and manner are appropriate. Grooming and personal hygiene are appropriate.  MEDICAL DECISION MAKING: Pt here with complaints of right flank pain. It is reproducible with palpation but also rates into the abdomen. Differential diagnosis includes muscle strain, spasm, kidney stone, pyelonephritis, colitis. She is status post appendectomy, cholecystectomy and hysterectomy. No history of PE or DVT. She states that she feels like the pain will make her catch her breath but no shortness of breath. No other chest pain. EKG shows sinus bradycardia and nonspecific T-wave flattening with no old for comparison. We'll obtain labs, urine, CT of her abdomen and pelvis without contrast, chest x-ray. We'll give pain medication and reassess.  ED PROGRESS: Patient's labs unremarkable. Urine shows no sign  of infection or blood. Troponin negative.   2:30 AM  Pt's chest x-ray and CT are unremarkable. Pain is improved with massage by her daughter and worse with minimal movement. I suspect this is musculoskeletal in nature. Her pain is improved with IV fentanyl. She has Vicodin tablets at home that she can take for pain. I do not feel this time she needs further emergent workup. I feel she is safe to be discharged. Discussed return precautions. Patient, husband and daughter at bedside verbalize understanding and are comfortable this plan. She states she will follow-up with her primary care physician this week.   EKG Interpretation  Date/Time:  Wednesday December 23 2015 00:23:03 EST Ventricular Rate:  44 PR Interval:  197 QRS Duration: 99 QT Interval:  559 QTC Calculation: 478 R Axis:   10 Text Interpretation:  Sinus bradycardia Low voltage, precordial leads Nonspecific T abnormalities, lateral leads No old tracing to compare Confirmed by Kaydra Borgen,  DO, Grier Vu ([id number]) on 12/23/2015 12:39:56 AM       I personally performed the services described in this documentation, which was scribed in my presence. The recorded information has been reviewed and is accurate.    [place], DO 12/23/15 0241    Of note patient's heart rate is in the mid 40s to low 50s. It appears this is her baseline although back to 2014. She is not having lightheadedness. No left-sided chest discomfort or chest pressure. Right-sided chest pain is reproducible with palpation of her chest wall. No shortness of breath. Otherwise he would've dynamic stable. On oxygen chronically at home. She does not appear to be on a beta blocker.  [place], DO 12/23/15 [phone]

## 2015-12-23 NOTE — ED Notes (Signed)
Pt placed on bedpan

## 2015-12-23 NOTE — ED Notes (Addendum)
Patient transported to X-ray 

## 2015-12-23 NOTE — ED Notes (Signed)
Per [facility]: Pt is having right sided flank pain, radiating to right breast. Steroid injections to L5 area on 12/22/2015. Pt states she usually drinks caffeine to help raise her heart rate. EMS gave the pt 500 ml of saline. 12 lead unremarkable.

## 2015-12-23 NOTE — Discharge Instructions (Signed)
Please take your Vicodin as prescribed for pain. Your workup here today was normal. Please follow-up with your primary care physician.   Flank Pain Flank pain refers to pain that is located on the side of the body between the upper abdomen and the back. The pain may occur over a short period of time (acute) or may be long-term or reoccurring (chronic). It may be mild or severe. Flank pain can be caused by many things. CAUSES  Some of the more common causes of flank pain include:  Muscle strains.   Muscle spasms.   A disease of your spine (vertebral disk disease).   A lung infection (pneumonia).   Fluid around your lungs (pulmonary edema).   A kidney infection.   Kidney stones.   A very painful skin rash caused by the chickenpox virus (shingles).   Gallbladder disease.  [place] care will depend on the cause of your pain. In general,  Rest as directed by your caregiver.  Drink enough fluids to keep your urine clear or pale yellow.  Only take over-the-counter or prescription medicines as directed by your caregiver. Some medicines may help relieve the pain.  Tell your caregiver about any changes in your pain.  Follow up with your caregiver as directed. SEEK IMMEDIATE MEDICAL CARE IF:   Your pain is not controlled with medicine.   You have new or worsening symptoms.  Your pain increases.   You have abdominal pain.   You have shortness of breath.   You have persistent nausea or vomiting.   You have swelling in your abdomen.   You feel faint or pass out.   You have blood in your urine.  You have a fever or persistent symptoms for more than 2-3 days.  You have a fever and your symptoms suddenly get worse. MAKE SURE YOU:   Understand these instructions.  Will watch your condition.  Will get help right away if you are not doing well or get worse.   This information is not intended to replace advice given to you by your  health care provider. Make sure you discuss any questions you have with your health care provider.   Document Released: 12/15/2005 Document Revised: 07/18/2012 Document Reviewed: 06/07/2012 Elsevier Interactive Patient Education [employer].

## 2016-01-18 DIAGNOSIS — M7711 Lateral epicondylitis, right elbow: Secondary | ICD-10-CM | POA: Diagnosis not present

## 2016-01-18 DIAGNOSIS — M79672 Pain in left foot: Secondary | ICD-10-CM | POA: Diagnosis not present

## 2016-01-18 DIAGNOSIS — M25521 Pain in right elbow: Secondary | ICD-10-CM | POA: Diagnosis not present

## 2016-01-18 DIAGNOSIS — S9032XA Contusion of left foot, initial encounter: Secondary | ICD-10-CM | POA: Diagnosis not present

## 2016-01-18 DIAGNOSIS — M19071 Primary osteoarthritis, right ankle and foot: Secondary | ICD-10-CM | POA: Diagnosis not present

## 2016-01-19 DIAGNOSIS — M5126 Other intervertebral disc displacement, lumbar region: Secondary | ICD-10-CM | POA: Diagnosis not present

## 2016-01-19 DIAGNOSIS — M47816 Spondylosis without myelopathy or radiculopathy, lumbar region: Secondary | ICD-10-CM | POA: Diagnosis not present

## 2016-01-29 DIAGNOSIS — M47816 Spondylosis without myelopathy or radiculopathy, lumbar region: Secondary | ICD-10-CM | POA: Insufficient documentation

## 2016-01-29 DIAGNOSIS — M4316 Spondylolisthesis, lumbar region: Secondary | ICD-10-CM | POA: Insufficient documentation

## 2016-01-29 DIAGNOSIS — K219 Gastro-esophageal reflux disease without esophagitis: Secondary | ICD-10-CM | POA: Insufficient documentation

## 2016-01-29 DIAGNOSIS — M5126 Other intervertebral disc displacement, lumbar region: Secondary | ICD-10-CM | POA: Insufficient documentation

## 2016-01-29 DIAGNOSIS — R251 Tremor, unspecified: Secondary | ICD-10-CM | POA: Insufficient documentation

## 2016-01-29 DIAGNOSIS — F329 Major depressive disorder, single episode, unspecified: Secondary | ICD-10-CM | POA: Insufficient documentation

## 2016-01-29 DIAGNOSIS — I10 Essential (primary) hypertension: Secondary | ICD-10-CM | POA: Insufficient documentation

## 2016-01-29 DIAGNOSIS — M1990 Unspecified osteoarthritis, unspecified site: Secondary | ICD-10-CM | POA: Insufficient documentation

## 2016-01-29 DIAGNOSIS — M8580 Other specified disorders of bone density and structure, unspecified site: Secondary | ICD-10-CM | POA: Insufficient documentation

## 2016-01-29 DIAGNOSIS — F419 Anxiety disorder, unspecified: Secondary | ICD-10-CM | POA: Insufficient documentation

## 2016-01-29 DIAGNOSIS — R569 Unspecified convulsions: Secondary | ICD-10-CM | POA: Insufficient documentation

## 2016-01-29 DIAGNOSIS — E781 Pure hyperglyceridemia: Secondary | ICD-10-CM | POA: Insufficient documentation

## 2016-01-29 DIAGNOSIS — F32A Depression, unspecified: Secondary | ICD-10-CM | POA: Insufficient documentation

## 2016-01-29 DIAGNOSIS — I959 Hypotension, unspecified: Secondary | ICD-10-CM | POA: Insufficient documentation

## 2016-02-26 DIAGNOSIS — N183 Chronic kidney disease, stage 3 (moderate): Secondary | ICD-10-CM | POA: Diagnosis not present

## 2016-02-26 DIAGNOSIS — M25562 Pain in left knee: Secondary | ICD-10-CM | POA: Diagnosis not present

## 2016-02-26 DIAGNOSIS — M797 Fibromyalgia: Secondary | ICD-10-CM | POA: Diagnosis not present

## 2016-02-26 DIAGNOSIS — E782 Mixed hyperlipidemia: Secondary | ICD-10-CM | POA: Diagnosis not present

## 2016-02-26 DIAGNOSIS — G8929 Other chronic pain: Secondary | ICD-10-CM | POA: Diagnosis not present

## 2016-02-26 DIAGNOSIS — E559 Vitamin D deficiency, unspecified: Secondary | ICD-10-CM | POA: Diagnosis not present

## 2016-02-26 DIAGNOSIS — M25552 Pain in left hip: Secondary | ICD-10-CM | POA: Diagnosis not present

## 2016-02-26 DIAGNOSIS — I129 Hypertensive chronic kidney disease with stage 1 through stage 4 chronic kidney disease, or unspecified chronic kidney disease: Secondary | ICD-10-CM | POA: Diagnosis not present

## 2016-02-26 DIAGNOSIS — M5414 Radiculopathy, thoracic region: Secondary | ICD-10-CM | POA: Diagnosis not present

## 2016-02-26 DIAGNOSIS — F411 Generalized anxiety disorder: Secondary | ICD-10-CM | POA: Diagnosis not present

## 2016-02-26 DIAGNOSIS — M25561 Pain in right knee: Secondary | ICD-10-CM | POA: Diagnosis not present

## 2016-02-26 DIAGNOSIS — M25551 Pain in right hip: Secondary | ICD-10-CM | POA: Diagnosis not present

## 2016-03-10 DIAGNOSIS — Z853 Personal history of malignant neoplasm of breast: Secondary | ICD-10-CM | POA: Diagnosis not present

## 2016-03-10 DIAGNOSIS — M5414 Radiculopathy, thoracic region: Secondary | ICD-10-CM | POA: Diagnosis not present

## 2016-03-10 DIAGNOSIS — M5124 Other intervertebral disc displacement, thoracic region: Secondary | ICD-10-CM | POA: Diagnosis not present

## 2016-04-05 DIAGNOSIS — N183 Chronic kidney disease, stage 3 (moderate): Secondary | ICD-10-CM | POA: Diagnosis not present

## 2016-04-12 DIAGNOSIS — I129 Hypertensive chronic kidney disease with stage 1 through stage 4 chronic kidney disease, or unspecified chronic kidney disease: Secondary | ICD-10-CM | POA: Diagnosis not present

## 2016-04-12 DIAGNOSIS — N183 Chronic kidney disease, stage 3 (moderate): Secondary | ICD-10-CM | POA: Diagnosis not present

## 2016-04-12 DIAGNOSIS — I480 Paroxysmal atrial fibrillation: Secondary | ICD-10-CM | POA: Diagnosis not present

## 2016-04-12 DIAGNOSIS — E559 Vitamin D deficiency, unspecified: Secondary | ICD-10-CM | POA: Diagnosis not present

## 2016-04-13 DIAGNOSIS — R700 Elevated erythrocyte sedimentation rate: Secondary | ICD-10-CM | POA: Diagnosis not present

## 2016-04-13 DIAGNOSIS — I480 Paroxysmal atrial fibrillation: Secondary | ICD-10-CM | POA: Diagnosis not present

## 2016-04-13 DIAGNOSIS — I10 Essential (primary) hypertension: Secondary | ICD-10-CM | POA: Diagnosis not present

## 2016-04-13 DIAGNOSIS — R5383 Other fatigue: Secondary | ICD-10-CM | POA: Diagnosis not present

## 2016-04-13 DIAGNOSIS — E782 Mixed hyperlipidemia: Secondary | ICD-10-CM | POA: Diagnosis not present

## 2016-04-13 DIAGNOSIS — Z7901 Long term (current) use of anticoagulants: Secondary | ICD-10-CM | POA: Diagnosis not present

## 2016-04-13 DIAGNOSIS — Z8679 Personal history of other diseases of the circulatory system: Secondary | ICD-10-CM | POA: Diagnosis not present

## 2016-04-13 DIAGNOSIS — G4733 Obstructive sleep apnea (adult) (pediatric): Secondary | ICD-10-CM | POA: Diagnosis not present

## 2016-04-13 DIAGNOSIS — Z9889 Other specified postprocedural states: Secondary | ICD-10-CM | POA: Diagnosis not present

## 2016-05-03 ENCOUNTER — Encounter: Payer: Self-pay | Admitting: Neurology

## 2016-05-03 ENCOUNTER — Ambulatory Visit (INDEPENDENT_AMBULATORY_CARE_PROVIDER_SITE_OTHER): Payer: Medicare Other | Admitting: Neurology

## 2016-05-03 VITALS — BP 128/70 | HR 72 | Ht 70.0 in | Wt 244.5 lb

## 2016-05-03 DIAGNOSIS — M797 Fibromyalgia: Secondary | ICD-10-CM | POA: Diagnosis not present

## 2016-05-03 DIAGNOSIS — M545 Low back pain, unspecified: Secondary | ICD-10-CM

## 2016-05-03 DIAGNOSIS — G8929 Other chronic pain: Secondary | ICD-10-CM

## 2016-05-03 DIAGNOSIS — G43009 Migraine without aura, not intractable, without status migrainosus: Secondary | ICD-10-CM | POA: Diagnosis not present

## 2016-05-03 MED ORDER — GABAPENTIN 300 MG PO CAPS: ORAL_CAPSULE | ORAL | Status: DC

## 2016-05-03 NOTE — Patient Instructions (Signed)

## 2016-05-03 NOTE — Progress Notes (Signed)
Reason for visit: Fibromyalgia  Debbie [name] is an 70 y.o. female  History of present illness:  Debbie [name] is a 70 year old right-handed white female with a history of chronic pain associated with fibromyalgia. The patient hurts in the neck, shoulders, back, and legs. The patient has been to the emergency room in February 2017 with right-sided flank and chest discomfort. The patient had a fall about 2 weeks prior to this evaluation, she has had some increased soreness associated with the fall. The patient was going up some steps and caught her toe, and fell forward. The patient is not sleeping well because of the discomfort. The patient takes hydrocodone one half tablet 3 times a day on average. She is on gabapentin taking 300 mg the morning and 600 mg in the evening. She is followed by a pain center, she indicates that she did have a recent MRI of the spine, the result is not available to me. The patient was told that she had a disc bulge. She uses a cane for ambulation, no further falls have been noted.  Past Medical History  Diagnosis Date  . Hypertension   . Atrial fibrillation ([place])   . Depression with anxiety   . Fibromyalgia   . Breast cancer ([place])   . Muscle tension headache   . History of partial seizures   . Benign essential tremor   . Obesity   . Drowsiness     Excessive daytime  . Obstructive sleep apnea   . Vitamin B12 deficiency   . Tremors of nervous system   . Dyslipidemia   . Chronic renal insufficiency   . Seizures ([facility])     Partial  . Memory disturbance   . Sprain of neck 06/26/2013  . Common migraine 05/07/2015  . S/P epidural steroid injection     Past Surgical History  Procedure Laterality Date  . Brain meningioma excision Right   . Mastectomies Bilateral   . Abdominal hysterectomy    . Breast reconstructive    . Breast reconstruction Bilateral   . Lumbosacral    . Lumbar spine surgery N/A   . Tubal ligation N/A   . Gallbladder resection    .  Cholecystectomy    . Basal cell carcinoma excision    . Basal cell carcinoma excision    . Nose surgery      Basal cell carcinoma resection from the nose  . Dilution    . Dilation and curettage of uterus    . Ablation procedure      Cardiac, for Afib    Family History  Problem Relation Age of Onset  . Heart failure Mother   . Diabetes Mother   . Cancer - Colon Father   . Breast cancer Sister   . Stroke Maternal Grandmother   . Diabetes Maternal Grandfather   . Heart failure Maternal Grandfather     Social history:  reports that she has never smoked. She has never used smokeless tobacco. She reports that she does not drink alcohol or use illicit drugs.    Allergies  Allergen Reactions  . Adhesive [Tape] Other (See Comments)    Peels skin off (bandaids, too)  . Atorvastatin Other (See Comments)  . Tramadol Other (See Comments) and Nausea And Vomiting  . Buprenorphine Hcl Nausea And Vomiting  . Codeine Nausea And Vomiting  . Fish Oil Nausea And Vomiting  . Morphine And Related Nausea And Vomiting    Medications:  Prior to Admission medications  Medication Sig Start Date End Date Taking? Authorizing Provider  acetaminophen (TYLENOL) 500 MG tablet Take 1,000 mg by mouth every 6 (six) hours as needed.   Yes Historical Provider, MD  amiodarone (PACERONE) 200 MG tablet Take 200 mg by mouth. 04/13/16 05/13/16 Yes Historical Provider, MD  aspirin 325 MG tablet Take 325 mg by mouth daily.   Yes Historical Provider, MD  calcium carbonate (OS-CAL) 600 MG TABS tablet Take 600 mg by mouth daily with breakfast.   Yes Historical Provider, MD  Cholecalciferol (VITAMIN D3) 1000 units CAPS Take 1 capsule by mouth daily.   Yes Historical Provider, MD  cyanocobalamin 1000 MCG tablet Take 100 mcg by mouth daily.   Yes Historical Provider, MD  diltiazem (DILACOR XR) 120 MG 24 hr capsule Take 120 mg by mouth. Take every 12 hours as needed 10/14/14  Yes Historical Provider, MD  gabapentin  (NEURONTIN) 300 MG capsule TAKE ONE CAPSULE BY MOUTH IN THE MORNING AND TWO IN THE EVENING 11/13/15  Yes Kathrynn Ducking, MD  HYDROcodone-acetaminophen (NORCO/VICODIN) 5-325 MG tablet Take 1 tablet by mouth every 6 (six) hours as needed for moderate pain.   Yes Historical Provider, MD  lamoTRIgine (LAMICTAL) 25 MG tablet Take 1 tablet (25 mg total) by mouth 2 (two) times daily. 11/13/15  Yes Kathrynn Ducking, MD  promethazine (PHENERGAN) 25 MG tablet Take 1 tablet (25 mg total) by mouth every 6 (six) hours as needed for nausea or vomiting. 11/12/14  Yes Kathrynn Ducking, MD  Rivaroxaban (XARELTO) 15 MG TABS tablet Take 15 mg by mouth daily with supper.   Yes Historical Provider, MD  tiZANidine (ZANAFLEX) 2 MG tablet Take 1 tablet (2 mg total) by mouth 3 (three) times daily. 05/19/15  Yes Kathrynn Ducking, MD    ROS:  Out of a complete 14 system review of symptoms, the patient complains only of the following symptoms, and all other reviewed systems are negative.  Fatigue Runny nose, difficulty swallowing Eye itching, eye redness, eye pain Cough, shortness of breath Atrial fibrillation Cold intolerance, heat intolerance, flushing Constipation, nausea Insomnia Daytime sleepiness, snoring Environmental allergies Frequency of urination, urinary urgency Joint pain, joint swelling, back pain, achy muscles, muscle cramps, walking difficulty Moles Bruising easily Dizziness, headache, weakness, tremors Anxiety  Blood pressure 128/70, pulse 72, height 5\' 10"  (1.778 m), weight 244 lb 8 oz (110.904 kg).  Physical Exam  General: The patient is alert and cooperative at the time of the examination. The patient is moderately to markedly obese.  Skin: 1+ edema at the ankles is noted bilaterally.   Neurologic Exam  Mental status: The patient is alert and oriented x 3 at the time of the examination. The patient has apparent normal recent and remote memory, with an apparently normal attention span and  concentration ability.   Cranial nerves: Facial symmetry is present. Speech is normal, no aphasia or dysarthria is noted. Extraocular movements are full. Visual fields are full.  Motor: The patient has good strength in all 4 extremities.  Sensory examination: Soft touch sensation is symmetric on the face, arms, and legs.  Coordination: The patient has good finger-nose-finger and heel-to-shin bilaterally.  Gait and station: The patient has a slightly wide-based gait, the patient uses a cane for ambulation. Tandem gait is unsteady. Romberg is negative. No drift is seen.  Reflexes: Deep tendon reflexes are symmetric, but are depressed.   Assessment/Plan:  1. Fibromyalgia  2. Gait disorder  3. History of migraine headache  4. Chronic  low back pain  The patient will be increased on the gabapentin taking 300 mg in the morning and at noon, 600 mg in the evening. Another prescription was called in for her. The patient will follow-up in 6 months, sooner if needed. Hopefully the increase in the gabapentin may help her overall pain syndrome. The patient is very inactive during the day, I have encouraged her to exercise regularly as this will help the fibromyalgia pain.  Jill Alexanders MD 05/03/2016 7:27 PM  [facility] [address] [place], [place] [address]  Phone [phone] Fax [phone]

## 2016-05-16 DIAGNOSIS — E559 Vitamin D deficiency, unspecified: Secondary | ICD-10-CM | POA: Diagnosis not present

## 2016-05-16 DIAGNOSIS — I480 Paroxysmal atrial fibrillation: Secondary | ICD-10-CM | POA: Diagnosis not present

## 2016-05-16 DIAGNOSIS — E782 Mixed hyperlipidemia: Secondary | ICD-10-CM | POA: Diagnosis not present

## 2016-05-16 DIAGNOSIS — M797 Fibromyalgia: Secondary | ICD-10-CM | POA: Diagnosis not present

## 2016-05-16 DIAGNOSIS — N183 Chronic kidney disease, stage 3 (moderate): Secondary | ICD-10-CM | POA: Diagnosis not present

## 2016-05-16 DIAGNOSIS — I10 Essential (primary) hypertension: Secondary | ICD-10-CM | POA: Diagnosis not present

## 2016-05-16 DIAGNOSIS — R569 Unspecified convulsions: Secondary | ICD-10-CM | POA: Diagnosis not present

## 2016-05-16 DIAGNOSIS — F339 Major depressive disorder, recurrent, unspecified: Secondary | ICD-10-CM | POA: Diagnosis not present

## 2016-05-16 DIAGNOSIS — I129 Hypertensive chronic kidney disease with stage 1 through stage 4 chronic kidney disease, or unspecified chronic kidney disease: Secondary | ICD-10-CM | POA: Diagnosis not present

## 2016-05-16 DIAGNOSIS — F411 Generalized anxiety disorder: Secondary | ICD-10-CM | POA: Diagnosis not present

## 2016-05-17 DIAGNOSIS — I10 Essential (primary) hypertension: Secondary | ICD-10-CM | POA: Diagnosis not present

## 2016-05-17 DIAGNOSIS — Z7901 Long term (current) use of anticoagulants: Secondary | ICD-10-CM | POA: Diagnosis not present

## 2016-05-17 DIAGNOSIS — M2550 Pain in unspecified joint: Secondary | ICD-10-CM | POA: Diagnosis not present

## 2016-05-17 DIAGNOSIS — M25512 Pain in left shoulder: Secondary | ICD-10-CM | POA: Diagnosis not present

## 2016-05-17 DIAGNOSIS — M7552 Bursitis of left shoulder: Secondary | ICD-10-CM | POA: Diagnosis not present

## 2016-05-17 DIAGNOSIS — N183 Chronic kidney disease, stage 3 (moderate): Secondary | ICD-10-CM | POA: Diagnosis not present

## 2016-05-17 DIAGNOSIS — M7711 Lateral epicondylitis, right elbow: Secondary | ICD-10-CM | POA: Diagnosis not present

## 2016-05-21 ENCOUNTER — Other Ambulatory Visit: Payer: Self-pay | Admitting: Neurology

## 2016-06-08 DIAGNOSIS — H5711 Ocular pain, right eye: Secondary | ICD-10-CM | POA: Diagnosis not present

## 2016-06-09 DIAGNOSIS — G4733 Obstructive sleep apnea (adult) (pediatric): Secondary | ICD-10-CM | POA: Diagnosis not present

## 2016-06-09 DIAGNOSIS — I480 Paroxysmal atrial fibrillation: Secondary | ICD-10-CM | POA: Diagnosis not present

## 2016-06-09 DIAGNOSIS — I10 Essential (primary) hypertension: Secondary | ICD-10-CM | POA: Diagnosis not present

## 2016-06-09 DIAGNOSIS — Z79899 Other long term (current) drug therapy: Secondary | ICD-10-CM | POA: Diagnosis not present

## 2016-06-09 DIAGNOSIS — R5383 Other fatigue: Secondary | ICD-10-CM | POA: Diagnosis not present

## 2016-06-15 DIAGNOSIS — R5383 Other fatigue: Secondary | ICD-10-CM | POA: Diagnosis not present

## 2016-06-15 DIAGNOSIS — I480 Paroxysmal atrial fibrillation: Secondary | ICD-10-CM | POA: Diagnosis not present

## 2016-06-15 DIAGNOSIS — R002 Palpitations: Secondary | ICD-10-CM | POA: Diagnosis not present

## 2016-06-15 DIAGNOSIS — Z79899 Other long term (current) drug therapy: Secondary | ICD-10-CM | POA: Diagnosis not present

## 2016-06-21 DIAGNOSIS — I480 Paroxysmal atrial fibrillation: Secondary | ICD-10-CM | POA: Diagnosis not present

## 2016-06-21 DIAGNOSIS — R5383 Other fatigue: Secondary | ICD-10-CM | POA: Diagnosis not present

## 2016-06-21 DIAGNOSIS — Z79899 Other long term (current) drug therapy: Secondary | ICD-10-CM | POA: Diagnosis not present

## 2016-07-07 DIAGNOSIS — Z79899 Other long term (current) drug therapy: Secondary | ICD-10-CM | POA: Diagnosis not present

## 2016-07-07 DIAGNOSIS — Z8679 Personal history of other diseases of the circulatory system: Secondary | ICD-10-CM | POA: Diagnosis not present

## 2016-07-07 DIAGNOSIS — I480 Paroxysmal atrial fibrillation: Secondary | ICD-10-CM | POA: Diagnosis not present

## 2016-07-07 DIAGNOSIS — N183 Chronic kidney disease, stage 3 (moderate): Secondary | ICD-10-CM | POA: Diagnosis not present

## 2016-07-07 DIAGNOSIS — I10 Essential (primary) hypertension: Secondary | ICD-10-CM | POA: Diagnosis not present

## 2016-07-07 DIAGNOSIS — Z9889 Other specified postprocedural states: Secondary | ICD-10-CM | POA: Diagnosis not present

## 2016-07-07 DIAGNOSIS — R5383 Other fatigue: Secondary | ICD-10-CM | POA: Diagnosis not present

## 2016-07-07 DIAGNOSIS — G4733 Obstructive sleep apnea (adult) (pediatric): Secondary | ICD-10-CM | POA: Diagnosis not present

## 2016-08-15 DIAGNOSIS — Z9882 Breast implant status: Secondary | ICD-10-CM | POA: Diagnosis not present

## 2016-08-15 DIAGNOSIS — I340 Nonrheumatic mitral (valve) insufficiency: Secondary | ICD-10-CM | POA: Diagnosis not present

## 2016-08-15 DIAGNOSIS — I480 Paroxysmal atrial fibrillation: Secondary | ICD-10-CM | POA: Diagnosis not present

## 2016-08-15 DIAGNOSIS — Z01811 Encounter for preprocedural respiratory examination: Secondary | ICD-10-CM | POA: Diagnosis not present

## 2016-08-15 DIAGNOSIS — I700 Atherosclerosis of aorta: Secondary | ICD-10-CM | POA: Diagnosis not present

## 2016-08-17 DIAGNOSIS — G4733 Obstructive sleep apnea (adult) (pediatric): Secondary | ICD-10-CM | POA: Diagnosis not present

## 2016-08-17 DIAGNOSIS — I480 Paroxysmal atrial fibrillation: Secondary | ICD-10-CM | POA: Diagnosis not present

## 2016-08-17 DIAGNOSIS — R569 Unspecified convulsions: Secondary | ICD-10-CM | POA: Diagnosis not present

## 2016-08-17 DIAGNOSIS — I129 Hypertensive chronic kidney disease with stage 1 through stage 4 chronic kidney disease, or unspecified chronic kidney disease: Secondary | ICD-10-CM | POA: Diagnosis not present

## 2016-08-17 DIAGNOSIS — Z6836 Body mass index (BMI) 36.0-36.9, adult: Secondary | ICD-10-CM | POA: Diagnosis not present

## 2016-08-17 DIAGNOSIS — E669 Obesity, unspecified: Secondary | ICD-10-CM | POA: Diagnosis not present

## 2016-08-17 DIAGNOSIS — Z7982 Long term (current) use of aspirin: Secondary | ICD-10-CM | POA: Diagnosis not present

## 2016-08-17 DIAGNOSIS — R0789 Other chest pain: Secondary | ICD-10-CM | POA: Diagnosis not present

## 2016-08-17 DIAGNOSIS — M797 Fibromyalgia: Secondary | ICD-10-CM | POA: Diagnosis not present

## 2016-08-17 DIAGNOSIS — I313 Pericardial effusion (noninflammatory): Secondary | ICD-10-CM | POA: Diagnosis not present

## 2016-08-17 DIAGNOSIS — N183 Chronic kidney disease, stage 3 (moderate): Secondary | ICD-10-CM | POA: Diagnosis not present

## 2016-08-17 DIAGNOSIS — I4891 Unspecified atrial fibrillation: Secondary | ICD-10-CM | POA: Diagnosis not present

## 2016-08-17 DIAGNOSIS — R001 Bradycardia, unspecified: Secondary | ICD-10-CM | POA: Diagnosis not present

## 2016-08-17 DIAGNOSIS — Z885 Allergy status to narcotic agent status: Secondary | ICD-10-CM | POA: Diagnosis not present

## 2016-08-17 DIAGNOSIS — Z79899 Other long term (current) drug therapy: Secondary | ICD-10-CM | POA: Diagnosis not present

## 2016-08-18 DIAGNOSIS — M797 Fibromyalgia: Secondary | ICD-10-CM | POA: Diagnosis not present

## 2016-08-18 DIAGNOSIS — I313 Pericardial effusion (noninflammatory): Secondary | ICD-10-CM | POA: Diagnosis not present

## 2016-08-18 DIAGNOSIS — R0602 Shortness of breath: Secondary | ICD-10-CM | POA: Diagnosis not present

## 2016-08-18 DIAGNOSIS — I480 Paroxysmal atrial fibrillation: Secondary | ICD-10-CM | POA: Diagnosis not present

## 2016-08-18 DIAGNOSIS — I10 Essential (primary) hypertension: Secondary | ICD-10-CM | POA: Diagnosis not present

## 2016-08-18 DIAGNOSIS — N183 Chronic kidney disease, stage 3 (moderate): Secondary | ICD-10-CM | POA: Diagnosis not present

## 2016-08-18 DIAGNOSIS — Z79899 Other long term (current) drug therapy: Secondary | ICD-10-CM | POA: Diagnosis not present

## 2016-08-18 DIAGNOSIS — R079 Chest pain, unspecified: Secondary | ICD-10-CM | POA: Diagnosis not present

## 2016-08-18 DIAGNOSIS — I129 Hypertensive chronic kidney disease with stage 1 through stage 4 chronic kidney disease, or unspecified chronic kidney disease: Secondary | ICD-10-CM | POA: Diagnosis not present

## 2016-08-18 DIAGNOSIS — R0789 Other chest pain: Secondary | ICD-10-CM | POA: Diagnosis not present

## 2016-08-18 DIAGNOSIS — G4733 Obstructive sleep apnea (adult) (pediatric): Secondary | ICD-10-CM | POA: Diagnosis not present

## 2016-08-25 DIAGNOSIS — Z9889 Other specified postprocedural states: Secondary | ICD-10-CM | POA: Diagnosis not present

## 2016-08-25 DIAGNOSIS — I480 Paroxysmal atrial fibrillation: Secondary | ICD-10-CM | POA: Diagnosis not present

## 2016-08-25 DIAGNOSIS — J90 Pleural effusion, not elsewhere classified: Secondary | ICD-10-CM | POA: Diagnosis not present

## 2016-08-25 DIAGNOSIS — R0789 Other chest pain: Secondary | ICD-10-CM | POA: Diagnosis not present

## 2016-08-25 DIAGNOSIS — I313 Pericardial effusion (noninflammatory): Secondary | ICD-10-CM | POA: Diagnosis not present

## 2016-08-25 DIAGNOSIS — I10 Essential (primary) hypertension: Secondary | ICD-10-CM | POA: Diagnosis not present

## 2016-08-25 DIAGNOSIS — R079 Chest pain, unspecified: Secondary | ICD-10-CM | POA: Diagnosis not present

## 2016-08-25 DIAGNOSIS — R12 Heartburn: Secondary | ICD-10-CM | POA: Diagnosis not present

## 2016-08-29 DIAGNOSIS — I4891 Unspecified atrial fibrillation: Secondary | ICD-10-CM | POA: Diagnosis not present

## 2016-08-29 DIAGNOSIS — I309 Acute pericarditis, unspecified: Secondary | ICD-10-CM | POA: Diagnosis not present

## 2016-08-29 DIAGNOSIS — N183 Chronic kidney disease, stage 3 (moderate): Secondary | ICD-10-CM | POA: Diagnosis not present

## 2016-08-29 DIAGNOSIS — Z9889 Other specified postprocedural states: Secondary | ICD-10-CM | POA: Diagnosis not present

## 2016-08-29 DIAGNOSIS — G4733 Obstructive sleep apnea (adult) (pediatric): Secondary | ICD-10-CM | POA: Diagnosis not present

## 2016-08-29 DIAGNOSIS — I313 Pericardial effusion (noninflammatory): Secondary | ICD-10-CM | POA: Insufficient documentation

## 2016-08-29 DIAGNOSIS — I3139 Other pericardial effusion (noninflammatory): Secondary | ICD-10-CM | POA: Insufficient documentation

## 2016-08-29 DIAGNOSIS — R0789 Other chest pain: Secondary | ICD-10-CM | POA: Diagnosis not present

## 2016-08-29 DIAGNOSIS — Z8679 Personal history of other diseases of the circulatory system: Secondary | ICD-10-CM | POA: Diagnosis not present

## 2016-09-05 DIAGNOSIS — I2699 Other pulmonary embolism without acute cor pulmonale: Secondary | ICD-10-CM | POA: Diagnosis not present

## 2016-09-05 DIAGNOSIS — R2232 Localized swelling, mass and lump, left upper limb: Secondary | ICD-10-CM | POA: Diagnosis not present

## 2016-09-05 DIAGNOSIS — Z79899 Other long term (current) drug therapy: Secondary | ICD-10-CM | POA: Diagnosis not present

## 2016-09-05 DIAGNOSIS — I481 Persistent atrial fibrillation: Secondary | ICD-10-CM | POA: Diagnosis not present

## 2016-09-05 DIAGNOSIS — I313 Pericardial effusion (noninflammatory): Secondary | ICD-10-CM | POA: Diagnosis not present

## 2016-09-05 DIAGNOSIS — J918 Pleural effusion in other conditions classified elsewhere: Secondary | ICD-10-CM | POA: Diagnosis not present

## 2016-09-05 DIAGNOSIS — R0902 Hypoxemia: Secondary | ICD-10-CM | POA: Diagnosis not present

## 2016-09-05 DIAGNOSIS — I10 Essential (primary) hypertension: Secondary | ICD-10-CM | POA: Diagnosis not present

## 2016-09-05 DIAGNOSIS — Z7982 Long term (current) use of aspirin: Secondary | ICD-10-CM | POA: Diagnosis not present

## 2016-09-05 DIAGNOSIS — R071 Chest pain on breathing: Secondary | ICD-10-CM | POA: Diagnosis not present

## 2016-09-05 DIAGNOSIS — E782 Mixed hyperlipidemia: Secondary | ICD-10-CM | POA: Diagnosis not present

## 2016-09-05 DIAGNOSIS — R079 Chest pain, unspecified: Secondary | ICD-10-CM | POA: Diagnosis not present

## 2016-09-05 DIAGNOSIS — J181 Lobar pneumonia, unspecified organism: Secondary | ICD-10-CM | POA: Diagnosis not present

## 2016-09-05 DIAGNOSIS — Z6834 Body mass index (BMI) 34.0-34.9, adult: Secondary | ICD-10-CM | POA: Diagnosis not present

## 2016-09-05 DIAGNOSIS — J189 Pneumonia, unspecified organism: Secondary | ICD-10-CM | POA: Diagnosis not present

## 2016-09-05 DIAGNOSIS — E785 Hyperlipidemia, unspecified: Secondary | ICD-10-CM | POA: Diagnosis not present

## 2016-09-05 DIAGNOSIS — K5792 Diverticulitis of intestine, part unspecified, without perforation or abscess without bleeding: Secondary | ICD-10-CM | POA: Diagnosis not present

## 2016-09-05 DIAGNOSIS — Z79891 Long term (current) use of opiate analgesic: Secondary | ICD-10-CM | POA: Diagnosis not present

## 2016-09-05 DIAGNOSIS — G40909 Epilepsy, unspecified, not intractable, without status epilepticus: Secondary | ICD-10-CM | POA: Diagnosis present

## 2016-09-05 DIAGNOSIS — Z791 Long term (current) use of non-steroidal anti-inflammatories (NSAID): Secondary | ICD-10-CM | POA: Diagnosis not present

## 2016-09-05 DIAGNOSIS — Z5181 Encounter for therapeutic drug level monitoring: Secondary | ICD-10-CM | POA: Diagnosis not present

## 2016-09-05 DIAGNOSIS — R05 Cough: Secondary | ICD-10-CM | POA: Diagnosis not present

## 2016-09-05 DIAGNOSIS — I2782 Chronic pulmonary embolism: Secondary | ICD-10-CM | POA: Insufficient documentation

## 2016-09-05 DIAGNOSIS — R5382 Chronic fatigue, unspecified: Secondary | ICD-10-CM | POA: Diagnosis present

## 2016-09-05 DIAGNOSIS — R509 Fever, unspecified: Secondary | ICD-10-CM | POA: Diagnosis not present

## 2016-09-05 DIAGNOSIS — R278 Other lack of coordination: Secondary | ICD-10-CM | POA: Diagnosis not present

## 2016-09-05 DIAGNOSIS — M797 Fibromyalgia: Secondary | ICD-10-CM | POA: Diagnosis present

## 2016-09-05 DIAGNOSIS — J90 Pleural effusion, not elsewhere classified: Secondary | ICD-10-CM | POA: Diagnosis not present

## 2016-09-05 DIAGNOSIS — R2689 Other abnormalities of gait and mobility: Secondary | ICD-10-CM | POA: Diagnosis not present

## 2016-09-05 DIAGNOSIS — I480 Paroxysmal atrial fibrillation: Secondary | ICD-10-CM | POA: Diagnosis not present

## 2016-09-05 DIAGNOSIS — R569 Unspecified convulsions: Secondary | ICD-10-CM | POA: Diagnosis not present

## 2016-09-05 DIAGNOSIS — E876 Hypokalemia: Secondary | ICD-10-CM | POA: Diagnosis not present

## 2016-09-05 DIAGNOSIS — R0602 Shortness of breath: Secondary | ICD-10-CM | POA: Diagnosis not present

## 2016-09-05 DIAGNOSIS — R0789 Other chest pain: Secondary | ICD-10-CM | POA: Diagnosis not present

## 2016-09-05 DIAGNOSIS — J40 Bronchitis, not specified as acute or chronic: Secondary | ICD-10-CM | POA: Diagnosis present

## 2016-09-05 DIAGNOSIS — I1310 Hypertensive heart and chronic kidney disease without heart failure, with stage 1 through stage 4 chronic kidney disease, or unspecified chronic kidney disease: Secondary | ICD-10-CM | POA: Diagnosis not present

## 2016-09-05 DIAGNOSIS — R0609 Other forms of dyspnea: Secondary | ICD-10-CM | POA: Diagnosis not present

## 2016-09-05 DIAGNOSIS — M19042 Primary osteoarthritis, left hand: Secondary | ICD-10-CM | POA: Diagnosis not present

## 2016-09-05 DIAGNOSIS — E669 Obesity, unspecified: Secondary | ICD-10-CM | POA: Diagnosis present

## 2016-09-05 DIAGNOSIS — G4733 Obstructive sleep apnea (adult) (pediatric): Secondary | ICD-10-CM | POA: Diagnosis not present

## 2016-09-05 DIAGNOSIS — I129 Hypertensive chronic kidney disease with stage 1 through stage 4 chronic kidney disease, or unspecified chronic kidney disease: Secondary | ICD-10-CM | POA: Diagnosis not present

## 2016-09-05 DIAGNOSIS — R1314 Dysphagia, pharyngoesophageal phase: Secondary | ICD-10-CM | POA: Diagnosis not present

## 2016-09-05 DIAGNOSIS — N183 Chronic kidney disease, stage 3 (moderate): Secondary | ICD-10-CM | POA: Diagnosis not present

## 2016-09-05 DIAGNOSIS — Z8249 Family history of ischemic heart disease and other diseases of the circulatory system: Secondary | ICD-10-CM | POA: Diagnosis not present

## 2016-09-05 DIAGNOSIS — R531 Weakness: Secondary | ICD-10-CM | POA: Diagnosis not present

## 2016-09-06 DIAGNOSIS — I2782 Chronic pulmonary embolism: Secondary | ICD-10-CM | POA: Insufficient documentation

## 2016-09-06 DIAGNOSIS — I2699 Other pulmonary embolism without acute cor pulmonale: Secondary | ICD-10-CM | POA: Insufficient documentation

## 2016-09-13 DIAGNOSIS — R531 Weakness: Secondary | ICD-10-CM | POA: Diagnosis not present

## 2016-09-13 DIAGNOSIS — R278 Other lack of coordination: Secondary | ICD-10-CM | POA: Diagnosis not present

## 2016-09-13 DIAGNOSIS — R110 Nausea: Secondary | ICD-10-CM | POA: Diagnosis not present

## 2016-09-13 DIAGNOSIS — R0602 Shortness of breath: Secondary | ICD-10-CM | POA: Diagnosis not present

## 2016-09-13 DIAGNOSIS — R112 Nausea with vomiting, unspecified: Secondary | ICD-10-CM | POA: Diagnosis not present

## 2016-09-13 DIAGNOSIS — E785 Hyperlipidemia, unspecified: Secondary | ICD-10-CM | POA: Diagnosis not present

## 2016-09-13 DIAGNOSIS — R1084 Generalized abdominal pain: Secondary | ICD-10-CM | POA: Diagnosis not present

## 2016-09-13 DIAGNOSIS — Z5181 Encounter for therapeutic drug level monitoring: Secondary | ICD-10-CM | POA: Diagnosis not present

## 2016-09-13 DIAGNOSIS — R1319 Other dysphagia: Secondary | ICD-10-CM | POA: Diagnosis not present

## 2016-09-13 DIAGNOSIS — I480 Paroxysmal atrial fibrillation: Secondary | ICD-10-CM | POA: Diagnosis not present

## 2016-09-13 DIAGNOSIS — B370 Candidal stomatitis: Secondary | ICD-10-CM | POA: Diagnosis not present

## 2016-09-13 DIAGNOSIS — J90 Pleural effusion, not elsewhere classified: Secondary | ICD-10-CM | POA: Diagnosis not present

## 2016-09-13 DIAGNOSIS — J918 Pleural effusion in other conditions classified elsewhere: Secondary | ICD-10-CM | POA: Diagnosis not present

## 2016-09-13 DIAGNOSIS — R2689 Other abnormalities of gait and mobility: Secondary | ICD-10-CM | POA: Diagnosis not present

## 2016-09-13 DIAGNOSIS — I313 Pericardial effusion (noninflammatory): Secondary | ICD-10-CM | POA: Diagnosis not present

## 2016-09-13 DIAGNOSIS — G4739 Other sleep apnea: Secondary | ICD-10-CM | POA: Diagnosis not present

## 2016-09-13 DIAGNOSIS — I2699 Other pulmonary embolism without acute cor pulmonale: Secondary | ICD-10-CM | POA: Diagnosis not present

## 2016-09-13 DIAGNOSIS — E784 Other hyperlipidemia: Secondary | ICD-10-CM | POA: Diagnosis not present

## 2016-09-13 DIAGNOSIS — I10 Essential (primary) hypertension: Secondary | ICD-10-CM | POA: Diagnosis not present

## 2016-09-13 DIAGNOSIS — J189 Pneumonia, unspecified organism: Secondary | ICD-10-CM | POA: Diagnosis not present

## 2016-09-13 DIAGNOSIS — R1314 Dysphagia, pharyngoesophageal phase: Secondary | ICD-10-CM | POA: Diagnosis not present

## 2016-09-14 DIAGNOSIS — J90 Pleural effusion, not elsewhere classified: Secondary | ICD-10-CM | POA: Diagnosis not present

## 2016-09-14 DIAGNOSIS — I480 Paroxysmal atrial fibrillation: Secondary | ICD-10-CM | POA: Diagnosis not present

## 2016-09-14 DIAGNOSIS — J189 Pneumonia, unspecified organism: Secondary | ICD-10-CM | POA: Diagnosis not present

## 2016-09-14 DIAGNOSIS — I2699 Other pulmonary embolism without acute cor pulmonale: Secondary | ICD-10-CM | POA: Diagnosis not present

## 2016-09-15 DIAGNOSIS — E784 Other hyperlipidemia: Secondary | ICD-10-CM | POA: Diagnosis not present

## 2016-09-15 DIAGNOSIS — G4739 Other sleep apnea: Secondary | ICD-10-CM | POA: Diagnosis not present

## 2016-09-15 DIAGNOSIS — I480 Paroxysmal atrial fibrillation: Secondary | ICD-10-CM | POA: Diagnosis not present

## 2016-09-15 DIAGNOSIS — J189 Pneumonia, unspecified organism: Secondary | ICD-10-CM | POA: Diagnosis not present

## 2016-09-15 DIAGNOSIS — I2699 Other pulmonary embolism without acute cor pulmonale: Secondary | ICD-10-CM | POA: Diagnosis not present

## 2016-09-15 DIAGNOSIS — I10 Essential (primary) hypertension: Secondary | ICD-10-CM | POA: Diagnosis not present

## 2016-09-16 DIAGNOSIS — R1084 Generalized abdominal pain: Secondary | ICD-10-CM | POA: Diagnosis not present

## 2016-09-19 DIAGNOSIS — R110 Nausea: Secondary | ICD-10-CM | POA: Diagnosis not present

## 2016-09-19 DIAGNOSIS — J90 Pleural effusion, not elsewhere classified: Secondary | ICD-10-CM | POA: Diagnosis not present

## 2016-09-19 DIAGNOSIS — I480 Paroxysmal atrial fibrillation: Secondary | ICD-10-CM | POA: Diagnosis not present

## 2016-09-19 DIAGNOSIS — I313 Pericardial effusion (noninflammatory): Secondary | ICD-10-CM | POA: Diagnosis not present

## 2016-09-22 ENCOUNTER — Encounter: Payer: Self-pay | Admitting: Nurse Practitioner

## 2016-09-22 ENCOUNTER — Ambulatory Visit (INDEPENDENT_AMBULATORY_CARE_PROVIDER_SITE_OTHER): Payer: Medicare Other | Admitting: Nurse Practitioner

## 2016-09-22 VITALS — BP 132/76 | HR 87 | Ht 70.0 in | Wt 231.0 lb

## 2016-09-22 DIAGNOSIS — R1319 Other dysphagia: Secondary | ICD-10-CM

## 2016-09-22 DIAGNOSIS — B370 Candidal stomatitis: Secondary | ICD-10-CM

## 2016-09-22 MED ORDER — FLUCONAZOLE 100 MG PO TABS: 100.0000 mg | ORAL_TABLET | Freq: Every day | ORAL | 0 refills | Status: DC

## 2016-09-22 NOTE — Patient Instructions (Addendum)
Call back in 10 days with a condition update ask to speak to [facility].   We have sent the following medications to your pharmacy for you to pick up at your convenience: Diflucan 100 mg daily for 10 days. Take 200 mg the first day.

## 2016-09-24 NOTE — Progress Notes (Signed)
HPI:  Patient is a 70 yo female with multiple medical problems not limited to recent left lung PNA, recent mild pericardial effusion, hx of hypertension, atrial fibrillation status post recent ablation, pulmonary embolism, and stage 3 CKD. She currently resides at [facility] and is referred by Reynold Bowen, M.D.for evaluation of dysphagia.  Patient gives a two-month history of dysphagia, mainly to solids. No odynophagia. Recently hospitalized with pneumonia, also found to have thrush.  Patient stopped nystatin swish and swallow after 3 days because it caused nausea. Reports recent 13 pound weight loss    Past Medical History:  Diagnosis Date  . Atrial fibrillation ([place])   . Benign essential tremor   . Breast cancer ([place])   . Chronic renal insufficiency   . Common migraine 05/07/2015  . Depression with anxiety   . Drowsiness    Excessive daytime  . Dyslipidemia   . Fibromyalgia   . History of partial seizures   . Hypertension   . Memory disturbance   . Muscle tension headache   . Obesity   . Obstructive sleep apnea   . S/P epidural steroid injection   . Seizures ([place])    Partial  . Sprain of neck 06/26/2013  . Tremors of nervous system   . Vitamin B12 deficiency      Past Surgical History:  Procedure Laterality Date  . ABDOMINAL HYSTERECTOMY    . ablation procedure     Cardiac, for Afib  . BASAL CELL CARCINOMA EXCISION    . BASAL CELL CARCINOMA EXCISION    . BRAIN MENINGIOMA EXCISION Right   . BREAST RECONSTRUCTION Bilateral   . breast reconstructive    . CHOLECYSTECTOMY    . DILATION AND CURETTAGE OF UTERUS    . dilution    . gallbladder resection    . LUMBAR SPINE SURGERY N/A   . lumbosacral    . mastectomies Bilateral   . NOSE SURGERY     Basal cell carcinoma resection from the nose  . TUBAL LIGATION N/A    Family History  Problem Relation Age of Onset  . Heart failure Mother   . Diabetes Mother   . Cancer - Colon Father   . Breast cancer Sister     . Diabetes Maternal Grandfather   . Heart failure Maternal Grandfather   . Stroke Maternal Grandmother    Social History  Substance Use Topics  . Smoking status: Never Smoker  . Smokeless tobacco: Never Used  . Alcohol use No   Current Outpatient Prescriptions  Medication Sig Dispense Refill  . acetaminophen (TYLENOL) 500 MG tablet Take 1,000 mg by mouth every 6 (six) hours as needed.    . bisacodyl (DULCOLAX) 5 MG EC tablet Take 10 mg by mouth daily as needed for moderate constipation.    . calcium carbonate (OS-CAL) 600 MG TABS tablet Take 600 mg by mouth daily with breakfast.    . calcium carbonate (TUMS EX) 750 MG chewable tablet Chew 1 tablet by mouth as needed for heartburn.    . Cholecalciferol (VITAMIN D3) 1000 units CAPS Take 1 capsule by mouth daily.    . cyanocobalamin 1000 MCG tablet Take 100 mcg by mouth daily.    . Ergocalciferol (VITAMIN D2) 2000 units TABS Take 1 tablet by mouth daily.    . furosemide (LASIX) 20 MG tablet Take 20 mg by mouth daily.    Marland Kitchen gabapentin (NEURONTIN) 300 MG capsule TAKE ONE CAPSULE BY MOUTH IN THE MORNING AND AT  NOON, AND TWO IN THE EVENING (Patient taking differently: 300 mg 4 (four) times daily. ) 360 capsule 1  . HYDROcodone-acetaminophen (NORCO/VICODIN) 5-325 MG tablet Take 1 tablet by mouth every 6 (six) hours as needed for moderate pain.    Marland Kitchen lamoTRIgine (LAMICTAL) 25 MG tablet TAKE ONE TABLET BY MOUTH TWICE DAILY 180 tablet 0  . pantoprazole (PROTONIX) 40 MG tablet Take 40 mg by mouth daily.    . promethazine (PHENERGAN) 25 MG tablet Take 1 tablet (25 mg total) by mouth every 6 (six) hours as needed for nausea or vomiting. 30 tablet 1  . Rivaroxaban (XARELTO) 15 MG TABS tablet Take 15 mg by mouth daily with supper.    Marland Kitchen tiZANidine (ZANAFLEX) 2 MG tablet Take 1 tablet (2 mg total) by mouth 3 (three) times daily. 90 tablet 1  . fluconazole (DIFLUCAN) 100 MG tablet Take 1 tablet (100 mg total) by mouth daily. 11 tablet 0   No current  facility-administered medications for this visit.    Allergies  Allergen Reactions  . Adhesive [Tape] Other (See Comments)    Peels skin off (bandaids, too)  . Atorvastatin Other (See Comments)  . Tramadol Other (See Comments) and Nausea And Vomiting  . Buprenorphine Hcl Nausea And Vomiting  . Codeine Nausea And Vomiting  . Fish Oil Nausea And Vomiting  . Morphine And Related Nausea And Vomiting     Review of Systems: All systems reviewed and negative except where noted in HPI.    Physical Exam: BP 132/76   Pulse 87   Ht 5\' 10"  (1.778 m)   Wt 231 lb (104.8 kg)   BMI 33.15 kg/m  Constitutional:  Well-developed, white female in no acute distress. Psychiatric: Normal mood and affect. Behavior is normal. HEENT: Normocephalic and atraumatic. Conjunctivae are normal. No scleral icterus. Tongue and oropharynx with thick white coating present Neck supple.  Cardiovascular: Normal rate, regular rhythm.  Pulmonary/chest: Effort normal and breath sounds normal. No wheezing, rales or rhonchi. Abdominal: Soft, nondistended, nontender. Bowel sounds active throughout. There are no masses palpable. No hepatomegaly. Extremities: no edema Lymphadenopathy: No cervical adenopathy noted. Neurological: Alert and oriented to person place and time. Skin: Skin is warm and dry. No rashes noted.   ASSESSMENT AND PLAN:   70 year old female with 2 month history of solid food dysphagia. Esophageal stricture not excluded but she does have a significant amount of oral candida on exam.  Patient was recently given nystatin swish and swallow which she discontinued after 3 days due to nausea   Start Diflucan 200 mg today then 100 mg daily for a total of 10 days   Patient will call us after completion of Diflucan . If persistent dysphagia then will barium swallow with tablet .   continue PPI  Tye Savoy  09/24/2016, 5:19 PM   Cc:  Reynold Bowen, MD

## 2016-09-27 DIAGNOSIS — I480 Paroxysmal atrial fibrillation: Secondary | ICD-10-CM | POA: Diagnosis not present

## 2016-09-27 DIAGNOSIS — I2699 Other pulmonary embolism without acute cor pulmonale: Secondary | ICD-10-CM | POA: Diagnosis not present

## 2016-09-28 DIAGNOSIS — I2699 Other pulmonary embolism without acute cor pulmonale: Secondary | ICD-10-CM | POA: Diagnosis not present

## 2016-09-28 DIAGNOSIS — I480 Paroxysmal atrial fibrillation: Secondary | ICD-10-CM | POA: Diagnosis not present

## 2016-09-28 NOTE — Progress Notes (Signed)
Reviewed and agree with documentation and assessment and plan. K. Veena Nandigam , MD   

## 2016-09-30 DIAGNOSIS — I480 Paroxysmal atrial fibrillation: Secondary | ICD-10-CM | POA: Diagnosis not present

## 2016-09-30 DIAGNOSIS — I2699 Other pulmonary embolism without acute cor pulmonale: Secondary | ICD-10-CM | POA: Diagnosis not present

## 2016-10-03 ENCOUNTER — Telehealth: Payer: Self-pay | Admitting: Nurse Practitioner

## 2016-10-03 DIAGNOSIS — I2699 Other pulmonary embolism without acute cor pulmonale: Secondary | ICD-10-CM | POA: Diagnosis not present

## 2016-10-03 DIAGNOSIS — I480 Paroxysmal atrial fibrillation: Secondary | ICD-10-CM | POA: Diagnosis not present

## 2016-10-03 NOTE — Telephone Encounter (Signed)
Patient will call back if she fails to continue to improve or she acutely worsens.

## 2016-10-03 NOTE — Telephone Encounter (Signed)
She has finished the Diflucan. She did not take/use the Nystatin. She said it caused terrible nausea. She feels improved, but not up to par. Swallowing is not an issue. She has a slightly sore mouth and nausea. Discussed nutrition and hydration. She will increase broths, juices, Gatorade and soft foods. Is there anything you recommend other than more time? She said if you needed her to come back in for a visit, she would work it out.

## 2016-10-03 NOTE — Telephone Encounter (Signed)
Beth, I saw her for dysphagia which sounds better after diflucan. If swallowing is still an issue then she needs barium swallow with tablet to make sure nothing else is going on . If she is nauseated then that is different issue that I would need to know more about and would be happy to see her back for. Thanks-Sherin Murdoch

## 2016-10-04 DIAGNOSIS — M79642 Pain in left hand: Secondary | ICD-10-CM | POA: Diagnosis not present

## 2016-10-04 DIAGNOSIS — N183 Chronic kidney disease, stage 3 (moderate): Secondary | ICD-10-CM | POA: Diagnosis not present

## 2016-10-04 DIAGNOSIS — Z7901 Long term (current) use of anticoagulants: Secondary | ICD-10-CM | POA: Diagnosis not present

## 2016-10-04 DIAGNOSIS — I480 Paroxysmal atrial fibrillation: Secondary | ICD-10-CM | POA: Diagnosis not present

## 2016-10-04 DIAGNOSIS — M25522 Pain in left elbow: Secondary | ICD-10-CM | POA: Diagnosis not present

## 2016-10-04 DIAGNOSIS — M67332 Transient synovitis, left wrist: Secondary | ICD-10-CM | POA: Diagnosis not present

## 2016-10-04 DIAGNOSIS — M25532 Pain in left wrist: Secondary | ICD-10-CM | POA: Diagnosis not present

## 2016-10-04 DIAGNOSIS — I2699 Other pulmonary embolism without acute cor pulmonale: Secondary | ICD-10-CM | POA: Diagnosis not present

## 2016-10-04 DIAGNOSIS — M7712 Lateral epicondylitis, left elbow: Secondary | ICD-10-CM | POA: Diagnosis not present

## 2016-10-04 DIAGNOSIS — E559 Vitamin D deficiency, unspecified: Secondary | ICD-10-CM | POA: Diagnosis not present

## 2016-10-05 DIAGNOSIS — I480 Paroxysmal atrial fibrillation: Secondary | ICD-10-CM | POA: Diagnosis not present

## 2016-10-05 DIAGNOSIS — I2699 Other pulmonary embolism without acute cor pulmonale: Secondary | ICD-10-CM | POA: Diagnosis not present

## 2016-10-06 DIAGNOSIS — I2699 Other pulmonary embolism without acute cor pulmonale: Secondary | ICD-10-CM | POA: Diagnosis not present

## 2016-10-06 DIAGNOSIS — I480 Paroxysmal atrial fibrillation: Secondary | ICD-10-CM | POA: Diagnosis not present

## 2016-10-07 DIAGNOSIS — I2699 Other pulmonary embolism without acute cor pulmonale: Secondary | ICD-10-CM | POA: Diagnosis not present

## 2016-10-07 DIAGNOSIS — I480 Paroxysmal atrial fibrillation: Secondary | ICD-10-CM | POA: Diagnosis not present

## 2016-10-10 DIAGNOSIS — I480 Paroxysmal atrial fibrillation: Secondary | ICD-10-CM | POA: Diagnosis not present

## 2016-10-10 DIAGNOSIS — I2699 Other pulmonary embolism without acute cor pulmonale: Secondary | ICD-10-CM | POA: Diagnosis not present

## 2016-10-11 DIAGNOSIS — I2699 Other pulmonary embolism without acute cor pulmonale: Secondary | ICD-10-CM | POA: Diagnosis not present

## 2016-10-11 DIAGNOSIS — I480 Paroxysmal atrial fibrillation: Secondary | ICD-10-CM | POA: Diagnosis not present

## 2016-10-12 DIAGNOSIS — J181 Lobar pneumonia, unspecified organism: Secondary | ICD-10-CM | POA: Diagnosis not present

## 2016-10-12 DIAGNOSIS — N182 Chronic kidney disease, stage 2 (mild): Secondary | ICD-10-CM | POA: Diagnosis not present

## 2016-10-12 DIAGNOSIS — I480 Paroxysmal atrial fibrillation: Secondary | ICD-10-CM | POA: Diagnosis not present

## 2016-10-12 DIAGNOSIS — I2699 Other pulmonary embolism without acute cor pulmonale: Secondary | ICD-10-CM | POA: Diagnosis not present

## 2016-10-12 DIAGNOSIS — R079 Chest pain, unspecified: Secondary | ICD-10-CM | POA: Diagnosis not present

## 2016-10-12 DIAGNOSIS — I313 Pericardial effusion (noninflammatory): Secondary | ICD-10-CM | POA: Diagnosis not present

## 2016-10-12 DIAGNOSIS — I129 Hypertensive chronic kidney disease with stage 1 through stage 4 chronic kidney disease, or unspecified chronic kidney disease: Secondary | ICD-10-CM | POA: Diagnosis not present

## 2016-10-12 DIAGNOSIS — R0602 Shortness of breath: Secondary | ICD-10-CM | POA: Diagnosis not present

## 2016-10-12 DIAGNOSIS — J90 Pleural effusion, not elsewhere classified: Secondary | ICD-10-CM | POA: Diagnosis not present

## 2016-10-13 DIAGNOSIS — I2699 Other pulmonary embolism without acute cor pulmonale: Secondary | ICD-10-CM | POA: Diagnosis not present

## 2016-10-13 DIAGNOSIS — I480 Paroxysmal atrial fibrillation: Secondary | ICD-10-CM | POA: Diagnosis not present

## 2016-10-17 DIAGNOSIS — I2699 Other pulmonary embolism without acute cor pulmonale: Secondary | ICD-10-CM | POA: Diagnosis not present

## 2016-10-17 DIAGNOSIS — I480 Paroxysmal atrial fibrillation: Secondary | ICD-10-CM | POA: Diagnosis not present

## 2016-10-18 DIAGNOSIS — I2699 Other pulmonary embolism without acute cor pulmonale: Secondary | ICD-10-CM | POA: Diagnosis not present

## 2016-10-18 DIAGNOSIS — I480 Paroxysmal atrial fibrillation: Secondary | ICD-10-CM | POA: Diagnosis not present

## 2016-10-19 DIAGNOSIS — I480 Paroxysmal atrial fibrillation: Secondary | ICD-10-CM | POA: Diagnosis not present

## 2016-10-19 DIAGNOSIS — I2699 Other pulmonary embolism without acute cor pulmonale: Secondary | ICD-10-CM | POA: Diagnosis not present

## 2016-10-20 DIAGNOSIS — R0789 Other chest pain: Secondary | ICD-10-CM | POA: Diagnosis not present

## 2016-10-20 DIAGNOSIS — G4730 Sleep apnea, unspecified: Secondary | ICD-10-CM | POA: Diagnosis not present

## 2016-10-20 DIAGNOSIS — Z9889 Other specified postprocedural states: Secondary | ICD-10-CM | POA: Diagnosis not present

## 2016-10-20 DIAGNOSIS — M797 Fibromyalgia: Secondary | ICD-10-CM | POA: Diagnosis not present

## 2016-10-20 DIAGNOSIS — I2782 Chronic pulmonary embolism: Secondary | ICD-10-CM | POA: Diagnosis not present

## 2016-10-20 DIAGNOSIS — E782 Mixed hyperlipidemia: Secondary | ICD-10-CM | POA: Diagnosis not present

## 2016-10-20 DIAGNOSIS — I480 Paroxysmal atrial fibrillation: Secondary | ICD-10-CM | POA: Diagnosis not present

## 2016-10-20 DIAGNOSIS — N183 Chronic kidney disease, stage 3 (moderate): Secondary | ICD-10-CM | POA: Diagnosis not present

## 2016-10-20 DIAGNOSIS — Z8679 Personal history of other diseases of the circulatory system: Secondary | ICD-10-CM | POA: Diagnosis not present

## 2016-10-24 DIAGNOSIS — I480 Paroxysmal atrial fibrillation: Secondary | ICD-10-CM | POA: Diagnosis not present

## 2016-10-24 DIAGNOSIS — I2699 Other pulmonary embolism without acute cor pulmonale: Secondary | ICD-10-CM | POA: Diagnosis not present

## 2016-10-27 DIAGNOSIS — I2699 Other pulmonary embolism without acute cor pulmonale: Secondary | ICD-10-CM | POA: Diagnosis not present

## 2016-10-27 DIAGNOSIS — I480 Paroxysmal atrial fibrillation: Secondary | ICD-10-CM | POA: Diagnosis not present

## 2016-11-09 ENCOUNTER — Ambulatory Visit (INDEPENDENT_AMBULATORY_CARE_PROVIDER_SITE_OTHER): Payer: Medicare Other | Admitting: Neurology

## 2016-11-09 ENCOUNTER — Encounter: Payer: Self-pay | Admitting: Neurology

## 2016-11-09 VITALS — BP 139/83 | HR 85 | Ht 70.0 in | Wt 207.0 lb

## 2016-11-09 DIAGNOSIS — G8929 Other chronic pain: Secondary | ICD-10-CM

## 2016-11-09 DIAGNOSIS — M545 Low back pain, unspecified: Secondary | ICD-10-CM

## 2016-11-09 DIAGNOSIS — M797 Fibromyalgia: Secondary | ICD-10-CM | POA: Diagnosis not present

## 2016-11-09 DIAGNOSIS — G43009 Migraine without aura, not intractable, without status migrainosus: Secondary | ICD-10-CM | POA: Diagnosis not present

## 2016-11-09 MED ORDER — LAMOTRIGINE 25 MG PO TABS: 25.0000 mg | ORAL_TABLET | Freq: Two times a day (BID) | ORAL | 1 refills | Status: DC

## 2016-11-09 MED ORDER — TIZANIDINE HCL 2 MG PO TABS: 2.0000 mg | ORAL_TABLET | Freq: Three times a day (TID) | ORAL | 1 refills | Status: DC

## 2016-11-09 MED ORDER — GABAPENTIN 300 MG PO CAPS: ORAL_CAPSULE | ORAL | 1 refills | Status: DC

## 2016-11-09 NOTE — Progress Notes (Signed)
Reason for visit: Fibromyalgia  Debbie [name] is an 71 y.o. female  History of present illness:  Debbie [name] is a 71 year old right-handed white female with a history of fibromyalgia. The patient is on tizanidine and gabapentin for this. She has gone on some vitamin D supplementation which has also helped some for neuromuscular discomfort. The patient has had a lot of other medical issues. She was admitted to the hospital around 09/05/2016 with a pulmonary embolism. The patient also had pneumonia around that time. She has had an ablation procedure for atrial fibrillation, but she is now back in atrial fibrillation. She is having a lot of fatigue and shortness of breath with walking. She had oral candidiasis and required Diflucan. She continues to have significant nausea, she has had over 35 pounds of weight loss. She returns this office for an evaluation. She is currently walking with a walker as she feels weak and short of breath with ambulation.  Past Medical History:  Diagnosis Date  . Atrial fibrillation ([place])   . Benign essential tremor   . Breast cancer ([place])   . Chronic renal insufficiency   . Common migraine 05/07/2015  . Depression with anxiety   . Drowsiness    Excessive daytime  . Dyslipidemia   . Fibromyalgia   . History of partial seizures   . Hypertension   . Memory disturbance   . Muscle tension headache   . Obesity   . Obstructive sleep apnea   . S/P epidural steroid injection   . Seizures ([place])    Partial  . Sprain of neck 06/26/2013  . Tremors of nervous system   . Vitamin B12 deficiency     Past Surgical History:  Procedure Laterality Date  . ABDOMINAL HYSTERECTOMY    . ablation procedure     Cardiac, for Afib  . BASAL CELL CARCINOMA EXCISION    . BASAL CELL CARCINOMA EXCISION    . BRAIN MENINGIOMA EXCISION Right   . BREAST RECONSTRUCTION Bilateral   . breast reconstructive    . CHOLECYSTECTOMY    . DILATION AND CURETTAGE OF UTERUS    . dilution      . gallbladder resection    . LUMBAR SPINE SURGERY N/A   . lumbosacral    . mastectomies Bilateral   . NOSE SURGERY     Basal cell carcinoma resection from the nose  . TUBAL LIGATION N/A     Family History  Problem Relation Age of Onset  . Heart failure Mother   . Diabetes Mother   . Cancer - Colon Father   . Breast cancer Sister   . Diabetes Maternal Grandfather   . Heart failure Maternal Grandfather   . Stroke Maternal Grandmother     Social history:  reports that she has never smoked. She has never used smokeless tobacco. She reports that she does not drink alcohol or use drugs.    Allergies  Allergen Reactions  . Adhesive [Tape] Other (See Comments)    Peels skin off (bandaids, too)  . Atorvastatin Other (See Comments)  . Tramadol Other (See Comments) and Nausea And Vomiting  . Buprenorphine Hcl Nausea And Vomiting  . Codeine Nausea And Vomiting  . Fish Oil Nausea And Vomiting  . Morphine And Related Nausea And Vomiting    Medications:  Prior to Admission medications   Medication Sig Start Date End Date Taking? Authorizing Provider  acetaminophen (TYLENOL) 500 MG tablet Take 1,000 mg by mouth every 6 (six) hours as  needed.   Yes Historical Provider, MD  calcium carbonate (OS-CAL) 600 MG TABS tablet Take 600 mg by mouth daily with breakfast.   Yes Historical Provider, MD  Ergocalciferol (VITAMIN D2) 2000 units TABS Take 1 tablet by mouth daily.   Yes Historical Provider, MD  ergocalciferol (VITAMIN D2) 50000 units capsule Take 50,000 Units by mouth once a week.   Yes Historical Provider, MD  flecainide (TAMBOCOR) 50 MG tablet Take 75 mg by mouth 2 (two) times daily.   Yes Historical Provider, MD  furosemide (LASIX) 20 MG tablet Take 20 mg by mouth daily.   Yes Historical Provider, MD  gabapentin (NEURONTIN) 300 MG capsule TAKE ONE CAPSULE BY MOUTH IN THE MORNING AND AT NOON, AND TWO IN THE EVENING Patient taking differently: 300 mg 4 (four) times daily.  05/03/16  Yes  Kathrynn Ducking, MD  HYDROcodone-acetaminophen (NORCO/VICODIN) 5-325 MG tablet Take 1 tablet by mouth every 6 (six) hours as needed for moderate pain.   Yes Historical Provider, MD  lamoTRIgine (LAMICTAL) 25 MG tablet TAKE ONE TABLET BY MOUTH TWICE DAILY 05/23/16  Yes Kathrynn Ducking, MD  promethazine (PHENERGAN) 25 MG tablet Take 1 tablet (25 mg total) by mouth every 6 (six) hours as needed for nausea or vomiting. 11/12/14  Yes Kathrynn Ducking, MD  rivaroxaban (XARELTO) 20 MG TABS tablet Take 20 mg by mouth. 09/23/16  Yes Historical Provider, MD  tiZANidine (ZANAFLEX) 2 MG tablet Take 1 tablet (2 mg total) by mouth 3 (three) times daily. 05/19/15  Yes Kathrynn Ducking, MD    ROS:  Out of a complete 14 system review of symptoms, the patient complains only of the following symptoms, and all other reviewed systems are negative.  Decreased activity, decreased appetite, fatigue, weight loss Blurred vision Cold intolerance  Blood pressure 139/83, pulse 85, height 5\' 10"  (1.778 m), weight 207 lb (93.9 kg).  Physical Exam  General: The patient is alert and cooperative at the time of the examination.  Skin: No significant peripheral edema is noted.   Neurologic Exam  Mental status: The patient is alert and oriented x 3 at the time of the examination. The patient has apparent normal recent and remote memory, with an apparently normal attention span and concentration ability.   Cranial nerves: Facial symmetry is present. Speech is normal, no aphasia or dysarthria is noted. Extraocular movements are full. Visual fields are full.  Motor: The patient has good strength in all 4 extremities.  Sensory examination: Soft touch sensation is symmetric on the face, arms, and legs, with exception of some decreased soft touch sensation on the lateral aspect of her left leg below the knee.  Coordination: The patient has good finger-nose-finger and heel-to-shin bilaterally.  Gait and station: The patient  has a normal gait, the patient is able to ambulate independently. Tandem gait is normal. Romberg is negative. No drift is seen.  Reflexes: Deep tendon reflexes are symmetric.   Assessment/Plan:  1. Fibromyalgia  2. Atrial fibrillation  3. Recent pulmonary embolism  4. History of migraine  The patient has had significant new medical issues that have resulted in some disability. The patient was given a prescription for her gabapentin, tizanidine, and for the Lamictal. The patient will follow-up in about 6 months.  Jill Alexanders MD 11/09/2016 11:18 AM  [facility] [address] [place], [place] [address]  Phone [phone] Fax [phone]

## 2016-11-11 ENCOUNTER — Telehealth: Payer: Self-pay | Admitting: Nurse Practitioner

## 2016-11-11 NOTE — Telephone Encounter (Signed)
Spoke with the patient. She was seen in the fall of 2017 for a different issue. She did mention in her follow up phone call that she was still feeling nauseated. She calls today because this issue has not improved. She agrees to be re-evaluated. Appointment scheduled for 11/16/16 with Dr Silverio Decamp.

## 2016-11-16 ENCOUNTER — Telehealth: Payer: Self-pay | Admitting: *Deleted

## 2016-11-16 ENCOUNTER — Other Ambulatory Visit (INDEPENDENT_AMBULATORY_CARE_PROVIDER_SITE_OTHER): Payer: Medicare Other

## 2016-11-16 ENCOUNTER — Ambulatory Visit (INDEPENDENT_AMBULATORY_CARE_PROVIDER_SITE_OTHER): Payer: Medicare Other | Admitting: Gastroenterology

## 2016-11-16 ENCOUNTER — Encounter: Payer: Self-pay | Admitting: Gastroenterology

## 2016-11-16 VITALS — BP 122/60 | HR 100 | Ht 70.0 in | Wt 203.5 lb

## 2016-11-16 DIAGNOSIS — R627 Adult failure to thrive: Secondary | ICD-10-CM

## 2016-11-16 DIAGNOSIS — R1310 Dysphagia, unspecified: Secondary | ICD-10-CM

## 2016-11-16 DIAGNOSIS — R634 Abnormal weight loss: Secondary | ICD-10-CM

## 2016-11-16 LAB — BASIC METABOLIC PANEL
BUN: 10 mg/dL (ref 6–23)
CO2: 30 mEq/L (ref 19–32)
Calcium: 9.9 mg/dL (ref 8.4–10.5)
Chloride: 97 mEq/L (ref 96–112)
Creatinine, Ser: 0.95 mg/dL (ref 0.40–1.20)
GFR: 61.77 mL/min (ref >60.00)
Glucose, Bld: 86 mg/dL (ref 70–99)
Potassium: 3.1 mEq/L — ABNORMAL LOW (ref 3.5–5.1)
Sodium: 142 mEq/L (ref 135–145)

## 2016-11-16 LAB — ALBUMIN: Albumin: 4 g/dL (ref 3.5–5.2)

## 2016-11-16 MED ORDER — RESOURCE 2.0 PO LIQD: ORAL | 3 refills | Status: DC

## 2016-11-16 NOTE — Telephone Encounter (Signed)
Will fax letter to Dr Minna Merritts today

## 2016-11-16 NOTE — Telephone Encounter (Signed)
  11/16/2016   RE: Trinetta Freytes DOB: Mar 07, 1946 MRN: [id number]   Dear  Dr Minna Merritts,    We have scheduled the above patient for an endoscopic procedure. Our records show that she is on anticoagulation therapy.   Please advise as to how long the patient may come off her therapy of Xarelto prior to the procedure, which is scheduled for 11/22/2016  Please fax back/ or route the completed form to [facility] at [phone].   Sincerely,    Genella Mech ,CMA AAMA

## 2016-11-16 NOTE — Patient Instructions (Signed)
You have been scheduled for an endoscopy. Please follow written instructions given to you at your visit today. If you use inhalers (even only as needed), please bring them with you on the day of your procedure. Your physician has requested that you go to www.startemmi.com and enter the access code given to you at your visit today. This web site gives a general overview about your procedure. However, you should still follow specific instructions given to you by our office regarding your preparation for the procedure.  We will contact you about holding your Xarelto before your procedure  Go to the basement for labs today  Use Resource protein drinks 1-2 cans three times a day

## 2016-11-16 NOTE — Progress Notes (Signed)
Debbie [name]    [id number]    1946/03/17  Primary Care Physician:FURR,SARA, MD  Referring Physician: Cleone Slim. Rozetta Nunnery, MD [id number] [place], [place] [address]  Chief complaint:  Failure to thrive, weight loss, dysphagia  HPI: 71 year old female with history of A. fib on Xarelto, status post ablation for A. Fib, subsequent hospitalization with pneumonia and decompensation requiring short-term rehabilitation. She was last seen by Tye Savoy, NP on 09/22/2016 with complaints of dysphagia, she was found to have oral thrush and was prescribed Diflucan. Patient reports no improvement with Diflucan and continues to have progressive dysphagia worse with solids, currently able to eat only soft foods or liquids. She has lost about 43 pounds in the past 6 months. Denies any nausea or vomiting. She does have a constant globus sensation and feels like clearing her throat all the time. Also has regurgitation. No constipation or diarrhea. Never had EGD or colonoscopy. No family history of colon cancer or gastric cancer.     Outpatient Encounter Prescriptions as of 11/16/2016  Medication Sig  . acetaminophen (TYLENOL) 500 MG tablet Take 1,000 mg by mouth every 6 (six) hours as needed.  . calcium carbonate (OS-CAL) 600 MG TABS tablet Take 600 mg by mouth daily with breakfast.  . ergocalciferol (VITAMIN D2) 50000 units capsule Take 50,000 Units by mouth once a week.  . furosemide (LASIX) 20 MG tablet Take 20 mg by mouth daily.  Marland Kitchen gabapentin (NEURONTIN) 300 MG capsule TAKE ONE CAPSULE BY MOUTH IN THE MORNING AND AT NOON, AND TWO IN THE EVENING  . HYDROcodone-acetaminophen (NORCO/VICODIN) 5-325 MG tablet Take 1 tablet by mouth every 6 (six) hours as needed for moderate pain.  Marland Kitchen lamoTRIgine (LAMICTAL) 25 MG tablet Take 1 tablet (25 mg total) by mouth 2 (two) times daily.  . promethazine (PHENERGAN) 25 MG tablet Take 1 tablet (25 mg total) by mouth every 6 (six) hours as needed for  nausea or vomiting.  . rivaroxaban (XARELTO) 20 MG TABS tablet Take 20 mg by mouth.  Marland Kitchen tiZANidine (ZANAFLEX) 2 MG tablet Take 1 tablet (2 mg total) by mouth 3 (three) times daily.  . Nutritional Supplements (RESOURCE 2.0) LIQD 1-2 cans three times daily  Any flavor  . [DISCONTINUED] Ergocalciferol (VITAMIN D2) 2000 units TABS Take 1 tablet by mouth daily.  . [DISCONTINUED] flecainide (TAMBOCOR) 50 MG tablet Take 75 mg by mouth 2 (two) times daily.  . [DISCONTINUED] Nutritional Supplements (RESOURCE 2.0) LIQD 1-2 cans three times daily  Any flavor   No facility-administered encounter medications on file as of 11/16/2016.     Allergies as of 11/16/2016 - Review Complete 11/16/2016  Allergen Reaction Noted  . Adhesive [tape] Other (See Comments) 11/14/2012  . Atorvastatin Other (See Comments) 11/13/2015  . Tramadol Other (See Comments) and Nausea And Vomiting 11/13/2015  . Buprenorphine hcl Nausea And Vomiting 11/13/2015  . Codeine Nausea And Vomiting 11/14/2012  . Fish oil Nausea And Vomiting 02/04/2013  . Morphine and related Nausea And Vomiting 11/14/2012    Past Medical History:  Diagnosis Date  . Atrial fibrillation ([place])   . Benign essential tremor   . Breast cancer ([place])   . Chronic renal insufficiency   . Common migraine 05/07/2015  . Depression with anxiety   . Drowsiness    Excessive daytime  . Dyslipidemia   . Fibromyalgia   . History of partial seizures   . Hypertension   . Memory disturbance   .  Muscle tension headache   . Obesity   . Obstructive sleep apnea   . S/P epidural steroid injection   . Seizures ([place])    Partial  . Sprain of neck 06/26/2013  . Tremors of nervous system   . Vitamin B12 deficiency     Past Surgical History:  Procedure Laterality Date  . ABDOMINAL HYSTERECTOMY    . ablation procedure     Cardiac, for Afib  . BASAL CELL CARCINOMA EXCISION    . BASAL CELL CARCINOMA EXCISION    . BRAIN MENINGIOMA EXCISION Right   . BREAST  RECONSTRUCTION Bilateral   . breast reconstructive    . CHOLECYSTECTOMY    . DILATION AND CURETTAGE OF UTERUS    . dilution    . gallbladder resection    . LUMBAR SPINE SURGERY N/A   . lumbosacral    . mastectomies Bilateral   . NOSE SURGERY     Basal cell carcinoma resection from the nose  . TUBAL LIGATION N/A     Family History  Problem Relation Age of Onset  . Heart failure Mother   . Diabetes Mother   . Cancer - Colon Father   . Breast cancer Sister   . Diabetes Maternal Grandfather   . Heart failure Maternal Grandfather   . Stroke Maternal Grandmother     Social History   Social History  . Marital status: Married    Spouse name: N/A  . Number of children: 2  . Years of education: College-2   Occupational History  . RETIRED     Retired   Social History Main Topics  . Smoking status: Never Smoker  . Smokeless tobacco: Never Used  . Alcohol use No  . Drug use: No  . Sexual activity: Not on file   Other Topics Concern  . Not on file   Social History Narrative   Lives at home w/ her husband, daughter and significant other   Patient is right handed.   Patients drinks very little caffeine      Review of systems: Review of Systems  Constitutional: Negative for fever and chills. Positive for Lack of energy, unexplained weight loss HENT: Positive for sinus problems and runny nose Eyes: Negative for blurred vision.  Respiratory: Negative for cough, shortness of breath and wheezing.   Cardiovascular: Negative for chest pain and positive for palpitations/afib.  Gastrointestinal: as per HPI Genitourinary: Negative for dysuria, urgency, frequency and hematuria.  Musculoskeletal: Positive for myalgias, back pain and joint pain.  Skin: Negative for itching and rash.  Neurological: Negative for dizziness, tremors, focal weakness, seizures and loss of consciousness.  Endo/Heme/Allergies: Positive for seasonal allergies.  Psychiatric/Behavioral: Negative for  depression, suicidal ideas and hallucinations.  All other systems reviewed and are negative.   Physical Exam: Vitals:   11/16/16 0840  BP: 122/60  Pulse: 100   Body mass index is 29.2 kg/m. Gen:      No acute distress, walks with a cane HEENT:  EOMI, sclera anicteric Neck:     No masses; no thyromegaly Lungs:    Clear to auscultation bilaterally; normal respiratory effort CV:         Regular rate and rhythm; no murmurs Abd:      + bowel sounds; soft, non-tender; no palpable masses, no distension Ext:    No edema; adequate peripheral perfusion Skin:      Warm and dry; no rash Neuro: alert and oriented x 3 Psych: normal mood and affect  Data Reviewed:  Reviewed labs, radiology imaging, old records and pertinent past GI work up   Assessment and Plan/Recommendations:  71 year old female with history of A. fib on Xarelto, status post A. fib ablation complicated by DVT, pneumonia requiring prolonged hospitalization and subsequent short term rehabilitation here with complaints of progressive dysphagia to solids Patient did not have any significant improvement after treatment of oral candidiasis with Diflucan We will proceed with EGD to evaluate solid dysphagia Differential includes malignancy versus peptic stricture versus esophagitis The risks and benefits as well as alternatives of endoscopic procedure(s) have been discussed and reviewed. All questions answered. The patient agrees to proceed. We'll request cardiology/prescribing M.D. to weigh in if okay to hold Xarelto for 2 days prior to the procedure to decrease potential bleeding complication We will obtain CBC, CMET, albumin and prealbumin Advised patient to drink resource protein drink 1-2 cans 3 times daily Greater than 50% of the time used for counseling as well as treatment plan and follow-up. She had multiple questions which were answered to her satisfaction  Damaris Hippo , MD [phone] Mon-Fri 8a-5p [phone] after  5p, weekends, holidays  CC: Karleen Hampshire., MD

## 2016-11-17 ENCOUNTER — Other Ambulatory Visit: Payer: Self-pay

## 2016-11-17 ENCOUNTER — Telehealth: Payer: Self-pay | Admitting: Pulmonary Disease

## 2016-11-17 DIAGNOSIS — E876 Hypokalemia: Secondary | ICD-10-CM

## 2016-11-17 LAB — PREALBUMIN: Prealbumin: 23 mg/dL (ref 17–34)

## 2016-11-17 MED ORDER — POTASSIUM CHLORIDE ER 20 MEQ PO TBCR: 20.0000 meq | EXTENDED_RELEASE_TABLET | Freq: Every day | ORAL | 0 refills | Status: DC

## 2016-11-17 NOTE — Telephone Encounter (Signed)
IMAGING CTA CHEST 10/12/16 (per radiologist): No acute evidence for pulmonary embolism. Mild cardiomegaly noted. No mediastinal or hilar adenopathy. Moderate left pleural effusion that appears free-flowing. Persistent partial atelectasis left lower lobe and a portion of the lingula. No suspicious lung nodule.  CT CHEST W/ 09/12/16 (per radiologist): Arch pericardial effusion noted, however this reportedly had decreased in size. No pathologically enlarged mediastinal or hilar lymph nodes. No axillary lymphadenopathy. Esophagus normal. Compressive atelectasis left lower lobe. Slight increase in airspace consolidation consistent with atelectasis in the right lower lobe with small right pleural effusion. Mild bronchiectasis to the right lower lobe.  VENOUS DUPLEX LUE 09/07/16 (per radiologist): No evidence of DVT.  BILATERAL LE VENOUS DUPLEX 09/06/16 (per radiologist): No evidence of DVT in either extremity.  CTA CHEST 09/05/16 (per radiologist): Filling defects within distal right upper lobe pulmonary artery with extension of thrombus into segmental and subsegmental branches of the right upper lobe. Additional small nonexclusive thrombus within segmental branches of right middle lobe pulmonary arteries. RV/LV ratio 0.7. Large pericardial effusion. No pathologically enlarged mediastinal or hilar lymph nodes. No axillary lymphadenopathy. Small to moderate left pleural effusion. Streaky airspace disease within the lingula and left lower lobe. Streaky atelectasis in the right lower lobe.  PORT CXR 08/17/16 (per radiologist): Rotated to the left. Probable loculated left pleural effusion laterally. No pneumothorax. Low lung volumes. Worsened left basilar aeration with patchy airspace disease.  CARDIAC LIMITED TTE (09/13/16): Very limited study. Small pericardial effusion. LV not well visualized but overall grossly normal in size and function. Effusion noted to appear the same as on previous echo from  09/06/16.  LIMITED TTE (09/06/16): Mild to moderate pericardial effusion with fibrinous material noted within pericardial space. Normal right atrium. Normal right ventricular structure and function. EF 65%.  LIMITED TTE (08/18/16): Study for pericardial effusion. Small localized effusion near right ventricle.  TEE (08/15/16): LV normal in size and function with EF 55%. Mild dilation of left atrium & normal right atrium. RV normal in size and function. No aortic stenosis or regurgitation. Moderate mitral regurgitation. Pulmonic valve not well visualized but with trace pulmonic regurgitation. No significant tricuspid regurgitation. No evidence of pericardial effusion.

## 2016-11-18 ENCOUNTER — Ambulatory Visit (INDEPENDENT_AMBULATORY_CARE_PROVIDER_SITE_OTHER): Payer: Medicare Other | Admitting: Pulmonary Disease

## 2016-11-18 ENCOUNTER — Ambulatory Visit (INDEPENDENT_AMBULATORY_CARE_PROVIDER_SITE_OTHER)
Admission: RE | Admit: 2016-11-18 | Discharge: 2016-11-18 | Disposition: A | Discharge status: Home / Self Care | Payer: Medicare Other | Source: Ambulatory Visit | Attending: Pulmonary Disease | Admitting: Pulmonary Disease

## 2016-11-18 ENCOUNTER — Telehealth: Payer: Self-pay | Admitting: Gastroenterology

## 2016-11-18 ENCOUNTER — Encounter: Payer: Self-pay | Admitting: Pulmonary Disease

## 2016-11-18 DIAGNOSIS — N189 Chronic kidney disease, unspecified: Secondary | ICD-10-CM

## 2016-11-18 DIAGNOSIS — J189 Pneumonia, unspecified organism: Secondary | ICD-10-CM | POA: Diagnosis not present

## 2016-11-18 DIAGNOSIS — I2699 Other pulmonary embolism without acute cor pulmonale: Secondary | ICD-10-CM

## 2016-11-18 DIAGNOSIS — R0602 Shortness of breath: Secondary | ICD-10-CM

## 2016-11-18 DIAGNOSIS — J90 Pleural effusion, not elsewhere classified: Secondary | ICD-10-CM | POA: Insufficient documentation

## 2016-11-18 DIAGNOSIS — R0600 Dyspnea, unspecified: Secondary | ICD-10-CM | POA: Insufficient documentation

## 2016-11-18 NOTE — Telephone Encounter (Signed)
Patient called back and said she wanted to stop it on Sunday for another procedure she is having on Monday. I told her we Can Not advise on her to hold it longer than 24 hours before I told her to contact pulmonary who she is having another procedure with on Monday to have them advise about her Xarelto for their procedure   Again Stressed to her to hold Xarelto on Monday for Tuesdays procedure with Dr Silverio Decamp. Can not advise on separate procedure from another doctor

## 2016-11-18 NOTE — Telephone Encounter (Signed)
Called patient to inform for her to hold Xarelto 1 day prior to procedure scheduled on Tuesday.  Fax sent to [place] in the office stating for patient to hold only 1 day... Informed pt
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
## 2016-11-18 NOTE — Telephone Encounter (Signed)
Its ok to proceed with EGD with holding Xarelto for 24 hours

## 2016-11-18 NOTE — Telephone Encounter (Signed)
I haven't received anything on her. She is on Xarelto.

## 2016-11-18 NOTE — Telephone Encounter (Signed)
Dr Silverio Decamp received ok for pt to  Hold Xarelto 1 day prior to procedure scheduled on Tuesday... Patient seems to think this is not long enough  Is this sufficient for her procedure

## 2016-11-18 NOTE — Patient Instructions (Addendum)
   Call me if you have any new breathing problems or questions before your next appointment.  We will try to get you set up for a thoracentesis to drain the fluid from around your left lung while you are off your blood thinner.  We will have you do a breathing and walking test at your next appointment.  TESTS ORDERED: 1. Full PFTs at follow-up appointment 2. 6MWT on room air at follow-up appointment 3. CXR PA/LAT today

## 2016-11-18 NOTE — Telephone Encounter (Signed)
Returned patients call, we faxed Xarelto letter on the 10th.......  Have not heard back from Dr Minna Debbie [name] yet... Calling there office now    They will fax the approval over today , Patient needs to know today

## 2016-11-21 ENCOUNTER — Ambulatory Visit (HOSPITAL_COMMUNITY)
Admission: RE | Admit: 2016-11-21 | Discharge: 2016-11-21 | Disposition: A | Discharge status: Home / Self Care | Payer: Medicare Other | Source: Ambulatory Visit | Attending: Pulmonary Disease | Admitting: Pulmonary Disease

## 2016-11-21 ENCOUNTER — Ambulatory Visit (HOSPITAL_COMMUNITY)
Admission: RE | Admit: 2016-11-21 | Discharge: 2016-11-21 | Disposition: A | Discharge status: Home / Self Care | Payer: Medicare Other | Source: Ambulatory Visit | Attending: Radiology | Admitting: Radiology

## 2016-11-21 DIAGNOSIS — R846 Abnormal cytological findings in specimens from respiratory organs and thorax: Secondary | ICD-10-CM | POA: Diagnosis not present

## 2016-11-21 DIAGNOSIS — Z9889 Other specified postprocedural states: Secondary | ICD-10-CM

## 2016-11-21 DIAGNOSIS — J90 Pleural effusion, not elsewhere classified: Secondary | ICD-10-CM | POA: Diagnosis not present

## 2016-11-21 LAB — BODY FLUID CELL COUNT WITH DIFFERENTIAL
Lymphs, Fluid: 30 %
Monocyte-Macrophage-Serous Fluid: 64 % (ref 50–90)
Neutrophil Count, Fluid: 6 % (ref 0–25)
Total Nucleated Cell Count, Fluid: 744 cu mm (ref 0–1000)

## 2016-11-21 LAB — GLUCOSE, SEROUS FLUID: Glucose, Fluid: 92 mg/dL

## 2016-11-21 LAB — LACTATE DEHYDROGENASE, PLEURAL OR PERITONEAL FLUID: LD, Fluid: 91 U/L — ABNORMAL HIGH (ref 3–23)

## 2016-11-21 NOTE — Procedures (Signed)
PROCEDURE SUMMARY:  Successful US guided diagnostic and therapeutic left thoracentesis. Yielded 230 mL of blood-tinged, clear fluid. Pt tolerated procedure well. No immediate complications.  Specimen was sent for labs. CXR ordered.  Docia Barrier PA-C 11/21/2016 1:09 PM

## 2016-11-22 ENCOUNTER — Encounter: Payer: Self-pay | Admitting: Gastroenterology

## 2016-11-22 ENCOUNTER — Ambulatory Visit (AMBULATORY_SURGERY_CENTER): Payer: Medicare Other | Admitting: Gastroenterology

## 2016-11-22 VITALS — BP 123/54 | HR 65 | Temp 96.8°F | Resp 16 | Ht 70.0 in | Wt 203.0 lb

## 2016-11-22 DIAGNOSIS — R1310 Dysphagia, unspecified: Secondary | ICD-10-CM

## 2016-11-22 DIAGNOSIS — I4891 Unspecified atrial fibrillation: Secondary | ICD-10-CM | POA: Diagnosis not present

## 2016-11-22 DIAGNOSIS — R634 Abnormal weight loss: Secondary | ICD-10-CM

## 2016-11-22 DIAGNOSIS — G4733 Obstructive sleep apnea (adult) (pediatric): Secondary | ICD-10-CM | POA: Diagnosis not present

## 2016-11-22 LAB — GRAM STAIN

## 2016-11-22 MED ORDER — SODIUM CHLORIDE 0.9 % IV SOLN: 500.0000 mL | INTRAVENOUS | Status: DC

## 2016-11-22 NOTE — Patient Instructions (Signed)
YOU HAD AN ENDOSCOPIC PROCEDURE TODAY AT THE [facility]:   Refer to the procedure report that was given to you for any specific questions about what was found during the examination.  If the procedure report does not answer your questions, please call your gastroenterologist to clarify.  If you requested that your care partner not be given the details of your procedure findings, then the procedure report has been included in a sealed envelope for you to review at your convenience later.  YOU SHOULD EXPECT: Some feelings of bloating in the abdomen. Passage of more gas than usual.  Walking can help get rid of the air that was put into your GI tract during the procedure and reduce the bloating. If you had a lower endoscopy (such as a colonoscopy or flexible sigmoidoscopy) you may notice spotting of blood in your stool or on the toilet paper. If you underwent a bowel prep for your procedure, you may not have a normal bowel movement for a few days.  Please Note:  You might notice some irritation and congestion in your nose or some drainage.  This is from the oxygen used during your procedure.  There is no need for concern and it should clear up in a day or so.  SYMPTOMS TO REPORT IMMEDIATELY:    Following upper endoscopy (EGD)  Vomiting of blood or coffee ground material  New chest pain or pain under the shoulder blades  Painful or persistently difficult swallowing  New shortness of breath  Fever of 100F or higher  Black, tarry-looking stools  For urgent or emergent issues, a gastroenterologist can be reached at any hour by calling [phone].    DIET:  We do recommend a small meal at first, but then you may proceed to your regular diet.  Drink plenty of fluids but you should avoid alcoholic beverages for 24 hours.  ACTIVITY:  You should plan to take it easy for the rest of today and you should NOT DRIVE or use heavy machinery until tomorrow (because of the sedation medicines  used during the test).    FOLLOW UP: Our staff will call the number listed on your records the next business day following your procedure to check on you and address any questions or concerns that you may have regarding the information given to you following your procedure. If we do not reach you, we will leave a message.  However, if you are feeling well and you are not experiencing any problems, there is no need to return our call.  We will assume that you have returned to your regular daily activities without incident.  If any biopsies were taken you will be contacted by phone or by letter within the next 1-3 weeks.  Please call us at [phone] if you have not heard about the biopsies in 3 weeks.    SIGNATURES/CONFIDENTIALITY: You and/or your care partner have signed paperwork which will be entered into your electronic medical record.  These signatures attest to the fact that that the information above on your After Visit Summary has been reviewed and is understood.  Full responsibility of the confidentiality of this discharge information lies with you and/or your care-partner.  Thank you for letting us take care of your healthcare needs today. 

## 2016-11-22 NOTE — Op Note (Addendum)
[place] Patient Name: Debbie [name] Procedure Date: 11/22/2016 10:28 AM MRN: [id number] Endoscopist: Mauri Pole , MD Age: 71 Referring MD:  Date of Birth: February 27, 1946 Gender: Female Account #: [id number] Procedure:                Upper GI endoscopy Indications:              Dysphagia, Weight loss Medicines:                Monitored Anesthesia Care Procedure:                Pre-Anesthesia Assessment:                           - Prior to the procedure, a History and Physical                            was performed, and patient medications and                            allergies were reviewed. The patient's tolerance of                            previous anesthesia was also reviewed. The risks                            and benefits of the procedure and the sedation                            options and risks were discussed with the patient.                            All questions were answered, and informed consent                            was obtained. Prior Anticoagulants: The patient has                            taken no previous anticoagulant or antiplatelet                            agents. ASA Grade Assessment: III - A patient with                            severe systemic disease. After reviewing the risks                            and benefits, the patient was deemed in                            satisfactory condition to undergo the procedure.                           After obtaining informed consent, the endoscope was  passed under direct vision. Throughout the                            procedure, the patient's blood pressure, pulse, and                            oxygen saturations were monitored continuously. The                            Model GIF-[id number]) scope was introduced                            through the mouth, and advanced to the second part                            of duodenum. The  upper GI endoscopy was                            accomplished without difficulty. The patient                            tolerated the procedure well. Scope In: Scope Out: Findings:                 The lumen of the upper third of the esophagus and                            middle third of the esophagus was mildly dilated                            with retained secretions. Regular Z-line with no                            evidence of stricture                           The cardia and gastric fundus were normal on                            retroflexion.                           The entire examined stomach was normal otherwise.                           The first portion of the duodenum and second                            portion of the duodenum were normal. Complications:            No immediate complications. Estimated Blood Loss:     Estimated blood loss: none. Impression:               - Dilation in the upper third of the esophagus and  in the middle third of the esophagus with retained                            secretions suggestive of decreased esophageal                            motility.                           - Normal stomach.                           - Normal first portion of the duodenum and second                            portion of the duodenum.                           - No specimens collected. Recommendation:           - Patient has a contact number available for                            emergencies. The signs and symptoms of potential                            delayed complications were discussed with the                            patient. Return to normal activities tomorrow.                            Written discharge instructions were provided to the                            patient.                           - Resume previous diet.                           - Continue present medications.                           -  Resume Xarelto (rivaroxaban) at prior dose today.                            Refer to managing physician for further adjustment                            of therapy.                           - Follow an antireflux regimen.                           - Perform routine esophageal manometry at the next  available appointment to further evaluate dysphagia                            and esophageal motility. Mauri Pole, MD 11/22/2016 10:43:13 AM This report has been signed electronically.

## 2016-11-22 NOTE — Progress Notes (Signed)
Report given to PACU RN, vss 

## 2016-11-23 ENCOUNTER — Ambulatory Visit: Payer: Medicare Other | Admitting: Family Medicine

## 2016-11-23 ENCOUNTER — Telehealth: Payer: Self-pay

## 2016-11-23 NOTE — Telephone Encounter (Signed)
  Follow up Call-  Call back number 11/22/2016  Post procedure Call Back phone  # [phone]  Permission to leave phone message Yes  Some recent data might be hidden     Patient questions:  Do you have a fever, pain , or abdominal swelling? No. Pain Score  0 *  Have you tolerated food without any problems? Yes.    Have you been able to return to your normal activities? Yes.    Do you have any questions about your discharge instructions: Diet   No. Medications  No. Follow up visit  No.  Do you have questions or concerns about your Care? No.  Actions: * If pain score is 4 or above: No action needed, pain <4.

## 2016-11-25 ENCOUNTER — Other Ambulatory Visit: Payer: Self-pay

## 2016-11-26 LAB — CULTURE, BODY FLUID W GRAM STAIN -BOTTLE: Culture: NO GROWTH

## 2016-11-29 ENCOUNTER — Encounter: Payer: Self-pay | Admitting: Family Medicine

## 2016-11-29 ENCOUNTER — Ambulatory Visit (INDEPENDENT_AMBULATORY_CARE_PROVIDER_SITE_OTHER): Payer: Medicare Other | Admitting: Family Medicine

## 2016-11-29 VITALS — BP 120/78 | HR 114 | Resp 12 | Ht 70.0 in | Wt 205.1 lb

## 2016-11-29 DIAGNOSIS — R296 Repeated falls: Secondary | ICD-10-CM

## 2016-11-29 DIAGNOSIS — E559 Vitamin D deficiency, unspecified: Secondary | ICD-10-CM | POA: Diagnosis not present

## 2016-11-29 DIAGNOSIS — M797 Fibromyalgia: Secondary | ICD-10-CM

## 2016-11-29 DIAGNOSIS — K5900 Constipation, unspecified: Secondary | ICD-10-CM | POA: Diagnosis not present

## 2016-11-29 DIAGNOSIS — B370 Candidal stomatitis: Secondary | ICD-10-CM

## 2016-11-29 DIAGNOSIS — I4891 Unspecified atrial fibrillation: Secondary | ICD-10-CM

## 2016-11-29 DIAGNOSIS — M79662 Pain in left lower leg: Secondary | ICD-10-CM

## 2016-11-29 LAB — BASIC METABOLIC PANEL
BUN: 12 mg/dL (ref 6–23)
CO2: 30 mEq/L (ref 19–32)
Calcium: 9.2 mg/dL (ref 8.4–10.5)
Chloride: 101 mEq/L (ref 96–112)
Creatinine, Ser: 0.82 mg/dL (ref 0.40–1.20)
GFR: 73.2 mL/min (ref >60.00)
Glucose, Bld: 103 mg/dL — ABNORMAL HIGH (ref 70–99)
Potassium: 4.1 mEq/L (ref 3.5–5.1)
Sodium: 138 mEq/L (ref 135–145)

## 2016-11-29 LAB — CBC
HCT: 37.5 % (ref 36.0–46.0)
Hemoglobin: 12.2 g/dL (ref 12.0–15.0)
MCHC: 32.6 g/dL (ref 30.0–36.0)
MCV: 84.1 fl (ref 78.0–100.0)
Platelets: 202 10*3/uL (ref 150.0–400.0)
RBC: 4.46 Mil/uL (ref 3.87–5.11)
RDW: 19.9 % — ABNORMAL HIGH (ref 11.5–15.5)
WBC: 6.8 10*3/uL (ref 4.0–10.5)

## 2016-11-29 LAB — VITAMIN D 25 HYDROXY (VIT D DEFICIENCY, FRACTURES): VITD: 53.96 ng/mL (ref 30.00–100.00)

## 2016-11-29 MED ORDER — DILTIAZEM HCL ER COATED BEADS 120 MG PO CP24: 120.0000 mg | ORAL_CAPSULE | Freq: Every day | ORAL | 1 refills | Status: DC

## 2016-11-29 MED ORDER — NYSTATIN 100000 UNIT/ML MT SUSP: 2.0000 mL | Freq: Four times a day (QID) | OROMUCOSAL | 1 refills | Status: DC

## 2016-11-29 MED ORDER — ERGOCALCIFEROL 1.25 MG (50000 UT) PO CAPS: 50000.0000 [IU] | ORAL_CAPSULE | ORAL | 1 refills | Status: DC

## 2016-11-29 NOTE — Progress Notes (Signed)
Pre visit review using our clinic review tool, if applicable. No additional management support is needed unless otherwise documented below in the visit note. 

## 2016-11-29 NOTE — Patient Instructions (Addendum)
A few things to remember from today's visit:   Atrial fibrillation, unspecified type ([place]) - Plan: Ambulatory referral to Cardiology, Basic metabolic panel, CBC, diltiazem (CARDIZEM CD) 120 MG 24 hr capsule  Fibromyalgia - Plan: CBC  Vitamin D deficiency - Plan: VITAMIN D 25 Hydroxy (Vit-D Deficiency, Fractures)  Constipation, unspecified constipation type  Continue monitoring heart rate.  Miralax every 1-2 days. Fall precautions.   Please be sure medication list is accurate. If a new problem present, please set up appointment sooner than planned today.
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
## 2016-11-30 ENCOUNTER — Telehealth: Payer: Self-pay | Admitting: Gastroenterology

## 2016-11-30 ENCOUNTER — Other Ambulatory Visit: Payer: Self-pay

## 2016-11-30 DIAGNOSIS — R1310 Dysphagia, unspecified: Secondary | ICD-10-CM

## 2016-11-30 DIAGNOSIS — K219 Gastro-esophageal reflux disease without esophagitis: Secondary | ICD-10-CM

## 2016-11-30 NOTE — Progress Notes (Signed)
Scheduled her Esophageal Manometry for 12/14/16 at 10:30. Instructions mailed. Potassium is being followed by her PCP.

## 2016-12-05 ENCOUNTER — Telehealth: Payer: Self-pay | Admitting: Family Medicine

## 2016-12-05 ENCOUNTER — Telehealth: Payer: Self-pay | Admitting: Gastroenterology

## 2016-12-05 NOTE — Telephone Encounter (Signed)
I prefer topical treatment , I believed she took oral treatment recently . Given her multiple medical problems, I prefer not to add oral medication for now. She can rub medication with fingers 2 times daily on her tongue, where lesions area. They are very small and mainly on anterior aspect of her tongue.  Thanks, BJ

## 2016-12-05 NOTE — Telephone Encounter (Signed)
Pt was given the instructions for mano.  All questions answered pt was advised that she does not need to hold xarelto prior to procedure.  Confirmed with Dr Silverio Decamp

## 2016-12-05 NOTE — Telephone Encounter (Signed)
Pt stated that the nystatin (MYCOSTATIN) 100000 UNIT/ML suspension is making her nauseous and is wondering if she can take tablets instead.

## 2016-12-06 NOTE — Telephone Encounter (Signed)
Called and spoke with patient. Relayed message below. Told patient just to use a little amount and rub it onto the lesions twice a day, and to see if that helps. Patient verbalized understanding.

## 2016-12-08 ENCOUNTER — Ambulatory Visit: Payer: Medicare Other | Admitting: Physician Assistant

## 2016-12-09 ENCOUNTER — Ambulatory Visit (INDEPENDENT_AMBULATORY_CARE_PROVIDER_SITE_OTHER): Payer: Medicare Other | Admitting: Family Medicine

## 2016-12-09 ENCOUNTER — Encounter: Payer: Self-pay | Admitting: Family Medicine

## 2016-12-09 VITALS — BP 118/80 | HR 86 | Resp 12 | Ht 70.0 in | Wt 205.0 lb

## 2016-12-09 DIAGNOSIS — R079 Chest pain, unspecified: Secondary | ICD-10-CM | POA: Diagnosis not present

## 2016-12-09 DIAGNOSIS — R0602 Shortness of breath: Secondary | ICD-10-CM

## 2016-12-09 DIAGNOSIS — M159 Polyosteoarthritis, unspecified: Secondary | ICD-10-CM | POA: Insufficient documentation

## 2016-12-09 DIAGNOSIS — Z23 Encounter for immunization: Secondary | ICD-10-CM

## 2016-12-09 DIAGNOSIS — M797 Fibromyalgia: Secondary | ICD-10-CM | POA: Diagnosis not present

## 2016-12-09 DIAGNOSIS — I480 Paroxysmal atrial fibrillation: Secondary | ICD-10-CM

## 2016-12-09 MED ORDER — DULOXETINE HCL 20 MG PO CPEP: 20.0000 mg | ORAL_CAPSULE | Freq: Every day | ORAL | 1 refills | Status: DC

## 2016-12-09 MED ORDER — HYDROCODONE-ACETAMINOPHEN 5-325 MG PO TABS: 1.0000 | ORAL_TABLET | Freq: Two times a day (BID) | ORAL | 0 refills | Status: DC | PRN

## 2016-12-09 NOTE — Patient Instructions (Signed)
A few things to remember from today's visit:   Generalized osteoarthritis of multiple sites - Plan: DULoxetine (CYMBALTA) 20 MG capsule, HYDROcodone-acetaminophen (NORCO/VICODIN) 5-325 MG tablet  Paroxysmal atrial fibrillation ([facility])  Fibromyalgia - Plan: DULoxetine (CYMBALTA) 20 MG capsule, HYDROcodone-acetaminophen (NORCO/VICODIN) 5-325 MG tablet  Cymbalta 20 mg added. Since you do not take Hydrocodone frequently I can continue filling it. Knee seems to be aging arthritis. Recommend avoiding Phenergan, Zofran os an options if nausea re-occurs.  No changes in rest.  Keep appts with pulmonologist,gastro,and cardio.  Please be sure medication list is accurate. If a new problem present, please set up appointment sooner than planned today.

## 2016-12-09 NOTE — Progress Notes (Signed)
Pre visit review using our clinic review tool, if applicable. No additional management support is needed unless otherwise documented below in the visit note. 
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
## 2016-12-14 ENCOUNTER — Ambulatory Visit (HOSPITAL_COMMUNITY)
Admission: RE | Admit: 2016-12-14 | Discharge: 2016-12-14 | Disposition: A | Discharge status: Home / Self Care | Payer: Medicare Other | Source: Ambulatory Visit | Attending: Gastroenterology | Admitting: Gastroenterology

## 2016-12-14 ENCOUNTER — Encounter (HOSPITAL_COMMUNITY)
Admission: RE | Disposition: A | Discharge status: Home / Self Care | Payer: Self-pay | Source: Ambulatory Visit | Attending: Gastroenterology

## 2016-12-14 DIAGNOSIS — R1310 Dysphagia, unspecified: Secondary | ICD-10-CM | POA: Diagnosis not present

## 2016-12-14 DIAGNOSIS — K219 Gastro-esophageal reflux disease without esophagitis: Secondary | ICD-10-CM | POA: Diagnosis not present

## 2016-12-14 HISTORY — PX: ESOPHAGEAL MANOMETRY: SHX5429

## 2016-12-14 SURGERY — MANOMETRY, ESOPHAGUS
Anesthesia: Choice

## 2016-12-14 MED ORDER — LIDOCAINE VISCOUS 2 % MT SOLN
OROMUCOSAL | Status: AC
  Filled 2016-12-14: qty 15

## 2016-12-14 SURGICAL SUPPLY — 2 items
FACESHIELD LNG OPTICON STERILE (SAFETY) IMPLANT
GLOVE BIO SURGEON STRL SZ8 (GLOVE) ×4 IMPLANT

## 2016-12-14 NOTE — Progress Notes (Signed)
Esophageal manometry done per protocol.  Pt. Tolerated procedure well, without complication.  Dr. Silverio Decamp to be notified today.   Laverta Baltimore, RN

## 2016-12-15 ENCOUNTER — Ambulatory Visit (INDEPENDENT_AMBULATORY_CARE_PROVIDER_SITE_OTHER): Payer: Medicare Other | Admitting: Pulmonary Disease

## 2016-12-15 ENCOUNTER — Encounter (HOSPITAL_COMMUNITY): Payer: Self-pay | Admitting: Gastroenterology

## 2016-12-15 DIAGNOSIS — R0602 Shortness of breath: Secondary | ICD-10-CM | POA: Diagnosis not present

## 2016-12-15 LAB — PULMONARY FUNCTION TEST
DL/VA % pred: 81 %
DL/VA: 4.29 ml/min/mmHg/L
DLCO cor % pred: 61 %
DLCO cor: 18.13 ml/min/mmHg
DLCO unc % pred: 63 %
DLCO unc: 18.82 ml/min/mmHg
FEF 25-75 Post: 1.08 L/sec
FEF 25-75 Pre: 1.34 L/sec
FEF2575-%Change-Post: -19 %
FEF2575-%Pred-Post: 51 %
FEF2575-%Pred-Pre: 63 %
FEV1-%Change-Post: -3 %
FEV1-%Pred-Post: 66 %
FEV1-%Pred-Pre: 68 %
FEV1-Post: 1.75 L
FEV1-Pre: 1.82 L
FEV1FVC-%Change-Post: 1 %
FEV1FVC-%Pred-Pre: 96 %
FEV6-%Change-Post: -4 %
FEV6-%Pred-Post: 70 %
FEV6-%Pred-Pre: 73 %
FEV6-Post: 2.35 L
FEV6-Pre: 2.46 L
FEV6FVC-%Change-Post: 1 %
FEV6FVC-%Pred-Post: 104 %
FEV6FVC-%Pred-Pre: 102 %
FVC-%Change-Post: -5 %
FVC-%Pred-Post: 67 %
FVC-%Pred-Pre: 71 %
FVC-Post: 2.36 L
FVC-Pre: 2.49 L
Post FEV1/FVC ratio: 74 %
Post FEV6/FVC ratio: 100 %
Pre FEV1/FVC ratio: 73 %
Pre FEV6/FVC Ratio: 99 %

## 2016-12-16 ENCOUNTER — Ambulatory Visit (INDEPENDENT_AMBULATORY_CARE_PROVIDER_SITE_OTHER): Payer: Medicare Other | Admitting: Pulmonary Disease

## 2016-12-16 ENCOUNTER — Encounter: Payer: Self-pay | Admitting: Pulmonary Disease

## 2016-12-16 ENCOUNTER — Telehealth: Payer: Self-pay | Admitting: Pulmonary Disease

## 2016-12-16 VITALS — BP 92/70 | HR 80 | Ht 70.0 in | Wt 202.0 lb

## 2016-12-16 DIAGNOSIS — I2690 Septic pulmonary embolism without acute cor pulmonale: Secondary | ICD-10-CM

## 2016-12-16 DIAGNOSIS — J90 Pleural effusion, not elsewhere classified: Secondary | ICD-10-CM | POA: Diagnosis not present

## 2016-12-16 DIAGNOSIS — J984 Other disorders of lung: Secondary | ICD-10-CM | POA: Diagnosis not present

## 2016-12-16 DIAGNOSIS — R0602 Shortness of breath: Secondary | ICD-10-CM
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
## 2016-12-16 NOTE — Telephone Encounter (Signed)
PFT 12/15/16: FVC 2.49 L (71%) FEV1 1.18 L (68%) FEV1/FVC 0.73 FEF 25-75 1.34 L (63%) negative bronchodilator response TLC 3.77 L (66%) RV 62% ERV 37% DLCO corrected 61%  IMAGING PORT CXR 11/21/16 (personally reviewed by me): Performed post thoracentesis. Continue blunting of left costophrenic angle suggestive of atelectasis versus minimal residual pleural fluid. No parenchymal opacity or mass appreciated otherwise. Mediastinum normal in contour.  LEFT PLEURAL FLUID ANALYSIS (11/21/16) Volume: 230 mL of blood-tinged fluid Glucose: 92 LDH: 91 WBC: 744 (lymphocytes 30%, neutrophils 6%, monocytes 64%) Culture: Negative Cytology: Reactive mesothelial cells. Malignancy negative.

## 2016-12-16 NOTE — Progress Notes (Signed)
Test reviewed.  

## 2016-12-16 NOTE — Patient Instructions (Signed)
   Keep close tabs on your weight.  Call me if you have any new breathing problems or questions before your next appointment.  I will see you back in 6 months or sooner if needed.

## 2016-12-22 ENCOUNTER — Encounter (INDEPENDENT_AMBULATORY_CARE_PROVIDER_SITE_OTHER): Payer: Self-pay

## 2016-12-22 ENCOUNTER — Encounter: Payer: Self-pay | Admitting: Internal Medicine

## 2016-12-22 ENCOUNTER — Ambulatory Visit (INDEPENDENT_AMBULATORY_CARE_PROVIDER_SITE_OTHER): Payer: Medicare Other | Admitting: Internal Medicine

## 2016-12-22 VITALS — BP 98/70 | HR 69 | Ht 70.0 in | Wt 208.0 lb

## 2016-12-22 DIAGNOSIS — I480 Paroxysmal atrial fibrillation: Secondary | ICD-10-CM | POA: Diagnosis not present

## 2016-12-22 DIAGNOSIS — R0602 Shortness of breath: Secondary | ICD-10-CM

## 2016-12-22 DIAGNOSIS — R002 Palpitations: Secondary | ICD-10-CM | POA: Diagnosis not present

## 2016-12-22 DIAGNOSIS — R079 Chest pain, unspecified: Secondary | ICD-10-CM

## 2016-12-22 MED ORDER — RIVAROXABAN 20 MG PO TABS: 20.0000 mg | ORAL_TABLET | Freq: Every day | ORAL | 6 refills | Status: DC

## 2016-12-22 NOTE — Patient Instructions (Signed)
Medication Instructions:  Your physician recommends that you continue on your current medications as directed. Please refer to the Current Medication list given to you today.   Labwork: none  Testing/Procedures: Your physician has requested that you have an echocardiogram. Echocardiography is a painless test that uses sound waves to create images of your heart. It provides your doctor with information about the size and shape of your heart and how well your heart's chambers and valves are working. This procedure takes approximately one hour. There are no restrictions for this procedure.  Your physician has requested that you have a lexiscan myoview. For further information please visit [email or web address]. Please follow instruction sheet, as given.  Your physician has recommended that you wear an event monitor. Event monitors are medical devices that record the heart's electrical activity. Doctors most often [place] these monitors to diagnose arrhythmias. Arrhythmias are problems with the speed or rhythm of the heartbeat. The monitor is a small, portable device. You can wear one while you do your normal daily activities. This is usually used to diagnose what is causing palpitations/syncope (passing out).  14 DAY  Please try to schedule the same day of other testing after other testing has been completed   Follow-Up: Your physician recommends that you schedule a follow-up appointment in: 1 month with Dr End.    Any Other Special Instructions Will Be Listed Below (If Applicable).     If you need a refill on your cardiac medications before your next appointment, please call your pharmacy.
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
## 2016-12-26 ENCOUNTER — Telehealth: Payer: Self-pay | Admitting: Gastroenterology

## 2016-12-26 NOTE — Telephone Encounter (Signed)
Advised patient her results are not back yet.

## 2016-12-27 ENCOUNTER — Telehealth (HOSPITAL_COMMUNITY): Payer: Self-pay | Admitting: *Deleted

## 2016-12-27 NOTE — Telephone Encounter (Signed)
Left message on voicemail per DPR in reference to upcoming appointment scheduled on 12/29/16 at 1000 with detailed instructions given per Myocardial Perfusion Study Information Sheet for the test. LM to arrive 15 minutes early, and that it is imperative to arrive on time for appointment to keep from having the test rescheduled. If you need to cancel or reschedule your appointment, please call the office within 24 hours of your appointment. Failure to do so may result in a cancellation of your appointment, and a $50 no show fee. Phone number given for call back for any questions.    

## 2016-12-28 ENCOUNTER — Telehealth: Payer: Self-pay | Admitting: Gastroenterology

## 2016-12-28 NOTE — Telephone Encounter (Signed)
Manometry results are not in Epic. Have they been emailed to you?

## 2016-12-28 NOTE — Telephone Encounter (Signed)
I read the study, will print it . Thanks

## 2016-12-29 ENCOUNTER — Encounter (INDEPENDENT_AMBULATORY_CARE_PROVIDER_SITE_OTHER): Payer: Self-pay

## 2016-12-29 ENCOUNTER — Ambulatory Visit (HOSPITAL_COMMUNITY): Payer: Medicare Other | Attending: Cardiology

## 2016-12-29 ENCOUNTER — Ambulatory Visit (INDEPENDENT_AMBULATORY_CARE_PROVIDER_SITE_OTHER): Payer: Medicare Other

## 2016-12-29 DIAGNOSIS — R0602 Shortness of breath: Secondary | ICD-10-CM

## 2016-12-29 DIAGNOSIS — R002 Palpitations: Secondary | ICD-10-CM

## 2016-12-29 DIAGNOSIS — I510 Cardiac septal defect, acquired: Secondary | ICD-10-CM | POA: Insufficient documentation

## 2016-12-29 DIAGNOSIS — R1310 Dysphagia, unspecified: Secondary | ICD-10-CM

## 2016-12-29 DIAGNOSIS — R079 Chest pain, unspecified: Secondary | ICD-10-CM

## 2016-12-29 DIAGNOSIS — I519 Heart disease, unspecified: Secondary | ICD-10-CM | POA: Diagnosis not present

## 2016-12-29 DIAGNOSIS — I480 Paroxysmal atrial fibrillation: Secondary | ICD-10-CM

## 2016-12-29 MED ORDER — TECHNETIUM TC 99M TETROFOSMIN IV KIT
10.7000 | PACK | Freq: Once | INTRAVENOUS | Status: AC | PRN
  Administered 2016-12-29: 10.7 via INTRAVENOUS
  Filled 2016-12-29: qty 11

## 2016-12-29 MED ORDER — REGADENOSON 0.4 MG/5ML IV SOLN
0.4000 mg | Freq: Once | INTRAVENOUS | Status: AC
  Administered 2016-12-29: 0.4 mg via INTRAVENOUS

## 2016-12-29 MED ORDER — TECHNETIUM TC 99M TETROFOSMIN IV KIT
32.7000 | PACK | Freq: Once | INTRAVENOUS | Status: AC | PRN
  Administered 2016-12-29: 32.7 via INTRAVENOUS
  Filled 2016-12-29: qty 33

## 2016-12-29 NOTE — Telephone Encounter (Signed)
Patient calls back. She states she is doing better. Pleased that her esophageal manometry study was normal in the findings. She will let us know if she does not continue to improve. Does not want an ENT referral at this time.

## 2016-12-29 NOTE — Telephone Encounter (Signed)
Spoke with Dr Silverio Decamp. She will read the report and contact the patient.

## 2016-12-30 LAB — MYOCARDIAL PERFUSION IMAGING
LV dias vol: 88 mL (ref 46–106)
LV sys vol: 35 mL
Peak HR: 90 {beats}/min
RATE: 0.33
Rest HR: 61 {beats}/min
SDS: 7
SRS: 5
SSS: 12
TID: 0.99

## 2017-01-02 ENCOUNTER — Telehealth: Payer: Self-pay | Admitting: Internal Medicine

## 2017-01-02 NOTE — Telephone Encounter (Signed)
I spoke with the patient regarding the results of her myocardial perfusion stress test. Overall, study was low risk, though some small defects were noted. These mostly reflect artifact, though small area of ischemia cannot be excluded. The patient reports that overall she has been feeling somewhat better since our recent visit. She is wearing the event monitor at this time. She is also scheduled for echocardiogram early next month. We will follow-up these studies and then discuss need for further testing and/or treatment.  Debbie Bush, MD [facility] Pager: [phone]
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
## 2017-01-06 ENCOUNTER — Encounter: Payer: Self-pay | Admitting: Family Medicine

## 2017-01-06 ENCOUNTER — Ambulatory Visit (INDEPENDENT_AMBULATORY_CARE_PROVIDER_SITE_OTHER): Payer: Medicare Other | Admitting: Family Medicine

## 2017-01-06 VITALS — BP 112/74 | HR 78 | Temp 98.2°F | Resp 12 | Wt 210.6 lb

## 2017-01-06 DIAGNOSIS — M545 Low back pain: Secondary | ICD-10-CM

## 2017-01-06 DIAGNOSIS — R079 Chest pain, unspecified: Secondary | ICD-10-CM | POA: Diagnosis not present

## 2017-01-06 DIAGNOSIS — K5900 Constipation, unspecified: Secondary | ICD-10-CM

## 2017-01-06 DIAGNOSIS — G8929 Other chronic pain: Secondary | ICD-10-CM | POA: Diagnosis not present

## 2017-01-06 DIAGNOSIS — M159 Polyosteoarthritis, unspecified: Secondary | ICD-10-CM | POA: Diagnosis not present

## 2017-01-06 DIAGNOSIS — M797 Fibromyalgia: Secondary | ICD-10-CM | POA: Diagnosis not present

## 2017-01-06 DIAGNOSIS — R0602 Shortness of breath: Secondary | ICD-10-CM

## 2017-01-06 MED ORDER — DULOXETINE HCL 20 MG PO CPEP: 40.0000 mg | ORAL_CAPSULE | Freq: Every day | ORAL | 2 refills | Status: DC

## 2017-01-06 NOTE — Progress Notes (Signed)
Pre visit review using our clinic review tool, if applicable. No additional management support is needed unless otherwise documented below in the visit note. 

## 2017-01-06 NOTE — Patient Instructions (Addendum)
A few things to remember from today's visit:   Generalized osteoarthritis of multiple sites - Plan: DULoxetine (CYMBALTA) 20 MG capsule  Fibromyalgia - Plan: DULoxetine (CYMBALTA) 20 MG capsule  Constipation, unspecified constipation type  Chronic bilateral low back pain without sciatica  Incentive spirometric.  Increase Cymbalta to alternating 40 and 20 mg x 30 days then 2 caps daily. Zanaflex at night.  Please be sure medication list is accurate. If a new problem present, please set up appointment sooner than planned today.

## 2017-01-11 ENCOUNTER — Ambulatory Visit: Payer: Medicare Other | Admitting: Gastroenterology

## 2017-01-11 NOTE — Progress Notes (Signed)
PFT done.  

## 2017-01-12 ENCOUNTER — Other Ambulatory Visit: Payer: Self-pay

## 2017-01-12 ENCOUNTER — Ambulatory Visit (HOSPITAL_COMMUNITY): Payer: Medicare Other | Attending: Internal Medicine

## 2017-01-12 DIAGNOSIS — Z9013 Acquired absence of bilateral breasts and nipples: Secondary | ICD-10-CM | POA: Diagnosis not present

## 2017-01-12 DIAGNOSIS — R0602 Shortness of breath: Secondary | ICD-10-CM | POA: Insufficient documentation

## 2017-01-12 DIAGNOSIS — I10 Essential (primary) hypertension: Secondary | ICD-10-CM | POA: Diagnosis not present

## 2017-01-12 DIAGNOSIS — Z853 Personal history of malignant neoplasm of breast: Secondary | ICD-10-CM | POA: Insufficient documentation

## 2017-01-12 DIAGNOSIS — M797 Fibromyalgia: Secondary | ICD-10-CM | POA: Diagnosis not present

## 2017-01-12 DIAGNOSIS — I480 Paroxysmal atrial fibrillation: Secondary | ICD-10-CM | POA: Diagnosis not present

## 2017-01-12 DIAGNOSIS — R079 Chest pain, unspecified: Secondary | ICD-10-CM | POA: Diagnosis not present

## 2017-01-12 DIAGNOSIS — R002 Palpitations: Secondary | ICD-10-CM | POA: Diagnosis not present

## 2017-01-21 ENCOUNTER — Other Ambulatory Visit: Payer: Self-pay | Admitting: Family Medicine

## 2017-01-21 DIAGNOSIS — I4891 Unspecified atrial fibrillation: Secondary | ICD-10-CM

## 2017-02-02 NOTE — Telephone Encounter (Signed)
Labs were ordered by different doctor. No further notes needed

## 2017-02-10 ENCOUNTER — Ambulatory Visit (INDEPENDENT_AMBULATORY_CARE_PROVIDER_SITE_OTHER): Payer: Medicare Other | Admitting: Internal Medicine

## 2017-02-10 ENCOUNTER — Encounter: Payer: Self-pay | Admitting: Internal Medicine

## 2017-02-10 VITALS — BP 120/70 | HR 84 | Ht 70.0 in | Wt 213.1 lb

## 2017-02-10 DIAGNOSIS — I8393 Asymptomatic varicose veins of bilateral lower extremities: Secondary | ICD-10-CM

## 2017-02-10 DIAGNOSIS — R079 Chest pain, unspecified: Secondary | ICD-10-CM | POA: Diagnosis not present

## 2017-02-10 DIAGNOSIS — I480 Paroxysmal atrial fibrillation: Secondary | ICD-10-CM

## 2017-02-10 DIAGNOSIS — R0602 Shortness of breath: Secondary | ICD-10-CM

## 2017-02-10 MED ORDER — DILTIAZEM HCL 30 MG PO TABS: ORAL_TABLET | ORAL | 6 refills | Status: DC

## 2017-02-10 NOTE — Patient Instructions (Addendum)
Medication Instructions:  Use diltiazem 30 mg as needed for palpitations or heart rate greater than 110  Labwork: None   Testing/Procedures: None   Follow-Up: Your physician wants you to follow-up in: 4 months with Dr End. (August 2018)You will receive a reminder letter in the mail two months in advance. If you don't receive a letter, please call our office to schedule the follow-up appointment.     Any Other Special Instructions Will Be Listed Below (If Applicable).  Use knee high compression stockings --put them on in the morning and take them off at night.    If you need a refill on your cardiac medications before your next appointment, please call your pharmacy.
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
## 2017-02-11 DIAGNOSIS — I8393 Asymptomatic varicose veins of bilateral lower extremities: Secondary | ICD-10-CM | POA: Insufficient documentation

## 2017-02-11 DIAGNOSIS — R0789 Other chest pain: Secondary | ICD-10-CM | POA: Insufficient documentation

## 2017-02-11 DIAGNOSIS — R079 Chest pain, unspecified: Secondary | ICD-10-CM | POA: Insufficient documentation
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
## 2017-03-07 ENCOUNTER — Encounter: Payer: Self-pay | Admitting: Family Medicine

## 2017-03-07 ENCOUNTER — Ambulatory Visit (INDEPENDENT_AMBULATORY_CARE_PROVIDER_SITE_OTHER): Payer: Medicare Other | Admitting: Family Medicine

## 2017-03-07 VITALS — BP 110/80 | HR 71 | Resp 12 | Ht 70.0 in | Wt 213.5 lb

## 2017-03-07 DIAGNOSIS — M159 Polyosteoarthritis, unspecified: Secondary | ICD-10-CM

## 2017-03-07 DIAGNOSIS — F411 Generalized anxiety disorder: Secondary | ICD-10-CM

## 2017-03-07 DIAGNOSIS — K5900 Constipation, unspecified: Secondary | ICD-10-CM

## 2017-03-07 DIAGNOSIS — I480 Paroxysmal atrial fibrillation: Secondary | ICD-10-CM | POA: Diagnosis not present

## 2017-03-07 DIAGNOSIS — E2839 Other primary ovarian failure: Secondary | ICD-10-CM | POA: Diagnosis not present

## 2017-03-07 DIAGNOSIS — I8393 Asymptomatic varicose veins of bilateral lower extremities: Secondary | ICD-10-CM | POA: Diagnosis not present

## 2017-03-07 DIAGNOSIS — M797 Fibromyalgia: Secondary | ICD-10-CM

## 2017-03-07 MED ORDER — DULOXETINE HCL 60 MG PO CPEP: 60.0000 mg | ORAL_CAPSULE | Freq: Every day | ORAL | 2 refills | Status: DC

## 2017-03-07 MED ORDER — HYDROCODONE-ACETAMINOPHEN 5-325 MG PO TABS: 1.0000 | ORAL_TABLET | Freq: Two times a day (BID) | ORAL | 0 refills | Status: DC | PRN

## 2017-03-07 NOTE — Patient Instructions (Addendum)
A few things to remember from today's visit:   Fibromyalgia - Plan: DULoxetine (CYMBALTA) 60 MG capsule, HYDROcodone-acetaminophen (NORCO/VICODIN) 5-325 MG tablet  Generalized osteoarthritis of multiple sites - Plan: HYDROcodone-acetaminophen (NORCO/VICODIN) 5-325 MG tablet  Varicose veins of both lower extremities  Constipation, unspecified constipation type  Paroxysmal atrial fibrillation ([place])   Avoid foods that make your symptoms worse, for example coffee, chocolate,pepermeint,alcohol, and greasy food. Raising the head of your bed about 6 inches may help with nocturnal symptoms.   Weight loss (if you are overweight). Avoid lying down for 3 hours after eating.  Instead 3 large meals daily try small and more frequent meals during the day.   You should be evaluated immediately if bloody vomiting, bloody stools, black stools (like tar), difficulty swallowing, food gets stuck on the way down or choking when eating. Abnormal weight loss or severe abdominal pain.  If symptoms are not resolved sometimes endoscopy is necessary.   Please be sure medication list is accurate. If a new problem present, please set up appointment sooner than planned today.    Debbie [name] , Thank you for taking time to come for your Medicare Wellness Visit. I appreciate your ongoing commitment to your health goals. Please review the following plan we discussed and let me know if I can assist you in the future.    [facility]; [phone] [facility] [name]; [phone] Get resource to get information on any and all community programs for Seniors  [place]: [phone] [facility] -[phone] Public Health Dept; Need to be a skilled visit but can assist with bathing as well; [phone]  Will consider a hearing screen sometime this year  Deaf & Hard of Hearing [facility] Services - can assist with hearing aid x 1  No reviews  [facility]  [address]   [phone] - [place] to find disability equipment (used) online; [employer]  To schedule your eye check   See if you can get your colonoscopy report from your last exam   May want dexa repeated in the future; 2012;  Would like to repeat one in [facility] [place]   Will fup with GYN of choice for pelvic exam or to pap  Has hx of partial hsyt   A Tetanus is recommended every 10 years. Medicare covers a tetanus if you have a cut or wound; otherwise, there may be a charge. If you had not had a tetanus with pertusses, known as the Tdap, you can take this anytime.  Had tetanus in 2008;   Had one pneumonia vaccine in [place], not sure of the year; 2008  The Centers for Disease Control are now recommending 2 pneumonia vaccinations after 57. The first is the Prevnar 13. This helps to boost your immunity to community acquired pneumonia as well as some protection from bacterial pneumonia  The 2nd is the pneumovax 23, which offers more broad protection!  Please consider taking these as this is your best protection against pneumonia.  You can make a nurse visit to fup with the prevnar 13    These are the goals we discussed: Goals    . patient          To walk around the complex There is a silver sneaker program for adults   Try the [facility] center  [address] [place], [place] [address] Phone: [phone]        This is a list of the  screening recommended for you and due dates:  Health Maintenance  Topic Date Due  .  Hepatitis C: One time screening is recommended by Center for Disease Control  (CDC) for  adults born from [birth year] through 1965.   [birth year]  . Tetanus Vaccine  08/28/1965  . Mammogram  08/28/1996  . DEXA scan (bone density measurement)  08/29/2011  . Pneumonia vaccines (1 of 2 - PCV13) 08/29/2011  . Flu Shot  06/07/2017  . Colon Cancer Screening  11/07/2021    DASH Eating Plan DASH stands  for "Dietary Approaches to Stop Hypertension." The DASH eating plan is a healthy eating plan that has been shown to reduce high blood pressure (hypertension). It may also reduce your risk for type 2 diabetes, heart disease, and stroke. The DASH eating plan may also help with weight loss. What are tips for following this plan? General guidelines   Avoid eating more than 2,300 mg (milligrams) of salt (sodium) a day. If you have hypertension, you may need to reduce your sodium intake to 1,500 mg a day.  Limit alcohol intake to no more than 1 drink a day for nonpregnant women and 2 drinks a day for men. One drink equals 12 oz of beer, 5 oz of wine, or 1 oz of hard liquor.  Work with your health care provider to maintain a healthy body weight or to lose weight. Ask what an ideal weight is for you.  Get at least 30 minutes of exercise that causes your heart to beat faster (aerobic exercise) most days of the week. Activities may include walking, swimming, or biking.  Work with your health care provider or diet and nutrition specialist (dietitian) to adjust your eating plan to your individual calorie needs. Reading food labels   Check food labels for the amount of sodium per serving. Choose foods with less than 5 percent of the Daily Value of sodium. Generally, foods with less than 300 mg of sodium per serving fit into this eating plan.  To find whole grains, look for the word "whole" as the first word in the ingredient list. Shopping   Buy products labeled as "low-sodium" or "no salt added."  Buy fresh foods. Avoid canned foods and premade or frozen meals. Cooking   Avoid adding salt when cooking. Use salt-free seasonings or herbs instead of table salt or sea salt. Check with your health care provider or pharmacist before using salt substitutes.  Do not fry foods. Cook foods using healthy methods such as baking, boiling, grilling, and broiling instead.  Cook with heart-healthy oils, such as  olive, canola, soybean, or sunflower oil. Meal planning    Eat a balanced diet that includes:  5 or more servings of fruits and vegetables each day. At each meal, try to fill half of your plate with fruits and vegetables.  Up to 6-8 servings of whole grains each day.  Less than 6 oz of lean meat, poultry, or fish each day. A 3-oz serving of meat is about the same size as a deck of cards. One egg equals 1 oz.  2 servings of low-fat dairy each day.  A serving of nuts, seeds, or beans 5 times each week.  Heart-healthy fats. Healthy fats called Omega-3 fatty acids are found in foods such as flaxseeds and coldwater fish, like sardines, salmon, and mackerel.  Limit how much you eat of the following:  Canned or prepackaged foods.  Food that is high in trans fat, such as fried foods.  Food  that is high in saturated fat, such as fatty meat.  Sweets, desserts, sugary drinks, and other foods with added sugar.  Full-fat dairy products.  Do not salt foods before eating.  Try to eat at least 2 vegetarian meals each week.  Eat more home-cooked food and less restaurant, buffet, and fast food.  When eating at a restaurant, ask that your food be prepared with less salt or no salt, if possible. What foods are recommended? The items listed may not be a complete list. Talk with your dietitian about what dietary choices are best for you. Grains  Whole-grain or whole-wheat bread. Whole-grain or whole-wheat pasta. Brown rice. [place]. Bulgur. Whole-grain and low-sodium cereals. Pita bread. Low-fat, low-sodium crackers. Whole-wheat flour tortillas. Vegetables  Fresh or frozen vegetables (raw, steamed, roasted, or grilled). Low-sodium or reduced-sodium tomato and vegetable juice. Low-sodium or reduced-sodium tomato sauce and tomato paste. Low-sodium or reduced-sodium canned vegetables. Fruits  All fresh, dried, or frozen fruit. Canned fruit in natural juice (without added sugar). Meat and  other protein foods  Skinless chicken or [place]. Ground chicken or [place]. Pork with fat trimmed off. Fish and seafood. Egg whites. Dried beans, peas, or lentils. Unsalted nuts, nut butters, and seeds. Unsalted canned beans. Lean cuts of beef with fat trimmed off. Low-sodium, lean deli meat. Dairy  Low-fat (1%) or fat-free (skim) milk. Fat-free, low-fat, or reduced-fat cheeses. Nonfat, low-sodium ricotta or cottage cheese. Low-fat or nonfat yogurt. Low-fat, low-sodium cheese. Fats and oils  Soft margarine without trans fats. Vegetable oil. Low-fat, reduced-fat, or light mayonnaise and salad dressings (reduced-sodium). Canola, safflower, olive, soybean, and sunflower oils. Avocado. Seasoning and other foods  Herbs. Spices. Seasoning mixes without salt. Unsalted popcorn and pretzels. Fat-free sweets. What foods are not recommended? The items listed may not be a complete list. Talk with your dietitian about what dietary choices are best for you. Grains  Baked goods made with fat, such as croissants, muffins, or some breads. Dry pasta or rice meal packs. Vegetables  Creamed or fried vegetables. Vegetables in a cheese sauce. Regular canned vegetables (not low-sodium or reduced-sodium). Regular canned tomato sauce and paste (not low-sodium or reduced-sodium). Regular tomato and vegetable juice (not low-sodium or reduced-sodium). Angie Fava. Olives. Fruits  Canned fruit in a light or heavy syrup. Fried fruit. Fruit in cream or butter sauce. Meat and other protein foods  Fatty cuts of meat. Ribs. Fried meat. Berniece Salines. Sausage. Bologna and other processed lunch meats. Salami. Fatback. Hotdogs. Bratwurst. Salted nuts and seeds. Canned beans with added salt. Canned or smoked fish. Whole eggs or egg yolks. Chicken or [place] with skin. Dairy  Whole or 2% milk, cream, and half-and-half. Whole or full-fat cream cheese. Whole-fat or sweetened yogurt. Full-fat cheese. Nondairy creamers. Whipped toppings. Processed  cheese and cheese spreads. Fats and oils  Butter. Stick margarine. Lard. Shortening. Ghee. Bacon fat. Tropical oils, such as coconut, palm kernel, or palm oil. Seasoning and other foods  Salted popcorn and pretzels. Onion salt, garlic salt, seasoned salt, table salt, and sea salt. Worcestershire sauce. Tartar sauce. Barbecue sauce. Teriyaki sauce. Soy sauce, including reduced-sodium. Steak sauce. Canned and packaged gravies. Fish sauce. Oyster sauce. Cocktail sauce. Horseradish that you find on the shelf. Ketchup. Mustard. Meat flavorings and tenderizers. Bouillon cubes. Hot sauce and Tabasco sauce. Premade or packaged marinades. Premade or packaged taco seasonings. Relishes. Regular salad dressings. Where to find more information:  National Heart, Lung, and [place]: https://wilson-eaton.com/  American Heart Association: www.heart.org Summary  The DASH eating plan  is a healthy eating plan that has been shown to reduce high blood pressure (hypertension). It may also reduce your risk for type 2 diabetes, heart disease, and stroke.  With the DASH eating plan, you should limit salt (sodium) intake to 2,300 mg a day. If you have hypertension, you may need to reduce your sodium intake to 1,500 mg a day.  When on the DASH eating plan, aim to eat more fresh fruits and vegetables, whole grains, lean proteins, low-fat dairy, and heart-healthy fats.  Work with your health care provider or diet and nutrition specialist (dietitian) to adjust your eating plan to your individual calorie needs. This information is not intended to replace advice given to you by your health care provider. Make sure you discuss any questions you have with your health care provider. Document Released: 10/13/2011 Document Revised: 10/17/2016 Document Reviewed: 10/17/2016 Elsevier Interactive Patient Education  2017 [place] Prevention in the Home Falls can cause injuries and can affect people from all age groups.  There are many simple things that you can do to make your home safe and to help prevent falls. What can I do on the outside of my home?  Regularly repair the edges of walkways and driveways and fix any cracks.  Remove high doorway thresholds.  Trim any shrubbery on the main path into your home.  Use bright outdoor lighting.  Clear walkways of debris and clutter, including tools and rocks.  Regularly check that handrails are securely fastened and in good repair. Both sides of any steps should have handrails.  Install guardrails along the edges of any raised decks or porches.  Have leaves, snow, and ice cleared regularly.  Use sand or salt on walkways during winter months.  In the garage, clean up any spills right away, including grease or oil spills. What can I do in the bathroom?  Use night lights.  Install grab bars by the toilet and in the tub and shower. Do not use towel bars as grab bars.  Use non-skid mats or decals on the floor of the tub or shower.  If you need to sit down while you are in the shower, use a plastic, non-slip stool.  Keep the floor dry. Immediately clean up any water that spills on the floor.  Remove soap buildup in the tub or shower on a regular basis.  Attach bath mats securely with double-sided non-slip rug tape.  Remove throw rugs and other tripping hazards from the floor. What can I do in the bedroom?  Use night lights.  Make sure that a bedside light is easy to reach.  Do not use oversized bedding that drapes onto the floor.  Have a firm chair that has side arms to use for getting dressed.  Remove throw rugs and other tripping hazards from the floor. What can I do in the kitchen?  Clean up any spills right away.  Avoid walking on wet floors.  Place frequently used items in easy-to-reach places.  If you need to reach for something above you, use a sturdy step stool that has a grab bar.  Keep electrical cables out of the  way.  Do not use floor polish or wax that makes floors slippery. If you have to use wax, make sure that it is non-skid floor wax.  Remove throw rugs and other tripping hazards from the floor. What can I do in the stairways?  Do not leave any items on the stairs.  Make sure that  there are handrails on both sides of the stairs. Fix handrails that are broken or loose. Make sure that handrails are as long as the stairways.  Check any carpeting to make sure that it is firmly attached to the stairs. Fix any carpet that is loose or worn.  Avoid having throw rugs at the top or bottom of stairways, or secure the rugs with carpet tape to prevent them from moving.  Make sure that you have a light switch at the top of the stairs and the bottom of the stairs. If you do not have them, have them installed. What are some other fall prevention tips?  Wear closed-toe shoes that fit well and support your feet. Wear shoes that have rubber soles or low heels.  When you use a stepladder, make sure that it is completely opened and that the sides are firmly locked. Have someone hold the ladder while you are using it. Do not climb a closed stepladder.  Add color or contrast paint or tape to grab bars and handrails in your home. Place contrasting color strips on the first and last steps.  Use mobility aids as needed, such as canes, walkers, scooters, and crutches.  Turn on lights if it is dark. Replace any light bulbs that burn out.  Set up furniture so that there are clear paths. Keep the furniture in the same spot.  Fix any uneven floor surfaces.  Choose a carpet design that does not hide the edge of steps of a stairway.  Be aware of any and all pets.  Review your medicines with your healthcare provider. Some medicines can cause dizziness or changes in blood pressure, which increase your risk of falling. Talk with your health care provider about other ways that you can decrease your risk of falls. This  may include working with a physical therapist or trainer to improve your strength, balance, and endurance. This information is not intended to replace advice given to you by your health care provider. Make sure you discuss any questions you have with your health care provider. Document Released: 10/14/2002 Document Revised: 03/22/2016 Document Reviewed: 11/28/2014 Elsevier Interactive Patient Education  2017 [place] Maintenance, Female Adopting a healthy lifestyle and getting preventive care can go a long way to promote health and wellness. Talk with your health care provider about what schedule of regular examinations is right for you. This is a good chance for you to check in with your provider about disease prevention and staying healthy. In between checkups, there are plenty of things you can do on your own. Experts have done a lot of research about which lifestyle changes and preventive measures are most likely to keep you healthy. Ask your health care provider for more information. Weight and diet Eat a healthy diet  Be sure to include plenty of vegetables, fruits, low-fat dairy products, and lean protein.  Do not eat a lot of foods high in solid fats, added sugars, or salt.  Get regular exercise. This is one of the most important things you can do for your health.  Most adults should exercise for at least 150 minutes each week. The exercise should increase your heart rate and make you sweat (moderate-intensity exercise).  Most adults should also do strengthening exercises at least twice a week. This is in addition to the moderate-intensity exercise. Maintain a healthy weight  Body mass index (BMI) is a measurement that can be used to identify possible weight problems. It estimates body fat  based on height and weight. Your health care provider can help determine your BMI and help you achieve or maintain a healthy weight.  For females 43 years of age and older:  A BMI  below 18.5 is considered underweight.  A BMI of 18.5 to 24.9 is normal.  A BMI of 25 to 29.9 is considered overweight.  A BMI of 30 and above is considered obese. Watch levels of cholesterol and blood lipids  You should start having your blood tested for lipids and cholesterol at 71 years of age, then have this test every 5 years.  You may need to have your cholesterol levels checked more often if:  Your lipid or cholesterol levels are high.  You are older than 71 years of age.  You are at high risk for heart disease. Cancer screening Lung Cancer  Lung cancer screening is recommended for adults 68-67 years old who are at high risk for lung cancer because of a history of smoking.  A yearly low-dose CT scan of the lungs is recommended for people who:  Currently smoke.  Have quit within the past 15 years.  Have at least a 30-pack-year history of smoking. A pack year is smoking an average of one pack of cigarettes a day for 1 year.  Yearly screening should continue until it has been 15 years since you quit.  Yearly screening should stop if you develop a health problem that would prevent you from having lung cancer treatment. Breast Cancer  Practice breast self-awareness. This means understanding how your breasts normally appear and feel.  It also means doing regular breast self-exams. Let your health care provider know about any changes, no matter how small.  If you are in your 20s or 30s, you should have a clinical breast exam (CBE) by a health care provider every 1-3 years as part of a regular health exam.  If you are 94 or older, have a CBE every year. Also consider having a breast X-ray (mammogram) every year.  If you have a family history of breast cancer, talk to your health care provider about genetic screening.  If you are at high risk for breast cancer, talk to your health care provider about having an MRI and a mammogram every year.  Breast cancer gene (BRCA)  assessment is recommended for women who have family members with BRCA-related cancers. BRCA-related cancers include:  Breast.  Ovarian.  Tubal.  Peritoneal cancers.  Results of the assessment will determine the need for genetic counseling and BRCA1 and BRCA2 testing. Cervical Cancer  Your health care provider may recommend that you be screened regularly for cancer of the pelvic organs (ovaries, uterus, and vagina). This screening involves a pelvic examination, including checking for microscopic changes to the surface of your cervix (Pap test). You may be encouraged to have this screening done every 3 years, beginning at age 15.  For women ages 70-65, health care providers may recommend pelvic exams and Pap testing every 3 years, or they may recommend the Pap and pelvic exam, combined with testing for human papilloma virus (HPV), every 5 years. Some types of HPV increase your risk of cervical cancer. Testing for HPV may also be done on women of any age with unclear Pap test results.  Other health care providers may not recommend any screening for nonpregnant women who are considered low risk for pelvic cancer and who do not have symptoms. Ask your health care provider if a screening pelvic exam is right for you.  If you have had past treatment for cervical cancer or a condition that could lead to cancer, you need Pap tests and screening for cancer for at least 20 years after your treatment. If Pap tests have been discontinued, your risk factors (such as having a new sexual partner) need to be reassessed to determine if screening should resume. Some women have medical problems that increase the chance of getting cervical cancer. In these cases, your health care provider may recommend more frequent screening and Pap tests. Colorectal Cancer  This type of cancer can be detected and often prevented.  Routine colorectal cancer screening usually begins at 71 years of age and continues through 71  years of age.  Your health care provider may recommend screening at an earlier age if you have risk factors for colon cancer.  Your health care provider may also recommend using home test kits to check for hidden blood in the stool.  A small camera at the end of a tube can be used to examine your colon directly (sigmoidoscopy or colonoscopy). This is done to check for the earliest forms of colorectal cancer.  Routine screening usually begins at age 38.  Direct examination of the colon should be repeated every 5-10 years through 71 years of age. However, you may need to be screened more often if early forms of precancerous polyps or small growths are found. Skin Cancer  Check your skin from head to toe regularly.  Tell your health care provider about any new moles or changes in moles, especially if there is a change in a mole's shape or color.  Also tell your health care provider if you have a mole that is larger than the size of a pencil eraser.  Always use sunscreen. Apply sunscreen liberally and repeatedly throughout the day.  Protect yourself by wearing long sleeves, pants, a wide-brimmed hat, and sunglasses whenever you are outside. Heart disease, diabetes, and high blood pressure  High blood pressure causes heart disease and increases the risk of stroke. High blood pressure is more likely to develop in:  People who have blood pressure in the high end of the normal range (130-139/85-89 mm Hg).  People who are overweight or obese.  People who are African American.  If you are 28-22 years of age, have your blood pressure checked every 3-5 years. If you are 19 years of age or older, have your blood pressure checked every year. You should have your blood pressure measured twice-once when you are at a hospital or clinic, and once when you are not at a hospital or clinic. Record the average of the two measurements. To check your blood pressure when you are not at a hospital or clinic,  you can use:  An automated blood pressure machine at a pharmacy.  A home blood pressure monitor.  If you are between 63 years and 81 years old, ask your health care provider if you should take aspirin to prevent strokes.  Have regular diabetes screenings. This involves taking a blood sample to check your fasting blood sugar level.  If you are at a normal weight and have a low risk for diabetes, have this test once every three years after 71 years of age.  If you are overweight and have a high risk for diabetes, consider being tested at a younger age or more often. Preventing infection Hepatitis B  If you have a higher risk for hepatitis B, you should be screened for this virus. You are considered at  high risk for hepatitis B if:  You were born in a country where hepatitis B is common. Ask your health care provider which countries are considered high risk.  Your parents were born in a high-risk country, and you have not been immunized against hepatitis B (hepatitis B vaccine).  You have HIV or AIDS.  You use needles to inject street drugs.  You live with someone who has hepatitis B.  You have had sex with someone who has hepatitis B.  You get hemodialysis treatment.  You take certain medicines for conditions, including cancer, organ transplantation, and autoimmune conditions. Hepatitis C  Blood testing is recommended for:  Everyone born from [birth year] through 1965.  Anyone with known risk factors for hepatitis C. Sexually transmitted infections (STIs)  You should be screened for sexually transmitted infections (STIs) including gonorrhea and chlamydia if:  You are sexually active and are younger than 71 years of age.  You are older than 71 years of age and your health care provider tells you that you are at risk for this type of infection.  Your sexual activity has changed since you were last screened and you are at an increased risk for chlamydia or gonorrhea. Ask your  health care provider if you are at risk.  If you do not have HIV, but are at risk, it may be recommended that you take a prescription medicine daily to prevent HIV infection. This is called pre-exposure prophylaxis (PrEP). You are considered at risk if:  You are sexually active and do not regularly use condoms or know the HIV status of your partner(s).  You take drugs by injection.  You are sexually active with a partner who has HIV. Talk with your health care provider about whether you are at high risk of being infected with HIV. If you choose to begin PrEP, you should first be tested for HIV. You should then be tested every 3 months for as long as you are taking PrEP. Pregnancy  If you are premenopausal and you may become pregnant, ask your health care provider about preconception counseling.  If you may become pregnant, take 400 to 800 micrograms (mcg) of folic acid every day.  If you want to prevent pregnancy, talk to your health care provider about birth control (contraception). Osteoporosis and menopause  Osteoporosis is a disease in which the bones lose minerals and strength with aging. This can result in serious bone fractures. Your risk for osteoporosis can be identified using a bone density scan.  If you are 19 years of age or older, or if you are at risk for osteoporosis and fractures, ask your health care provider if you should be screened.  Ask your health care provider whether you should take a calcium or vitamin D supplement to lower your risk for osteoporosis.  Menopause may have certain physical symptoms and risks.  Hormone replacement therapy may reduce some of these symptoms and risks. Talk to your health care provider about whether hormone replacement therapy is right for you. Follow these instructions at home:  Schedule regular health, dental, and eye exams.  Stay current with your immunizations.  Do not use any tobacco products including cigarettes, chewing  tobacco, or electronic cigarettes.  If you are pregnant, do not drink alcohol.  If you are breastfeeding, limit how much and how often you drink alcohol.  Limit alcohol intake to no more than 1 drink per day for nonpregnant women. One drink equals 12 ounces of beer, 5 ounces of wine,  or 1 ounces of hard liquor.  Do not use street drugs.  Do not share needles.  Ask your health care provider for help if you need support or information about quitting drugs.  Tell your health care provider if you often feel depressed.  Tell your health care provider if you have ever been abused or do not feel safe at home. This information is not intended to replace advice given to you by your health care provider. Make sure you discuss any questions you have with your health care provider. Document Released: 05/09/2011 Document Revised: 03/31/2016 Document Reviewed: 07/28/2015 Elsevier Interactive Patient Education  2017 [employer].

## 2017-03-07 NOTE — Progress Notes (Signed)
Subjective:   Debbie [name] is a 71 y.o. female who presents for Medicare Annual (Subsequent) preventive examination.  HRA assessment completed during this visit with Debbie [name]  The Patient was informed that the wellness visit is to identify future health risk and educate and initiate measures that can reduce risk for increased disease through the lifespan.    NO ROS; Medicare Wellness Visit Last OV:  Today with Debbie [name] Labs completed: Diabetes neg; no lipid report  Medical Hx; Gen OA; Fibro  Bil mastectomy with Breast reconstruction Family Hx; HF; DM; OA; Hyperlipidemia; stroke  Father had colon cancer   Hx atrial fib; Atrial ablation 08/2016  Developed Pleural effusion 11/2016 following Blood clot  Lost 44 lbs in 2 months between Nov and Dec 2017;  drew fluid off lung this year    Describes health as fair, good or great? Recovering from hx of illness  Here for a fup; states MD increased the Cymbalta up to 60mg  today due to back pain  Sent records to Debbie [name] in Debbie [name] Discussed getting a second opinion as she is considering surgery for the long term   Debbie [name]; reviewed for med and heart flutter and she states she is feeling better at present   Update:  Tobacco: never smoked   How many drinks do you have per week?  none  Medications reviewed by Debbie [name]  BMI: 11   Diet;   She has been 209 at home; she was down to 200;  Nutritional counseling given:  Breakfast; cheese puffs to get medicine down; boost Lunch; may eat crackers and banana or sandwich; bologna sandwich Dinner; dtr and son in law cooks for them Fixes steak; may have potatoes, rice, beans; chicken States she gets her greens in as well  Educated regarding sodium   Exercise;  Some housekeeping; it is very hard;  Has to rest between chores Has dtr lives with them  dtr helps them; spouse drives  The patient can drive but has not recently    What is the hardest physical  activity you have done recently and for how long? Trying to clean up    [place];  If she stands, she can't sweep and mop. She can wash dishes and has to sit down; been getting worse x 2 years. Needs "buggy" to shop States cardiologist request she exercise as much as she can Discussing dtr getting membership at a fitness center; Encouraging Debbie [name] to try the pool. Also discussed going to the [employer] does not cover fitness center for them   Fall hx; no falls   Gait: walks with cane; has a walker if she goes to a store Grocery; dtr and she does grocery shopping  Given education on "Fall Prevention in the Home" for more safety tips the patient can apply as appropriate.  Plans to age in place for now and dtr is helping as she lives in   Safety features reviewed for safe community good  firearms if in the home; keep in a safe place smoke alarms; yes sun protection when outside;  driving difficulties or accidents no   Mental Health:  Any emotional problems? Anxious, depressed, irritable, sad or blue? While she was sick she had anxiety but feels better now Denies feeling depressed or hopeless; voices pleasure in daily life How many social activities have you been engaged in within the last 2 weeks? no  Pain: yes;  Back and legs hurt; increasing Cymbalta  Cognitive;  Manages checkbook, medications; no failures of task Ad8 score reviewed for issues;  Issues making decisions; no  Less interest in hobbies / activities" no  Repeats questions, stories; family complaining: NO  Trouble using ordinary gadgets; microwave; computer: no  Forgets the month or year: no  Mismanaging finances: no  Missing apt: no but does write them down  Daily problems with thinking of memory NO Ad8 score is 0   Any dizziness when standing up? Yes   Mobilization and Functional losses from last year to this year? yes  Sleep pattern changes;    Hearing Screening   125Hz  250Hz   500Hz  1000Hz  2000Hz  3000Hz  4000Hz  6000Hz  8000Hz   Right ear:       100    Left ear:     100      Comments: Has not had a hearing screen   Vision Screening Comments: Vision checked; due but postponed due to illness Needs to schedule Went to [facility] works    Scientist, physiological addressed; Completed    Colonoscopy; repeat 11/2021 with family hx of colon cancer  Had one 2 to 3 years ago; went to "somebody" in [place]  Requested she try and get report  Mammogram:  Has had reconstruction surgery; had reconstruction in 75;  Was told she did not need another mammogram unless she had issues  Decided to note she is no longer a candidate and the patient agreed as she will take action if she needs one in the future Dexa: had one when she was 52; showed some "osteopenia"  Started her on calcium; takes 600mg   Vit D 1000 per day Agreed to have a repeat dexa this next year   PAP or GYN eval for remaining ovary on the right Referred to GYN  Hep C checked by Debbie [name] at [facility] and it was negative  Removed the hep c from the [facility]   PCV 13 did not start series to her knowledge but feels she did have a pneumonia. Will check her records and can come in for nurse visit to take  Individual Goal: discussed exercise and low sodium  Barriers; back pain; atrial fib but feels stable now    Objective:     Vitals: BP 110/80   Pulse 71   Resp 12   Ht 5\' 10"  (1.778 m)   Wt 213 lb 8 oz (96.8 kg)   SpO2 96%   BMI 30.63 kg/m   Body mass index is 30.63 kg/m.   Tobacco History  Smoking Status  . Never Smoker  Smokeless Tobacco  . Never Used     Counseling given: Not Answered   Past Medical History:  Diagnosis Date  . Atrial fibrillation ([place])   . Benign essential tremor   . Breast cancer ([place])   . Chronic renal insufficiency   . Common migraine 05/07/2015  . Depression with anxiety   . Drowsiness    Excessive daytime  . Dyslipidemia   . Fibromyalgia   . History of partial seizures   .  Hypertension   . Memory disturbance   . Muscle tension headache   . Obesity   . Obstructive sleep apnea   . S/P epidural steroid injection   . Seizures ([place])    Partial  . Sprain of neck 06/26/2013  . Tremors of nervous system   . Vitamin B12 deficiency    Past Surgical History:  Procedure Laterality Date  . ABDOMINAL HYSTERECTOMY    . ablation procedure  Cardiac, for Afib  . BASAL CELL CARCINOMA EXCISION    . BASAL CELL CARCINOMA EXCISION    . BRAIN MENINGIOMA EXCISION Right   . BREAST RECONSTRUCTION Bilateral   . breast reconstructive    . CHOLECYSTECTOMY    . DILATION AND CURETTAGE OF UTERUS    . dilution    . ESOPHAGEAL MANOMETRY N/A 12/14/2016   Procedure: ESOPHAGEAL MANOMETRY (EM);  Surgeon: Mauri Pole, MD;  Location: [facility];  Service: Endoscopy;  Laterality: N/A;  . gallbladder resection    . LUMBAR SPINE SURGERY N/A   . lumbosacral    . mastectomies Bilateral   . NOSE SURGERY     Basal cell carcinoma resection from the nose  . TUBAL LIGATION N/A    Family History  Problem Relation Age of Onset  . Heart failure Mother   . Diabetes Mother   . Arthritis Mother   . Hyperlipidemia Mother   . Heart disease Mother   . Stroke Mother   . Hypertension Mother   . Cancer - Colon Father   . Colon cancer Father     late 62's  . Arthritis Father   . Cancer Father   . Breast cancer Sister   . Diabetes Maternal Grandfather   . Heart failure Maternal Grandfather   . Stroke Maternal Grandmother   . Lung disease Neg Hx    History  Sexual Activity  . Sexual activity: Not on file    Outpatient Encounter Prescriptions as of 03/07/2017  Medication Sig  . acetaminophen (TYLENOL) 500 MG tablet Take 1,000 mg by mouth every 6 (six) hours as needed.  . calcium carbonate (OS-CAL) 600 MG TABS tablet Take 600 mg by mouth daily with breakfast.  . CARTIA XT 120 MG 24 hr capsule TAKE ONE CAPSULE BY MOUTH ONCE DAILY  . diltiazem (CARDIZEM) 30 MG tablet Take 1 tablet  as needed for palpitations or heart rate greater than 110  . ergocalciferol (VITAMIN D2) 50000 units capsule Take 1 capsule (50,000 Units total) by mouth once a week.  . gabapentin (NEURONTIN) 300 MG capsule TAKE ONE CAPSULE BY MOUTH IN THE MORNING AND AT NOON, AND TWO IN THE EVENING  . HYDROcodone-acetaminophen (NORCO/VICODIN) 5-325 MG tablet Take 1 tablet by mouth every 12 (twelve) hours as needed for moderate pain. No more than 40 tabs per month  . lamoTRIgine (LAMICTAL) 25 MG tablet Take 1 tablet (25 mg total) by mouth 2 (two) times daily.  . promethazine (PHENERGAN) 25 MG tablet Take 1 tablet (25 mg total) by mouth every 6 (six) hours as needed for nausea or vomiting.  . rivaroxaban (XARELTO) 20 MG TABS tablet Take 1 tablet (20 mg total) by mouth daily with supper.  Marland Kitchen tiZANidine (ZANAFLEX) 2 MG tablet Take 1 tablet (2 mg total) by mouth 3 (three) times daily.  . [DISCONTINUED] DULoxetine (CYMBALTA) 20 MG capsule Take 2 capsules (40 mg total) by mouth daily.  . [DISCONTINUED] HYDROcodone-acetaminophen (NORCO/VICODIN) 5-325 MG tablet Take 1 tablet by mouth every 12 (twelve) hours as needed for moderate pain.  . DULoxetine (CYMBALTA) 60 MG capsule Take 1 capsule (60 mg total) by mouth daily.   Facility-Administered Encounter Medications as of 03/07/2017  Medication  . 0.9 %  sodium chloride infusion    Activities of Daily Living In your present state of health, do you have any difficulty performing the following activities: 03/07/2017 01/06/2017  Hearing? - N  Vision? - N  Difficulty concentrating or making decisions? - N  Walking  or climbing stairs? - N  Dressing or bathing? - N  Doing errands, shopping? - N  Conservation officer, nature and eating ? N -  Using the Toilet? N -  In the past six months, have you accidently leaked urine? N -  Do you have problems with loss of bowel control? N -  Managing your Medications? N -  Managing your Finances? N -  Housekeeping or managing your Housekeeping? N -    Some recent data might be hidden    Patient Care Team: [name] [name], MD as PCP - General (Family Medicine) Archie Jearld Adjutant. (Inactive) as Consulting Physician (Cardiology) Biagio Quint, MD as Referring Physician (Nephrology) Zonia Kief, MD as Consulting Physician (Rehabilitation)    Assessment:   Exercise Activities and Dietary recommendations Current Exercise Habits: Home exercise routine, Type of exercise: walking, Exercise limited by: neurologic condition(s);orthopedic condition(s)  Goals    . patient          To walk around the complex There is a silver sneaker program for adults   Try the [facility] center  [address] [place], [place] [address] Phone: [phone]       Fall Risk Fall Risk  03/07/2017 01/06/2017  Falls in the past year? No Yes  Number falls in past yr: - 1  Injury with Fall? - Yes  Risk for fall due to : - Other (Comment)   Depression Screen PHQ 2/9 Scores 03/07/2017 01/06/2017  PHQ - 2 Score 0 0     Cognitive Function no issues to date      Immunization History  Administered Date(s) Administered  . Influenza, High Dose Seasonal PF 12/09/2016   Screening Tests Health Maintenance  Topic Date Due  . TETANUS/TDAP  08/28/1965  . DEXA SCAN  08/29/2011  . PNA vac Low Risk Adult (1 of 2 - PCV13) 08/29/2011  . INFLUENZA VACCINE  06/07/2017  . COLONOSCOPY  11/07/2021      Plan:    PCP Notes  Health Maintenance Ordered Dexa to repeat sometime this year/ states she had osteopenia but may have been completed prior to 70 yo.  Took out mammogram as her oncologist and plastic surgeon have told her she does not need a mammogram unless she becomes symptomatic or starts having issues of which she will have a mammogram prn  No pneumonia series but feels she may have had one. Will check records and will take at the office or pharmacy when she feels better.   A Tetanus is recommended every 10 years. Medicare covers a tetanus if you have a  cut or wound; otherwise, there may be a charge. If you had not had a tetanus with pertusses, known as the Tdap, you can take this anytime.   States she had a colonoscopy within the last 2 years. Will check on report and send to the office   She will schedule an Eye exam    Abnormal Screens  2000 hz in the left ear; will elect to have a hearing screen later this year. Declined referral today; given resources for free hearing aid  Referrals none today Did states she had a partial hyst with one ovary. Referred to GYN for fup   Patient concerns; back pain   Nurse Concerns; Discussed exercise options, lowering sodium and a health diet   Next PCP apt was seen today   I have personally reviewed and noted the following in the patient's chart:   . Medical and social history . Use  of alcohol, tobacco or illicit drugs  . Current medications and supplements . Functional ability and status . Nutritional status . Physical activity . Advanced directives . List of other physicians . Hospitalizations, surgeries, and ER visits in previous 12 months . Vitals . Screenings to include cognitive, depression, and falls . Referrals and appointments  In addition, I have reviewed and discussed with patient certain preventive protocols, quality metrics, and best practice recommendations. A written personalized care plan for preventive services as well as general preventive health recommendations were provided to patient.     TDDUK,GURKY, RN  03/07/2017   I have reviewed documentation from this visit and I agree with recommendations given.  [name] [name], MD  [facility]. [facility].

## 2017-03-07 NOTE — Progress Notes (Signed)
Pre visit review using our clinic review tool, if applicable. No additional management support is needed unless otherwise documented below in the visit note. 

## 2017-03-09 ENCOUNTER — Telehealth: Payer: Self-pay | Admitting: Family Medicine

## 2017-03-09 NOTE — Telephone Encounter (Signed)
Dr. Jordan - Please advise. Thanks! 

## 2017-03-09 NOTE — Telephone Encounter (Signed)
If she is certain dizziness is a new symptom caused by higher dose of Cymbalta , decreased dose from 60 mg back to 40 mg (Cymbalta 20 mg 2 caps daily).  Thanks, Debbie [name]

## 2017-03-09 NOTE — Telephone Encounter (Signed)
Patient Name: SAMANATHA BRAMMER  DOB: 1946/09/04    Initial Comment Caller states c/o dizziness. She states she recently had an increase in Cymbalta.   Nurse Assessment  Nurse: Raphael Gibney, RN, Vanita Ingles Date/Time (Eastern Time): 03/09/2017 1:43:19 PM  Confirm and document reason for call. If symptomatic, describe symptoms. ---Caller states she recently had an increase in cymbalta. Started cymbalta 60 mg yesterday. She has hold on to walls when she goes to the bathroom. Had been on cymbalta 40 mg qd. Cymbalta 40 mg did not make her dizzy. She believes that the increase in cymbalta is causing her dizziness.  Does the patient have any new or worsening symptoms? ---Yes  Will a triage be completed? ---Yes  Related visit to physician within the last 2 weeks? ---No  Does the PT have any chronic conditions? (i.e. diabetes, asthma, etc.) ---Yes  List chronic conditions. ---fibromyalgia; A fib;  Is this a behavioral health or substance abuse call? ---No     Guidelines    Guideline Title Affirmed Question Affirmed Notes  Dizziness - Lightheadedness Taking a medicine that could cause dizziness (e.g., blood pressure medications, diuretics)    Final Disposition User   See Physician within [facility], RN, Vanita Ingles    Comments  pt does not want to make appt as she was in the office on Tuesday. She wants to know how much cymbalta she should take. The 40 mg did not make her dizzy. Please call pt back regarding medication.  advised caller as per drugs.com that there are not interactions between extra strength tylenol and cymbalta Verbalized understanding.   Referrals  GO TO FACILITY REFUSED   Disagree/Comply: Disagree  Disagree/Comply Reason: Disagree with instructions

## 2017-03-10 MED ORDER — DULOXETINE HCL 20 MG PO CPEP: 40.0000 mg | ORAL_CAPSULE | Freq: Every day | ORAL | 5 refills | Status: DC

## 2017-03-10 NOTE — Telephone Encounter (Signed)
Spoke with pt and gave recommendations. She agrees to go back to 40mg  daily. Rx sent to pharmacy. Nothing further needed at this time.

## 2017-04-04 DIAGNOSIS — N182 Chronic kidney disease, stage 2 (mild): Secondary | ICD-10-CM | POA: Diagnosis not present

## 2017-04-12 DIAGNOSIS — N182 Chronic kidney disease, stage 2 (mild): Secondary | ICD-10-CM | POA: Diagnosis not present

## 2017-04-12 DIAGNOSIS — I129 Hypertensive chronic kidney disease with stage 1 through stage 4 chronic kidney disease, or unspecified chronic kidney disease: Secondary | ICD-10-CM | POA: Diagnosis not present

## 2017-04-27 ENCOUNTER — Telehealth: Payer: Self-pay | Admitting: Family Medicine

## 2017-04-27 DIAGNOSIS — M797 Fibromyalgia: Secondary | ICD-10-CM

## 2017-04-27 DIAGNOSIS — M159 Polyosteoarthritis, unspecified: Secondary | ICD-10-CM

## 2017-04-27 NOTE — Telephone Encounter (Signed)
Pt request refill  °HYDROcodone-acetaminophen (NORCO/VICODIN) 5-325 MG tablet °

## 2017-04-28 MED ORDER — HYDROCODONE-ACETAMINOPHEN 5-325 MG PO TABS: 1.0000 | ORAL_TABLET | Freq: Two times a day (BID) | ORAL | 0 refills | Status: DC | PRN

## 2017-04-28 NOTE — Telephone Encounter (Signed)
Patient last had Rx filled on 03/07/17.  Okay to refill?

## 2017-04-28 NOTE — Telephone Encounter (Signed)
Left voicemail for patient letting her know Rx is up front and ready for pick up.

## 2017-04-28 NOTE — Telephone Encounter (Signed)
Rx printed for MD signature

## 2017-04-28 NOTE — Telephone Encounter (Signed)
Hydrocodone-Acetaminophen 5-325 mg to continue bid as needed can be printed,no more than 40 tabs for 30 days.  #40/0.  Thanks, BJ

## 2017-05-06 ENCOUNTER — Other Ambulatory Visit: Payer: Self-pay | Admitting: Neurology
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
## 2017-05-08 ENCOUNTER — Other Ambulatory Visit: Payer: Self-pay

## 2017-05-08 ENCOUNTER — Encounter: Payer: Self-pay | Admitting: Family Medicine

## 2017-05-08 ENCOUNTER — Ambulatory Visit (INDEPENDENT_AMBULATORY_CARE_PROVIDER_SITE_OTHER): Payer: Medicare Other | Admitting: Family Medicine

## 2017-05-08 VITALS — BP 128/80 | HR 80 | Resp 12 | Ht 70.0 in | Wt 219.3 lb

## 2017-05-08 DIAGNOSIS — M79605 Pain in left leg: Secondary | ICD-10-CM

## 2017-05-08 DIAGNOSIS — M797 Fibromyalgia: Secondary | ICD-10-CM

## 2017-05-08 DIAGNOSIS — F411 Generalized anxiety disorder: Secondary | ICD-10-CM | POA: Diagnosis not present

## 2017-05-08 DIAGNOSIS — G8929 Other chronic pain: Secondary | ICD-10-CM | POA: Diagnosis not present

## 2017-05-08 DIAGNOSIS — M79604 Pain in right leg: Secondary | ICD-10-CM

## 2017-05-08 DIAGNOSIS — M545 Low back pain: Secondary | ICD-10-CM

## 2017-05-08 DIAGNOSIS — R110 Nausea: Secondary | ICD-10-CM | POA: Diagnosis not present

## 2017-05-08 DIAGNOSIS — R112 Nausea with vomiting, unspecified: Secondary | ICD-10-CM | POA: Insufficient documentation

## 2017-05-08 DIAGNOSIS — M159 Polyosteoarthritis, unspecified: Secondary | ICD-10-CM

## 2017-05-08 MED ORDER — LAMOTRIGINE 25 MG PO TABS: 25.0000 mg | ORAL_TABLET | Freq: Two times a day (BID) | ORAL | 1 refills | Status: DC

## 2017-05-08 NOTE — Patient Instructions (Signed)
A few things to remember from today's visit:   [name], today we have followed on some of your chronic medical problems and they seem to be stable overall, so no changes in current management today.  Review medication list and be sure it is accurate.  -Remember a healthy diet and regular physical activity (as tolerated)  are very important for prevention as well as for well being; they also help with many chronic problems, decreasing the need of adding new medications and delaying or preventing possible complications.  Appointment for pain clinic will be arranged.  I will see you back in 4 months.  Remember to arrange your follow up appt before leaving today.  Please follow sooner than planned if a new concern arises.   Chronic bilateral low back pain without sciatica - Plan: Ambulatory referral to Pain Clinic  Fibromyalgia - Plan: Ambulatory referral to Pain Clinic  Generalized anxiety disorder  Generalized osteoarthritis of multiple sites - Plan: Ambulatory referral to Pain Clinic  Varicose veins of both lower extremities    Please be sure medication list is accurate. If a new problem present, please set up appointment sooner than planned today.

## 2017-05-25 ENCOUNTER — Ambulatory Visit (INDEPENDENT_AMBULATORY_CARE_PROVIDER_SITE_OTHER): Payer: Medicare Other | Admitting: Neurology

## 2017-05-25 ENCOUNTER — Encounter: Payer: Self-pay | Admitting: Neurology

## 2017-05-25 VITALS — BP 122/68 | HR 67 | Ht 70.0 in | Wt 220.0 lb

## 2017-05-25 DIAGNOSIS — M797 Fibromyalgia: Secondary | ICD-10-CM | POA: Diagnosis not present

## 2017-05-25 DIAGNOSIS — R413 Other amnesia: Secondary | ICD-10-CM

## 2017-05-25 DIAGNOSIS — M2550 Pain in unspecified joint: Secondary | ICD-10-CM

## 2017-05-25 DIAGNOSIS — G43009 Migraine without aura, not intractable, without status migrainosus: Secondary | ICD-10-CM

## 2017-05-25 MED ORDER — TIZANIDINE HCL 2 MG PO TABS: 2.0000 mg | ORAL_TABLET | Freq: Three times a day (TID) | ORAL | 3 refills | Status: DC

## 2017-05-25 MED ORDER — GABAPENTIN 600 MG PO TABS: 600.0000 mg | ORAL_TABLET | Freq: Three times a day (TID) | ORAL | 3 refills | Status: DC

## 2017-05-25 NOTE — Progress Notes (Signed)
Reason for visit: Fibromyalgia  Debbie [name] is an 71 y.o. female  History of present illness:  Debbie [name] is a 71 year old right-handed white female with a history of fibromyalgia on gabapentin. The patient has a history of headaches but this is not a significant issue for her. Her main complaint today is that she is having significant problems with polyarthralgias throughout her entire body. She has bilateral shoulder, elbow, thumb, hip, knee, ankle, and low back pain. The patient also has neck pain. Most of her pain is within the joints, not in the muscle. The patient is not sleeping well at night and she is drowsy during the day. The patient reports that she has severe hip pain when she first gets up, when she starts to walk the pain lessens but still continues. She is limited on how far she can walk. She uses a quad cane for ambulation, she reports no recent falls. She is on Xarelto, she cannot take arthritis medications. The patient does have hydrocodone to take. She has had MRI evaluation of the low back 2 years ago that did show multilevel facet joint arthritis. She returns to this office for an evaluation.  Past Medical History:  Diagnosis Date  . Atrial fibrillation ([place])   . Benign essential tremor   . Breast cancer ([place])   . Chronic renal insufficiency   . Common migraine 05/07/2015  . Depression with anxiety   . Drowsiness    Excessive daytime  . Dyslipidemia   . Fibromyalgia   . History of partial seizures   . Hypertension   . Memory disturbance   . Muscle tension headache   . Obesity   . Obstructive sleep apnea   . S/P epidural steroid injection   . Seizures ([place])    Partial  . Sprain of neck 06/26/2013  . Tremors of nervous system   . Vitamin B12 deficiency     Past Surgical History:  Procedure Laterality Date  . ABDOMINAL HYSTERECTOMY    . ablation procedure     Cardiac, for Afib  . BASAL CELL CARCINOMA EXCISION    . BASAL CELL CARCINOMA EXCISION    .  BRAIN MENINGIOMA EXCISION Right   . BREAST RECONSTRUCTION Bilateral   . breast reconstructive    . CHOLECYSTECTOMY    . DILATION AND CURETTAGE OF UTERUS    . dilution    . ESOPHAGEAL MANOMETRY N/A 12/14/2016   Procedure: ESOPHAGEAL MANOMETRY (EM);  Surgeon: Mauri Pole, MD;  Location: [facility];  Service: Endoscopy;  Laterality: N/A;  . gallbladder resection    . LUMBAR SPINE SURGERY N/A   . lumbosacral    . mastectomies Bilateral   . NOSE SURGERY     Basal cell carcinoma resection from the nose  . TUBAL LIGATION N/A     Family History  Problem Relation Age of Onset  . Heart failure Mother   . Diabetes Mother   . Arthritis Mother   . Hyperlipidemia Mother   . Heart disease Mother   . Stroke Mother   . Hypertension Mother   . Cancer - Colon Father   . Colon cancer Father        late 80's  . Arthritis Father   . Cancer Father   . Breast cancer Sister   . Diabetes Maternal Grandfather   . Heart failure Maternal Grandfather   . Stroke Maternal Grandmother   . Lung disease Neg Hx     Social history:  reports  that she has never smoked. She has never used smokeless tobacco. She reports that she does not drink alcohol or use drugs.    Allergies  Allergen Reactions  . Adhesive [Tape] Other (See Comments)    Peels skin off (bandaids, too)  . Atorvastatin Other (See Comments)  . Tramadol Other (See Comments) and Nausea And Vomiting  . Buprenorphine Hcl Nausea And Vomiting  . Codeine Nausea And Vomiting  . Fish Oil Nausea And Vomiting  . Morphine And Related Nausea And Vomiting    Medications:  Prior to Admission medications   Medication Sig Start Date End Date Taking? Authorizing Provider  acetaminophen (TYLENOL) 500 MG tablet Take 1,000 mg by mouth every 6 (six) hours as needed.   Yes [provider]  calcium carbonate (OS-CAL) 600 MG TABS tablet Take 600 mg by mouth daily with breakfast.   Yes [provider]  CARTIA XT 120 MG 24 hr capsule  TAKE ONE CAPSULE BY MOUTH ONCE DAILY 01/23/17  Yes [name], [name], MD  diltiazem (CARDIZEM) 30 MG tablet Take 1 tablet as needed for palpitations or heart rate greater than 110 02/10/17  Yes End, Harrell Gave, MD  DULoxetine (CYMBALTA) 20 MG capsule Take 2 capsules (40 mg total) by mouth daily. 03/10/17  Yes [name], [name], MD  ergocalciferol (VITAMIN D2) 50000 units capsule Take 1 capsule (50,000 Units total) by mouth once a week. 11/29/16  Yes [name], [name], MD  gabapentin (NEURONTIN) 300 MG capsule TAKE ONE CAPSULE BY MOUTH IN THE MORNING AND AT NOON, AND TWO IN THE EVENING 11/09/16  Yes Kathrynn Ducking, MD  HYDROcodone-acetaminophen (NORCO/VICODIN) 5-325 MG tablet Take 1 tablet by mouth every 12 (twelve) hours as needed for moderate pain. No more than 40 tabs per month 04/28/17  Yes [name], [name], MD  lamoTRIgine (LAMICTAL) 25 MG tablet Take 1 tablet (25 mg total) by mouth 2 (two) times daily. 05/08/17  Yes Kathrynn Ducking, MD  promethazine (PHENERGAN) 25 MG tablet Take 1 tablet (25 mg total) by mouth every 6 (six) hours as needed for nausea or vomiting. 11/12/14  Yes Kathrynn Ducking, MD  rivaroxaban (XARELTO) 20 MG TABS tablet Take 1 tablet (20 mg total) by mouth daily with supper. 12/22/16  Yes End, Harrell Gave, MD  tiZANidine (ZANAFLEX) 2 MG tablet Take 1 tablet (2 mg total) by mouth 3 (three) times daily. 11/09/16  Yes Kathrynn Ducking, MD    ROS:  Out of a complete 14 system review of symptoms, the patient complains only of the following symptoms, and all other reviewed systems are negative.  Decreased activity, chills, fatigue Hearing loss, ringing in ears, runny nose, difficulty swallowing Eye itching, light sensitivity Shortness of breath Leg swelling, palpitations of the heart Cold intolerance, heat intolerance, excessive thirst, flushing Abdominal pain, constipation, nausea Insomnia, frequent waking, daytime sleepiness, snoring Environmental allergies Urinary urgency Joint pain,  joint swelling, back pain, aching muscles, muscle cramps, walking difficulty, neck pain, neck stiffness Moles, itching Bruising easily Memory loss, dizziness, headache, numbness, weakness, tremors Confusion, decreased concentration, anxiety  Blood pressure 122/68, pulse 67, height 5\' 10"  (1.778 m), weight 220 lb (99.8 kg).  Physical Exam  General: The patient is alert and cooperative at the time of the examination. The patient is moderately obese.  Neuromuscular: The patient is able to flex the low back to about 90. There is some discomfort with internal and external rotation of the hips bilaterally.  Skin: No significant peripheral edema is noted.  Neurologic Exam  Mental status: The patient is alert and oriented x 3 at the time of the examination. The patient has apparent normal recent and remote memory, with an apparently normal attention span and concentration ability.   Cranial nerves: Facial symmetry is present. Speech is normal, no aphasia or dysarthria is noted. Extraocular movements are full. Visual fields are full.  Motor: The patient has good strength in all 4 extremities.  Sensory examination: Soft touch sensation is symmetric on the face, arms, and legs.  Coordination: The patient has good finger-nose-finger and heel-to-shin bilaterally.  Gait and station: The patient has a slightly wide-based gait, the patient uses a quad cane for ambulation. Tandem gait is unsteady. Romberg is negative. No drift is seen.  Reflexes: Deep tendon reflexes are symmetric.   MRI lumbar 05/23/15:  IMPRESSION:  This is an abnormal MRI of the lumbar spine showing multilevel degenerative changes as detailed above. When compared to an MRI dated 02/13/2013, there has been no significant interval change. There does not appear to be nerve root impingement at any of the lumbar levels.    Assessment/Plan:  1. Fibromyalgia  2. Migraine headache  3. Polyarthralgias  The patient is  having diffuse arthritis based pain. Her discomfort is within the joints, not the muscles. She is having difficulty sleeping because of pain. We will go up on the gabapentin taking 600 mg 3 times daily. The patient will have blood work looking for a connective tissue disease processes that may affect the joints. The patient will follow-up in 6 months. A prescription was sent in for tizanidine and for gabapentin.  Jill Alexanders MD 05/25/2017 11:17 AM  [facility] [address] [place], [place] [address]  Phone [phone] Fax [phone]

## 2017-05-25 NOTE — Patient Instructions (Signed)
   We will go up on the gabapentin to 600 mg three times a day. 

## 2017-05-27 LAB — RHEUMATOID FACTOR: Rhuematoid fact SerPl-aCnc: <10 IU/mL (ref 0.0–13.9)

## 2017-05-27 LAB — SEDIMENTATION RATE: Sed Rate: 8 mm/hr (ref 0–40)

## 2017-05-27 LAB — B. BURGDORFI ANTIBODIES: Lyme IgG/IgM Ab: <0.91 {ISR} (ref 0.00–0.90)

## 2017-05-27 LAB — ANA W/REFLEX: Anti Nuclear Antibody(ANA): NEGATIVE

## 2017-05-29 ENCOUNTER — Telehealth: Payer: Self-pay | Admitting: *Deleted

## 2017-05-29 NOTE — Telephone Encounter (Signed)
-----   Message from Kathrynn Ducking, MD sent at 05/28/2017  2:57 PM EDT -----  The blood work results are unremarkable. Please call the patient.  ----- Message ----- From: Lavone Neri Lab Results In Sent: 05/26/2017   2:35 PM To: Kathrynn Ducking, MD

## 2017-05-29 NOTE — Telephone Encounter (Signed)
Called and spoke with pt about unremarkable labs per CW,MD note. Patient verbalized understanding.  

## 2017-06-05 ENCOUNTER — Telehealth: Payer: Self-pay | Admitting: Family Medicine

## 2017-06-05 DIAGNOSIS — M159 Polyosteoarthritis, unspecified: Secondary | ICD-10-CM

## 2017-06-05 DIAGNOSIS — M797 Fibromyalgia: Secondary | ICD-10-CM

## 2017-06-05 NOTE — Telephone Encounter (Signed)
Rx last filled 05/03/17.  Okay to refill?

## 2017-06-05 NOTE — Telephone Encounter (Signed)
Pt would like new rx hydrocodone. Pt would like md to know she was excused from jury duty

## 2017-06-06 NOTE — Telephone Encounter (Signed)
It is Ok to print Rx for Hydrocodone-Acetaminophen. I thought we gave her an excused letter for jury duty last OV due to chronic pain and anxiety, it is Ok to do so.  Thanks, BJ

## 2017-06-07 MED ORDER — HYDROCODONE-ACETAMINOPHEN 5-325 MG PO TABS: 1.0000 | ORAL_TABLET | Freq: Two times a day (BID) | ORAL | 0 refills | Status: DC | PRN

## 2017-06-07 NOTE — Telephone Encounter (Signed)
Called and spoke with patient to let her know Rx is up front and ready to be picked up.

## 2017-06-07 NOTE — Telephone Encounter (Signed)
Rx printed for MD signature

## 2017-06-14 ENCOUNTER — Encounter: Payer: Self-pay | Admitting: Physical Medicine & Rehabilitation
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
## 2017-06-19 ENCOUNTER — Encounter (INDEPENDENT_AMBULATORY_CARE_PROVIDER_SITE_OTHER): Payer: Self-pay

## 2017-06-19 ENCOUNTER — Encounter: Payer: Self-pay | Admitting: *Deleted

## 2017-06-19 ENCOUNTER — Ambulatory Visit (INDEPENDENT_AMBULATORY_CARE_PROVIDER_SITE_OTHER): Payer: Medicare Other | Admitting: Internal Medicine

## 2017-06-19 ENCOUNTER — Encounter: Payer: Self-pay | Admitting: Internal Medicine

## 2017-06-19 VITALS — BP 122/64 | HR 68 | Ht 70.0 in | Wt 225.0 lb

## 2017-06-19 DIAGNOSIS — R0789 Other chest pain: Secondary | ICD-10-CM | POA: Insufficient documentation

## 2017-06-19 DIAGNOSIS — R0609 Other forms of dyspnea: Secondary | ICD-10-CM | POA: Diagnosis not present

## 2017-06-19 DIAGNOSIS — R0600 Dyspnea, unspecified: Secondary | ICD-10-CM

## 2017-06-19 DIAGNOSIS — I10 Essential (primary) hypertension: Secondary | ICD-10-CM

## 2017-06-19 DIAGNOSIS — I481 Persistent atrial fibrillation: Secondary | ICD-10-CM | POA: Diagnosis not present

## 2017-06-19 DIAGNOSIS — R51 Headache: Secondary | ICD-10-CM

## 2017-06-19 DIAGNOSIS — I4819 Other persistent atrial fibrillation: Secondary | ICD-10-CM

## 2017-06-19 DIAGNOSIS — R519 Headache, unspecified: Secondary | ICD-10-CM | POA: Insufficient documentation

## 2017-06-19 DIAGNOSIS — R12 Heartburn: Secondary | ICD-10-CM | POA: Insufficient documentation

## 2017-06-19 DIAGNOSIS — G8929 Other chronic pain: Secondary | ICD-10-CM | POA: Diagnosis not present

## 2017-06-19 DIAGNOSIS — Z9889 Other specified postprocedural states: Secondary | ICD-10-CM

## 2017-06-19 DIAGNOSIS — R112 Nausea with vomiting, unspecified: Secondary | ICD-10-CM | POA: Insufficient documentation

## 2017-06-19 DIAGNOSIS — M1990 Unspecified osteoarthritis, unspecified site: Secondary | ICD-10-CM | POA: Insufficient documentation

## 2017-06-19 DIAGNOSIS — K5792 Diverticulitis of intestine, part unspecified, without perforation or abscess without bleeding: Secondary | ICD-10-CM | POA: Insufficient documentation

## 2017-06-19 DIAGNOSIS — F40240 Claustrophobia: Secondary | ICD-10-CM | POA: Insufficient documentation

## 2017-06-19 NOTE — Patient Instructions (Addendum)
Medication Instructions:   Your physician recommends that you continue on your current medications as directed. Please refer to the Current Medication list given to you today.   Labwork: None   Testing/Procedures: None  Follow-Up: Your physician recommends that you schedule a follow-up appointment in: 4 months with Dr End.    Any Other Special Instructions Will Be Listed Below (If Applicable). You can go to the [employer] below to read more about it. [email or web address]   [facility] Dr Dellis Filbert Jenkins-[phone] --back doctor  If you need a refill on your cardiac medications before your next appointment, please call your pharmacy.

## 2017-07-04 ENCOUNTER — Telehealth: Payer: Self-pay | Admitting: Family Medicine

## 2017-07-04 NOTE — Telephone Encounter (Signed)
Rx last filled 06/08/17.

## 2017-07-04 NOTE — Telephone Encounter (Signed)
Pt need new Rx for hydrocodone  5-325 MG  Pt is aware of 3 business days for refills and someone will call when ready for pick up.  Pt state that she has an appointment to go to pain management on 07/13/17.

## 2017-07-05 ENCOUNTER — Other Ambulatory Visit: Payer: Self-pay | Admitting: Family Medicine

## 2017-07-05 DIAGNOSIS — M159 Polyosteoarthritis, unspecified: Secondary | ICD-10-CM

## 2017-07-05 DIAGNOSIS — M797 Fibromyalgia: Secondary | ICD-10-CM

## 2017-07-05 MED ORDER — HYDROCODONE-ACETAMINOPHEN 5-325 MG PO TABS: 1.0000 | ORAL_TABLET | Freq: Two times a day (BID) | ORAL | 0 refills | Status: AC | PRN

## 2017-07-05 NOTE — Telephone Encounter (Signed)
Rx printed and ready for pick up. Thanks, BJ

## 2017-07-06 NOTE — Telephone Encounter (Signed)
Pt calling to check the status of the Rx wanted to pick it up while in the area.  Did not see it.

## 2017-07-07 NOTE — Telephone Encounter (Signed)
I called and spoke with patient. I let her know that the Rx is up front and ready for pick up. I also advised patient that the Rx is only for 10 tablets to hold her over until she gets in with pain management, patient verbalized understanding.

## 2017-07-07 NOTE — Telephone Encounter (Signed)
Pt provided ID & signed log and picked up script. cb

## 2017-07-13 ENCOUNTER — Encounter: Payer: Medicare Other | Attending: Physical Medicine & Rehabilitation | Admitting: Physical Medicine & Rehabilitation

## 2017-07-13 ENCOUNTER — Encounter: Payer: Self-pay | Admitting: Physical Medicine & Rehabilitation

## 2017-07-13 VITALS — BP 135/83 | HR 69 | Resp 14

## 2017-07-13 DIAGNOSIS — Z800 Family history of malignant neoplasm of digestive organs: Secondary | ICD-10-CM | POA: Insufficient documentation

## 2017-07-13 DIAGNOSIS — M545 Low back pain, unspecified: Secondary | ICD-10-CM

## 2017-07-13 DIAGNOSIS — G894 Chronic pain syndrome: Secondary | ICD-10-CM | POA: Diagnosis not present

## 2017-07-13 DIAGNOSIS — G4733 Obstructive sleep apnea (adult) (pediatric): Secondary | ICD-10-CM | POA: Diagnosis not present

## 2017-07-13 DIAGNOSIS — R269 Unspecified abnormalities of gait and mobility: Secondary | ICD-10-CM | POA: Diagnosis not present

## 2017-07-13 DIAGNOSIS — E538 Deficiency of other specified B group vitamins: Secondary | ICD-10-CM | POA: Insufficient documentation

## 2017-07-13 DIAGNOSIS — Z803 Family history of malignant neoplasm of breast: Secondary | ICD-10-CM | POA: Insufficient documentation

## 2017-07-13 DIAGNOSIS — Z833 Family history of diabetes mellitus: Secondary | ICD-10-CM | POA: Insufficient documentation

## 2017-07-13 DIAGNOSIS — R413 Other amnesia: Secondary | ICD-10-CM | POA: Insufficient documentation

## 2017-07-13 DIAGNOSIS — Z8261 Family history of arthritis: Secondary | ICD-10-CM | POA: Insufficient documentation

## 2017-07-13 DIAGNOSIS — E785 Hyperlipidemia, unspecified: Secondary | ICD-10-CM | POA: Diagnosis not present

## 2017-07-13 DIAGNOSIS — Z853 Personal history of malignant neoplasm of breast: Secondary | ICD-10-CM | POA: Diagnosis not present

## 2017-07-13 DIAGNOSIS — I129 Hypertensive chronic kidney disease with stage 1 through stage 4 chronic kidney disease, or unspecified chronic kidney disease: Secondary | ICD-10-CM | POA: Insufficient documentation

## 2017-07-13 DIAGNOSIS — N183 Chronic kidney disease, stage 3 (moderate): Secondary | ICD-10-CM | POA: Insufficient documentation

## 2017-07-13 DIAGNOSIS — F329 Major depressive disorder, single episode, unspecified: Secondary | ICD-10-CM | POA: Diagnosis not present

## 2017-07-13 DIAGNOSIS — G8929 Other chronic pain: Secondary | ICD-10-CM | POA: Diagnosis not present

## 2017-07-13 DIAGNOSIS — M797 Fibromyalgia: Secondary | ICD-10-CM | POA: Insufficient documentation

## 2017-07-13 DIAGNOSIS — M549 Dorsalgia, unspecified: Secondary | ICD-10-CM | POA: Diagnosis present

## 2017-07-13 DIAGNOSIS — M791 Myalgia, unspecified site: Secondary | ICD-10-CM

## 2017-07-13 DIAGNOSIS — Z823 Family history of stroke: Secondary | ICD-10-CM | POA: Diagnosis not present

## 2017-07-13 DIAGNOSIS — Z86711 Personal history of pulmonary embolism: Secondary | ICD-10-CM | POA: Insufficient documentation

## 2017-07-13 DIAGNOSIS — F419 Anxiety disorder, unspecified: Secondary | ICD-10-CM | POA: Insufficient documentation

## 2017-07-13 DIAGNOSIS — G250 Essential tremor: Secondary | ICD-10-CM | POA: Insufficient documentation

## 2017-07-13 DIAGNOSIS — I4891 Unspecified atrial fibrillation: Secondary | ICD-10-CM | POA: Diagnosis not present

## 2017-07-13 DIAGNOSIS — R569 Unspecified convulsions: Secondary | ICD-10-CM | POA: Diagnosis not present

## 2017-07-13 DIAGNOSIS — Z8249 Family history of ischemic heart disease and other diseases of the circulatory system: Secondary | ICD-10-CM | POA: Diagnosis not present

## 2017-07-13 DIAGNOSIS — G479 Sleep disorder, unspecified: Secondary | ICD-10-CM | POA: Insufficient documentation

## 2017-07-13 MED ORDER — AMITRIPTYLINE HCL 10 MG PO TABS: 10.0000 mg | ORAL_TABLET | Freq: Every day | ORAL | 1 refills | Status: DC

## 2017-07-13 MED ORDER — GABAPENTIN 600 MG PO TABS: 600.0000 mg | ORAL_TABLET | Freq: Three times a day (TID) | ORAL | 1 refills | Status: DC

## 2017-07-13 MED ORDER — DICLOFENAC SODIUM 1 % TD GEL: 2.0000 g | Freq: Four times a day (QID) | TRANSDERMAL | 1 refills | Status: DC

## 2017-07-13 MED ORDER — METHOCARBAMOL 500 MG PO TABS: 500.0000 mg | ORAL_TABLET | Freq: Three times a day (TID) | ORAL | 1 refills | Status: DC | PRN
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
## 2017-07-14 ENCOUNTER — Ambulatory Visit: Payer: Medicare Other | Admitting: Pulmonary Disease

## 2017-07-18 ENCOUNTER — Telehealth: Payer: Self-pay | Admitting: *Deleted

## 2017-07-18 NOTE — Telephone Encounter (Signed)
Mrs Gibbs says that the voltaren gel caused her to feel short of breath after using it so she has stopped it.  She says the amitriptyline taken at night makes her so drowsy and if she gets up to go to the bathroom she almost falls.  The methocarbamol is not helping her back.  Please advise. (the hydrocodone at least helped her back)

## 2017-07-18 NOTE — Telephone Encounter (Signed)
That is a surprising systemic side effect after 1 applications.  She may consider retrying it.  She should stop taking the Amitriptyline.  She may double the dose of Methocarbamol.  She should also follow up with TENS unit, pool therapy, and evaluation of leg length discrepancy as these interventions may provide considerable relief. Thanks.

## 2017-07-18 NOTE — Telephone Encounter (Signed)
She tried the voltaren 3 x. She will follow up on the therapies but she does not know anything about the TENS unit.

## 2017-07-19 MED ORDER — METHOCARBAMOL 500 MG PO TABS: 1000.0000 mg | ORAL_TABLET | Freq: Three times a day (TID) | ORAL | 0 refills | Status: DC | PRN

## 2017-07-19 NOTE — Telephone Encounter (Signed)
Will follow up on TENS unti.

## 2017-07-19 NOTE — Addendum Note (Signed)
Addended by: Caro Hight on: 07/19/2017 03:13 PM   Modules accepted: Orders

## 2017-07-20 NOTE — Telephone Encounter (Signed)
New TENS order signed and faxed, contacted patient and notified.  :Patient verbalized understanding

## 2017-07-25 ENCOUNTER — Ambulatory Visit: Payer: Medicare Other | Admitting: Pulmonary Disease

## 2017-07-28 DIAGNOSIS — M2560 Stiffness of unspecified joint, not elsewhere classified: Secondary | ICD-10-CM | POA: Diagnosis not present

## 2017-07-28 DIAGNOSIS — M6281 Muscle weakness (generalized): Secondary | ICD-10-CM | POA: Diagnosis not present

## 2017-07-28 DIAGNOSIS — M545 Low back pain: Secondary | ICD-10-CM | POA: Diagnosis not present

## 2017-07-28 DIAGNOSIS — R293 Abnormal posture: Secondary | ICD-10-CM | POA: Diagnosis not present

## 2017-07-31 DIAGNOSIS — M545 Low back pain: Secondary | ICD-10-CM | POA: Diagnosis not present

## 2017-07-31 DIAGNOSIS — M2560 Stiffness of unspecified joint, not elsewhere classified: Secondary | ICD-10-CM | POA: Diagnosis not present

## 2017-07-31 DIAGNOSIS — M6281 Muscle weakness (generalized): Secondary | ICD-10-CM | POA: Diagnosis not present

## 2017-07-31 DIAGNOSIS — R293 Abnormal posture: Secondary | ICD-10-CM | POA: Diagnosis not present

## 2017-08-02 DIAGNOSIS — M545 Low back pain: Secondary | ICD-10-CM | POA: Diagnosis not present

## 2017-08-02 DIAGNOSIS — R293 Abnormal posture: Secondary | ICD-10-CM | POA: Diagnosis not present

## 2017-08-02 DIAGNOSIS — M2560 Stiffness of unspecified joint, not elsewhere classified: Secondary | ICD-10-CM | POA: Diagnosis not present

## 2017-08-02 DIAGNOSIS — M6281 Muscle weakness (generalized): Secondary | ICD-10-CM | POA: Diagnosis not present

## 2017-08-07 DIAGNOSIS — M6281 Muscle weakness (generalized): Secondary | ICD-10-CM | POA: Diagnosis not present

## 2017-08-07 DIAGNOSIS — M2560 Stiffness of unspecified joint, not elsewhere classified: Secondary | ICD-10-CM | POA: Diagnosis not present

## 2017-08-07 DIAGNOSIS — R293 Abnormal posture: Secondary | ICD-10-CM | POA: Diagnosis not present

## 2017-08-07 DIAGNOSIS — M545 Low back pain: Secondary | ICD-10-CM | POA: Diagnosis not present

## 2017-08-09 DIAGNOSIS — M545 Low back pain: Secondary | ICD-10-CM | POA: Diagnosis not present

## 2017-08-09 DIAGNOSIS — M6281 Muscle weakness (generalized): Secondary | ICD-10-CM | POA: Diagnosis not present

## 2017-08-09 DIAGNOSIS — R293 Abnormal posture: Secondary | ICD-10-CM | POA: Diagnosis not present

## 2017-08-09 DIAGNOSIS — M2560 Stiffness of unspecified joint, not elsewhere classified: Secondary | ICD-10-CM | POA: Diagnosis not present

## 2017-08-10 ENCOUNTER — Encounter: Payer: Medicare Other | Attending: Physical Medicine & Rehabilitation | Admitting: Physical Medicine & Rehabilitation

## 2017-08-10 ENCOUNTER — Encounter: Payer: Self-pay | Admitting: Physical Medicine & Rehabilitation

## 2017-08-10 VITALS — BP 116/69 | HR 67 | Resp 14

## 2017-08-10 DIAGNOSIS — M2170 Unequal limb length (acquired), unspecified site: Secondary | ICD-10-CM

## 2017-08-10 DIAGNOSIS — Z86711 Personal history of pulmonary embolism: Secondary | ICD-10-CM | POA: Diagnosis not present

## 2017-08-10 DIAGNOSIS — I4891 Unspecified atrial fibrillation: Secondary | ICD-10-CM | POA: Diagnosis not present

## 2017-08-10 DIAGNOSIS — E538 Deficiency of other specified B group vitamins: Secondary | ICD-10-CM | POA: Diagnosis not present

## 2017-08-10 DIAGNOSIS — G250 Essential tremor: Secondary | ICD-10-CM | POA: Insufficient documentation

## 2017-08-10 DIAGNOSIS — F419 Anxiety disorder, unspecified: Secondary | ICD-10-CM | POA: Diagnosis not present

## 2017-08-10 DIAGNOSIS — G4733 Obstructive sleep apnea (adult) (pediatric): Secondary | ICD-10-CM | POA: Diagnosis not present

## 2017-08-10 DIAGNOSIS — Z8261 Family history of arthritis: Secondary | ICD-10-CM | POA: Insufficient documentation

## 2017-08-10 DIAGNOSIS — N183 Chronic kidney disease, stage 3 (moderate): Secondary | ICD-10-CM | POA: Diagnosis not present

## 2017-08-10 DIAGNOSIS — Z800 Family history of malignant neoplasm of digestive organs: Secondary | ICD-10-CM | POA: Insufficient documentation

## 2017-08-10 DIAGNOSIS — M791 Myalgia, unspecified site: Secondary | ICD-10-CM

## 2017-08-10 DIAGNOSIS — R569 Unspecified convulsions: Secondary | ICD-10-CM | POA: Insufficient documentation

## 2017-08-10 DIAGNOSIS — Z853 Personal history of malignant neoplasm of breast: Secondary | ICD-10-CM | POA: Insufficient documentation

## 2017-08-10 DIAGNOSIS — G479 Sleep disorder, unspecified: Secondary | ICD-10-CM | POA: Insufficient documentation

## 2017-08-10 DIAGNOSIS — M545 Low back pain, unspecified: Secondary | ICD-10-CM

## 2017-08-10 DIAGNOSIS — M549 Dorsalgia, unspecified: Secondary | ICD-10-CM | POA: Diagnosis present

## 2017-08-10 DIAGNOSIS — I129 Hypertensive chronic kidney disease with stage 1 through stage 4 chronic kidney disease, or unspecified chronic kidney disease: Secondary | ICD-10-CM | POA: Diagnosis not present

## 2017-08-10 DIAGNOSIS — Z803 Family history of malignant neoplasm of breast: Secondary | ICD-10-CM | POA: Insufficient documentation

## 2017-08-10 DIAGNOSIS — R413 Other amnesia: Secondary | ICD-10-CM | POA: Diagnosis not present

## 2017-08-10 DIAGNOSIS — G894 Chronic pain syndrome: Secondary | ICD-10-CM

## 2017-08-10 DIAGNOSIS — M797 Fibromyalgia: Secondary | ICD-10-CM | POA: Insufficient documentation

## 2017-08-10 DIAGNOSIS — Z823 Family history of stroke: Secondary | ICD-10-CM | POA: Insufficient documentation

## 2017-08-10 DIAGNOSIS — Z8249 Family history of ischemic heart disease and other diseases of the circulatory system: Secondary | ICD-10-CM | POA: Insufficient documentation

## 2017-08-10 DIAGNOSIS — E785 Hyperlipidemia, unspecified: Secondary | ICD-10-CM | POA: Diagnosis not present

## 2017-08-10 DIAGNOSIS — G8929 Other chronic pain: Secondary | ICD-10-CM | POA: Diagnosis not present

## 2017-08-10 DIAGNOSIS — R269 Unspecified abnormalities of gait and mobility: Secondary | ICD-10-CM

## 2017-08-10 DIAGNOSIS — Z833 Family history of diabetes mellitus: Secondary | ICD-10-CM | POA: Insufficient documentation

## 2017-08-10 DIAGNOSIS — F329 Major depressive disorder, single episode, unspecified: Secondary | ICD-10-CM | POA: Diagnosis not present
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
## 2017-08-14 DIAGNOSIS — M2560 Stiffness of unspecified joint, not elsewhere classified: Secondary | ICD-10-CM | POA: Diagnosis not present

## 2017-08-14 DIAGNOSIS — M6281 Muscle weakness (generalized): Secondary | ICD-10-CM | POA: Diagnosis not present

## 2017-08-14 DIAGNOSIS — M545 Low back pain: Secondary | ICD-10-CM | POA: Diagnosis not present

## 2017-08-14 DIAGNOSIS — R293 Abnormal posture: Secondary | ICD-10-CM | POA: Diagnosis not present

## 2017-08-15 ENCOUNTER — Ambulatory Visit (INDEPENDENT_AMBULATORY_CARE_PROVIDER_SITE_OTHER)
Admission: RE | Admit: 2017-08-15 | Discharge: 2017-08-15 | Disposition: A | Discharge status: Home / Self Care | Payer: Medicare Other | Source: Ambulatory Visit | Attending: Pulmonary Disease | Admitting: Pulmonary Disease

## 2017-08-15 ENCOUNTER — Ambulatory Visit (INDEPENDENT_AMBULATORY_CARE_PROVIDER_SITE_OTHER): Payer: Medicare Other | Admitting: Pulmonary Disease

## 2017-08-15 ENCOUNTER — Encounter: Payer: Self-pay | Admitting: Pulmonary Disease

## 2017-08-15 VITALS — BP 132/70 | HR 74 | Ht 70.0 in | Wt 227.8 lb

## 2017-08-15 DIAGNOSIS — I2699 Other pulmonary embolism without acute cor pulmonale: Secondary | ICD-10-CM

## 2017-08-15 DIAGNOSIS — J90 Pleural effusion, not elsewhere classified: Secondary | ICD-10-CM | POA: Diagnosis not present

## 2017-08-15 DIAGNOSIS — R0602 Shortness of breath: Secondary | ICD-10-CM | POA: Diagnosis not present

## 2017-08-15 DIAGNOSIS — R05 Cough: Secondary | ICD-10-CM | POA: Diagnosis not present
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
## 2017-08-15 NOTE — Patient Instructions (Signed)
   We are leaving your next appointment open-ended.  Please call if you have any new breathing problems or questions and we'll be happy to see you back.  We will let you know what the results of your x-ray is in the next 24 hours.  TESTS ORDERED: 1. CXR PA/LAT today

## 2017-08-16 DIAGNOSIS — M545 Low back pain: Secondary | ICD-10-CM | POA: Diagnosis not present

## 2017-08-16 DIAGNOSIS — M2560 Stiffness of unspecified joint, not elsewhere classified: Secondary | ICD-10-CM | POA: Diagnosis not present

## 2017-08-16 DIAGNOSIS — R293 Abnormal posture: Secondary | ICD-10-CM | POA: Diagnosis not present

## 2017-08-16 DIAGNOSIS — M6281 Muscle weakness (generalized): Secondary | ICD-10-CM | POA: Diagnosis not present

## 2017-08-21 DIAGNOSIS — M6281 Muscle weakness (generalized): Secondary | ICD-10-CM | POA: Diagnosis not present

## 2017-08-21 DIAGNOSIS — M2560 Stiffness of unspecified joint, not elsewhere classified: Secondary | ICD-10-CM | POA: Diagnosis not present

## 2017-08-21 DIAGNOSIS — R293 Abnormal posture: Secondary | ICD-10-CM | POA: Diagnosis not present

## 2017-08-21 DIAGNOSIS — M545 Low back pain: Secondary | ICD-10-CM | POA: Diagnosis not present

## 2017-08-23 DIAGNOSIS — R293 Abnormal posture: Secondary | ICD-10-CM | POA: Diagnosis not present

## 2017-08-23 DIAGNOSIS — M2560 Stiffness of unspecified joint, not elsewhere classified: Secondary | ICD-10-CM | POA: Diagnosis not present

## 2017-08-23 DIAGNOSIS — M545 Low back pain: Secondary | ICD-10-CM | POA: Diagnosis not present

## 2017-08-23 DIAGNOSIS — M6281 Muscle weakness (generalized): Secondary | ICD-10-CM | POA: Diagnosis not present

## 2017-08-28 DIAGNOSIS — M545 Low back pain: Secondary | ICD-10-CM | POA: Diagnosis not present

## 2017-08-28 DIAGNOSIS — M6281 Muscle weakness (generalized): Secondary | ICD-10-CM | POA: Diagnosis not present

## 2017-08-28 DIAGNOSIS — M2560 Stiffness of unspecified joint, not elsewhere classified: Secondary | ICD-10-CM | POA: Diagnosis not present

## 2017-08-28 DIAGNOSIS — R293 Abnormal posture: Secondary | ICD-10-CM | POA: Diagnosis not present

## 2017-08-30 DIAGNOSIS — M545 Low back pain: Secondary | ICD-10-CM | POA: Diagnosis not present

## 2017-08-30 DIAGNOSIS — R293 Abnormal posture: Secondary | ICD-10-CM | POA: Diagnosis not present

## 2017-08-30 DIAGNOSIS — M6281 Muscle weakness (generalized): Secondary | ICD-10-CM | POA: Diagnosis not present

## 2017-08-30 DIAGNOSIS — M2560 Stiffness of unspecified joint, not elsewhere classified: Secondary | ICD-10-CM | POA: Diagnosis not present

## 2017-09-06 DIAGNOSIS — M6281 Muscle weakness (generalized): Secondary | ICD-10-CM | POA: Diagnosis not present

## 2017-09-06 DIAGNOSIS — M2560 Stiffness of unspecified joint, not elsewhere classified: Secondary | ICD-10-CM | POA: Diagnosis not present

## 2017-09-06 DIAGNOSIS — M545 Low back pain: Secondary | ICD-10-CM | POA: Diagnosis not present

## 2017-09-06 DIAGNOSIS — R293 Abnormal posture: Secondary | ICD-10-CM | POA: Diagnosis not present
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
## 2017-09-08 ENCOUNTER — Ambulatory Visit (INDEPENDENT_AMBULATORY_CARE_PROVIDER_SITE_OTHER): Payer: Medicare Other | Admitting: Family Medicine

## 2017-09-08 ENCOUNTER — Encounter: Payer: Self-pay | Admitting: Family Medicine

## 2017-09-08 VITALS — BP 126/78 | HR 79 | Temp 97.7°F | Resp 16 | Ht 70.0 in | Wt 231.2 lb

## 2017-09-08 DIAGNOSIS — G8929 Other chronic pain: Secondary | ICD-10-CM | POA: Diagnosis not present

## 2017-09-08 DIAGNOSIS — M545 Low back pain, unspecified: Secondary | ICD-10-CM | POA: Insufficient documentation

## 2017-09-08 DIAGNOSIS — M6281 Muscle weakness (generalized): Secondary | ICD-10-CM | POA: Diagnosis not present

## 2017-09-08 DIAGNOSIS — M2560 Stiffness of unspecified joint, not elsewhere classified: Secondary | ICD-10-CM | POA: Diagnosis not present

## 2017-09-08 DIAGNOSIS — I129 Hypertensive chronic kidney disease with stage 1 through stage 4 chronic kidney disease, or unspecified chronic kidney disease: Secondary | ICD-10-CM | POA: Diagnosis not present

## 2017-09-08 DIAGNOSIS — R1031 Right lower quadrant pain: Secondary | ICD-10-CM | POA: Diagnosis not present

## 2017-09-08 DIAGNOSIS — K5900 Constipation, unspecified: Secondary | ICD-10-CM | POA: Diagnosis not present

## 2017-09-08 DIAGNOSIS — Z23 Encounter for immunization: Secondary | ICD-10-CM | POA: Diagnosis not present

## 2017-09-08 DIAGNOSIS — F411 Generalized anxiety disorder: Secondary | ICD-10-CM | POA: Diagnosis not present

## 2017-09-08 DIAGNOSIS — M797 Fibromyalgia: Secondary | ICD-10-CM | POA: Diagnosis not present

## 2017-09-08 DIAGNOSIS — R293 Abnormal posture: Secondary | ICD-10-CM | POA: Diagnosis not present

## 2017-09-08 DIAGNOSIS — N182 Chronic kidney disease, stage 2 (mild): Secondary | ICD-10-CM | POA: Diagnosis not present

## 2017-09-08 MED ORDER — DULOXETINE HCL 20 MG PO CPEP: 40.0000 mg | ORAL_CAPSULE | Freq: Every day | ORAL | 5 refills | Status: DC

## 2017-09-08 MED ORDER — LINACLOTIDE 145 MCG PO CAPS: 145.0000 ug | ORAL_CAPSULE | Freq: Every day | ORAL | 1 refills | Status: DC

## 2017-09-08 NOTE — Patient Instructions (Addendum)
A few things to remember from today's visit:   Benign hypertension with CKD (chronic kidney disease), stage II  Constipation, unspecified constipation type - Plan: linaclotide (LINZESS) 145 MCG CAPS capsule  Generalized anxiety disorder - Plan: DULoxetine (CYMBALTA) 20 MG capsule  Essential hypertension  Fibromyalgia - Plan: DULoxetine (CYMBALTA) 20 MG capsule  I think belly pain is not related to ovaries, it seems muscle related. Let me know if Linzess helps with constipation.    Please be sure medication list is accurate. If a new problem present, please set up appointment sooner than planned today.

## 2017-09-11 DIAGNOSIS — R293 Abnormal posture: Secondary | ICD-10-CM | POA: Diagnosis not present

## 2017-09-11 DIAGNOSIS — M6281 Muscle weakness (generalized): Secondary | ICD-10-CM | POA: Diagnosis not present

## 2017-09-11 DIAGNOSIS — M545 Low back pain: Secondary | ICD-10-CM | POA: Diagnosis not present

## 2017-09-11 DIAGNOSIS — M2560 Stiffness of unspecified joint, not elsewhere classified: Secondary | ICD-10-CM | POA: Diagnosis not present

## 2017-09-12 ENCOUNTER — Telehealth: Payer: Self-pay | Admitting: Family Medicine

## 2017-09-12 NOTE — Telephone Encounter (Signed)
° ° ° °  FYI   Pt said the below med is to expensive and she did not get it    linaclotide (LINZESS) 145 MCG CAPS capsule     Pt said she is doing miralax and is feeling better

## 2017-09-12 NOTE — Telephone Encounter (Signed)
Ok, so continue Miralax daily at night and OTC Bisacodyl 5 mg daily as needed.  Thanks, BJ

## 2017-09-13 DIAGNOSIS — R293 Abnormal posture: Secondary | ICD-10-CM | POA: Diagnosis not present

## 2017-09-13 DIAGNOSIS — M545 Low back pain: Secondary | ICD-10-CM | POA: Diagnosis not present

## 2017-09-13 DIAGNOSIS — M2560 Stiffness of unspecified joint, not elsewhere classified: Secondary | ICD-10-CM | POA: Diagnosis not present

## 2017-09-13 DIAGNOSIS — M6281 Muscle weakness (generalized): Secondary | ICD-10-CM | POA: Diagnosis not present

## 2017-09-14 ENCOUNTER — Encounter: Payer: Medicare Other | Attending: Physical Medicine & Rehabilitation | Admitting: Physical Medicine & Rehabilitation

## 2017-09-14 ENCOUNTER — Ambulatory Visit (HOSPITAL_COMMUNITY)
Admission: RE | Admit: 2017-09-14 | Discharge: 2017-09-14 | Disposition: A | Discharge status: Home / Self Care | Payer: Medicare Other | Source: Ambulatory Visit | Attending: Physical Medicine & Rehabilitation | Admitting: Physical Medicine & Rehabilitation

## 2017-09-14 ENCOUNTER — Encounter: Payer: Self-pay | Admitting: Physical Medicine & Rehabilitation

## 2017-09-14 VITALS — BP 133/80 | HR 69

## 2017-09-14 DIAGNOSIS — M791 Myalgia, unspecified site: Secondary | ICD-10-CM

## 2017-09-14 DIAGNOSIS — R569 Unspecified convulsions: Secondary | ICD-10-CM | POA: Insufficient documentation

## 2017-09-14 DIAGNOSIS — M2170 Unequal limb length (acquired), unspecified site: Secondary | ICD-10-CM | POA: Diagnosis not present

## 2017-09-14 DIAGNOSIS — Z8249 Family history of ischemic heart disease and other diseases of the circulatory system: Secondary | ICD-10-CM | POA: Insufficient documentation

## 2017-09-14 DIAGNOSIS — N183 Chronic kidney disease, stage 3 (moderate): Secondary | ICD-10-CM | POA: Diagnosis not present

## 2017-09-14 DIAGNOSIS — Z803 Family history of malignant neoplasm of breast: Secondary | ICD-10-CM | POA: Insufficient documentation

## 2017-09-14 DIAGNOSIS — R413 Other amnesia: Secondary | ICD-10-CM | POA: Diagnosis not present

## 2017-09-14 DIAGNOSIS — G4733 Obstructive sleep apnea (adult) (pediatric): Secondary | ICD-10-CM | POA: Diagnosis not present

## 2017-09-14 DIAGNOSIS — E785 Hyperlipidemia, unspecified: Secondary | ICD-10-CM | POA: Diagnosis not present

## 2017-09-14 DIAGNOSIS — E538 Deficiency of other specified B group vitamins: Secondary | ICD-10-CM | POA: Insufficient documentation

## 2017-09-14 DIAGNOSIS — M25561 Pain in right knee: Secondary | ICD-10-CM | POA: Diagnosis not present

## 2017-09-14 DIAGNOSIS — F329 Major depressive disorder, single episode, unspecified: Secondary | ICD-10-CM | POA: Insufficient documentation

## 2017-09-14 DIAGNOSIS — G894 Chronic pain syndrome: Secondary | ICD-10-CM | POA: Diagnosis not present

## 2017-09-14 DIAGNOSIS — M545 Low back pain, unspecified: Secondary | ICD-10-CM

## 2017-09-14 DIAGNOSIS — F419 Anxiety disorder, unspecified: Secondary | ICD-10-CM | POA: Insufficient documentation

## 2017-09-14 DIAGNOSIS — M11262 Other chondrocalcinosis, left knee: Secondary | ICD-10-CM | POA: Diagnosis not present

## 2017-09-14 DIAGNOSIS — Z823 Family history of stroke: Secondary | ICD-10-CM | POA: Diagnosis not present

## 2017-09-14 DIAGNOSIS — M797 Fibromyalgia: Secondary | ICD-10-CM | POA: Insufficient documentation

## 2017-09-14 DIAGNOSIS — R269 Unspecified abnormalities of gait and mobility: Secondary | ICD-10-CM | POA: Diagnosis not present

## 2017-09-14 DIAGNOSIS — I129 Hypertensive chronic kidney disease with stage 1 through stage 4 chronic kidney disease, or unspecified chronic kidney disease: Secondary | ICD-10-CM | POA: Diagnosis not present

## 2017-09-14 DIAGNOSIS — Z800 Family history of malignant neoplasm of digestive organs: Secondary | ICD-10-CM | POA: Insufficient documentation

## 2017-09-14 DIAGNOSIS — G479 Sleep disorder, unspecified: Secondary | ICD-10-CM | POA: Diagnosis not present

## 2017-09-14 DIAGNOSIS — Z853 Personal history of malignant neoplasm of breast: Secondary | ICD-10-CM | POA: Diagnosis not present

## 2017-09-14 DIAGNOSIS — Z86711 Personal history of pulmonary embolism: Secondary | ICD-10-CM | POA: Insufficient documentation

## 2017-09-14 DIAGNOSIS — Z833 Family history of diabetes mellitus: Secondary | ICD-10-CM | POA: Insufficient documentation

## 2017-09-14 DIAGNOSIS — G8929 Other chronic pain: Secondary | ICD-10-CM

## 2017-09-14 DIAGNOSIS — M11261 Other chondrocalcinosis, right knee: Secondary | ICD-10-CM | POA: Insufficient documentation

## 2017-09-14 DIAGNOSIS — I4891 Unspecified atrial fibrillation: Secondary | ICD-10-CM | POA: Diagnosis not present

## 2017-09-14 DIAGNOSIS — Z8261 Family history of arthritis: Secondary | ICD-10-CM | POA: Insufficient documentation

## 2017-09-14 DIAGNOSIS — M7989 Other specified soft tissue disorders: Secondary | ICD-10-CM | POA: Diagnosis not present

## 2017-09-14 DIAGNOSIS — M549 Dorsalgia, unspecified: Secondary | ICD-10-CM | POA: Diagnosis present

## 2017-09-14 DIAGNOSIS — M25562 Pain in left knee: Secondary | ICD-10-CM | POA: Diagnosis not present

## 2017-09-14 DIAGNOSIS — G250 Essential tremor: Secondary | ICD-10-CM | POA: Diagnosis not present

## 2017-09-14 MED ORDER — LIDOCAINE 5 % EX OINT: 1.0000 "application " | TOPICAL_OINTMENT | CUTANEOUS | 0 refills | Status: DC | PRN

## 2017-09-14 MED ORDER — GABAPENTIN 600 MG PO TABS: 900.0000 mg | ORAL_TABLET | Freq: Three times a day (TID) | ORAL | 1 refills | Status: DC

## 2017-09-14 MED ORDER — METHOCARBAMOL 750 MG PO TABS: 750.0000 mg | ORAL_TABLET | Freq: Four times a day (QID) | ORAL | 1 refills | Status: DC
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
## 2017-09-18 DIAGNOSIS — M6281 Muscle weakness (generalized): Secondary | ICD-10-CM | POA: Diagnosis not present

## 2017-09-18 DIAGNOSIS — M545 Low back pain: Secondary | ICD-10-CM | POA: Diagnosis not present

## 2017-09-18 DIAGNOSIS — R293 Abnormal posture: Secondary | ICD-10-CM | POA: Diagnosis not present

## 2017-09-18 DIAGNOSIS — M2560 Stiffness of unspecified joint, not elsewhere classified: Secondary | ICD-10-CM | POA: Diagnosis not present

## 2017-09-20 ENCOUNTER — Telehealth: Payer: Self-pay

## 2017-09-20 DIAGNOSIS — M545 Low back pain: Secondary | ICD-10-CM | POA: Diagnosis not present

## 2017-09-20 DIAGNOSIS — M2560 Stiffness of unspecified joint, not elsewhere classified: Secondary | ICD-10-CM | POA: Diagnosis not present

## 2017-09-20 DIAGNOSIS — M6281 Muscle weakness (generalized): Secondary | ICD-10-CM | POA: Diagnosis not present

## 2017-09-20 DIAGNOSIS — R293 Abnormal posture: Secondary | ICD-10-CM | POA: Diagnosis not present

## 2017-09-20 NOTE — Telephone Encounter (Signed)
Patient called wanting to know about her results of the knee x-rays that were taken last Thursday (09/14/17).

## 2017-09-20 NOTE — Telephone Encounter (Signed)
Called patient to update her on results.  Thanks.

## 2017-09-22 DIAGNOSIS — M2560 Stiffness of unspecified joint, not elsewhere classified: Secondary | ICD-10-CM | POA: Diagnosis not present

## 2017-09-22 DIAGNOSIS — M6281 Muscle weakness (generalized): Secondary | ICD-10-CM | POA: Diagnosis not present

## 2017-09-22 DIAGNOSIS — M545 Low back pain: Secondary | ICD-10-CM | POA: Diagnosis not present

## 2017-09-22 DIAGNOSIS — R293 Abnormal posture: Secondary | ICD-10-CM | POA: Diagnosis not present

## 2017-09-25 DIAGNOSIS — M545 Low back pain: Secondary | ICD-10-CM | POA: Diagnosis not present

## 2017-09-25 DIAGNOSIS — M2560 Stiffness of unspecified joint, not elsewhere classified: Secondary | ICD-10-CM | POA: Diagnosis not present

## 2017-09-25 DIAGNOSIS — M6281 Muscle weakness (generalized): Secondary | ICD-10-CM | POA: Diagnosis not present

## 2017-09-25 DIAGNOSIS — R293 Abnormal posture: Secondary | ICD-10-CM | POA: Diagnosis not present

## 2017-10-02 DIAGNOSIS — M6281 Muscle weakness (generalized): Secondary | ICD-10-CM | POA: Diagnosis not present

## 2017-10-02 DIAGNOSIS — M545 Low back pain: Secondary | ICD-10-CM | POA: Diagnosis not present

## 2017-10-02 DIAGNOSIS — M2560 Stiffness of unspecified joint, not elsewhere classified: Secondary | ICD-10-CM | POA: Diagnosis not present

## 2017-10-02 DIAGNOSIS — R293 Abnormal posture: Secondary | ICD-10-CM | POA: Diagnosis not present

## 2017-10-06 DIAGNOSIS — M545 Low back pain: Secondary | ICD-10-CM | POA: Diagnosis not present

## 2017-10-06 DIAGNOSIS — M2560 Stiffness of unspecified joint, not elsewhere classified: Secondary | ICD-10-CM | POA: Diagnosis not present

## 2017-10-06 DIAGNOSIS — M6281 Muscle weakness (generalized): Secondary | ICD-10-CM | POA: Diagnosis not present

## 2017-10-06 DIAGNOSIS — R293 Abnormal posture: Secondary | ICD-10-CM | POA: Diagnosis not present

## 2017-10-09 DIAGNOSIS — R293 Abnormal posture: Secondary | ICD-10-CM | POA: Diagnosis not present

## 2017-10-09 DIAGNOSIS — M6281 Muscle weakness (generalized): Secondary | ICD-10-CM | POA: Diagnosis not present

## 2017-10-09 DIAGNOSIS — M2560 Stiffness of unspecified joint, not elsewhere classified: Secondary | ICD-10-CM | POA: Diagnosis not present

## 2017-10-09 DIAGNOSIS — M545 Low back pain: Secondary | ICD-10-CM | POA: Diagnosis not present

## 2017-10-11 DIAGNOSIS — M6281 Muscle weakness (generalized): Secondary | ICD-10-CM | POA: Diagnosis not present

## 2017-10-11 DIAGNOSIS — M2560 Stiffness of unspecified joint, not elsewhere classified: Secondary | ICD-10-CM | POA: Diagnosis not present

## 2017-10-11 DIAGNOSIS — M545 Low back pain: Secondary | ICD-10-CM | POA: Diagnosis not present

## 2017-10-11 DIAGNOSIS — R293 Abnormal posture: Secondary | ICD-10-CM | POA: Diagnosis not present

## 2017-10-13 ENCOUNTER — Other Ambulatory Visit: Payer: Self-pay

## 2017-10-13 ENCOUNTER — Encounter: Payer: Self-pay | Admitting: Physical Medicine & Rehabilitation

## 2017-10-13 ENCOUNTER — Encounter: Payer: Medicare Other | Attending: Physical Medicine & Rehabilitation | Admitting: Physical Medicine & Rehabilitation

## 2017-10-13 VITALS — BP 141/81 | HR 68

## 2017-10-13 DIAGNOSIS — E538 Deficiency of other specified B group vitamins: Secondary | ICD-10-CM | POA: Diagnosis not present

## 2017-10-13 DIAGNOSIS — Z86711 Personal history of pulmonary embolism: Secondary | ICD-10-CM | POA: Insufficient documentation

## 2017-10-13 DIAGNOSIS — R569 Unspecified convulsions: Secondary | ICD-10-CM | POA: Diagnosis not present

## 2017-10-13 DIAGNOSIS — G4733 Obstructive sleep apnea (adult) (pediatric): Secondary | ICD-10-CM | POA: Diagnosis not present

## 2017-10-13 DIAGNOSIS — F329 Major depressive disorder, single episode, unspecified: Secondary | ICD-10-CM | POA: Diagnosis not present

## 2017-10-13 DIAGNOSIS — F419 Anxiety disorder, unspecified: Secondary | ICD-10-CM | POA: Insufficient documentation

## 2017-10-13 DIAGNOSIS — Z8249 Family history of ischemic heart disease and other diseases of the circulatory system: Secondary | ICD-10-CM | POA: Diagnosis not present

## 2017-10-13 DIAGNOSIS — M25561 Pain in right knee: Secondary | ICD-10-CM

## 2017-10-13 DIAGNOSIS — M545 Low back pain: Secondary | ICD-10-CM

## 2017-10-13 DIAGNOSIS — Z853 Personal history of malignant neoplasm of breast: Secondary | ICD-10-CM | POA: Insufficient documentation

## 2017-10-13 DIAGNOSIS — G479 Sleep disorder, unspecified: Secondary | ICD-10-CM | POA: Insufficient documentation

## 2017-10-13 DIAGNOSIS — Z8261 Family history of arthritis: Secondary | ICD-10-CM | POA: Diagnosis not present

## 2017-10-13 DIAGNOSIS — M797 Fibromyalgia: Secondary | ICD-10-CM | POA: Diagnosis not present

## 2017-10-13 DIAGNOSIS — G250 Essential tremor: Secondary | ICD-10-CM | POA: Diagnosis not present

## 2017-10-13 DIAGNOSIS — M2170 Unequal limb length (acquired), unspecified site: Secondary | ICD-10-CM | POA: Diagnosis not present

## 2017-10-13 DIAGNOSIS — R269 Unspecified abnormalities of gait and mobility: Secondary | ICD-10-CM | POA: Insufficient documentation

## 2017-10-13 DIAGNOSIS — R413 Other amnesia: Secondary | ICD-10-CM | POA: Diagnosis not present

## 2017-10-13 DIAGNOSIS — G894 Chronic pain syndrome: Secondary | ICD-10-CM

## 2017-10-13 DIAGNOSIS — I129 Hypertensive chronic kidney disease with stage 1 through stage 4 chronic kidney disease, or unspecified chronic kidney disease: Secondary | ICD-10-CM | POA: Diagnosis not present

## 2017-10-13 DIAGNOSIS — Z823 Family history of stroke: Secondary | ICD-10-CM | POA: Diagnosis not present

## 2017-10-13 DIAGNOSIS — M549 Dorsalgia, unspecified: Secondary | ICD-10-CM | POA: Diagnosis present

## 2017-10-13 DIAGNOSIS — M791 Myalgia, unspecified site: Secondary | ICD-10-CM | POA: Diagnosis not present

## 2017-10-13 DIAGNOSIS — G8929 Other chronic pain: Secondary | ICD-10-CM | POA: Insufficient documentation

## 2017-10-13 DIAGNOSIS — Z833 Family history of diabetes mellitus: Secondary | ICD-10-CM | POA: Insufficient documentation

## 2017-10-13 DIAGNOSIS — I4891 Unspecified atrial fibrillation: Secondary | ICD-10-CM | POA: Insufficient documentation

## 2017-10-13 DIAGNOSIS — N183 Chronic kidney disease, stage 3 (moderate): Secondary | ICD-10-CM | POA: Diagnosis not present

## 2017-10-13 DIAGNOSIS — Z803 Family history of malignant neoplasm of breast: Secondary | ICD-10-CM | POA: Diagnosis not present

## 2017-10-13 DIAGNOSIS — M25562 Pain in left knee: Secondary | ICD-10-CM | POA: Diagnosis not present

## 2017-10-13 DIAGNOSIS — E785 Hyperlipidemia, unspecified: Secondary | ICD-10-CM | POA: Diagnosis not present

## 2017-10-13 DIAGNOSIS — Z800 Family history of malignant neoplasm of digestive organs: Secondary | ICD-10-CM | POA: Insufficient documentation

## 2017-10-13 MED ORDER — DICLOFENAC SODIUM 2 % TD SOLN: TRANSDERMAL | 1 refills | Status: DC
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
## 2017-10-14 ENCOUNTER — Other Ambulatory Visit: Payer: Self-pay | Admitting: Family Medicine

## 2017-10-14 DIAGNOSIS — I4891 Unspecified atrial fibrillation: Secondary | ICD-10-CM

## 2017-10-18 ENCOUNTER — Telehealth: Payer: Self-pay

## 2017-10-18 DIAGNOSIS — M545 Low back pain: Secondary | ICD-10-CM | POA: Diagnosis not present

## 2017-10-18 DIAGNOSIS — M2560 Stiffness of unspecified joint, not elsewhere classified: Secondary | ICD-10-CM | POA: Diagnosis not present

## 2017-10-18 DIAGNOSIS — M6281 Muscle weakness (generalized): Secondary | ICD-10-CM | POA: Diagnosis not present

## 2017-10-18 DIAGNOSIS — R293 Abnormal posture: Secondary | ICD-10-CM | POA: Diagnosis not present

## 2017-10-18 MED ORDER — DICLOFENAC SODIUM 2 % TD SOLN: TRANSDERMAL | 1 refills | Status: DC

## 2017-10-18 NOTE — Telephone Encounter (Signed)
Recieved fax from [facility] requesting directions for usage of this medication  Pennsaid 2%  Please advise

## 2017-10-18 NOTE — Telephone Encounter (Signed)
Verbally consulted with dr patel, stated that directions should be   2 pumps to affected knee BID   Medication reordered and pharmacy contacted and transferred information.

## 2017-10-19 ENCOUNTER — Ambulatory Visit (INDEPENDENT_AMBULATORY_CARE_PROVIDER_SITE_OTHER): Payer: Medicare Other | Admitting: Internal Medicine

## 2017-10-19 ENCOUNTER — Encounter: Payer: Self-pay | Admitting: Internal Medicine

## 2017-10-19 ENCOUNTER — Other Ambulatory Visit: Payer: Self-pay | Admitting: *Deleted

## 2017-10-19 VITALS — BP 128/78 | HR 68 | Ht 69.0 in | Wt 227.2 lb

## 2017-10-19 DIAGNOSIS — M545 Low back pain: Secondary | ICD-10-CM | POA: Diagnosis not present

## 2017-10-19 DIAGNOSIS — Z86711 Personal history of pulmonary embolism: Secondary | ICD-10-CM

## 2017-10-19 DIAGNOSIS — I481 Persistent atrial fibrillation: Secondary | ICD-10-CM

## 2017-10-19 DIAGNOSIS — I10 Essential (primary) hypertension: Secondary | ICD-10-CM

## 2017-10-19 DIAGNOSIS — M79604 Pain in right leg: Secondary | ICD-10-CM | POA: Diagnosis not present

## 2017-10-19 DIAGNOSIS — M79605 Pain in left leg: Secondary | ICD-10-CM

## 2017-10-19 DIAGNOSIS — I4819 Other persistent atrial fibrillation: Secondary | ICD-10-CM

## 2017-10-19 DIAGNOSIS — R0609 Other forms of dyspnea: Secondary | ICD-10-CM | POA: Diagnosis not present

## 2017-10-19 DIAGNOSIS — G8929 Other chronic pain: Secondary | ICD-10-CM

## 2017-10-19 DIAGNOSIS — R0600 Dyspnea, unspecified: Secondary | ICD-10-CM

## 2017-10-19 MED ORDER — WARFARIN SODIUM 5 MG PO TABS: ORAL_TABLET | ORAL | 0 refills | Status: DC
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
## 2017-10-19 NOTE — Patient Instructions (Signed)
Medication Instructions:  Take both Xarelto 20 mg and warfarin (coumadin) 5 mg daily for a total of 3 days, that will be tonight (Thursday) , Friday, and Saturday.  Starting Sunday evening only take warfarin (coumadin ) 5 mg daily, do not take any more Xarelto.   Labwork: None   Testing/Procedures: None   Follow-Up: Your physician recommends that you schedule a follow-up appointment in: the CVRR on Tuesday October 24, 2017--it needs to be this day.  Your physician wants you to follow-up in: 6 months with Dr End. (June 2019).  You will receive a reminder letter in the mail two months in advance. If you don't receive a letter, please call our office to schedule the follow-up appointment.         If you need a refill on your cardiac medications before your next appointment, please call your pharmacy.

## 2017-10-20 ENCOUNTER — Encounter: Payer: Self-pay | Admitting: Internal Medicine

## 2017-10-20 DIAGNOSIS — R293 Abnormal posture: Secondary | ICD-10-CM | POA: Diagnosis not present

## 2017-10-20 DIAGNOSIS — M6281 Muscle weakness (generalized): Secondary | ICD-10-CM | POA: Diagnosis not present

## 2017-10-20 DIAGNOSIS — M2560 Stiffness of unspecified joint, not elsewhere classified: Secondary | ICD-10-CM | POA: Diagnosis not present

## 2017-10-20 DIAGNOSIS — M545 Low back pain: Secondary | ICD-10-CM | POA: Diagnosis not present

## 2017-10-20 DIAGNOSIS — Z86711 Personal history of pulmonary embolism: Secondary | ICD-10-CM | POA: Insufficient documentation

## 2017-10-24 ENCOUNTER — Ambulatory Visit (INDEPENDENT_AMBULATORY_CARE_PROVIDER_SITE_OTHER): Payer: Medicare Other | Admitting: Pharmacist

## 2017-10-24 DIAGNOSIS — Z7901 Long term (current) use of anticoagulants: Secondary | ICD-10-CM

## 2017-10-24 DIAGNOSIS — Z86711 Personal history of pulmonary embolism: Secondary | ICD-10-CM

## 2017-10-24 DIAGNOSIS — Z5181 Encounter for therapeutic drug level monitoring: Secondary | ICD-10-CM | POA: Diagnosis not present

## 2017-10-24 DIAGNOSIS — Z7189 Other specified counseling: Secondary | ICD-10-CM | POA: Insufficient documentation

## 2017-10-24 LAB — POCT INR: INR: 1.9

## 2017-10-24 NOTE — Patient Instructions (Signed)
Take 1.5 tablets today, then start taking 1 tablet daily except 1.5 tablets on Saturdays. Recheck INR in 1 week. Call Coumadin clinic with any concerns [phone].

## 2017-11-01 ENCOUNTER — Ambulatory Visit (INDEPENDENT_AMBULATORY_CARE_PROVIDER_SITE_OTHER): Payer: Medicare Other | Admitting: Pharmacist

## 2017-11-01 DIAGNOSIS — Z5181 Encounter for therapeutic drug level monitoring: Secondary | ICD-10-CM | POA: Diagnosis not present

## 2017-11-01 DIAGNOSIS — Z86711 Personal history of pulmonary embolism: Secondary | ICD-10-CM

## 2017-11-01 DIAGNOSIS — Z7901 Long term (current) use of anticoagulants: Secondary | ICD-10-CM | POA: Diagnosis not present

## 2017-11-01 LAB — POCT INR: INR: 4

## 2017-11-01 NOTE — Patient Instructions (Signed)
Description   No Coumadin today then start taking 1 tablet daily. Recheck INR in 1 week. Call Coumadin clinic with any concerns [phone].

## 2017-11-04 ENCOUNTER — Other Ambulatory Visit: Payer: Self-pay | Admitting: Neurology

## 2017-11-08 ENCOUNTER — Ambulatory Visit (INDEPENDENT_AMBULATORY_CARE_PROVIDER_SITE_OTHER): Payer: Medicare Other | Admitting: Pharmacist

## 2017-11-08 DIAGNOSIS — Z86711 Personal history of pulmonary embolism: Secondary | ICD-10-CM

## 2017-11-08 DIAGNOSIS — Z5181 Encounter for therapeutic drug level monitoring: Secondary | ICD-10-CM

## 2017-11-08 DIAGNOSIS — Z7901 Long term (current) use of anticoagulants: Secondary | ICD-10-CM | POA: Diagnosis not present

## 2017-11-08 LAB — POCT INR: INR: 3.6

## 2017-11-08 MED ORDER — WARFARIN SODIUM 5 MG PO TABS: ORAL_TABLET | ORAL | 1 refills | Status: DC

## 2017-11-08 NOTE — Patient Instructions (Signed)
Description   No Coumadin today then start taking 1 tablet daily except 1/2 tablet on Sundays. Recheck INR in 1 week. Call Coumadin clinic with any concerns [phone].

## 2017-11-16 ENCOUNTER — Ambulatory Visit (INDEPENDENT_AMBULATORY_CARE_PROVIDER_SITE_OTHER): Payer: Medicare Other | Admitting: *Deleted

## 2017-11-16 DIAGNOSIS — Z5181 Encounter for therapeutic drug level monitoring: Secondary | ICD-10-CM | POA: Diagnosis not present

## 2017-11-16 DIAGNOSIS — Z86711 Personal history of pulmonary embolism: Secondary | ICD-10-CM | POA: Diagnosis not present

## 2017-11-16 DIAGNOSIS — Z7901 Long term (current) use of anticoagulants: Secondary | ICD-10-CM | POA: Diagnosis not present

## 2017-11-16 LAB — POCT INR: INR: 2.8

## 2017-11-16 NOTE — Patient Instructions (Signed)
Description   Continue taking 1 tablet daily except 1/2 tablet on Sundays. Recheck INR in 1 week. Call Coumadin clinic with any concerns [phone].

## 2017-11-23 ENCOUNTER — Ambulatory Visit (INDEPENDENT_AMBULATORY_CARE_PROVIDER_SITE_OTHER): Payer: Medicare Other | Admitting: *Deleted

## 2017-11-23 ENCOUNTER — Encounter (INDEPENDENT_AMBULATORY_CARE_PROVIDER_SITE_OTHER): Payer: Self-pay

## 2017-11-23 DIAGNOSIS — Z86711 Personal history of pulmonary embolism: Secondary | ICD-10-CM

## 2017-11-23 DIAGNOSIS — Z7901 Long term (current) use of anticoagulants: Secondary | ICD-10-CM | POA: Diagnosis not present

## 2017-11-23 DIAGNOSIS — Z5181 Encounter for therapeutic drug level monitoring: Secondary | ICD-10-CM

## 2017-11-23 LAB — POCT INR: INR: 2.7

## 2017-11-23 NOTE — Patient Instructions (Signed)
Description   Continue taking 1 tablet daily except 1/2 tablet on Sundays. Recheck INR in 2 weeks. Call Coumadin clinic with any concerns [phone].

## 2017-11-24 ENCOUNTER — Encounter: Payer: Medicare Other | Attending: Physical Medicine & Rehabilitation | Admitting: Physical Medicine & Rehabilitation

## 2017-11-24 ENCOUNTER — Encounter: Payer: Self-pay | Admitting: Physical Medicine & Rehabilitation

## 2017-11-24 VITALS — BP 129/78 | HR 66 | Resp 14

## 2017-11-24 DIAGNOSIS — R569 Unspecified convulsions: Secondary | ICD-10-CM | POA: Diagnosis not present

## 2017-11-24 DIAGNOSIS — R269 Unspecified abnormalities of gait and mobility: Secondary | ICD-10-CM | POA: Diagnosis not present

## 2017-11-24 DIAGNOSIS — G479 Sleep disorder, unspecified: Secondary | ICD-10-CM | POA: Insufficient documentation

## 2017-11-24 DIAGNOSIS — Z8261 Family history of arthritis: Secondary | ICD-10-CM | POA: Diagnosis not present

## 2017-11-24 DIAGNOSIS — M545 Low back pain: Secondary | ICD-10-CM | POA: Diagnosis not present

## 2017-11-24 DIAGNOSIS — G894 Chronic pain syndrome: Secondary | ICD-10-CM | POA: Diagnosis not present

## 2017-11-24 DIAGNOSIS — G8929 Other chronic pain: Secondary | ICD-10-CM | POA: Insufficient documentation

## 2017-11-24 DIAGNOSIS — M25561 Pain in right knee: Secondary | ICD-10-CM | POA: Diagnosis not present

## 2017-11-24 DIAGNOSIS — Z8249 Family history of ischemic heart disease and other diseases of the circulatory system: Secondary | ICD-10-CM | POA: Insufficient documentation

## 2017-11-24 DIAGNOSIS — Z823 Family history of stroke: Secondary | ICD-10-CM | POA: Diagnosis not present

## 2017-11-24 DIAGNOSIS — N183 Chronic kidney disease, stage 3 (moderate): Secondary | ICD-10-CM | POA: Insufficient documentation

## 2017-11-24 DIAGNOSIS — M791 Myalgia, unspecified site: Secondary | ICD-10-CM

## 2017-11-24 DIAGNOSIS — Z803 Family history of malignant neoplasm of breast: Secondary | ICD-10-CM | POA: Insufficient documentation

## 2017-11-24 DIAGNOSIS — G4733 Obstructive sleep apnea (adult) (pediatric): Secondary | ICD-10-CM | POA: Insufficient documentation

## 2017-11-24 DIAGNOSIS — M2170 Unequal limb length (acquired), unspecified site: Secondary | ICD-10-CM | POA: Diagnosis not present

## 2017-11-24 DIAGNOSIS — G250 Essential tremor: Secondary | ICD-10-CM | POA: Insufficient documentation

## 2017-11-24 DIAGNOSIS — E785 Hyperlipidemia, unspecified: Secondary | ICD-10-CM | POA: Insufficient documentation

## 2017-11-24 DIAGNOSIS — M25562 Pain in left knee: Secondary | ICD-10-CM

## 2017-11-24 DIAGNOSIS — Z800 Family history of malignant neoplasm of digestive organs: Secondary | ICD-10-CM | POA: Insufficient documentation

## 2017-11-24 DIAGNOSIS — Z833 Family history of diabetes mellitus: Secondary | ICD-10-CM | POA: Insufficient documentation

## 2017-11-24 DIAGNOSIS — F419 Anxiety disorder, unspecified: Secondary | ICD-10-CM | POA: Diagnosis not present

## 2017-11-24 DIAGNOSIS — R413 Other amnesia: Secondary | ICD-10-CM | POA: Diagnosis not present

## 2017-11-24 DIAGNOSIS — M549 Dorsalgia, unspecified: Secondary | ICD-10-CM | POA: Diagnosis present

## 2017-11-24 DIAGNOSIS — F329 Major depressive disorder, single episode, unspecified: Secondary | ICD-10-CM | POA: Diagnosis not present

## 2017-11-24 DIAGNOSIS — E538 Deficiency of other specified B group vitamins: Secondary | ICD-10-CM | POA: Insufficient documentation

## 2017-11-24 DIAGNOSIS — M797 Fibromyalgia: Secondary | ICD-10-CM

## 2017-11-24 DIAGNOSIS — Z86711 Personal history of pulmonary embolism: Secondary | ICD-10-CM | POA: Diagnosis not present

## 2017-11-24 DIAGNOSIS — Z853 Personal history of malignant neoplasm of breast: Secondary | ICD-10-CM | POA: Insufficient documentation

## 2017-11-24 DIAGNOSIS — I129 Hypertensive chronic kidney disease with stage 1 through stage 4 chronic kidney disease, or unspecified chronic kidney disease: Secondary | ICD-10-CM | POA: Insufficient documentation

## 2017-11-24 DIAGNOSIS — I4891 Unspecified atrial fibrillation: Secondary | ICD-10-CM | POA: Insufficient documentation

## 2017-11-24 MED ORDER — GABAPENTIN 600 MG PO TABS: 1200.0000 mg | ORAL_TABLET | Freq: Three times a day (TID) | ORAL | 1 refills | Status: DC
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
## 2017-11-27 IMAGING — CT CT RENAL STONE PROTOCOL
2 of 4 series · 17 of 46 positions shown, 19 images · non-contrast
Comparison: None.

CLINICAL DATA: Moderate right-sided flank pain radiating below the
right breast, onset 6 p.m. yesterday.

EXAM:
CT ABDOMEN AND PELVIS WITHOUT CONTRAST
TECHNIQUE: Multidetector CT imaging of the abdomen and pelvis was performed
following the standard protocol without IV contrast.

[Series 2: renal stone 5mm · axial · 0.91mm/px · z∈[-560,-110]mm · 14 of 100 slices shown, 16 images]
[im 5/100  soft-tissue]
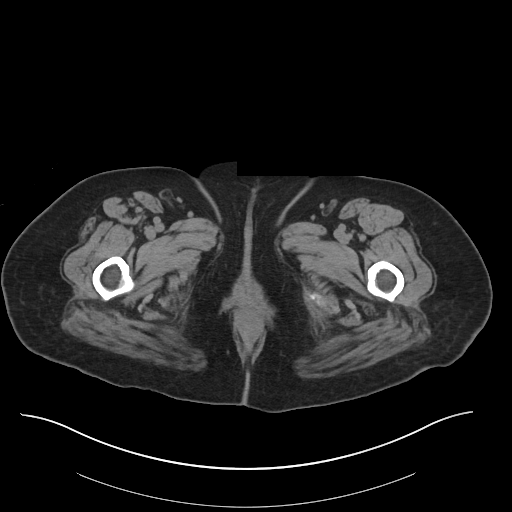
[im 5/100  bone]
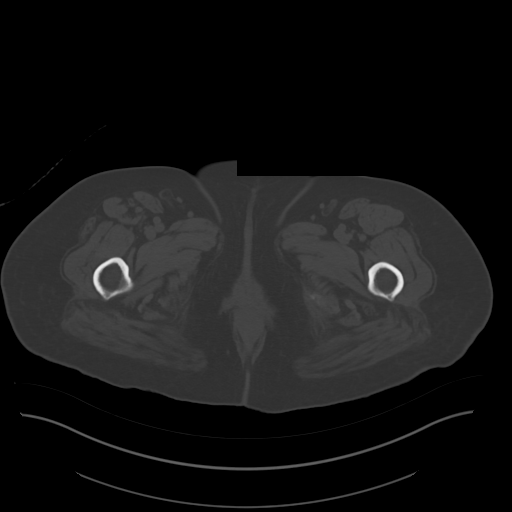
[im 13/100  soft-tissue]
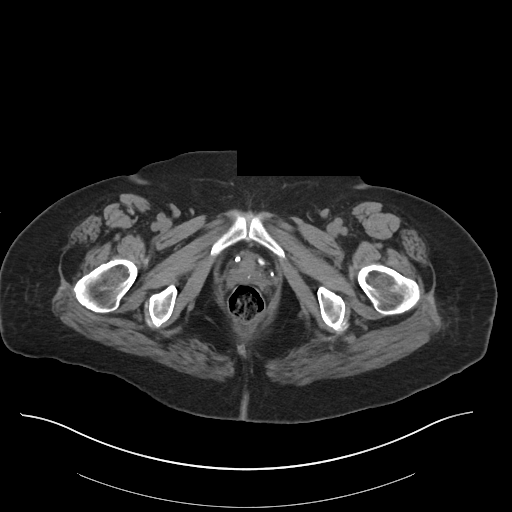
[im 18/100  soft-tissue]
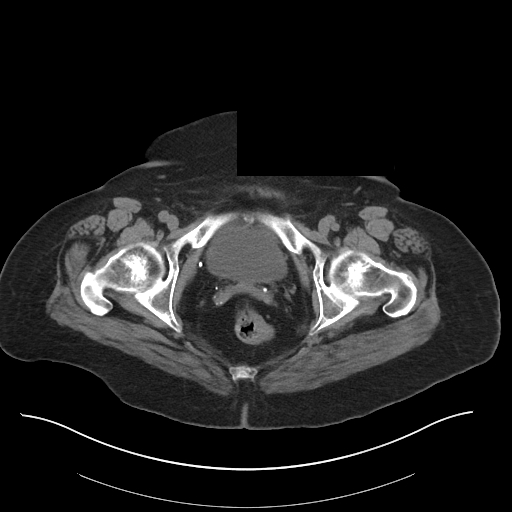
[im 26/100  soft-tissue]
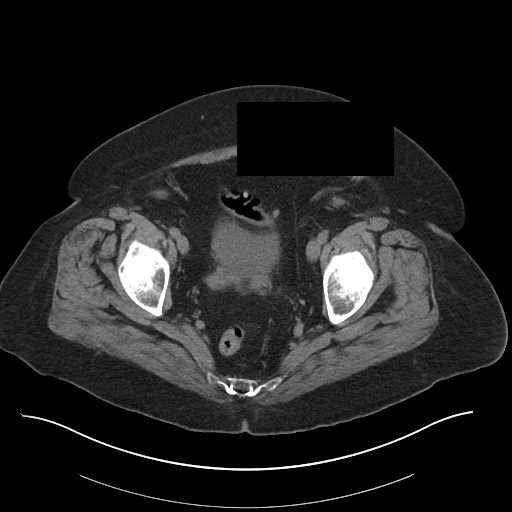
[im 35/100  soft-tissue]
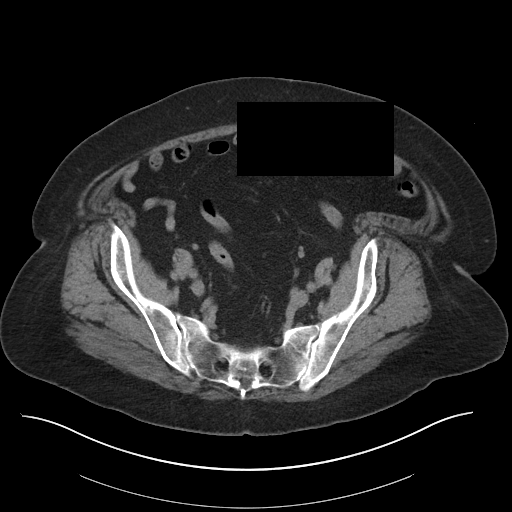
[im 39/100  soft-tissue]
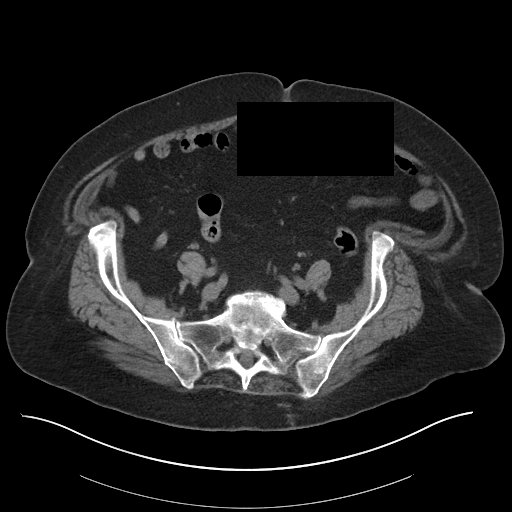
[im 48/100  soft-tissue]
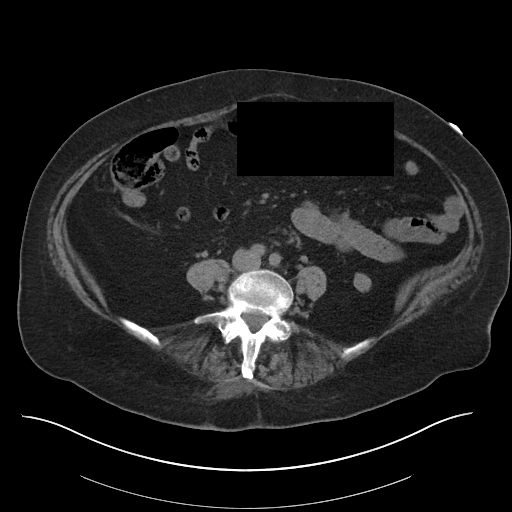
[im 52/100  soft-tissue]
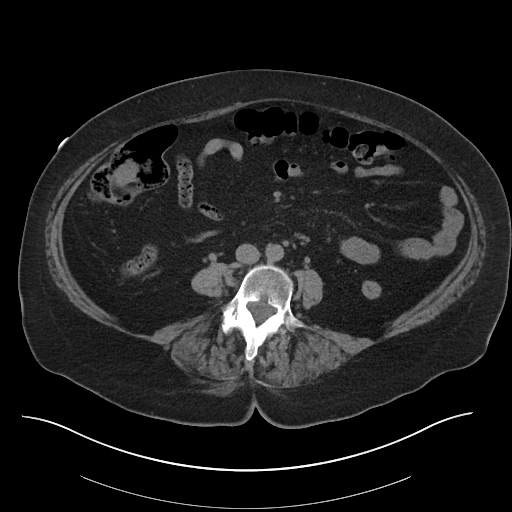
[im 61/100  soft-tissue]
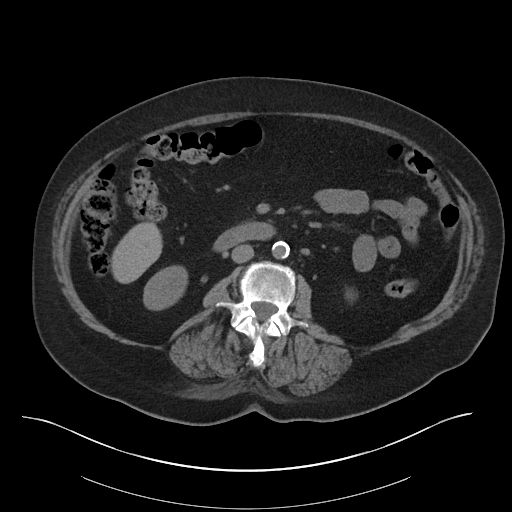
[im 61/100  bone]
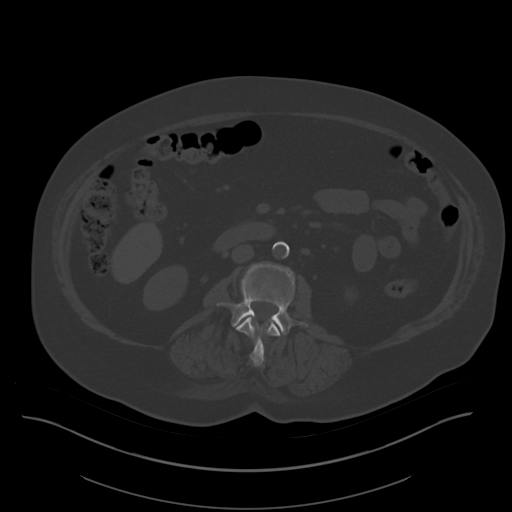
[im 65/100  soft-tissue]
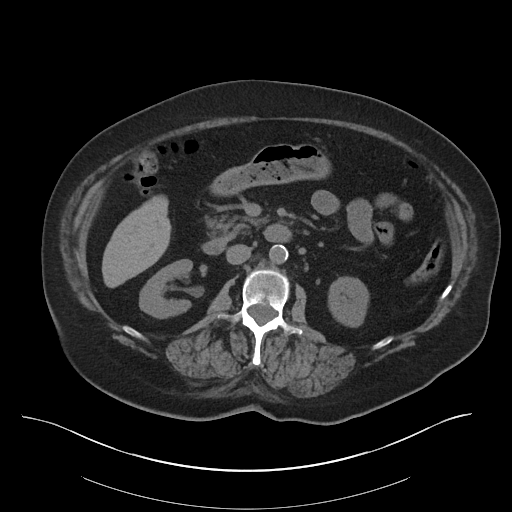
[im 74/100  soft-tissue]
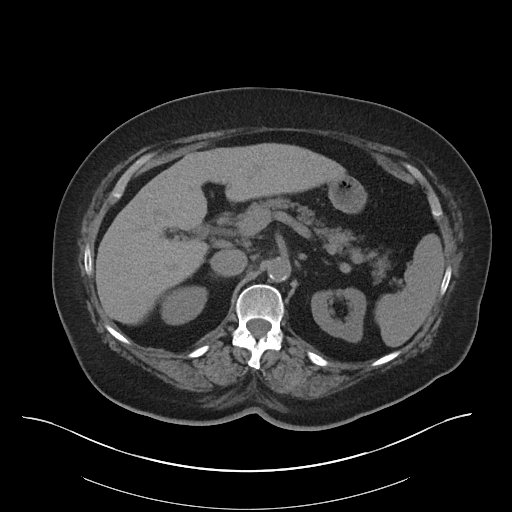
[im 82/100  soft-tissue]
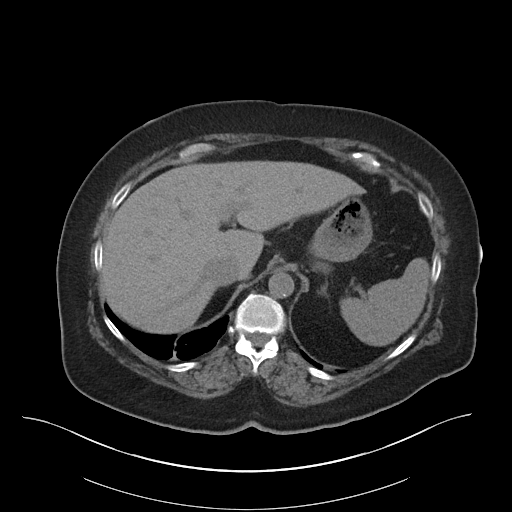
[im 87/100  soft-tissue]
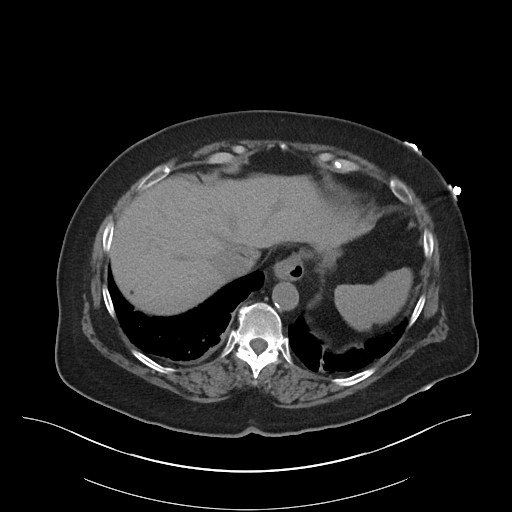
[im 95/100  soft-tissue]
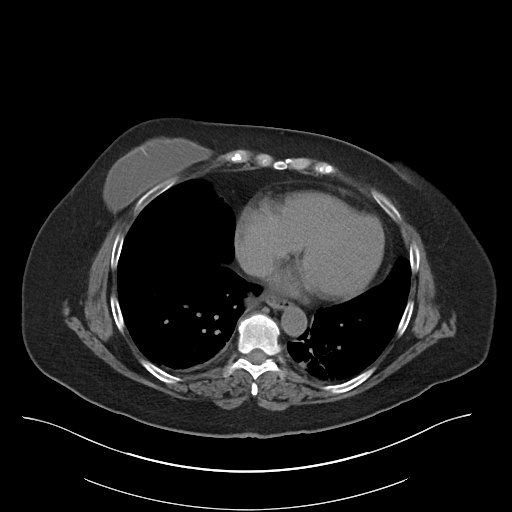

[Series 5: renal stone 3.0 cor · coronal · 0.88mm/px · 3 of 106 slices shown]
[im 36/106  soft-tissue]
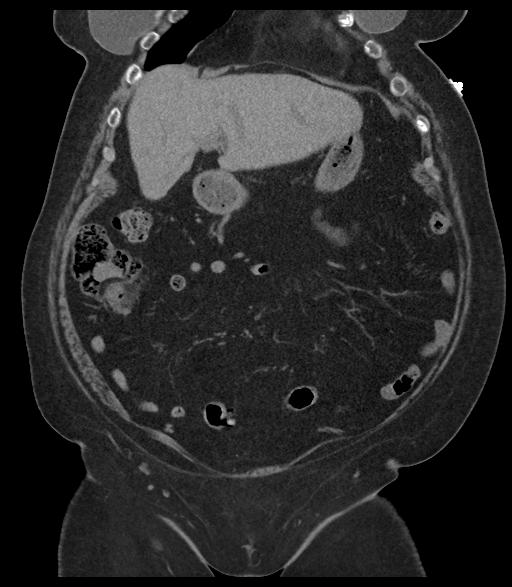
[im 47/106  soft-tissue]
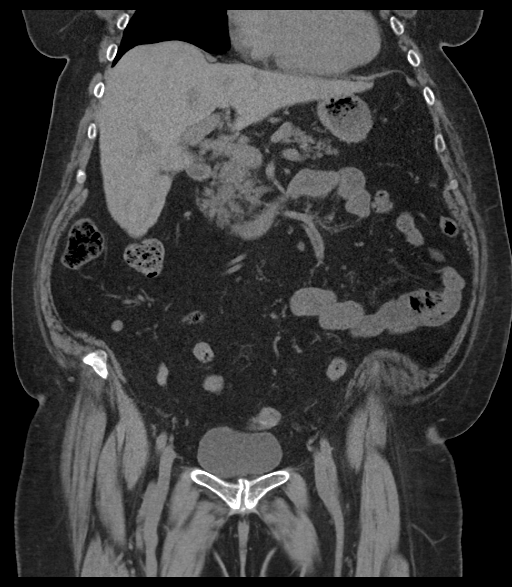
[im 59/106  soft-tissue]
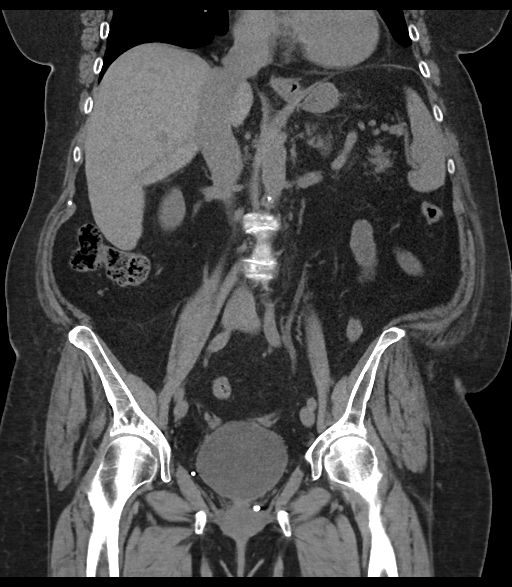

[17 of 46 positions shown; findings below may reference images not displayed]

FINDINGS: Atelectasis in the lung bases. Bilateral breast implants. Small
esophageal hiatal hernia.

The kidneys are symmetrical in size and shape. No hydronephrosis or
hydroureter. No renal, ureteral, or bladder stones. No bladder wall
thickening.

The unenhanced appearance of the liver, spleen, pancreas, adrenal
glands, abdominal aorta, inferior vena cava, and retroperitoneal
lymph nodes is unremarkable. Gallbladder is surgically absent. No
bile duct dilatation.

Stomach, small bowel, and colon are not abnormally distended. No
free air or free fluid in the abdomen. Abdominal wall musculature
appears intact.

Pelvis: Diverticulosis of the sigmoid colon. No evidence of
diverticulitis. No free or loculated pelvic fluid collections. No
pelvic mass or lymphadenopathy. Mild degenerative changes in the
spine.
IMPRESSION: No renal or ureteral stone or obstruction.

## 2017-12-01 ENCOUNTER — Ambulatory Visit (INDEPENDENT_AMBULATORY_CARE_PROVIDER_SITE_OTHER): Payer: Medicare Other | Admitting: Neurology

## 2017-12-01 ENCOUNTER — Encounter: Payer: Self-pay | Admitting: Neurology

## 2017-12-01 ENCOUNTER — Telehealth: Payer: Self-pay | Admitting: *Deleted

## 2017-12-01 VITALS — BP 125/74 | HR 73 | Ht 69.0 in | Wt 229.0 lb

## 2017-12-01 DIAGNOSIS — R413 Other amnesia: Secondary | ICD-10-CM

## 2017-12-01 DIAGNOSIS — G43009 Migraine without aura, not intractable, without status migrainosus: Secondary | ICD-10-CM | POA: Diagnosis not present

## 2017-12-01 DIAGNOSIS — M797 Fibromyalgia: Secondary | ICD-10-CM

## 2017-12-01 NOTE — Telephone Encounter (Signed)
Patient has some questions about Pennsaid.  Asking for a call back from clinical staff

## 2017-12-01 NOTE — Progress Notes (Signed)
Reason for visit: Fibromyalgia  Debbie [name] is an 72 y.o. female  History of present illness:  Debbie [name] is a 72 year old right-handed white female with a history of diffuse arthralgias and fibromyalgia type pain.  The patient has a history of migraine headaches.  She returns to the office today indicating that Dr. Posey Pronto from rehab is now following her for her pain.  He has taken her off of her opiate medication.  The gabapentin dose was increased to 1200 mg 3 times daily and she is on Robaxin 750 mg twice daily, these medications are causing a lot of drowsiness.  The patient has pain in the shoulders, thumbs, fingers, knees, and hips.  She was given a prescription for Pensaid, but she found that this medication was very expensive.  The patient is sleeping better on the medication.  She walks with a cane, she has been in aqua therapy.  Blood work done on the last visit to look for connective tissue disease processes was negative.  The patient is considering use of hemp oil.  Past Medical History:  Diagnosis Date  . Atrial fibrillation ([place])   . Benign essential tremor   . Breast cancer ([place])   . Chronic renal insufficiency   . Common migraine 05/07/2015  . Depression with anxiety   . Drowsiness    Excessive daytime  . Dyslipidemia   . Fibromyalgia   . History of partial seizures   . Hypertension   . Memory disturbance   . Muscle tension headache   . Obesity   . Obstructive sleep apnea   . S/P epidural steroid injection   . Seizures ([place])    Partial  . Sprain of neck 06/26/2013  . Tremors of nervous system   . Vitamin B12 deficiency     Past Surgical History:  Procedure Laterality Date  . ABDOMINAL HYSTERECTOMY    . ablation procedure     Cardiac, for Afib  . BASAL CELL CARCINOMA EXCISION    . BASAL CELL CARCINOMA EXCISION    . BRAIN MENINGIOMA EXCISION Right   . BREAST RECONSTRUCTION Bilateral   . breast reconstructive    . CHOLECYSTECTOMY    . DILATION AND  CURETTAGE OF UTERUS    . dilution    . ESOPHAGEAL MANOMETRY N/A 12/14/2016   Procedure: ESOPHAGEAL MANOMETRY (EM);  Surgeon: Mauri Pole, MD;  Location: [facility];  Service: Endoscopy;  Laterality: N/A;  . gallbladder resection    . LUMBAR SPINE SURGERY N/A   . lumbosacral    . mastectomies Bilateral   . NOSE SURGERY     Basal cell carcinoma resection from the nose  . TUBAL LIGATION N/A     Family History  Problem Relation Age of Onset  . Heart failure Mother   . Diabetes Mother   . Arthritis Mother   . Hyperlipidemia Mother   . Heart disease Mother   . Stroke Mother   . Hypertension Mother   . Cancer - Colon Father   . Colon cancer Father        late 11's  . Arthritis Father   . Cancer Father   . Breast cancer Sister   . Diabetes Maternal Grandfather   . Heart failure Maternal Grandfather   . Stroke Maternal Grandmother   . Lung disease Neg Hx     Social history:  reports that  has never smoked. she has never used smokeless tobacco. She reports that she does not drink alcohol or  use drugs.    Allergies  Allergen Reactions  . Diclofenac Shortness Of Breath  . Adhesive [Tape] Other (See Comments)    Peels skin off (bandaids, too)  . Atorvastatin Other (See Comments)  . Tramadol Other (See Comments) and Nausea And Vomiting  . Buprenorphine Hcl Nausea And Vomiting  . Codeine Nausea And Vomiting  . Fish Oil Nausea And Vomiting  . Morphine And Related Nausea And Vomiting    Medications:  Prior to Admission medications   Medication Sig Start Date End Date Taking? Authorizing Provider  acetaminophen (TYLENOL) 500 MG tablet Take 1,000 mg by mouth every 6 (six) hours as needed.   Yes [provider]  calcium carbonate (OS-CAL) 600 MG TABS tablet Take 600 mg by mouth daily with breakfast.   Yes [provider]  CARTIA XT 120 MG 24 hr capsule TAKE 1 CAPSULE BY MOUTH ONCE DAILY 10/16/17  Yes [name], [name], MD  diltiazem (CARDIZEM) 30 MG tablet  Take 1 tablet as needed for palpitations or heart rate greater than 110 02/10/17  Yes End, Harrell Gave, MD  DULoxetine (CYMBALTA) 20 MG capsule Take 2 capsules (40 mg total) by mouth daily. 09/08/17  Yes [name], [name], MD  ergocalciferol (VITAMIN D2) 50000 units capsule Take 1 capsule (50,000 Units total) by mouth once a week. 11/29/16  Yes [name], [name], MD  gabapentin (NEURONTIN) 600 MG tablet Take 2 tablets (1,200 mg total) by mouth 3 (three) times daily. 11/24/17 12/24/17 Yes Jamse Arn, MD  lamoTRIgine (LAMICTAL) 25 MG tablet TAKE 1 TABLET BY MOUTH TWICE DAILY 11/08/17  Yes Kathrynn Ducking, MD  methocarbamol (ROBAXIN-750) 750 MG tablet Take 1 tablet (750 mg total) 4 (four) times daily by mouth. Patient taking differently: Take 750 mg by mouth 2 (two) times daily.  09/14/17  Yes Jamse Arn, MD  promethazine (PHENERGAN) 25 MG tablet Take 1 tablet (25 mg total) by mouth every 6 (six) hours as needed for nausea or vomiting. 11/12/14  Yes Kathrynn Ducking, MD  warfarin (COUMADIN) 5 MG tablet Take 1 tablet by mouth daily or as directed 11/08/17  Yes End, Harrell Gave, MD  rivaroxaban (XARELTO) 20 MG TABS tablet Take 1 tablet (20 mg total) by mouth daily with supper. 12/22/16 10/19/17  End, Harrell Gave, MD    ROS:  Out of a complete 14 system review of symptoms, the patient complains only of the following symptoms, and all other reviewed systems are negative.  Fatigue Ringing in the ears, runny nose, difficulty swallowing Eye discharge, eye itching, light sensitivity Cough, shortness of breath Leg swelling, palpitations of the heart Cold intolerance, heat intolerance, flushing Abdominal pain, constipation, nausea, rectal pain Frequent waking, daytime sleepiness, snoring, sleep talking Environmental allergies Urinary urgency Joint pain, joint swelling, back pain, aching muscles, walking difficulty Skin rash, itching Bruising easily Memory loss, dizziness, headache, numbness, weakness,  tremors Anxiety  Blood pressure 125/74, pulse 73, height 5\' 9"  (1.753 m), weight 229 lb (103.9 kg).  Physical Exam  General: The patient is alert and cooperative at the time of the examination.  The patient is markedly obese.  Skin: 1+ edema at the ankles is noted bilaterally.   Neurologic Exam  Mental status: The patient is alert and oriented x 3 at the time of the examination. The patient has apparent normal recent and remote memory, with an apparently normal attention span and concentration ability.   Cranial nerves: Facial symmetry is present. Speech is normal, no aphasia or dysarthria is noted. Extraocular  movements are full. Visual fields are full.  Motor: The patient has good strength in all 4 extremities.  The patient has pain with use of the hands and thumbs, shoulders, knees and hips.  Sensory examination: Soft touch sensation is symmetric on the face, arms, and legs.  Coordination: The patient has good finger-nose-finger and heel-to-shin bilaterally.  Gait and station: The patient has a normal gait.  The patient usually uses a cane for ambulation.  Tandem gait is was not tested.  Romberg is negative. No drift is seen.  Reflexes: Deep tendon reflexes are symmetric.   Assessment/Plan:  1.  Fibromyalgia  2.  Polyarthralgias  3.  Migraine headache  The patient remains on low-dose Lamictal which she claims has helped her migraine headaches significantly.  We will continue this medication.  She is now followed by Dr. Posey Pronto for her chronic pain.  The patient may use hemp oil for her arthritic pain which may be reasonable.  The patient will follow-up through this office in 6 months.  Jill Alexanders MD 12/01/2017 10:35 AM  [facility] [address] [place], [place] [address]  Phone [phone] Fax [phone]

## 2017-12-04 NOTE — Telephone Encounter (Signed)
Debbie [name] called the insurance company and because she is on medicare the copay card will not help her.  She will just use what she has and will try and get back into physical therapy.

## 2017-12-04 NOTE — Telephone Encounter (Signed)
That is unfortunate. Thank you.

## 2017-12-07 ENCOUNTER — Ambulatory Visit (INDEPENDENT_AMBULATORY_CARE_PROVIDER_SITE_OTHER): Payer: Medicare Other | Admitting: *Deleted

## 2017-12-07 DIAGNOSIS — Z5181 Encounter for therapeutic drug level monitoring: Secondary | ICD-10-CM

## 2017-12-07 DIAGNOSIS — Z7901 Long term (current) use of anticoagulants: Secondary | ICD-10-CM | POA: Diagnosis not present

## 2017-12-07 DIAGNOSIS — Z86711 Personal history of pulmonary embolism: Secondary | ICD-10-CM

## 2017-12-07 LAB — POCT INR: INR: 2.4

## 2017-12-07 NOTE — Patient Instructions (Signed)
Description   Continue taking 1 tablet daily except 1/2 tablet on Sundays. Recheck INR in 3 weeks. Call Coumadin clinic with any concerns [phone].

## 2017-12-09 ENCOUNTER — Other Ambulatory Visit: Payer: Self-pay | Admitting: Physical Medicine & Rehabilitation

## 2017-12-21 ENCOUNTER — Encounter: Payer: Self-pay | Admitting: Physical Medicine & Rehabilitation

## 2017-12-21 ENCOUNTER — Encounter: Payer: Medicare Other | Attending: Physical Medicine & Rehabilitation | Admitting: Physical Medicine & Rehabilitation

## 2017-12-21 VITALS — BP 122/76 | HR 70

## 2017-12-21 DIAGNOSIS — I4891 Unspecified atrial fibrillation: Secondary | ICD-10-CM | POA: Insufficient documentation

## 2017-12-21 DIAGNOSIS — Z833 Family history of diabetes mellitus: Secondary | ICD-10-CM | POA: Insufficient documentation

## 2017-12-21 DIAGNOSIS — G250 Essential tremor: Secondary | ICD-10-CM | POA: Insufficient documentation

## 2017-12-21 DIAGNOSIS — E538 Deficiency of other specified B group vitamins: Secondary | ICD-10-CM | POA: Insufficient documentation

## 2017-12-21 DIAGNOSIS — R269 Unspecified abnormalities of gait and mobility: Secondary | ICD-10-CM | POA: Insufficient documentation

## 2017-12-21 DIAGNOSIS — R569 Unspecified convulsions: Secondary | ICD-10-CM | POA: Insufficient documentation

## 2017-12-21 DIAGNOSIS — G4733 Obstructive sleep apnea (adult) (pediatric): Secondary | ICD-10-CM | POA: Diagnosis not present

## 2017-12-21 DIAGNOSIS — G479 Sleep disorder, unspecified: Secondary | ICD-10-CM | POA: Diagnosis not present

## 2017-12-21 DIAGNOSIS — Z853 Personal history of malignant neoplasm of breast: Secondary | ICD-10-CM | POA: Diagnosis not present

## 2017-12-21 DIAGNOSIS — F419 Anxiety disorder, unspecified: Secondary | ICD-10-CM | POA: Diagnosis not present

## 2017-12-21 DIAGNOSIS — M791 Myalgia, unspecified site: Secondary | ICD-10-CM | POA: Diagnosis not present

## 2017-12-21 DIAGNOSIS — M545 Low back pain: Secondary | ICD-10-CM

## 2017-12-21 DIAGNOSIS — M25562 Pain in left knee: Secondary | ICD-10-CM | POA: Diagnosis not present

## 2017-12-21 DIAGNOSIS — N183 Chronic kidney disease, stage 3 (moderate): Secondary | ICD-10-CM | POA: Insufficient documentation

## 2017-12-21 DIAGNOSIS — Z803 Family history of malignant neoplasm of breast: Secondary | ICD-10-CM | POA: Diagnosis not present

## 2017-12-21 DIAGNOSIS — R413 Other amnesia: Secondary | ICD-10-CM | POA: Diagnosis not present

## 2017-12-21 DIAGNOSIS — M797 Fibromyalgia: Secondary | ICD-10-CM | POA: Diagnosis not present

## 2017-12-21 DIAGNOSIS — G8929 Other chronic pain: Secondary | ICD-10-CM | POA: Insufficient documentation

## 2017-12-21 DIAGNOSIS — Z8249 Family history of ischemic heart disease and other diseases of the circulatory system: Secondary | ICD-10-CM | POA: Insufficient documentation

## 2017-12-21 DIAGNOSIS — Z86711 Personal history of pulmonary embolism: Secondary | ICD-10-CM | POA: Diagnosis not present

## 2017-12-21 DIAGNOSIS — Z8261 Family history of arthritis: Secondary | ICD-10-CM | POA: Insufficient documentation

## 2017-12-21 DIAGNOSIS — M2170 Unequal limb length (acquired), unspecified site: Secondary | ICD-10-CM

## 2017-12-21 DIAGNOSIS — M549 Dorsalgia, unspecified: Secondary | ICD-10-CM | POA: Diagnosis present

## 2017-12-21 DIAGNOSIS — E785 Hyperlipidemia, unspecified: Secondary | ICD-10-CM | POA: Diagnosis not present

## 2017-12-21 DIAGNOSIS — M25561 Pain in right knee: Secondary | ICD-10-CM | POA: Diagnosis not present

## 2017-12-21 DIAGNOSIS — Z800 Family history of malignant neoplasm of digestive organs: Secondary | ICD-10-CM | POA: Insufficient documentation

## 2017-12-21 DIAGNOSIS — G894 Chronic pain syndrome: Secondary | ICD-10-CM | POA: Diagnosis not present

## 2017-12-21 DIAGNOSIS — Z823 Family history of stroke: Secondary | ICD-10-CM | POA: Insufficient documentation

## 2017-12-21 DIAGNOSIS — I129 Hypertensive chronic kidney disease with stage 1 through stage 4 chronic kidney disease, or unspecified chronic kidney disease: Secondary | ICD-10-CM | POA: Insufficient documentation

## 2017-12-21 DIAGNOSIS — F329 Major depressive disorder, single episode, unspecified: Secondary | ICD-10-CM | POA: Diagnosis not present
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
## 2017-12-27 ENCOUNTER — Ambulatory Visit (INDEPENDENT_AMBULATORY_CARE_PROVIDER_SITE_OTHER): Payer: Medicare Other

## 2017-12-27 DIAGNOSIS — Z5181 Encounter for therapeutic drug level monitoring: Secondary | ICD-10-CM

## 2017-12-27 DIAGNOSIS — Z86711 Personal history of pulmonary embolism: Secondary | ICD-10-CM

## 2017-12-27 DIAGNOSIS — I2782 Chronic pulmonary embolism: Secondary | ICD-10-CM | POA: Diagnosis not present

## 2017-12-27 DIAGNOSIS — Z7901 Long term (current) use of anticoagulants: Secondary | ICD-10-CM

## 2017-12-27 LAB — POCT INR: INR: 2.5

## 2017-12-27 NOTE — Patient Instructions (Signed)
Description   Continue on same dosage 1 tablet daily except 1/2 tablet on Sundays. Recheck INR in 4 weeks. Call Coumadin clinic with any concerns [phone].

## 2018-01-06 ENCOUNTER — Other Ambulatory Visit: Payer: Self-pay | Admitting: Internal Medicine

## 2018-01-08 NOTE — Telephone Encounter (Signed)
Refill Request.  

## 2018-01-24 ENCOUNTER — Telehealth: Payer: Self-pay | Admitting: Physical Medicine & Rehabilitation

## 2018-01-24 ENCOUNTER — Ambulatory Visit (INDEPENDENT_AMBULATORY_CARE_PROVIDER_SITE_OTHER): Payer: Medicare Other | Admitting: *Deleted

## 2018-01-24 DIAGNOSIS — Z86711 Personal history of pulmonary embolism: Secondary | ICD-10-CM | POA: Diagnosis not present

## 2018-01-24 DIAGNOSIS — Z5181 Encounter for therapeutic drug level monitoring: Secondary | ICD-10-CM | POA: Diagnosis not present

## 2018-01-24 DIAGNOSIS — I2782 Chronic pulmonary embolism: Secondary | ICD-10-CM

## 2018-01-24 DIAGNOSIS — Z7901 Long term (current) use of anticoagulants: Secondary | ICD-10-CM | POA: Diagnosis not present

## 2018-01-24 DIAGNOSIS — M47816 Spondylosis without myelopathy or radiculopathy, lumbar region: Secondary | ICD-10-CM

## 2018-01-24 LAB — POCT INR: INR: 2.1

## 2018-01-24 NOTE — Telephone Encounter (Signed)
We may make a referral.  Thanks. 

## 2018-01-24 NOTE — Telephone Encounter (Signed)
Pt presented requesting a referral to PT for [facility] back to [facility], Junious Silk. Please advise.

## 2018-01-24 NOTE — Patient Instructions (Signed)
Description   Continue on same dosage 1 tablet daily except 1/2 tablet on Sundays. Recheck INR in 5 weeks. Call Coumadin clinic with any concerns [phone].

## 2018-01-25 NOTE — Telephone Encounter (Signed)
Order placed, in process with Kathyrn Lass, referral coordinator

## 2018-01-25 NOTE — Addendum Note (Signed)
Addended by: Geryl Rankins D on: 01/25/2018 08:12 AM   Modules accepted: Orders

## 2018-02-10 ENCOUNTER — Other Ambulatory Visit: Payer: Self-pay | Admitting: Physical Medicine & Rehabilitation

## 2018-02-15 DIAGNOSIS — M6281 Muscle weakness (generalized): Secondary | ICD-10-CM | POA: Diagnosis not present

## 2018-02-15 DIAGNOSIS — M545 Low back pain: Secondary | ICD-10-CM | POA: Diagnosis not present

## 2018-02-15 DIAGNOSIS — M2560 Stiffness of unspecified joint, not elsewhere classified: Secondary | ICD-10-CM | POA: Diagnosis not present

## 2018-02-15 DIAGNOSIS — R293 Abnormal posture: Secondary | ICD-10-CM | POA: Diagnosis not present

## 2018-02-21 DIAGNOSIS — M6281 Muscle weakness (generalized): Secondary | ICD-10-CM | POA: Diagnosis not present

## 2018-02-21 DIAGNOSIS — M2560 Stiffness of unspecified joint, not elsewhere classified: Secondary | ICD-10-CM | POA: Diagnosis not present

## 2018-02-21 DIAGNOSIS — R293 Abnormal posture: Secondary | ICD-10-CM | POA: Diagnosis not present

## 2018-02-21 DIAGNOSIS — M545 Low back pain: Secondary | ICD-10-CM | POA: Diagnosis not present

## 2018-02-22 DIAGNOSIS — M545 Low back pain: Secondary | ICD-10-CM | POA: Diagnosis not present

## 2018-02-22 DIAGNOSIS — M6281 Muscle weakness (generalized): Secondary | ICD-10-CM | POA: Diagnosis not present

## 2018-02-22 DIAGNOSIS — R293 Abnormal posture: Secondary | ICD-10-CM | POA: Diagnosis not present

## 2018-02-22 DIAGNOSIS — M2560 Stiffness of unspecified joint, not elsewhere classified: Secondary | ICD-10-CM | POA: Diagnosis not present

## 2018-02-27 DIAGNOSIS — M545 Low back pain: Secondary | ICD-10-CM | POA: Diagnosis not present

## 2018-02-27 DIAGNOSIS — R293 Abnormal posture: Secondary | ICD-10-CM | POA: Diagnosis not present

## 2018-02-27 DIAGNOSIS — M6281 Muscle weakness (generalized): Secondary | ICD-10-CM | POA: Diagnosis not present

## 2018-02-27 DIAGNOSIS — M2560 Stiffness of unspecified joint, not elsewhere classified: Secondary | ICD-10-CM | POA: Diagnosis not present

## 2018-02-28 ENCOUNTER — Ambulatory Visit (INDEPENDENT_AMBULATORY_CARE_PROVIDER_SITE_OTHER): Payer: Medicare Other | Admitting: *Deleted

## 2018-02-28 DIAGNOSIS — Z86711 Personal history of pulmonary embolism: Secondary | ICD-10-CM | POA: Diagnosis not present

## 2018-02-28 DIAGNOSIS — I481 Persistent atrial fibrillation: Secondary | ICD-10-CM

## 2018-02-28 DIAGNOSIS — Z5181 Encounter for therapeutic drug level monitoring: Secondary | ICD-10-CM | POA: Diagnosis not present

## 2018-02-28 DIAGNOSIS — Z8679 Personal history of other diseases of the circulatory system: Secondary | ICD-10-CM

## 2018-02-28 DIAGNOSIS — Z9889 Other specified postprocedural states: Secondary | ICD-10-CM | POA: Diagnosis not present

## 2018-02-28 DIAGNOSIS — Z7901 Long term (current) use of anticoagulants: Secondary | ICD-10-CM | POA: Diagnosis not present

## 2018-02-28 DIAGNOSIS — I2782 Chronic pulmonary embolism: Secondary | ICD-10-CM

## 2018-02-28 DIAGNOSIS — I4819 Other persistent atrial fibrillation: Secondary | ICD-10-CM

## 2018-02-28 LAB — POCT INR: INR: 2.1

## 2018-02-28 NOTE — Patient Instructions (Signed)
Description   Continue on same dosage 1 tablet daily except 1/2 tablet on Sundays. Recheck INR in 6 weeks. Call Coumadin clinic with any concerns #[phone].     

## 2018-03-06 DIAGNOSIS — M2560 Stiffness of unspecified joint, not elsewhere classified: Secondary | ICD-10-CM | POA: Diagnosis not present

## 2018-03-06 DIAGNOSIS — M6281 Muscle weakness (generalized): Secondary | ICD-10-CM | POA: Diagnosis not present

## 2018-03-06 DIAGNOSIS — M545 Low back pain: Secondary | ICD-10-CM | POA: Diagnosis not present

## 2018-03-06 DIAGNOSIS — R293 Abnormal posture: Secondary | ICD-10-CM | POA: Diagnosis not present

## 2018-03-08 DIAGNOSIS — M545 Low back pain: Secondary | ICD-10-CM | POA: Diagnosis not present

## 2018-03-08 DIAGNOSIS — R293 Abnormal posture: Secondary | ICD-10-CM | POA: Diagnosis not present

## 2018-03-08 DIAGNOSIS — M6281 Muscle weakness (generalized): Secondary | ICD-10-CM | POA: Diagnosis not present

## 2018-03-08 DIAGNOSIS — M2560 Stiffness of unspecified joint, not elsewhere classified: Secondary | ICD-10-CM | POA: Diagnosis not present

## 2018-03-09 ENCOUNTER — Ambulatory Visit: Payer: Medicare Other | Admitting: Family Medicine

## 2018-03-10 ENCOUNTER — Other Ambulatory Visit: Payer: Self-pay | Admitting: Internal Medicine
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
## 2018-03-12 ENCOUNTER — Encounter: Payer: Self-pay | Admitting: Family Medicine

## 2018-03-12 ENCOUNTER — Ambulatory Visit (INDEPENDENT_AMBULATORY_CARE_PROVIDER_SITE_OTHER): Payer: Medicare Other | Admitting: Family Medicine

## 2018-03-12 VITALS — BP 125/83 | HR 64 | Temp 98.0°F | Resp 12 | Ht 69.0 in | Wt 230.2 lb

## 2018-03-12 DIAGNOSIS — M159 Polyosteoarthritis, unspecified: Secondary | ICD-10-CM

## 2018-03-12 DIAGNOSIS — F411 Generalized anxiety disorder: Secondary | ICD-10-CM | POA: Diagnosis not present

## 2018-03-12 DIAGNOSIS — M26621 Arthralgia of right temporomandibular joint: Secondary | ICD-10-CM | POA: Diagnosis not present

## 2018-03-12 DIAGNOSIS — M797 Fibromyalgia: Secondary | ICD-10-CM

## 2018-03-12 DIAGNOSIS — I10 Essential (primary) hypertension: Secondary | ICD-10-CM

## 2018-03-12 DIAGNOSIS — M545 Low back pain: Secondary | ICD-10-CM

## 2018-03-12 DIAGNOSIS — G8929 Other chronic pain: Secondary | ICD-10-CM | POA: Diagnosis not present

## 2018-03-12 MED ORDER — DULOXETINE HCL 20 MG PO CPEP: 40.0000 mg | ORAL_CAPSULE | Freq: Every day | ORAL | 1 refills | Status: DC

## 2018-03-12 NOTE — Patient Instructions (Addendum)
A few things to remember from today's visit:   Essential hypertension  Generalized anxiety disorder - Plan: DULoxetine (CYMBALTA) 20 MG capsule  Generalized osteoarthritis of multiple sites - Plan: Ambulatory referral to Orthopedic Surgery  Chronic bilateral low back pain, with sciatica presence unspecified - Plan: Ambulatory referral to Orthopedic Surgery  Arthralgia of right temporomandibular joint  Fibromyalgia - Plan: DULoxetine (CYMBALTA) 20 MG capsule  Continue following with Dr Posey Pronto for pain management.  No changes in current medications.  Right hip pain seems to be arthritis.  Please be sure medication list is accurate. If a new problem present, please set up appointment sooner than planned today.

## 2018-03-12 NOTE — Telephone Encounter (Signed)
Please review for refill. Thanks!  

## 2018-03-13 ENCOUNTER — Telehealth: Payer: Self-pay | Admitting: *Deleted

## 2018-03-13 DIAGNOSIS — M2560 Stiffness of unspecified joint, not elsewhere classified: Secondary | ICD-10-CM | POA: Diagnosis not present

## 2018-03-13 DIAGNOSIS — M6281 Muscle weakness (generalized): Secondary | ICD-10-CM | POA: Diagnosis not present

## 2018-03-13 DIAGNOSIS — M545 Low back pain: Secondary | ICD-10-CM | POA: Diagnosis not present

## 2018-03-13 DIAGNOSIS — R293 Abnormal posture: Secondary | ICD-10-CM | POA: Diagnosis not present

## 2018-03-13 NOTE — Telephone Encounter (Signed)
Copied from [place]. Topic: Referral - Request >> Mar 13, 2018  2:19 PM Antonieta Iba C wrote: Reason for CRM: pt is requesting referral to [facility] - fax: [phone] with Dr. Rhona Raider   >> Mar 13, 2018  2:23 PM Wynetta Emery, [place] C wrote: Pt says that she has discussed referral to ortho with PCP and was advised to look into a location and call in to request. Pt says that she has an apt on Monday and would like to have referral completed by then.

## 2018-03-13 NOTE — Telephone Encounter (Signed)
Copied from [place]. Topic: Referral - Request >> Mar 13, 2018  2:19 PM Antonieta Iba C wrote: Reason for CRM: pt is requesting referral to [facility] - fax: [phone] with Dr. Rhona Raider   >> Mar 13, 2018  2:23 PM Wynetta Emery, [place] C wrote: Pt says that she has discussed referral to ortho with PCP and was advised to look into a location and call in to request. Pt says that she has an apt on Monday and would like to have referral completed by then.

## 2018-03-13 NOTE — Telephone Encounter (Signed)
Reason for CRM: pt is requesting referral to [facility] - fax: [phone] with Dr. Rhona Raider  Pt says that she has discussed referral to ortho with PCP and was advised to look into a location and call in to request. Pt says that she has an apt on Monday and would like to have referral completed by then.

## 2018-03-15 DIAGNOSIS — R293 Abnormal posture: Secondary | ICD-10-CM | POA: Diagnosis not present

## 2018-03-15 DIAGNOSIS — M545 Low back pain: Secondary | ICD-10-CM | POA: Diagnosis not present

## 2018-03-15 DIAGNOSIS — M2560 Stiffness of unspecified joint, not elsewhere classified: Secondary | ICD-10-CM | POA: Diagnosis not present

## 2018-03-15 DIAGNOSIS — M6281 Muscle weakness (generalized): Secondary | ICD-10-CM | POA: Diagnosis not present

## 2018-03-19 DIAGNOSIS — M25562 Pain in left knee: Secondary | ICD-10-CM | POA: Diagnosis not present

## 2018-03-19 DIAGNOSIS — M25561 Pain in right knee: Secondary | ICD-10-CM | POA: Diagnosis not present

## 2018-03-19 DIAGNOSIS — M25552 Pain in left hip: Secondary | ICD-10-CM | POA: Diagnosis not present

## 2018-03-19 DIAGNOSIS — M25551 Pain in right hip: Secondary | ICD-10-CM | POA: Diagnosis not present

## 2018-03-21 ENCOUNTER — Encounter: Payer: Medicare Other | Admitting: Physical Medicine & Rehabilitation

## 2018-04-11 ENCOUNTER — Ambulatory Visit (INDEPENDENT_AMBULATORY_CARE_PROVIDER_SITE_OTHER): Payer: Medicare Other | Admitting: Pharmacist

## 2018-04-11 DIAGNOSIS — Z5181 Encounter for therapeutic drug level monitoring: Secondary | ICD-10-CM | POA: Diagnosis not present

## 2018-04-11 DIAGNOSIS — Z86711 Personal history of pulmonary embolism: Secondary | ICD-10-CM | POA: Diagnosis not present

## 2018-04-11 DIAGNOSIS — Z7901 Long term (current) use of anticoagulants: Secondary | ICD-10-CM | POA: Diagnosis not present

## 2018-04-11 DIAGNOSIS — I2782 Chronic pulmonary embolism: Secondary | ICD-10-CM | POA: Diagnosis not present

## 2018-04-11 LAB — POCT INR: INR: 2.8 (ref 2.0–3.0)

## 2018-04-11 NOTE — Patient Instructions (Signed)
Description   Continue on same dosage 1 tablet daily except 1/2 tablet on Sundays. Recheck INR in 6 weeks. Call Coumadin clinic with any concerns #[phone].     

## 2018-04-16 DIAGNOSIS — M25551 Pain in right hip: Secondary | ICD-10-CM | POA: Diagnosis not present

## 2018-04-16 DIAGNOSIS — M545 Low back pain: Secondary | ICD-10-CM | POA: Diagnosis not present

## 2018-04-18 ENCOUNTER — Other Ambulatory Visit: Payer: Self-pay | Admitting: Orthopaedic Surgery

## 2018-04-18 DIAGNOSIS — M545 Low back pain: Secondary | ICD-10-CM

## 2018-04-19 ENCOUNTER — Encounter: Payer: Self-pay | Admitting: Internal Medicine

## 2018-04-19 ENCOUNTER — Ambulatory Visit (INDEPENDENT_AMBULATORY_CARE_PROVIDER_SITE_OTHER): Payer: Medicare Other | Admitting: Internal Medicine

## 2018-04-19 VITALS — BP 138/62 | HR 60 | Ht 69.0 in | Wt 230.0 lb

## 2018-04-19 DIAGNOSIS — I481 Persistent atrial fibrillation: Secondary | ICD-10-CM | POA: Diagnosis not present

## 2018-04-19 DIAGNOSIS — I4819 Other persistent atrial fibrillation: Secondary | ICD-10-CM

## 2018-04-19 DIAGNOSIS — R002 Palpitations: Secondary | ICD-10-CM | POA: Diagnosis not present

## 2018-04-19 DIAGNOSIS — R0602 Shortness of breath: Secondary | ICD-10-CM

## 2018-04-19 DIAGNOSIS — R0789 Other chest pain: Secondary | ICD-10-CM | POA: Diagnosis not present

## 2018-04-19 NOTE — Patient Instructions (Addendum)
Medication Instructions:  Your physician recommends that you continue on your current medications as directed. Please refer to the Current Medication list given to you today.  -- If you need a refill on your cardiac medications before your next appointment, please call your pharmacy. --  Labwork:TODAY CBC/CMP/MAGNESIUM/TSH  Testing/Procedures:SCHEDULE AFTER 6/15  Your physician has recommended that you wear a 48 HOUR holter monitor. Holter monitors are medical devices that record the heart's electrical activity. Doctors most often use these monitors to diagnose arrhythmias. Arrhythmias are problems with the speed or rhythm of the heartbeat. The monitor is a small, portable device. You can wear one while you do your normal daily activities. This is usually used to diagnose what is causing palpitations/syncope (passing out).   Follow-Up: Your physician wants you to follow-up in: 1 MONTH with Dr. Saunders Revel or APP    Thank you for choosing [facility]!!    Any Other Special Instructions Will Be Listed Below (If Applicable).   Holter Monitoring A Holter monitor is a small device that is used to detect abnormal heart rhythms. It clips to your clothing and is connected by wires to flat, sticky disks (electrodes) that attach to your chest. It is worn continuously for 24-48 hours. Follow these instructions at home:  Wear your Holter monitor at all times, even while exercising and sleeping, for as long as directed by your health care provider.  Make sure that the Holter monitor is safely clipped to your clothing or close to your body as recommended by your health care provider.  Do not get the monitor or wires wet.  Do not put body lotion or moisturizer on your chest.  Keep your skin clean.  Keep a diary of your daily activities, such as walking and doing chores. If you feel that your heartbeat is abnormal or that your heart is fluttering or skipping a beat: ? Record what you are doing when  it happens. ? Record what time of day the symptoms occur.  Return your Holter monitor as directed by your health care provider.  Keep all follow-up visits as directed by your health care provider. This is important. Get help right away if:  You feel lightheaded or you faint.  You have trouble breathing.  You feel pain in your chest, upper arm, or jaw.  You feel sick to your stomach and your skin is pale, cool, or damp.  You heartbeat feels unusual or abnormal. This information is not intended to replace advice given to you by your health care provider. Make sure you discuss any questions you have with your health care provider. Document Released: 07/22/2004 Document Revised: 03/31/2016 Document Reviewed: 06/02/2014 Elsevier Interactive Patient Education  [employer].

## 2018-04-19 NOTE — Progress Notes (Signed)
Follow-up Outpatient Visit Date: 04/19/2018  Primary Care Provider: [name], [name], MD [address] [place] [address]  Chief Complaint: Palpitations  HPI:  Debbie [name] is a 72 y.o. year-old female with history of persistent atrial fibrillation status post cryoablation, pulmonary embolism (08/2016), hyperlipidemia, chronic kidney disease stage III, obesity, seizure disorder, sleep apnea, and fibromyalgia, who presents for follow-up of atrial fibrillation.  I last saw her in December at which time she reported stable dyspnea on exertion and sporadic palpitations.  She also noted continued chest wall soreness with tenderness to palpation attributed to fibromyalgia.  She was working with PM&R and physical therapy for her chronic pain; she was yet to see neurosurgery.  She asked to switch from rivaroxaban to warfarin due to cost, which has been managed by her anticoagulation clinic.  We did not make any other medication changes.  Today, Debbie [name] has several concerns.  Over the last ~3 weeks, she has experienced intermittent palpitations.  These are often accompanied by weakness, as if her blood sugar is low.  She has checked her sugars intermittent but never found it to be low (of note, she is not diabetic).  Symptoms seem to resolve after she drinks Coke.  She has also noted intermittent chest tightness, usually with the palpitations.  Episodes are happening on a daily basis.  She denies edema, orthopnea, and PND.  Debbie [name] is scheduled for lumbar spine MRI for further evaluation of her chronic back pain.  Since our last visit, she has undergone bilateral hip and knee steroid injections with significant improvement in her pain.  She is concerned that steroids could have brought on a-fib again.  She is tolerating anticoagulation with warfarin well.  She continues to take prn diltiazem 30 mg daily when her heart rate is > 70 bpm or she has persistent  palpitations.  --------------------------------------------------------------------------------------------------  Cardiovascular History & Procedures: Cardiovascular Problems:  Paroxysmal atrial fibrillation  Pulmonary embolism  Risk Factors:  Hyperlipidemia, obesity, and age > 110  Cath/PCI:  LHC(greater than 10 years ago in [place], [place]): Normal per patient's report.  CV Surgery:  None  EP Procedures and Devices:  30-day event monitor (12/29/16): Predominantly sinus rhythm without significant abnormalities, though quite a bit of artifact was noted.  Atrial fibrillation cryoablation (08/17/16, Dr. Minna Merritts, [facility])  Atrial fibrillation radiofrequency ablation (2014)  Non-Invasive Evaluation(s):  TTE (01/12/17): Normal LV size with mild LVH. LVEF 55-60% with grade 1 diastolic dysfunction. Moderate left atrial enlargement. Trivial pericardial effusion. Normal RV size and function. Normal CVP.  Pharmacologic myocardial perfusion stress test (12/29/16): Low-risk study with fixed mid and apical anterior/anteroseptal/septal defect most likely representing breast attenuation. Small basal inferolateral and basal anterolateral partially reversible defect that most likely represents shifting breast attenuation, though small area of ischemia cannot be excluded. LVEF 55-65%.  Limited TTE (09/13/16): Grossly normal LV size and function. Small pericardial effusion.  Limited TTE (09/06/16): Mild to moderate pericardial effusion.  Limited TTE (08/18/16): Small pericardial effusion near RV.  Recent CV Pertinent Labs: Lab Results  Component Value Date   INR 2.8 04/11/2018   K 4.2 04/19/2018   MG 2.2 04/19/2018   BUN 21 04/19/2018   CREATININE 0.92 04/19/2018    Past medical and surgical history were reviewed and updated in EPIC.  Current Meds  Medication Sig  . acetaminophen (TYLENOL) 500 MG tablet Take 1,000 mg by mouth every 6 (six) hours as needed.  . calcium  carbonate (OS-CAL) 600 MG TABS tablet  Take 600 mg by mouth daily with breakfast.  . CARTIA XT 120 MG 24 hr capsule TAKE 1 CAPSULE BY MOUTH ONCE DAILY  . diltiazem (CARDIZEM) 30 MG tablet Take 1 tablet as needed for palpitations or heart rate greater than 110  . DULoxetine (CYMBALTA) 20 MG capsule Take 2 capsules (40 mg total) by mouth daily.  . ergocalciferol (VITAMIN D2) 50000 units capsule Take 1 capsule (50,000 Units total) by mouth once a week.  . gabapentin (NEURONTIN) 600 MG tablet   . lamoTRIgine (LAMICTAL) 25 MG tablet TAKE 1 TABLET BY MOUTH TWICE DAILY  . methocarbamol (ROBAXIN) 750 MG tablet Take 1 tablet (750 mg total) by mouth 3 (three) times daily.  . promethazine (PHENERGAN) 25 MG tablet Take 1 tablet (25 mg total) by mouth every 6 (six) hours as needed for nausea or vomiting.  . vitamin B-12 (CYANOCOBALAMIN) 1000 MCG tablet Take 1,000 mcg by mouth daily.  Marland Kitchen warfarin (COUMADIN) 5 MG tablet TAKE 1 TABLET BY MOUTH ONCE DAILY AS DIRECTED   Current Facility-Administered Medications for the 04/19/18 encounter (Office Visit) with Nou Chard, Harrell Gave, MD  Medication  . 0.9 %  sodium chloride infusion    Allergies: Diclofenac; Adhesive [tape]; Atorvastatin; Tramadol; Buprenorphine hcl; Codeine; Fish oil; and Morphine and related  Social History   Tobacco Use  . Smoking status: Never Smoker  . Smokeless tobacco: Never Used  Substance Use Topics  . Alcohol use: No  . Drug use: No    Family History  Problem Relation Age of Onset  . Heart failure Mother   . Diabetes Mother   . Arthritis Mother   . Hyperlipidemia Mother   . Heart disease Mother   . Stroke Mother   . Hypertension Mother   . Cancer - Colon Father   . Colon cancer Father        late 51's  . Arthritis Father   . Cancer Father   . Breast cancer Sister   . Diabetes Maternal Grandfather   . Heart failure Maternal Grandfather   . Stroke Maternal Grandmother   . Lung disease Neg Hx     Review of Systems: A  12-system review of systems was performed and was negative except as noted in the HPI.  --------------------------------------------------------------------------------------------------  Physical Exam: BP 138/62   Pulse 60   Ht 5\' 9"  (1.753 m)   Wt 230 lb (104.3 kg)   SpO2 99%   BMI 33.97 kg/m   General:  NAD.  Accompanied by her husband. HEENT: No conjunctival pallor or scleral icterus. Moist mucous membranes.  OP clear. Neck: Supple without lymphadenopathy, thyromegaly, JVD, or HJR. Lungs: Normal work of breathing. Clear to auscultation bilaterally without wheezes or crackles. Heart: Regular rate and rhythm without murmurs, rubs, or gallops. Non-displaced PMI. Abd: Bowel sounds present. Soft, NT/ND without hepatosplenomegaly Ext: No lower extremity edema. Radial, PT, and DP pulses are 2+ bilaterally. Skin: Warm and dry without rash.  EKG:  NSR without abnormalities.  Lab Results  Component Value Date   WBC 8.4 04/19/2018   HGB 14.3 04/19/2018   HCT 43.6 04/19/2018   MCV 92 04/19/2018   PLT 221 04/19/2018    Lab Results  Component Value Date   NA 143 04/19/2018   K 4.2 04/19/2018   CL 103 04/19/2018   CO2 25 04/19/2018   BUN 21 04/19/2018   CREATININE 0.92 04/19/2018   GLUCOSE 71 04/19/2018   ALT 33 (H) 04/19/2018    No results found for: CHOL, HDL, LDLCALC,  LDLDIRECT, TRIG, CHOLHDL  --------------------------------------------------------------------------------------------------  ASSESSMENT AND PLAN: Palpitations and persistent atrial fibrillation I am unsure if her recurrent palpitations are due to recurrent a-fib.  Given that they have been happening daily over the last 3 weeks, we will have her wear a 48-hour Holter monitor.  If a-fib or other significant arrhythmia is identified, I will refer her to EP for further assessment.  In the meantime, we will continue diltiazem (standing and prn) as well as warfarin.  I will check a CBC, CMP, and TSH  today.  Shortness of breath and atypical chest pain Nonspecific and likely multifactorial.  We discussed further evaluation for CAD, given low risk but mildly abnormal (most like due to artifact) myocardial perfusion stress test last year.  Debbie [name] would like to defer this pending the Holter monitor..  Follow-up: Return to clinic in 1 month.  Nelva Bush, MD 04/20/2018 4:51 PM

## 2018-04-20 ENCOUNTER — Ambulatory Visit
Admission: RE | Admit: 2018-04-20 | Discharge: 2018-04-20 | Disposition: A | Discharge status: Home / Self Care | Payer: Medicare Other | Source: Ambulatory Visit | Attending: Orthopaedic Surgery | Admitting: Orthopaedic Surgery

## 2018-04-20 ENCOUNTER — Encounter: Payer: Self-pay | Admitting: Internal Medicine

## 2018-04-20 DIAGNOSIS — M545 Low back pain: Secondary | ICD-10-CM | POA: Diagnosis not present

## 2018-04-20 DIAGNOSIS — R002 Palpitations: Secondary | ICD-10-CM | POA: Insufficient documentation

## 2018-04-20 LAB — CBC
Hematocrit: 43.6 % (ref 34.0–46.6)
Hemoglobin: 14.3 g/dL (ref 11.1–15.9)
MCH: 30.1 pg (ref 26.6–33.0)
MCHC: 32.8 g/dL (ref 31.5–35.7)
MCV: 92 fL (ref 79–97)
Platelets: 221 10*3/uL (ref 150–450)
RBC: 4.75 x10E6/uL (ref 3.77–5.28)
RDW: 15.4 % (ref 12.3–15.4)
WBC: 8.4 10*3/uL (ref 3.4–10.8)

## 2018-04-20 LAB — COMPREHENSIVE METABOLIC PANEL
ALT: 33 IU/L — ABNORMAL HIGH (ref 0–32)
AST: 35 IU/L (ref 0–40)
Albumin/Globulin Ratio: 1.6 (ref 1.2–2.2)
Albumin: 4.2 g/dL (ref 3.5–4.8)
Alkaline Phosphatase: 63 IU/L (ref 39–117)
BUN/Creatinine Ratio: 23 (ref 12–28)
BUN: 21 mg/dL (ref 8–27)
Bilirubin Total: 0.5 mg/dL (ref 0.0–1.2)
CO2: 25 mmol/L (ref 20–29)
Calcium: 9 mg/dL (ref 8.7–10.3)
Chloride: 103 mmol/L (ref 96–106)
Creatinine, Ser: 0.92 mg/dL (ref 0.57–1.00)
GFR calc Af Amer: 72 mL/min/{1.73_m2} (ref >59)
GFR calc non Af Amer: 63 mL/min/{1.73_m2} (ref >59)
Globulin, Total: 2.6 g/dL (ref 1.5–4.5)
Glucose: 71 mg/dL (ref 65–99)
Potassium: 4.2 mmol/L (ref 3.5–5.2)
Sodium: 143 mmol/L (ref 134–144)
Total Protein: 6.8 g/dL (ref 6.0–8.5)

## 2018-04-20 LAB — TSH: TSH: 1.4 u[IU]/mL (ref 0.450–4.500)

## 2018-04-20 LAB — MAGNESIUM: Magnesium: 2.2 mg/dL (ref 1.6–2.3)

## 2018-04-21 ENCOUNTER — Other Ambulatory Visit: Payer: Self-pay | Admitting: Physical Medicine & Rehabilitation

## 2018-04-27 DIAGNOSIS — M545 Low back pain: Secondary | ICD-10-CM | POA: Diagnosis not present

## 2018-04-28 ENCOUNTER — Other Ambulatory Visit: Payer: Self-pay | Admitting: Neurology

## 2018-04-30 ENCOUNTER — Other Ambulatory Visit: Payer: Self-pay

## 2018-04-30 MED ORDER — LAMOTRIGINE 25 MG PO TABS: 25.0000 mg | ORAL_TABLET | Freq: Two times a day (BID) | ORAL | 1 refills | Status: DC

## 2018-05-03 ENCOUNTER — Ambulatory Visit (INDEPENDENT_AMBULATORY_CARE_PROVIDER_SITE_OTHER): Payer: Medicare Other

## 2018-05-03 DIAGNOSIS — R0602 Shortness of breath: Secondary | ICD-10-CM | POA: Diagnosis not present

## 2018-05-03 DIAGNOSIS — I481 Persistent atrial fibrillation: Secondary | ICD-10-CM

## 2018-05-03 DIAGNOSIS — R002 Palpitations: Secondary | ICD-10-CM | POA: Diagnosis not present

## 2018-05-03 DIAGNOSIS — I4819 Other persistent atrial fibrillation: Secondary | ICD-10-CM

## 2018-05-12 ENCOUNTER — Other Ambulatory Visit: Payer: Self-pay | Admitting: Internal Medicine

## 2018-05-14 NOTE — Telephone Encounter (Signed)
Please review for refill, Thanks !  

## 2018-05-17 ENCOUNTER — Telehealth: Payer: Self-pay | Admitting: Internal Medicine

## 2018-05-17 NOTE — Telephone Encounter (Signed)
Pt is aware of Holter monitor results and recommendations.. Pt will keep the appointment with Dr End on 7/22.

## 2018-05-17 NOTE — Telephone Encounter (Signed)
Pt calling   Pt was told by Altha Harm to call office today about her Holter monitor

## 2018-05-21 ENCOUNTER — Telehealth: Payer: Self-pay | Admitting: Family Medicine

## 2018-05-21 ENCOUNTER — Other Ambulatory Visit: Payer: Self-pay | Admitting: Family Medicine

## 2018-05-21 DIAGNOSIS — M47816 Spondylosis without myelopathy or radiculopathy, lumbar region: Secondary | ICD-10-CM | POA: Diagnosis not present

## 2018-05-21 DIAGNOSIS — E559 Vitamin D deficiency, unspecified: Secondary | ICD-10-CM

## 2018-05-21 MED ORDER — ERGOCALCIFEROL 1.25 MG (50000 UT) PO CAPS
ORAL_CAPSULE | ORAL | 1 refills | Status: DC
Start: 2018-05-21 — End: 2018-09-03

## 2018-05-21 NOTE — Telephone Encounter (Signed)
Message sent to Dr. Jordan for approval. 

## 2018-05-21 NOTE — Telephone Encounter (Signed)
Contact pt if needed    Copied from [place] [id number]. Topic: Quick Communication - Rx Refill/Question >> May 21, 2018  2:53 PM Oliver Pila B wrote: Medication: ergocalciferol (VITAMIN D2) 50000 units capsule [[id number]]   Has the patient contacted their pharmacy? Yes.   (Agent: If no, request that the patient contact the pharmacy for the refill.) (Agent: If yes, when and what did the pharmacy advise?)  Preferred Pharmacy (with phone number or street name): [facility]  Agent: Please be advised that RX refills may take up to 3 business days. We ask that you follow-up with your pharmacy.

## 2018-05-21 NOTE — Telephone Encounter (Signed)
We have not checked vitamin D in over a year. Until next visit continue ergocalciferol 50,000 units every 10 days. Rx sent to the pharmacy.  Thanks, BJ

## 2018-05-23 ENCOUNTER — Ambulatory Visit (INDEPENDENT_AMBULATORY_CARE_PROVIDER_SITE_OTHER): Payer: Medicare Other | Admitting: *Deleted

## 2018-05-23 DIAGNOSIS — Z86711 Personal history of pulmonary embolism: Secondary | ICD-10-CM

## 2018-05-23 DIAGNOSIS — Z5181 Encounter for therapeutic drug level monitoring: Secondary | ICD-10-CM

## 2018-05-23 DIAGNOSIS — Z7901 Long term (current) use of anticoagulants: Secondary | ICD-10-CM | POA: Diagnosis not present

## 2018-05-23 DIAGNOSIS — I2782 Chronic pulmonary embolism: Secondary | ICD-10-CM

## 2018-05-23 LAB — POCT INR: INR: 2.9 (ref 2.0–3.0)

## 2018-05-23 NOTE — Patient Instructions (Signed)
Description   Continue on same dosage 1 tablet daily except 1/2 tablet on Sundays. Recheck INR in 6 weeks. Call Coumadin clinic with any concerns [phone].

## 2018-05-27 NOTE — Progress Notes (Signed)
Follow-up Outpatient Visit Date: 05/28/2018  Primary Care Provider: [name], [name], MD [address] [place] [address]  Chief Complaint: Follow-up palpitations  HPI:  Debbie [name] is a 72 y.o. year-old female with history of persistent atrial fibrillation status post cryoablation, pulmonary embolism (08/2016), hyperlipidemia, chronic kidney disease stage III, obesity, seizure disorder, sleep apnea, and fibromyalgia, who presents for follow-up of atrial fibrillation.  I last saw Debbie [name] in mid June, at which time she had several concerns including palpitations and intermittent chest tightness.  She was concerned that low blood sugars could be precipitating these events, though she does not have a history of diabetes nor does she take any insulin or oral diabetes medications.  She continued to struggle with low back pain and was scheduled for lumbar spine MRI.  Today, Debbie [name] reports that she is "not feeling well."  She has had a lot of problems with her back and hips.  She saw a spine surgeon and was told that her only problem is arthritis.  He offered to give her steroid injections for temporary relief, though she did not wish to pursue this.  She is scheduled to see a pain specialist tomorrow.  She was also recently told that she will need cataract surgery.  Debbie [name] reports that her palpitations are about the same as at our last visit.  They are well controlled when she takes an as needed dose of diltiazem on top of her standing extended release formulation.  She has not had any chest pain.  Exertional dyspnea is stable to may be a little bit worse, which she attributes to the recent heat.  She has stable orthopnea and PND, sleeping at about 30 degrees.  She has been diagnosed with sleep apnea in the past but is intolerant of CPAP due to claustrophobia.  She is unwilling to retry it.  She remains compliant with warfarin and has not experienced any significant  bleeding.  --------------------------------------------------------------------------------------------------  Cardiovascular History & Procedures: Cardiovascular Problems:  Paroxysmal atrial fibrillation  Pulmonary embolism  Risk Factors:  Hyperlipidemia, obesity, and age > 49  Cath/PCI:  LHC(greater than 10 years ago in [place], [place]): Normal per patient's report.  CV Surgery:  None  EP Procedures and Devices:  48-hour Holter monitor (05/03/18): Predominantly sinus rhythm with rare PACs, PVCs, and brief atrial runs lasting up to 4 beats.  No sustained arrhythmia.  30-day event monitor (12/29/16): Predominantly sinus rhythm without significant abnormalities, though quite a bit of artifact was noted.  Atrial fibrillation cryoablation (08/17/16, Dr. Minna Merritts, [facility])  Atrial fibrillation radiofrequency ablation (2014)  Non-Invasive Evaluation(s):  TTE (01/12/17): Normal LV size with mild LVH. LVEF 55-60% with grade 1 diastolic dysfunction. Moderate left atrial enlargement. Trivial pericardial effusion. Normal RV size and function. Normal CVP.  Pharmacologic myocardial perfusion stress test (12/29/16): Low-risk study with fixed mid and apical anterior/anteroseptal/septal defect most likely representing breast attenuation. Small basal inferolateral and basal anterolateral partially reversible defect that most likely represents shifting breast attenuation, though small area of ischemia cannot be excluded. LVEF 55-65%.  Limited TTE (09/13/16): Grossly normal LV size and function. Small pericardial effusion.  Limited TTE (09/06/16): Mild to moderate pericardial effusion.  Limited TTE (08/18/16): Small pericardial effusion near RV.   Recent CV Pertinent Labs: Lab Results  Component Value Date   INR 2.9 05/23/2018   K 4.2 04/19/2018   MG 2.2 04/19/2018   BUN 21 04/19/2018   CREATININE 0.92 04/19/2018    Past medical  and surgical history were reviewed and updated  in EPIC.  Current Meds  Medication Sig  . acetaminophen (TYLENOL) 500 MG tablet Take 1,000 mg by mouth every 6 (six) hours as needed.  . calcium carbonate (OS-CAL) 600 MG TABS tablet Take 600 mg by mouth daily with breakfast.  . CARTIA XT 120 MG 24 hr capsule TAKE 1 CAPSULE BY MOUTH ONCE DAILY  . diltiazem (CARDIZEM) 30 MG tablet Take 1 tablet as needed for palpitations or heart rate greater than 110  . DULoxetine (CYMBALTA) 20 MG capsule Take 2 capsules (40 mg total) by mouth daily.  . ergocalciferol (VITAMIN D2) 50000 units capsule 1 capsule every 10 days.  Marland Kitchen gabapentin (NEURONTIN) 600 MG tablet TAKE 2 TABLETS BY MOUTH THREE TIMES DAILY  . lamoTRIgine (LAMICTAL) 25 MG tablet Take 1 tablet (25 mg total) by mouth 2 (two) times daily.  . methocarbamol (ROBAXIN) 750 MG tablet Take 1 tablet (750 mg total) by mouth 3 (three) times daily.  . promethazine (PHENERGAN) 25 MG tablet Take 1 tablet (25 mg total) by mouth every 6 (six) hours as needed for nausea or vomiting.  . vitamin B-12 (CYANOCOBALAMIN) 1000 MCG tablet Take 1,000 mcg by mouth daily.  Marland Kitchen warfarin (COUMADIN) 5 MG tablet TAKE 1 TABLET BY MOUTH ONCE DAILY AS DIRECTED   Current Facility-Administered Medications for the 05/28/18 encounter (Office Visit) with Quinntin Malter, Harrell Gave, MD  Medication  . 0.9 %  sodium chloride infusion    Allergies: Diclofenac; Adhesive [tape]; Atorvastatin; Tramadol; Buprenorphine hcl; Codeine; Fish oil; and Morphine and related  Social History   Tobacco Use  . Smoking status: Never Smoker  . Smokeless tobacco: Never Used  Substance Use Topics  . Alcohol use: No  . Drug use: No    Family History  Problem Relation Age of Onset  . Heart failure Mother   . Diabetes Mother   . Arthritis Mother   . Hyperlipidemia Mother   . Heart disease Mother   . Stroke Mother   . Hypertension Mother   . Cancer - Colon Father   . Colon cancer Father        late 67's  . Arthritis Father   . Cancer Father   . Breast  cancer Sister   . Diabetes Maternal Grandfather   . Heart failure Maternal Grandfather   . Stroke Maternal Grandmother   . Lung disease Neg Hx     Review of Systems: Review of Systems  Constitutional: Positive for chills, diaphoresis and malaise/fatigue.  HENT: Negative.   Eyes: Positive for blurred vision.  Respiratory: Positive for shortness of breath and wheezing.   Cardiovascular: Positive for palpitations, orthopnea and PND. Negative for chest pain.  Gastrointestinal: Positive for abdominal pain (Right sided).  Musculoskeletal: Positive for back pain, joint pain and myalgias.  Skin: Positive for rash.  Neurological: Positive for dizziness and headaches.  Endo/Heme/Allergies: Bruises/bleeds easily.  Psychiatric/Behavioral: The patient is nervous/anxious.    --------------------------------------------------------------------------------------------------  Physical Exam: BP 100/70   Pulse 76   Ht 5\' 9"  (1.753 m)   Wt 233 lb 9.6 oz (106 kg)   SpO2 97%   BMI 34.50 kg/m   Repeat BP 126/74  General: NAD. HEENT: No conjunctival pallor or scleral icterus. Moist mucous membranes.  OP clear. Neck: Supple without lymphadenopathy, thyromegaly, JVD, or HJR. Lungs: Normal work of breathing. Clear to auscultation bilaterally without wheezes or crackles. Heart: Regular rate and rhythm without murmurs, rubs, or gallops.  Unable to assess PMI due to body habitus.  Abd: Bowel sounds present. Soft with right-sided tenderness.  No rebound or guarding.  Unable to assess HSM due to body habitus. Ext: No lower extremity edema. Radial, PT, and DP pulses are 2+ bilaterally. Skin: Warm and dry without rash.   Lab Results  Component Value Date   WBC 8.4 04/19/2018   HGB 14.3 04/19/2018   HCT 43.6 04/19/2018   MCV 92 04/19/2018   PLT 221 04/19/2018    Lab Results  Component Value Date   NA 143 04/19/2018   K 4.2 04/19/2018   CL 103 04/19/2018   CO2 25 04/19/2018   BUN 21 04/19/2018    CREATININE 0.92 04/19/2018   GLUCOSE 71 04/19/2018   ALT 33 (H) 04/19/2018    No results found for: CHOL, HDL, LDLCALC, LDLDIRECT, TRIG, CHOLHDL  --------------------------------------------------------------------------------------------------  ASSESSMENT AND PLAN: Persistent atrial fibrillation status post ablation Debbie [name] continues to have intermittent palpitations that are relatively well controlled by diltiazem (standing and as needed).  Recent Holter monitor revealed rare PACs and PVCs but no sustained arrhythmias.  We will plan to continue indefinite anticoagulation given CHADSVASC score of at least 3 and history of pulmonary embolism.  We discussed escalation of her extended relief diltiazem, though she wishes to defer this.  Chronic fatigue This is likely multifactorial I encouraged Debbie [name] to reconsider treatment of her sleep apnea, though she does not believe that she would be compliant with CPAP due to her claustrophobia.  I believe a lot of her symptoms are driven by chronic pain, sedentary lifestyle, and anxiety.  I encouraged her to increase her activity and to also speak with her pain specialist about pharmacologic and alternative treatments of her pain and fatigue.  Follow-up: Return to [facility] office in 3 months with Dr. Harrell Gave, given my transition to the [facility] clinic.  Nelva Bush, MD 05/29/2018 9:44 PM

## 2018-05-28 ENCOUNTER — Encounter: Payer: Self-pay | Admitting: Internal Medicine

## 2018-05-28 ENCOUNTER — Ambulatory Visit (INDEPENDENT_AMBULATORY_CARE_PROVIDER_SITE_OTHER): Payer: Medicare Other | Admitting: Internal Medicine

## 2018-05-28 VITALS — BP 100/70 | HR 76 | Ht 69.0 in | Wt 233.6 lb

## 2018-05-28 DIAGNOSIS — R5382 Chronic fatigue, unspecified: Secondary | ICD-10-CM | POA: Diagnosis not present

## 2018-05-28 DIAGNOSIS — I481 Persistent atrial fibrillation: Secondary | ICD-10-CM | POA: Diagnosis not present

## 2018-05-28 DIAGNOSIS — I4819 Other persistent atrial fibrillation: Secondary | ICD-10-CM

## 2018-05-28 NOTE — Patient Instructions (Signed)
Medication Instructions:   Your physician recommends that you continue on your current medications as directed. Please refer to the Current Medication list given to you today.   If you need a refill on your cardiac medications before your next appointment, please call your pharmacy.  Labwork: NONE ORDERED  TODAY    Testing/Procedures: NONE ORDERED  TODAY    Follow-Up: IN 3 MONTHS WITH DR. Harrell Gave AT [place] AS NEW PT     Any Other Special Instructions Will Be Listed Below (If Applicable).

## 2018-05-29 ENCOUNTER — Encounter: Payer: Self-pay | Admitting: Internal Medicine

## 2018-05-29 DIAGNOSIS — M5136 Other intervertebral disc degeneration, lumbar region: Secondary | ICD-10-CM | POA: Diagnosis not present

## 2018-05-29 DIAGNOSIS — G894 Chronic pain syndrome: Secondary | ICD-10-CM | POA: Diagnosis not present

## 2018-05-29 DIAGNOSIS — Z79899 Other long term (current) drug therapy: Secondary | ICD-10-CM | POA: Diagnosis not present

## 2018-05-31 ENCOUNTER — Ambulatory Visit: Payer: Medicare Other | Admitting: Neurology

## 2018-06-04 NOTE — Addendum Note (Signed)
Addended by: Doneta Public C on: 06/04/2018 03:19 PM   Modules accepted: Orders
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
## 2018-06-13 ENCOUNTER — Ambulatory Visit (INDEPENDENT_AMBULATORY_CARE_PROVIDER_SITE_OTHER): Payer: Medicare Other

## 2018-06-13 VITALS — BP 124/60 | HR 60 | Ht 70.0 in | Wt 235.0 lb

## 2018-06-13 DIAGNOSIS — Z0000 Encounter for general adult medical examination without abnormal findings: Secondary | ICD-10-CM

## 2018-06-13 NOTE — Patient Instructions (Addendum)
Debbie [name] , Thank you for taking time to come for your Medicare Wellness Visit. I appreciate your ongoing commitment to your health goals. Please review the following plan we discussed and let me know if I can assist you in the future.   Will make an apt with Dr. [name] prior to leaving today to review the irritation under breast as well  Right heal; stopped on object  To fup on blood noted from rectum / plans to make an apt with GI  To schedule cataract surgery   Still to schedule hearing test  [facility] - can assist with hearing aid x 1  No reviews  [facility]  [place] [address]  [phone]  [email or web address]  A Tetanus is recommended every 10 years. Medicare covers a tetanus if you have a cut or wound; otherwise, there may be a charge. If you had not had a tetanus with pertusses, known as the Tdap, you can take this anytime.   In Nov you will need your last pneumonia vaccine or the prevnar 13    Will call GI regarding recent rectal bleeding Debbie Dolly, MD  Also gyn referral; Referred to Dr. Sumner Boast at [name]  [address] (the [facility])  [place], [place] [address] Directions  [phone]  You can have a bone density at 20 which is a preventive screen to check for bone thinning. We will wait to schedule this after you see Dr. Talbert Nan.  Will check to see when you had a colonoscopy so we can confirm when it is due? 10 years or other   These are the goals we discussed: Goals    . Exercise 150 min/wk Moderate Activity     Find a warm water aerobics pool.        This is a list of the screening recommended for you and due dates:  Health Maintenance  Topic Date Due  . Flu Shot  06/07/2018  . DEXA scan (bone density measurement)  06/14/2019*  . Tetanus Vaccine  06/14/2019*  .  Pneumonia vaccines (2 of 2 - PCV13) 09/08/2018  . Colon Cancer Screening  11/07/2021  *Topic was postponed. The date shown is not the original due date.     Bone Densitometry Bone densitometry is an imaging test that uses a special X-ray to measure the amount of calcium and other minerals in your bones (bone density). This test is also known as a bone mineral density test or dual-energy X-ray absorptiometry (DXA). The test can measure bone density at your hip and your spine. It is similar to having a regular X-ray. You may have this test to:  Diagnose a condition that causes weak or thin bones (osteoporosis).  Predict your risk of a broken bone (fracture).  Determine how well osteoporosis treatment is working.  Tell a health care provider about:  Any allergies you have.  All medicines you are taking, including vitamins, herbs, eye drops, creams, and over-the-counter medicines.  Any problems you or family members have had with anesthetic medicines.  Any blood disorders you have.  Any surgeries you have had.  Any medical conditions you have.  Possibility of pregnancy.  Any other medical test you had within the previous 14 days that used contrast material. What are the risks? Generally, this is a safe procedure. However, problems can occur and may include the following:  This test exposes you to a very small amount of radiation.  The risks of radiation exposure may be greater to unborn children.  What happens before the procedure?  Do not take any calcium supplements for 24 hours before having the test. You can otherwise eat and drink what you usually do.  Take off all metal jewelry, eyeglasses, dental appliances, and any other metal objects. What happens during the procedure?  You may lie on an exam table. There will be an X-ray generator below you and an imaging device above you.  Other devices, such as boxes or braces, may be used to position your body properly for  the scan.  You will need to lie still while the machine slowly scans your body.  The images will show up on a computer monitor. What happens after the procedure? You may need more testing at a later time. This information is not intended to replace advice given to you by your health care provider. Make sure you discuss any questions you have with your health care provider. Document Released: 11/15/2004 Document Revised: 03/31/2016 Document Reviewed: 04/03/2014 Elsevier Interactive Patient Education  2018 [place] in the Home Falls can cause injuries. They can happen to people of all ages. There are many things you can do to make your home safe and to help prevent falls. What can I do on the outside of my home?  Regularly fix the edges of walkways and driveways and fix any cracks.  Remove anything that might make you trip as you walk through a door, such as a raised step or threshold.  Trim any bushes or trees on the path to your home.  Use bright outdoor lighting.  Clear any walking paths of anything that might make someone trip, such as rocks or tools.  Regularly check to see if handrails are loose or broken. Make sure that both sides of any steps have handrails.  Any raised decks and porches should have guardrails on the edges.  Have any leaves, snow, or ice cleared regularly.  Use sand or salt on walking paths during winter.  Clean up any spills in your garage right away. This includes oil or grease spills. What can I do in the bathroom?  Use night lights.  Install grab bars by the toilet and in the tub and shower. Do not use towel bars as grab bars.  Use non-skid mats or decals in the tub or shower.  If you need to sit down in the shower, use a plastic, non-slip stool.  Keep the floor dry. Clean up any water that spills on the floor as soon as it happens.  Remove soap buildup in the tub or shower regularly.  Attach bath mats securely with  double-sided non-slip rug tape.  Do not have throw rugs and other things on the floor that can make you trip. What can I do in the bedroom?  Use night lights.  Make sure that you have a light by your bed that is easy to reach.  Do not use any sheets or blankets that are too big for your bed. They should not hang down onto the floor.  Have a firm chair that has side arms. You can use this for support while you get dressed.  Do not have throw rugs and other things on the floor that can make you trip. What can I do in the kitchen?  Clean up any spills right away.  Avoid walking on wet floors.  Keep items that you use a lot in easy-to-reach places.  If you need to reach something above you, use a strong step stool that has a grab bar.  Keep electrical cords out of the way.  Do not use floor polish or wax that makes floors slippery. If you must use wax, use non-skid floor wax.  Do not have throw rugs and other things on the floor that can make you trip. What can I do with my stairs?  Do not leave any items on the stairs.  Make sure that there are handrails on both sides of the stairs and use them. Fix handrails that are broken or loose. Make sure that handrails are as long as the stairways.  Check any carpeting to make sure that it is firmly attached to the stairs. Fix any carpet that is loose or worn.  Avoid having throw rugs at the top or bottom of the stairs. If you do have throw rugs, attach them to the floor with carpet tape.  Make sure that you have a light switch at the top of the stairs and the bottom of the stairs. If you do not have them, ask someone to add them for you. What else can I do to help prevent falls?  Wear shoes that: ? Do not have high heels. ? Have rubber bottoms. ? Are comfortable and fit you well. ? Are closed at the toe. Do not wear sandals.  If you use a stepladder: ? Make sure that it is fully opened. Do not climb a closed stepladder. ? Make  sure that both sides of the stepladder are locked into place. ? Ask someone to hold it for you, if possible.  Clearly mark and make sure that you can see: ? Any grab bars or handrails. ? First and last steps. ? Where the edge of each step is.  Use tools that help you move around (mobility aids) if they are needed. These include: ? Canes. ? Walkers. ? Scooters. ? Crutches.  Turn on the lights when you go into a dark area. Replace any light bulbs as soon as they burn out.  Set up your furniture so you have a clear path. Avoid moving your furniture around.  If any of your floors are uneven, fix them.  If there are any pets around you, be aware of where they are.  Review your medicines with your doctor. Some medicines can make you feel dizzy. This can increase your chance of falling. Ask your doctor what other things that you can do to help prevent falls. This information is not intended to replace advice given to you by your health care provider. Make sure you discuss any questions you have with your health care provider. Document Released: 08/20/2009 Document Revised: 03/31/2016 Document Reviewed: 11/28/2014 Elsevier Interactive Patient Education  2018 [place], Female Adopting a healthy lifestyle and getting preventive care can go a long way to promote health and wellness. Talk with your health care provider about what schedule of regular examinations is right for you. This is a good chance for you to check in with your provider about disease prevention and staying healthy. In between checkups, there are plenty of things you can do on your own. Experts have done a lot of research about which lifestyle changes and preventive measures are most likely to keep you healthy. Ask your health care provider for more information. Weight and diet Eat a healthy diet  Be sure to include plenty of vegetables, fruits, low-fat dairy products, and lean protein.  Do not eat  a lot of foods high in solid fats, added sugars, or salt.  Get regular exercise. This is one of the most important things you can do for your health. ? Most adults should exercise for at least 150 minutes each week. The exercise should increase your heart rate and make you sweat (moderate-intensity exercise). ? Most adults should also do strengthening exercises at least twice a week. This is in addition to the moderate-intensity exercise.  Maintain a healthy weight  Body mass index (BMI) is a measurement that can be used to identify possible weight problems. It estimates body fat based on height and weight. Your health care provider can help determine your BMI and help you achieve or maintain a healthy weight.  For females 33 years of age and older: ? A BMI below 18.5 is considered underweight. ? A BMI of 18.5 to 24.9 is normal. ? A BMI of 25 to 29.9 is considered overweight. ? A BMI of 30 and above is considered obese.  Watch levels of cholesterol and blood lipids  You should start having your blood tested for lipids and cholesterol at 72 years of age, then have this test every 5 years.  You may need to have your cholesterol levels checked more often if: ? Your lipid or cholesterol levels are high. ? You are older than 72 years of age. ? You are at high risk for heart disease.  Cancer screening Lung Cancer  Lung cancer screening is recommended for adults 65-75 years old who are at high risk for lung cancer because of a history of smoking.  A yearly low-dose CT scan of the lungs is recommended for people who: ? Currently smoke. ? Have quit within the past 15 years. ? Have at least a 30-pack-year history of smoking. A pack year is smoking an average of one pack of cigarettes a day for 1 year.  Yearly screening should continue until it has been 15 years since you quit.  Yearly screening should stop if you develop a health problem that would prevent you from having lung cancer  treatment.  Breast Cancer  Practice breast self-awareness. This means understanding how your breasts normally appear and feel.  It also means doing regular breast self-exams. Let your health care provider know about any changes, no matter how small.  If you are in your 20s or 30s, you should have a clinical breast exam (CBE) by a health care provider every 1-3 years as part of a regular health exam.  If you are 62 or older, have a CBE every year. Also consider having a breast X-ray (mammogram) every year.  If you have a family history of breast cancer, talk to your health care provider about genetic screening.  If you are at high risk for breast cancer, talk to your health care provider about having an MRI and a mammogram every year.  Breast cancer gene (BRCA) assessment is recommended for women who have family members with BRCA-related cancers. BRCA-related cancers include: ? Breast. ? Ovarian. ? Tubal. ? Peritoneal cancers.  Results of the assessment will determine the need for genetic counseling and BRCA1 and BRCA2 testing.  Cervical Cancer Your health care provider may recommend that you be screened regularly for cancer of the pelvic organs (ovaries, uterus, and vagina). This screening involves a pelvic examination, including checking for microscopic changes to the surface of your cervix (Pap test). You may be encouraged to have this screening done every 3 years, beginning at  age 68.  For women ages 101-65, health care providers may recommend pelvic exams and Pap testing every 3 years, or they may recommend the Pap and pelvic exam, combined with testing for human papilloma virus (HPV), every 5 years. Some types of HPV increase your risk of cervical cancer. Testing for HPV may also be done on women of any age with unclear Pap test results.  Other health care providers may not recommend any screening for nonpregnant women who are considered low risk for pelvic cancer and who do not have  symptoms. Ask your health care provider if a screening pelvic exam is right for you.  If you have had past treatment for cervical cancer or a condition that could lead to cancer, you need Pap tests and screening for cancer for at least 20 years after your treatment. If Pap tests have been discontinued, your risk factors (such as having a new sexual partner) need to be reassessed to determine if screening should resume. Some women have medical problems that increase the chance of getting cervical cancer. In these cases, your health care provider may recommend more frequent screening and Pap tests.  Colorectal Cancer  This type of cancer can be detected and often prevented.  Routine colorectal cancer screening usually begins at 72 years of age and continues through 72 years of age.  Your health care provider may recommend screening at an earlier age if you have risk factors for colon cancer.  Your health care provider may also recommend using home test kits to check for hidden blood in the stool.  A small camera at the end of a tube can be used to examine your colon directly (sigmoidoscopy or colonoscopy). This is done to check for the earliest forms of colorectal cancer.  Routine screening usually begins at age 49.  Direct examination of the colon should be repeated every 5-10 years through 72 years of age. However, you may need to be screened more often if early forms of precancerous polyps or small growths are found.  Skin Cancer  Check your skin from head to toe regularly.  Tell your health care provider about any new moles or changes in moles, especially if there is a change in a mole's shape or color.  Also tell your health care provider if you have a mole that is larger than the size of a pencil eraser.  Always use sunscreen. Apply sunscreen liberally and repeatedly throughout the day.  Protect yourself by wearing long sleeves, pants, a wide-brimmed hat, and sunglasses whenever you  are outside.  Heart disease, diabetes, and high blood pressure  High blood pressure causes heart disease and increases the risk of stroke. High blood pressure is more likely to develop in: ? People who have blood pressure in the high end of the normal range (130-139/85-89 mm Hg). ? People who are overweight or obese. ? People who are African American.  If you are 35-94 years of age, have your blood pressure checked every 3-5 years. If you are 18 years of age or older, have your blood pressure checked every year. You should have your blood pressure measured twice-once when you are at a hospital or clinic, and once when you are not at a hospital or clinic. Record the average of the two measurements. To check your blood pressure when you are not at a hospital or clinic, you can use: ? An automated blood pressure machine at a pharmacy. ? A home blood pressure monitor.  If you are  between 37 years and 44 years old, ask your health care provider if you should take aspirin to prevent strokes.  Have regular diabetes screenings. This involves taking a blood sample to check your fasting blood sugar level. ? If you are at a normal weight and have a low risk for diabetes, have this test once every three years after 72 years of age. ? If you are overweight and have a high risk for diabetes, consider being tested at a younger age or more often. Preventing infection Hepatitis B  If you have a higher risk for hepatitis B, you should be screened for this virus. You are considered at high risk for hepatitis B if: ? You were born in a country where hepatitis B is common. Ask your health care provider which countries are considered high risk. ? Your parents were born in a high-risk country, and you have not been immunized against hepatitis B (hepatitis B vaccine). ? You have HIV or AIDS. ? You use needles to inject street drugs. ? You live with someone who has hepatitis B. ? You have had sex with someone who  has hepatitis B. ? You get hemodialysis treatment. ? You take certain medicines for conditions, including cancer, organ transplantation, and autoimmune conditions.  Hepatitis C  Blood testing is recommended for: ? Everyone born from [birth year] through 1965. ? Anyone with known risk factors for hepatitis C.  Sexually transmitted infections (STIs)  You should be screened for sexually transmitted infections (STIs) including gonorrhea and chlamydia if: ? You are sexually active and are younger than 72 years of age. ? You are older than 72 years of age and your health care provider tells you that you are at risk for this type of infection. ? Your sexual activity has changed since you were last screened and you are at an increased risk for chlamydia or gonorrhea. Ask your health care provider if you are at risk.  If you do not have HIV, but are at risk, it may be recommended that you take a prescription medicine daily to prevent HIV infection. This is called pre-exposure prophylaxis (PrEP). You are considered at risk if: ? You are sexually active and do not regularly use condoms or know the HIV status of your partner(s). ? You take drugs by injection. ? You are sexually active with a partner who has HIV.  Talk with your health care provider about whether you are at high risk of being infected with HIV. If you choose to begin PrEP, you should first be tested for HIV. You should then be tested every 3 months for as long as you are taking PrEP. Pregnancy  If you are premenopausal and you may become pregnant, ask your health care provider about preconception counseling.  If you may become pregnant, take 400 to 800 micrograms (mcg) of folic acid every day.  If you want to prevent pregnancy, talk to your health care provider about birth control (contraception). Osteoporosis and menopause  Osteoporosis is a disease in which the bones lose minerals and strength with aging. This can result in serious bone  fractures. Your risk for osteoporosis can be identified using a bone density scan.  If you are 62 years of age or older, or if you are at risk for osteoporosis and fractures, ask your health care provider if you should be screened.  Ask your health care provider whether you should take a calcium or vitamin D supplement to lower your risk for osteoporosis.  Menopause may  have certain physical symptoms and risks.  Hormone replacement therapy may reduce some of these symptoms and risks. Talk to your health care provider about whether hormone replacement therapy is right for you. Follow these instructions at home:  Schedule regular health, dental, and eye exams.  Stay current with your immunizations.  Do not use any tobacco products including cigarettes, chewing tobacco, or electronic cigarettes.  If you are pregnant, do not drink alcohol.  If you are breastfeeding, limit how much and how often you drink alcohol.  Limit alcohol intake to no more than 1 drink per day for nonpregnant women. One drink equals 12 ounces of beer, 5 ounces of wine, or 1 ounces of hard liquor.  Do not use street drugs.  Do not share needles.  Ask your health care provider for help if you need support or information about quitting drugs.  Tell your health care provider if you often feel depressed.  Tell your health care provider if you have ever been abused or do not feel safe at home. This information is not intended to replace advice given to you by your health care provider. Make sure you discuss any questions you have with your health care provider. Document Released: 05/09/2011 Document Revised: 03/31/2016 Document Reviewed: 07/28/2015 Elsevier Interactive Patient Education  2018 [employer].   Hearing Loss Hearing loss is a partial or total loss of the ability to hear. This can be temporary or permanent, and it can happen in one or both ears. Hearing loss may be referred to as deafness. Medical  care is necessary to treat hearing loss properly and to prevent the condition from getting worse. Your hearing may partially or completely come back, depending on what caused your hearing loss and how severe it is. In some cases, hearing loss is permanent. What are the causes? Common causes of hearing loss include:  Too much wax in the ear canal.  Infection of the ear canal or middle ear.  Fluid in the middle ear.  Injury to the ear or surrounding area.  An object stuck in the ear.  Prolonged exposure to loud sounds, such as music.  Less common causes of hearing loss include:  Tumors in the ear.  Viral or bacterial infections, such as meningitis.  A hole in the eardrum (perforated eardrum).  Problems with the hearing nerve that sends signals between the brain and the ear.  Certain medicines.  What are the signs or symptoms? Symptoms of this condition may include:  Difficulty telling the difference between sounds.  Difficulty following a conversation when there is background noise.  Lack of response to sounds in your environment. This may be most noticeable when you do not respond to startling sounds.  Needing to turn up the volume on the television, radio, etc.  Ringing in the ears.  Dizziness.  Pain in the ears.  How is this diagnosed? This condition is diagnosed based on a physical exam and a hearing test (audiometry). The audiometry test will be performed by a hearing specialist (audiologist). You may also be referred to an ear, nose, and throat (ENT) specialist (otolaryngologist). How is this treated? Treatment for recent onset of hearing loss may include:  Ear wax removal.  Being prescribed medicines to prevent infection (antibiotics).  Being prescribed medicines to reduce inflammation (corticosteroids).  Follow these instructions at home:  If you were prescribed an antibiotic medicine, take it as told by your health care provider. Do not stop taking the  antibiotic even if you  start to feel better.  Take over-the-counter and prescription medicines only as told by your health care provider.  Avoid loud noises.  Return to your normal activities as told by your health care provider. Ask your health care provider what activities are safe for you.  Keep all follow-up visits as told by your health care provider. This is important. Contact a health care provider if:  You feel dizzy.  You develop new symptoms.  You vomit or feel nauseous.  You have a fever. Get help right away if:  You develop sudden changes in your vision.  You have severe ear pain.  You have new or increased weakness.  You have a severe headache. This information is not intended to replace advice given to you by your health care provider. Make sure you discuss any questions you have with your health care provider. Document Released: 10/24/2005 Document Revised: 03/31/2016 Document Reviewed: 03/11/2015 Elsevier Interactive Patient Education  2018 [employer].

## 2018-06-14 DIAGNOSIS — G894 Chronic pain syndrome: Secondary | ICD-10-CM | POA: Diagnosis not present

## 2018-06-14 DIAGNOSIS — M5136 Other intervertebral disc degeneration, lumbar region: Secondary | ICD-10-CM | POA: Diagnosis not present

## 2018-06-14 DIAGNOSIS — Z79899 Other long term (current) drug therapy: Secondary | ICD-10-CM | POA: Diagnosis not present

## 2018-06-15 ENCOUNTER — Ambulatory Visit (INDEPENDENT_AMBULATORY_CARE_PROVIDER_SITE_OTHER): Payer: Medicare Other | Admitting: Family Medicine

## 2018-06-15 ENCOUNTER — Encounter: Payer: Self-pay | Admitting: Family Medicine

## 2018-06-15 ENCOUNTER — Ambulatory Visit (INDEPENDENT_AMBULATORY_CARE_PROVIDER_SITE_OTHER): Payer: Medicare Other

## 2018-06-15 VITALS — BP 104/70 | HR 64 | Temp 98.1°F | Resp 16 | Ht 70.0 in | Wt 235.0 lb

## 2018-06-15 DIAGNOSIS — K625 Hemorrhage of anus and rectum: Secondary | ICD-10-CM

## 2018-06-15 DIAGNOSIS — K5900 Constipation, unspecified: Secondary | ICD-10-CM

## 2018-06-15 DIAGNOSIS — R05 Cough: Secondary | ICD-10-CM

## 2018-06-15 DIAGNOSIS — R079 Chest pain, unspecified: Secondary | ICD-10-CM | POA: Diagnosis not present

## 2018-06-15 DIAGNOSIS — M797 Fibromyalgia: Secondary | ICD-10-CM

## 2018-06-15 DIAGNOSIS — L298 Other pruritus: Secondary | ICD-10-CM | POA: Diagnosis not present

## 2018-06-15 DIAGNOSIS — R059 Cough, unspecified: Secondary | ICD-10-CM

## 2018-06-15 NOTE — Assessment & Plan Note (Signed)
This is a chronic problem and reported as stable overall. Continue MiraLAX daily as needed, she can add bisacodyl 5 mg daily as needed. Adequate fiber and water intake.

## 2018-06-15 NOTE — Assessment & Plan Note (Signed)
We review clinical presentation of fibromyalgia. I think chest pain and right-sided pain are not related to this problem. Continue Cymbalta 30 mg daily. Continue following with Dr. Posey Pronto.

## 2018-06-15 NOTE — Patient Instructions (Addendum)
A few things to remember from today's visit:   Cough - Plan: DG Chest 2 View  Rectal bleeding - Plan: Ambulatory referral to Gastroenterology  Fibromyalgia  I think chest pain right side pain related to fibromyalgia. Appointment with gastroenterology will be arranged.  Please be sure medication list is accurate. If a new problem present, please set up appointment sooner than planned today.
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
## 2018-06-18 ENCOUNTER — Ambulatory Visit (INDEPENDENT_AMBULATORY_CARE_PROVIDER_SITE_OTHER): Payer: Medicare Other | Admitting: Neurology

## 2018-06-18 ENCOUNTER — Encounter: Payer: Self-pay | Admitting: Neurology

## 2018-06-18 VITALS — BP 127/74 | HR 73 | Ht 70.0 in | Wt 236.0 lb

## 2018-06-18 DIAGNOSIS — M797 Fibromyalgia: Secondary | ICD-10-CM | POA: Diagnosis not present

## 2018-06-18 DIAGNOSIS — M4316 Spondylolisthesis, lumbar region: Secondary | ICD-10-CM

## 2018-06-18 DIAGNOSIS — G43009 Migraine without aura, not intractable, without status migrainosus: Secondary | ICD-10-CM | POA: Diagnosis not present

## 2018-06-18 MED ORDER — PROMETHAZINE HCL 25 MG PO TABS: 25.0000 mg | ORAL_TABLET | Freq: Four times a day (QID) | ORAL | 1 refills | Status: DC | PRN

## 2018-06-18 MED ORDER — GABAPENTIN 600 MG PO TABS: 600.0000 mg | ORAL_TABLET | Freq: Three times a day (TID) | ORAL | 0 refills | Status: DC

## 2018-06-18 NOTE — Progress Notes (Signed)
Reason for visit: Headache, fibromyalgia  Debbie [name] is an 72 y.o. female  History of present illness:  Debbie [name] is a 72 year old right-handed white female with a history of atrial fibrillation on Coumadin, she has a benign essential tremor that is gradually worsening over time.  She has a history of migraine headaches that have been well controlled on very low-dose Lamictal, she takes 25 mg twice daily.  The patient gets annual blood work done through her primary care physician.  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  She is no longer followed by Dr. Posey Pronto, she has been seen by Dr. Donovan Kail from [facility], she is now back on hydrocodone.  The patient is having difficulty sleeping at night because of urinary frequency, she gets up at least 4 times at night to urinate, she does have urinary urgency.  The patient may nap during the day.  She has not seen urology for this issue.  The patient does not exercise on a regular basis.  She does have an essential tremor that is gradually worsening over time and affecting her handwriting and her ability to feed herself.  She denies any family history of tremor.  She is being seen by Dr. Mina Marble from [facility], he recommended epidural steroid injections but she does not wish to do this.  Past Medical History:  Diagnosis Date  . Atrial fibrillation ([place])   . Benign essential tremor   . Breast cancer ([place])   . Chronic renal insufficiency   . Common migraine 05/07/2015  . Depression with anxiety   . Drowsiness    Excessive daytime  . Dyslipidemia   . Fibromyalgia   . History of partial seizures   . Hypertension   . Memory disturbance   . Muscle tension headache   . Obesity   . Obstructive sleep apnea   . S/P epidural steroid injection   . Seizures ([place])    Partial  . Sprain of neck 06/26/2013  . Tremors of nervous system   . Vitamin B12 deficiency     Past Surgical History:    Procedure Laterality Date  . ABDOMINAL HYSTERECTOMY    . ablation procedure     Cardiac, for Afib  . BASAL CELL CARCINOMA EXCISION    . BASAL CELL CARCINOMA EXCISION    . BRAIN MENINGIOMA EXCISION Right   . BREAST RECONSTRUCTION Bilateral   . breast reconstructive    . CHOLECYSTECTOMY    . DILATION AND CURETTAGE OF UTERUS    . dilution    . ESOPHAGEAL MANOMETRY N/A 12/14/2016   Procedure: ESOPHAGEAL MANOMETRY (EM);  Surgeon: Mauri Pole, MD;  Location: [facility];  Service: Endoscopy;  Laterality: N/A;  . gallbladder resection    . LUMBAR SPINE SURGERY N/A   . lumbosacral    . mastectomies Bilateral   . NOSE SURGERY     Basal cell carcinoma resection from the nose  . TUBAL LIGATION N/A     Family History  Problem Relation Age of Onset  . Heart failure Mother   . Diabetes Mother   . Arthritis Mother   . Hyperlipidemia Mother   . Heart disease Mother   . Stroke Mother   . Hypertension Mother   . Cancer - Colon Father   . Colon cancer Father        late 60's  . Arthritis Father   . Cancer Father   . Breast cancer Sister   .  Diabetes Maternal Grandfather   . Heart failure Maternal Grandfather   . Stroke Maternal Grandmother   . Lung disease Neg Hx     Social history:  reports that she has never smoked. She has never used smokeless tobacco. She reports that she does not drink alcohol or use drugs.    Allergies  Allergen Reactions  . Diclofenac Shortness Of Breath  . Atorvastatin Other (See Comments)    Other reaction(s): Myalgias (intolerance)  . Tape Other (See Comments)    Peels skin off (bandaids, too) unknown  . Tramadol Other (See Comments) and Nausea And Vomiting  . Buprenorphine Hcl Nausea And Vomiting  . Codeine Nausea And Vomiting  . Fish Oil Nausea And Vomiting  . Morphine And Related Nausea And Vomiting    Medications:  Prior to Admission medications   Medication Sig Start Date End Date Taking? Authorizing Provider  acetaminophen  (TYLENOL) 500 MG tablet Take 1,000 mg by mouth every 6 (six) hours as needed.   Yes [provider]  calcium carbonate (OS-CAL) 600 MG TABS tablet Take 600 mg by mouth daily with breakfast.   Yes [provider]  CARTIA XT 120 MG 24 hr capsule TAKE 1 CAPSULE BY MOUTH ONCE DAILY 10/16/17  Yes [name], [name], MD  diltiazem (CARDIZEM) 30 MG tablet Take 1 tablet as needed for palpitations or heart rate greater than 110 02/10/17  Yes End, Harrell Gave, MD  DULoxetine (CYMBALTA) 20 MG capsule Take 2 capsules (40 mg total) by mouth daily. 03/12/18  Yes [name], [name], MD  ergocalciferol (VITAMIN D2) 50000 units capsule 1 capsule every 10 days. 05/21/18  Yes [name], [name], MD  gabapentin (NEURONTIN) 600 MG tablet TAKE 2 TABLETS BY MOUTH THREE TIMES DAILY 04/23/18  Yes Jamse Arn, MD  HYDROcodone-acetaminophen (NORCO/VICODIN) 5-325 MG tablet Take 1 tablet by mouth 2 (two) times daily. 06/14/18  Yes [provider]  lamoTRIgine (LAMICTAL) 25 MG tablet Take 1 tablet (25 mg total) by mouth 2 (two) times daily. 04/30/18  Yes Kathrynn Ducking, MD  methocarbamol (ROBAXIN) 750 MG tablet Take 1 tablet (750 mg total) by mouth 3 (three) times daily. 02/12/18  Yes Jamse Arn, MD  promethazine (PHENERGAN) 25 MG tablet Take 1 tablet (25 mg total) by mouth every 6 (six) hours as needed for nausea or vomiting. 06/18/18  Yes Kathrynn Ducking, MD  vitamin B-12 (CYANOCOBALAMIN) 1000 MCG tablet Take 1,000 mcg by mouth daily.   Yes [provider]  warfarin (COUMADIN) 5 MG tablet TAKE 1 TABLET BY MOUTH ONCE DAILY AS DIRECTED 05/14/18  Yes End, Harrell Gave, MD  rivaroxaban (XARELTO) 20 MG TABS tablet Take 1 tablet (20 mg total) by mouth daily with supper. 12/22/16 10/19/17  End, Harrell Gave, MD    ROS:  Out of a complete 14 system review of symptoms, the patient complains only of the following symptoms, and all other reviewed systems are negative.  Tremor Low back pain Urinary  frequency  Blood pressure 127/74, pulse 73, height 5\' 10"  (1.778 m), weight 236 lb (107 kg).  Physical Exam  General: The patient is alert and cooperative at the time of the examination.  The patient is moderately to markedly obese.  Skin: No significant peripheral edema is noted.   Neurologic Exam  Mental status: The patient is alert and oriented x 3 at the time of the examination. The patient has apparent normal recent and remote memory, with an apparently normal attention span and concentration ability.  Cranial nerves: Facial symmetry is present. Speech is normal, no aphasia or dysarthria is noted. Extraocular movements are full. Visual fields are full.  Motor: The patient has good strength in all 4 extremities.  Sensory examination: Soft touch sensation is symmetric on the face, arms, and legs.  Coordination: The patient has good finger-nose-finger and heel-to-shin bilaterally.  Gait and station: The patient has a normal gait. Tandem gait is slightly unsteady. Romberg is negative. No drift is seen.  Reflexes: Deep tendon reflexes are symmetric.   Assessment/Plan:  1.  Fibromyalgia  2.  Migraine headache  3.  Essential tremor  The patient may require a urology evaluation to help improve the urinary frequency at night.  The patient is back on hydrocodone, her back pain is being better controlled.  She will be given a prescription for her Phenergan, she has reduced her gabapentin dose, she is on 600 mg 3 times daily.  The patient will continue the Lamictal 25 mg twice daily.  She will follow-up in 1 year, sooner if needed.  I have recommended that she get into a regular exercise program.  Jill Alexanders MD 06/18/2018 7:55 AM  [facility] [address] [place], [place] [address]  Phone [phone] Fax [phone]

## 2018-06-19 NOTE — Progress Notes (Signed)
I have reviewed documentation from this visit and I agree with recommendations given.  Ashyla Luth G. Filippa Yarbough, MD  [facility]. [place] office.   

## 2018-07-04 ENCOUNTER — Ambulatory Visit (INDEPENDENT_AMBULATORY_CARE_PROVIDER_SITE_OTHER): Payer: Medicare Other | Admitting: *Deleted

## 2018-07-04 DIAGNOSIS — I2782 Chronic pulmonary embolism: Secondary | ICD-10-CM | POA: Diagnosis not present

## 2018-07-04 DIAGNOSIS — Z7901 Long term (current) use of anticoagulants: Secondary | ICD-10-CM

## 2018-07-04 DIAGNOSIS — Z86711 Personal history of pulmonary embolism: Secondary | ICD-10-CM | POA: Diagnosis not present

## 2018-07-04 DIAGNOSIS — Z5181 Encounter for therapeutic drug level monitoring: Secondary | ICD-10-CM

## 2018-07-04 LAB — POCT INR: INR: 2.4 (ref 2.0–3.0)

## 2018-07-04 NOTE — Patient Instructions (Signed)
Description   Continue on same dosage 1 tablet daily except 1/2 tablet on Sundays. Recheck INR in 6 weeks. Call Coumadin clinic with any concerns [phone].

## 2018-07-07 ENCOUNTER — Other Ambulatory Visit: Payer: Self-pay | Admitting: Family Medicine

## 2018-07-07 DIAGNOSIS — I4891 Unspecified atrial fibrillation: Secondary | ICD-10-CM

## 2018-07-08 DIAGNOSIS — S300XXA Contusion of lower back and pelvis, initial encounter: Secondary | ICD-10-CM

## 2018-07-08 HISTORY — DX: Contusion of lower back and pelvis, initial encounter: S30.0XXA

## 2018-07-11 ENCOUNTER — Emergency Department (HOSPITAL_COMMUNITY): Payer: Medicare Other

## 2018-07-11 ENCOUNTER — Observation Stay (HOSPITAL_COMMUNITY)
Admission: EM | Admit: 2018-07-11 | Discharge: 2018-07-12 | Disposition: A | Discharge status: Home / Self Care | Payer: Medicare Other | Attending: Internal Medicine | Admitting: Internal Medicine

## 2018-07-11 ENCOUNTER — Other Ambulatory Visit: Payer: Self-pay

## 2018-07-11 ENCOUNTER — Encounter (HOSPITAL_COMMUNITY): Payer: Self-pay | Admitting: *Deleted

## 2018-07-11 DIAGNOSIS — R52 Pain, unspecified: Secondary | ICD-10-CM | POA: Diagnosis not present

## 2018-07-11 DIAGNOSIS — S199XXA Unspecified injury of neck, initial encounter: Secondary | ICD-10-CM | POA: Diagnosis not present

## 2018-07-11 DIAGNOSIS — Z86711 Personal history of pulmonary embolism: Secondary | ICD-10-CM | POA: Insufficient documentation

## 2018-07-11 DIAGNOSIS — Z853 Personal history of malignant neoplasm of breast: Secondary | ICD-10-CM | POA: Diagnosis not present

## 2018-07-11 DIAGNOSIS — G4733 Obstructive sleep apnea (adult) (pediatric): Secondary | ICD-10-CM | POA: Diagnosis not present

## 2018-07-11 DIAGNOSIS — R51 Headache: Secondary | ICD-10-CM | POA: Insufficient documentation

## 2018-07-11 DIAGNOSIS — R202 Paresthesia of skin: Secondary | ICD-10-CM | POA: Diagnosis not present

## 2018-07-11 DIAGNOSIS — G43009 Migraine without aura, not intractable, without status migrainosus: Secondary | ICD-10-CM | POA: Insufficient documentation

## 2018-07-11 DIAGNOSIS — Z85828 Personal history of other malignant neoplasm of skin: Secondary | ICD-10-CM | POA: Diagnosis not present

## 2018-07-11 DIAGNOSIS — S79912A Unspecified injury of left hip, initial encounter: Secondary | ICD-10-CM | POA: Diagnosis not present

## 2018-07-11 DIAGNOSIS — N183 Chronic kidney disease, stage 3 (moderate): Secondary | ICD-10-CM | POA: Insufficient documentation

## 2018-07-11 DIAGNOSIS — F418 Other specified anxiety disorders: Secondary | ICD-10-CM | POA: Insufficient documentation

## 2018-07-11 DIAGNOSIS — Y9301 Activity, walking, marching and hiking: Secondary | ICD-10-CM | POA: Insufficient documentation

## 2018-07-11 DIAGNOSIS — M25552 Pain in left hip: Secondary | ICD-10-CM | POA: Diagnosis not present

## 2018-07-11 DIAGNOSIS — Z7901 Long term (current) use of anticoagulants: Secondary | ICD-10-CM | POA: Insufficient documentation

## 2018-07-11 DIAGNOSIS — S3992XA Unspecified injury of lower back, initial encounter: Secondary | ICD-10-CM | POA: Diagnosis not present

## 2018-07-11 DIAGNOSIS — M797 Fibromyalgia: Secondary | ICD-10-CM | POA: Diagnosis not present

## 2018-07-11 DIAGNOSIS — M25551 Pain in right hip: Secondary | ICD-10-CM | POA: Diagnosis not present

## 2018-07-11 DIAGNOSIS — S3210XA Unspecified fracture of sacrum, initial encounter for closed fracture: Secondary | ICD-10-CM | POA: Diagnosis not present

## 2018-07-11 DIAGNOSIS — I129 Hypertensive chronic kidney disease with stage 1 through stage 4 chronic kidney disease, or unspecified chronic kidney disease: Secondary | ICD-10-CM | POA: Insufficient documentation

## 2018-07-11 DIAGNOSIS — W010XXA Fall on same level from slipping, tripping and stumbling without subsequent striking against object, initial encounter: Secondary | ICD-10-CM | POA: Insufficient documentation

## 2018-07-11 DIAGNOSIS — I481 Persistent atrial fibrillation: Secondary | ICD-10-CM | POA: Diagnosis not present

## 2018-07-11 DIAGNOSIS — M542 Cervicalgia: Secondary | ICD-10-CM | POA: Diagnosis not present

## 2018-07-11 DIAGNOSIS — S79911A Unspecified injury of right hip, initial encounter: Secondary | ICD-10-CM | POA: Diagnosis not present

## 2018-07-11 DIAGNOSIS — M533 Sacrococcygeal disorders, not elsewhere classified: Secondary | ICD-10-CM | POA: Diagnosis not present

## 2018-07-11 DIAGNOSIS — R0902 Hypoxemia: Secondary | ICD-10-CM | POA: Diagnosis not present

## 2018-07-11 DIAGNOSIS — R569 Unspecified convulsions: Secondary | ICD-10-CM | POA: Diagnosis not present

## 2018-07-11 DIAGNOSIS — G4730 Sleep apnea, unspecified: Secondary | ICD-10-CM | POA: Insufficient documentation

## 2018-07-11 DIAGNOSIS — E538 Deficiency of other specified B group vitamins: Secondary | ICD-10-CM | POA: Insufficient documentation

## 2018-07-11 DIAGNOSIS — S0990XA Unspecified injury of head, initial encounter: Secondary | ICD-10-CM | POA: Diagnosis not present

## 2018-07-11 DIAGNOSIS — E669 Obesity, unspecified: Secondary | ICD-10-CM | POA: Diagnosis not present

## 2018-07-11 DIAGNOSIS — T07XXXA Unspecified multiple injuries, initial encounter: Secondary | ICD-10-CM | POA: Diagnosis not present

## 2018-07-11 DIAGNOSIS — Z79899 Other long term (current) drug therapy: Secondary | ICD-10-CM | POA: Insufficient documentation

## 2018-07-11 DIAGNOSIS — W19XXXA Unspecified fall, initial encounter: Secondary | ICD-10-CM

## 2018-07-11 DIAGNOSIS — S32111A Minimally displaced Zone I fracture of sacrum, initial encounter for closed fracture: Secondary | ICD-10-CM

## 2018-07-11 LAB — COMPREHENSIVE METABOLIC PANEL
ALT: 21 U/L (ref 0–44)
AST: 38 U/L (ref 15–41)
Albumin: 3.7 g/dL (ref 3.5–5.0)
Alkaline Phosphatase: 57 U/L (ref 38–126)
Anion gap: 9 (ref 5–15)
BUN: 8 mg/dL (ref 8–23)
CO2: 26 mmol/L (ref 22–32)
Calcium: 9.2 mg/dL (ref 8.9–10.3)
Chloride: 105 mmol/L (ref 98–111)
Creatinine, Ser: 0.82 mg/dL (ref 0.44–1.00)
GFR calc Af Amer: >60 mL/min (ref >60)
GFR calc non Af Amer: >60 mL/min (ref >60)
Glucose, Bld: 98 mg/dL (ref 70–99)
Potassium: 4 mmol/L (ref 3.5–5.1)
Sodium: 140 mmol/L (ref 135–145)
Total Bilirubin: 0.9 mg/dL (ref 0.3–1.2)
Total Protein: 7.1 g/dL (ref 6.5–8.1)

## 2018-07-11 LAB — CBC WITH DIFFERENTIAL/PLATELET
Abs Immature Granulocytes: 0 10*3/uL (ref 0.0–0.1)
Basophils Absolute: 0 10*3/uL (ref 0.0–0.1)
Basophils Relative: 1 %
Eosinophils Absolute: 0.1 10*3/uL (ref 0.0–0.7)
Eosinophils Relative: 2 %
HCT: 44 % (ref 36.0–46.0)
Hemoglobin: 13.5 g/dL (ref 12.0–15.0)
Immature Granulocytes: 0 %
Lymphocytes Relative: 27 %
Lymphs Abs: 1.2 10*3/uL (ref 0.7–4.0)
MCH: 29.7 pg (ref 26.0–34.0)
MCHC: 30.7 g/dL (ref 30.0–36.0)
MCV: 96.9 fL (ref 78.0–100.0)
Monocytes Absolute: 0.4 10*3/uL (ref 0.1–1.0)
Monocytes Relative: 8 %
Neutro Abs: 2.9 10*3/uL (ref 1.7–7.7)
Neutrophils Relative %: 62 %
Platelets: 147 10*3/uL — ABNORMAL LOW (ref 150–400)
RBC: 4.54 MIL/uL (ref 3.87–5.11)
RDW: 13.5 % (ref 11.5–15.5)
WBC: 4.6 10*3/uL (ref 4.0–10.5)

## 2018-07-11 LAB — PROTIME-INR
INR: 2.18
Prothrombin Time: 24 seconds — ABNORMAL HIGH (ref 11.4–15.2)

## 2018-07-11 MED ORDER — ONDANSETRON HCL 4 MG PO TABS: 4.0000 mg | ORAL_TABLET | Freq: Four times a day (QID) | ORAL | Status: DC | PRN

## 2018-07-11 MED ORDER — GABAPENTIN 600 MG PO TABS
600.0000 mg | ORAL_TABLET | Freq: Once | ORAL | Status: AC
  Administered 2018-07-11: 600 mg via ORAL
  Filled 2018-07-11: qty 1

## 2018-07-11 MED ORDER — HYDROCODONE-ACETAMINOPHEN 5-325 MG PO TABS
1.0000 | ORAL_TABLET | Freq: Four times a day (QID) | ORAL | Status: DC | PRN
  Administered 2018-07-11 – 2018-07-12 (×2): 1 via ORAL
  Filled 2018-07-11 (×2): qty 1

## 2018-07-11 MED ORDER — SODIUM CHLORIDE 0.9 % IV SOLN: INTRAVENOUS | Status: DC

## 2018-07-11 MED ORDER — DILTIAZEM HCL ER COATED BEADS 120 MG PO CP24
120.0000 mg | ORAL_CAPSULE | Freq: Every day | ORAL | Status: DC
  Administered 2018-07-12: 120 mg via ORAL
  Filled 2018-07-11: qty 1

## 2018-07-11 MED ORDER — ONDANSETRON HCL 4 MG/2ML IJ SOLN: 4.0000 mg | Freq: Four times a day (QID) | INTRAMUSCULAR | Status: DC | PRN

## 2018-07-11 MED ORDER — WARFARIN - PHARMACIST DOSING INPATIENT: Freq: Every day | Status: DC

## 2018-07-11 MED ORDER — GABAPENTIN 600 MG PO TABS
600.0000 mg | ORAL_TABLET | Freq: Three times a day (TID) | ORAL | Status: DC
  Administered 2018-07-11 – 2018-07-12 (×3): 600 mg via ORAL
  Filled 2018-07-11 (×3): qty 1

## 2018-07-11 MED ORDER — DULOXETINE HCL 20 MG PO CPEP
40.0000 mg | ORAL_CAPSULE | Freq: Every day | ORAL | Status: DC
  Administered 2018-07-12: 40 mg via ORAL
  Filled 2018-07-11: qty 2

## 2018-07-11 MED ORDER — ACETAMINOPHEN 500 MG PO TABS
500.0000 mg | ORAL_TABLET | Freq: Once | ORAL | Status: AC
  Administered 2018-07-11: 500 mg via ORAL

## 2018-07-11 MED ORDER — METHOCARBAMOL 750 MG PO TABS
750.0000 mg | ORAL_TABLET | Freq: Three times a day (TID) | ORAL | Status: DC
  Administered 2018-07-12 (×2): 750 mg via ORAL
  Filled 2018-07-11 (×3): qty 1

## 2018-07-11 MED ORDER — LAMOTRIGINE 25 MG PO TABS
25.0000 mg | ORAL_TABLET | Freq: Two times a day (BID) | ORAL | Status: DC
  Administered 2018-07-11 – 2018-07-12 (×2): 25 mg via ORAL
  Filled 2018-07-11 (×3): qty 1

## 2018-07-11 NOTE — ED Triage Notes (Signed)
Pt in via EMS to triage, pt tripped on her cane and fell, pt landed on her back and hit her head, pt on blood thinner, pt has history of back problems and a pelvic fx in the past and is concerned she has done the same again, c/o right sided headache and numbness to the right side of her face

## 2018-07-11 NOTE — ED Notes (Signed)
Pt needed two person assist to get out of the bed and ambulate. Pt in a lot of pain while moving, PA notified.

## 2018-07-11 NOTE — H&P (Addendum)
TRH H&P    Patient Demographics:    Debbie [name], is a 72 y.o. female  MRN: [id number]  DOB - 09/21/1946  Admit Date - 07/11/2018    Outpatient Primary MD for the patient is [name], [name], MD  Patient coming from: Home  Chief complaint-fall   HPI:    Debbie [name]  is a 72 y.o. female, with history of atrial fibrillation, on chronic anticoagulation with Coumadin, benign essential tremor, migraine, depression with anxiety, hypertension, history of partial seizures came to hospital after patient tripped and fell backwards onto her back while walking with her cane.  Patient admitted hitting her head on the ground and also complained of pain in the tailbone. She denied passing out.  No nausea vomiting or diarrhea.  No chest pain or shortness of breath. Denies numbness or tingling the extremities.  In the ED, x-ray of the sacrum/coccyx showed slightly displaced fracture of the fifth sacral segment visible only on the lateral view    Review of systems:      All other systems reviewed and are negative.   With Past History of the following :    Past Medical History:  Diagnosis Date  . Atrial fibrillation ([place])   . Benign essential tremor   . Breast cancer ([place])   . Chronic renal insufficiency   . Common migraine 05/07/2015  . Depression with anxiety   . Drowsiness    Excessive daytime  . Dyslipidemia   . Fibromyalgia   . History of partial seizures   . Hypertension   . Memory disturbance   . Muscle tension headache   . Obesity   . Obstructive sleep apnea   . S/P epidural steroid injection   . Seizures ([place])    Partial  . Sprain of neck 06/26/2013  . Tremors of nervous system   . Vitamin B12 deficiency       Past Surgical History:  Procedure Laterality Date  . ABDOMINAL HYSTERECTOMY    . ablation procedure     Cardiac, for Afib  . BASAL CELL CARCINOMA EXCISION    . BASAL CELL  CARCINOMA EXCISION    . BRAIN MENINGIOMA EXCISION Right   . BREAST RECONSTRUCTION Bilateral   . breast reconstructive    . CHOLECYSTECTOMY    . DILATION AND CURETTAGE OF UTERUS    . dilution    . ESOPHAGEAL MANOMETRY N/A 12/14/2016   Procedure: ESOPHAGEAL MANOMETRY (EM);  Surgeon: Mauri Pole, MD;  Location: [facility];  Service: Endoscopy;  Laterality: N/A;  . gallbladder resection    . LUMBAR SPINE SURGERY N/A   . lumbosacral    . mastectomies Bilateral   . NOSE SURGERY     Basal cell carcinoma resection from the nose  . TUBAL LIGATION N/A       Social History:      Social History   Tobacco Use  . Smoking status: Never Smoker  . Smokeless tobacco: Never Used  Substance Use Topics  . Alcohol use: No       Family History :  Family History  Problem Relation Age of Onset  . Heart failure Mother   . Diabetes Mother   . Arthritis Mother   . Hyperlipidemia Mother   . Heart disease Mother   . Stroke Mother   . Hypertension Mother   . Cancer - Colon Father   . Colon cancer Father        late 30's  . Arthritis Father   . Cancer Father   . Breast cancer Sister   . Diabetes Maternal Grandfather   . Heart failure Maternal Grandfather   . Stroke Maternal Grandmother   . Lung disease Neg Hx       Home Medications:   Prior to Admission medications   Medication Sig Start Date End Date Taking? Authorizing Provider  acetaminophen (TYLENOL) 500 MG tablet Take 1,000 mg by mouth every 6 (six) hours as needed.    [provider]  calcium carbonate (OS-CAL) 600 MG TABS tablet Take 600 mg by mouth daily with breakfast.    [provider]  diltiazem (CARDIZEM CD) 120 MG 24 hr capsule TAKE 1 CAPSULE BY MOUTH ONCE DAILY 07/10/18   [name], [name], MD  diltiazem (CARDIZEM) 30 MG tablet Take 1 tablet as needed for palpitations or heart rate greater than 110 02/10/17   End, Harrell Gave, MD  DULoxetine (CYMBALTA) 20 MG capsule Take 2 capsules (40 mg total)  by mouth daily. 03/12/18   [name], [name], MD  ergocalciferol (VITAMIN D2) 50000 units capsule 1 capsule every 10 days. 05/21/18   [name], [name], MD  gabapentin (NEURONTIN) 600 MG tablet Take 1 tablet (600 mg total) by mouth 3 (three) times daily. 06/18/18   Kathrynn Ducking, MD  HYDROcodone-acetaminophen (NORCO/VICODIN) 5-325 MG tablet Take 1 tablet by mouth 2 (two) times daily. 06/14/18   [provider]  lamoTRIgine (LAMICTAL) 25 MG tablet Take 1 tablet (25 mg total) by mouth 2 (two) times daily. 04/30/18   Kathrynn Ducking, MD  methocarbamol (ROBAXIN) 750 MG tablet Take 1 tablet (750 mg total) by mouth 3 (three) times daily. 02/12/18   Jamse Arn, MD  promethazine (PHENERGAN) 25 MG tablet Take 1 tablet (25 mg total) by mouth every 6 (six) hours as needed for nausea or vomiting. 06/18/18   Kathrynn Ducking, MD  vitamin B-12 (CYANOCOBALAMIN) 1000 MCG tablet Take 1,000 mcg by mouth daily.    [provider]  warfarin (COUMADIN) 5 MG tablet TAKE 1 TABLET BY MOUTH ONCE DAILY AS DIRECTED 05/14/18   End, Harrell Gave, MD  rivaroxaban (XARELTO) 20 MG TABS tablet Take 1 tablet (20 mg total) by mouth daily with supper. 12/22/16 10/19/17  Nelva Bush, MD     Allergies:     Allergies  Allergen Reactions  . Diclofenac Shortness Of Breath  . Atorvastatin Other (See Comments)    Other reaction(s): Myalgias (intolerance)  . Tape Other (See Comments)    Peels skin off (bandaids, too) unknown  . Tramadol Other (See Comments) and Nausea And Vomiting  . Buprenorphine Hcl Nausea And Vomiting  . Codeine Nausea And Vomiting  . Fish Oil Nausea And Vomiting  . Morphine And Related Nausea And Vomiting     Physical Exam:   Vitals  Blood pressure (!) 153/69, pulse 60, temperature 98.1 F (36.7 C), temperature source Oral, resp. rate 18, SpO2 93 %.  1.  General: Appears in no acute distress  2. Psychiatric:  Intact judgement and  insight, awake alert, oriented x 3.  3.  Neurologic:  No focal neurological deficits, all cranial nerves intact.Strength 5/5 all 4 extremities, sensation intact all 4 extremities, plantars down going.  4. Eyes :  anicteric sclerae, moist conjunctivae with no lid lag. PERRLA.  5. ENMT:  Oropharynx clear with moist mucous membranes and good dentition  6. Neck:  supple, no cervical lymphadenopathy appriciated, No thyromegaly  7. Respiratory : Normal respiratory effort, good air movement bilaterally,clear to  auscultation bilaterally  8. Cardiovascular : RRR, no gallops, rubs or murmurs, no leg edema  9. Gastrointestinal:  Positive bowel sounds, abdomen soft, non-tender to palpation,no hepatosplenomegaly, no rigidity or guarding       10. Skin:  No cyanosis, normal texture and turgor, no rash, lesions or ulcers  11.  Musculoskeletal: Tenderness noted at sacrum, no limitation of range of motion lower extremities.  Sensation is intact.  Motor strength 5/5 in both lower extremities.     Data Review:    CBC Recent Labs  Lab 07/11/18 1300  WBC 4.6  HGB 13.5  HCT 44.0  PLT 147*  MCV 96.9  MCH 29.7  MCHC 30.7  RDW 13.5  LYMPHSABS 1.2  MONOABS 0.4  EOSABS 0.1  BASOSABS 0.0   ------------------------------------------------------------------------------------------------------------------  Results for orders placed or performed during the hospital encounter of 07/11/18 (from the past 48 hour(s))  Protime-INR     Status: Abnormal   Collection Time: 07/11/18  1:00 PM  Result Value Ref Range   Prothrombin Time 24.0 (H) 11.4 - 15.2 seconds   INR 2.18     Comment: Performed at [facility], [place] [address] [place], [place] [address]  CBC with Differential     Status: Abnormal   Collection Time: 07/11/18  1:00 PM  Result Value Ref Range   WBC 4.6 4.0 - 10.5 K/uL   RBC 4.54 3.87 - 5.11 MIL/uL   Hemoglobin 13.5 12.0 - 15.0 [name]/dL   HCT 44.0 36.0 - 46.0 %   MCV 96.9 78.0 - 100.0 fL   MCH 29.7 26.0 - 34.0 pg    MCHC 30.7 30.0 - 36.0 [name]/dL   RDW 13.5 11.5 - 15.5 %   Platelets 147 (L) 150 - 400 K/uL   Neutrophils Relative % 62 %   Neutro Abs 2.9 1.7 - 7.7 K/uL   Lymphocytes Relative 27 %   Lymphs Abs 1.2 0.7 - 4.0 K/uL   Monocytes Relative 8 %   Monocytes Absolute 0.4 0.1 - 1.0 K/uL   Eosinophils Relative 2 %   Eosinophils Absolute 0.1 0.0 - 0.7 K/uL   Basophils Relative 1 %   Basophils Absolute 0.0 0.0 - 0.1 K/uL   Immature Granulocytes 0 %   Abs Immature Granulocytes 0.0 0.0 - 0.1 K/uL    Comment: Performed at [facility] [address], [place], [place] [address]  Comprehensive metabolic panel     Status: None   Collection Time: 07/11/18  1:00 PM  Result Value Ref Range   Sodium 140 135 - 145 mmol/L   Potassium 4.0 3.5 - 5.1 mmol/L   Chloride 105 98 - 111 mmol/L   CO2 26 22 - 32 mmol/L   Glucose, Bld 98 70 - 99 mg/dL   BUN 8 8 - 23 mg/dL   Creatinine, Ser 0.82 0.44 - 1.00 mg/dL   Calcium 9.2 8.9 - 10.3 mg/dL   Total Protein 7.1 6.5 - 8.1 [name]/dL   Albumin 3.7 3.5 - 5.0 [name]/dL   AST 38 15 - 41 U/L   ALT  21 0 - 44 U/L   Alkaline Phosphatase 57 38 - 126 U/L   Total Bilirubin 0.9 0.3 - 1.2 mg/dL   GFR calc non Af Amer >60 >60 mL/min   GFR calc Af Amer >60 >60 mL/min    Comment: (NOTE) The eGFR has been calculated using the CKD EPI equation. This calculation has not been validated in all clinical situations. eGFR's persistently <60 mL/min signify possible Chronic Kidney Disease.    Anion gap 9 5 - 15    Comment: Performed at [facility] [address] [place], [place] [address]    Chemistries  Recent Labs  Lab 07/11/18 1300  NA 140  K 4.0  CL 105  CO2 26  GLUCOSE 98  BUN 8  CREATININE 0.82  CALCIUM 9.2  AST 38  ALT 21  ALKPHOS 57  BILITOT 0.9    ------------------------------------------------------------------------------------------------------------------  ------------------------------------------------------------------------------------------------------------------ GFR: CrCl cannot be calculated (Unknown ideal weight.). Liver Function Tests: Recent Labs  Lab 07/11/18 1300  AST 38  ALT 21  ALKPHOS 57  BILITOT 0.9  PROT 7.1  ALBUMIN 3.7   No results for input(s): LIPASE, AMYLASE in the last 168 hours. No results for input(s): AMMONIA in the last 168 hours. Coagulation Profile: Recent Labs  Lab 07/11/18 1300  INR 2.18   Cardiac Enzymes: No results for input(s): CKTOTAL, CKMB, CKMBINDEX, TROPONINI in the last 168 hours. BNP (last 3 results) No results for input(s): PROBNP in the last 8760 hours. HbA1C: No results for input(s): HGBA1C in the last 72 hours. CBG: No results for input(s): GLUCAP in the last 168 hours. Lipid Profile: No results for input(s): CHOL, HDL, LDLCALC, TRIG, CHOLHDL, LDLDIRECT in the last 72 hours. Thyroid Function Tests: No results for input(s): TSH, T4TOTAL, FREET4, T3FREE, THYROIDAB in the last 72 hours. Anemia Panel: No results for input(s): VITAMINB12, FOLATE, FERRITIN, TIBC, IRON, RETICCTPCT in the last 72 hours.  --------------------------------------------------------------------------------------------------------------- Urine analysis:    Component Value Date/Time   COLORURINE YELLOW 12/23/2015 0058   APPEARANCEUR CLEAR 12/23/2015 0058   LABSPEC 1.011 12/23/2015 0058   PHURINE 7.5 12/23/2015 0058   GLUCOSEU NEGATIVE 12/23/2015 0058   HGBUR NEGATIVE 12/23/2015 0058   BILIRUBINUR NEGATIVE 12/23/2015 0058   KETONESUR NEGATIVE 12/23/2015 0058   PROTEINUR NEGATIVE 12/23/2015 0058   NITRITE NEGATIVE 12/23/2015 0058   LEUKOCYTESUR NEGATIVE 12/23/2015 0058      Imaging Results:    Dg Sacrum/coccyx  Result Date: 07/11/2018 CLINICAL DATA:  Sacral pain secondary to a fall  today. EXAM: SACRUM AND COCCYX - 2+ VIEW COMPARISON:  CT scan of the abdomen and pelvis dated 12/23/2015 FINDINGS: There appears to be a transverse fracture through the fifth sacral segment seen only on the lateral view. The remainder of the sacrum and coccyx are intact. IMPRESSION: Slightly displaced fracture of the fifth sacral segment visible only on the lateral view. Electronically Signed   By: Lorriane Shire M.D.   On: 07/11/2018 13:54   Ct Head Wo Contrast  Result Date: 07/11/2018 CLINICAL DATA:  72 year old female status post trip and fall, striking head. On blood thinners. EXAM: CT HEAD WITHOUT CONTRAST CT CERVICAL SPINE WITHOUT CONTRAST TECHNIQUE: Multidetector CT imaging of the head and cervical spine was performed following the standard protocol without intravenous contrast. Multiplanar CT image reconstructions of the cervical spine were also generated. COMPARISON:  None. FINDINGS: CT HEAD FINDINGS Brain: Small area of chronic encephalomalacia in the right superior parietal lobe (sagittal image 22). Elsewhere Gray-white matter differentiation is within normal  limits throughout the brain. Cerebral volume is within normal limits for age. No midline shift, ventriculomegaly, mass effect, evidence of mass lesion, intracranial hemorrhage or evidence of cortically based acute infarction. Vascular: Calcified atherosclerosis at the skull base. Skull: Generalized calvarium hyperostosis. Sequelae of right parietal convexity craniotomy. No acute osseous abnormality identified. Sinuses/Orbits: Paranasal sinuses and mastoids are well pneumatized. Other: No scalp hematoma. Visualized orbit soft tissues are within normal limits. CT CERVICAL SPINE FINDINGS Alignment: Preserved cervical lordosis. Cervicothoracic junction alignment is within normal limits. Bilateral posterior element alignment is within normal limits. Skull base and vertebrae: Visualized skull base is intact. No atlanto-occipital dissociation. Cervical  vertebrae appear intact. Soft tissues and spinal canal: No prevertebral fluid or swelling. No visible canal hematoma. Negative noncontrast neck soft tissues. Disc levels: Lower cervical disc space loss and endplate spurring. Intermittent cervical facet hypertrophy. Up to mild lower cervical spinal stenosis at C6-C7. Upper chest: Visible upper thoracic levels appear grossly intact. Benign vertebral body hemangioma in T2 on the left. Negative lung apices and noncontrast thoracic inlet. IMPRESSION: 1. No acute traumatic injury identified. In the head or cervical spine. 2. Previous right parietal craniotomy with mild underlying encephalomalacia. 3. 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 Electronically Signed   By: Genevie Ann M.D.   On: 07/11/2018 13:59   Ct Cervical Spine Wo Contrast  Result Date: 07/11/2018 CLINICAL DATA:  72 year old female status post trip and fall, striking head. On blood thinners. EXAM: CT HEAD WITHOUT CONTRAST CT CERVICAL SPINE WITHOUT CONTRAST TECHNIQUE: Multidetector CT imaging of the head and cervical spine was performed following the standard protocol without intravenous contrast. Multiplanar CT image reconstructions of the cervical spine were also generated. COMPARISON:  None. FINDINGS: CT HEAD FINDINGS Brain: Small area of chronic encephalomalacia in the right superior parietal lobe (sagittal image 22). Elsewhere Gray-white matter differentiation is within normal limits throughout the brain. Cerebral volume is within normal limits for age. No midline shift, ventriculomegaly, mass effect, evidence of mass lesion, intracranial hemorrhage or evidence of cortically based acute infarction. Vascular: Calcified atherosclerosis at the skull base. Skull: Generalized calvarium hyperostosis. Sequelae of right parietal convexity craniotomy. No acute osseous abnormality identified. Sinuses/Orbits: Paranasal sinuses and mastoids are well pneumatized. Other: No  scalp hematoma. Visualized orbit soft tissues are within normal limits. CT CERVICAL SPINE FINDINGS Alignment: Preserved cervical lordosis. Cervicothoracic junction alignment is within normal limits. Bilateral posterior element alignment is within normal limits. Skull base and vertebrae: Visualized skull base is intact. No atlanto-occipital dissociation. Cervical vertebrae appear intact. Soft tissues and spinal canal: No prevertebral fluid or swelling. No visible canal hematoma. Negative noncontrast neck soft tissues. Disc levels: Lower cervical disc space loss and endplate spurring. Intermittent cervical facet hypertrophy. Up to mild lower cervical spinal stenosis at C6-C7. Upper chest: Visible upper thoracic levels appear grossly intact. Benign vertebral body hemangioma in T2 on the left. Negative lung apices and noncontrast thoracic inlet. IMPRESSION: 1. No acute traumatic injury identified. In the head or cervical spine. 2. Previous right parietal craniotomy with mild underlying encephalomalacia. 3. 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 Electronically Signed   By: Genevie Ann M.D.   On: 07/11/2018 13:59   Dg Hips Bilat W Or Wo Pelvis 3-4 Views  Result Date: 07/11/2018 CLINICAL DATA:  Hip pain and sacrum pain secondary to a fall today. EXAM: DG HIP (WITH OR WITHOUT PELVIS) 3-4V BILAT COMPARISON:  None. FINDINGS: There is no evidence of hip fracture or  dislocation. There is no evidence of arthropathy or other focal bone abnormality. IMPRESSION: Negative. Electronically Signed   By: Lorriane Shire M.D.   On: 07/11/2018 13:52      Assessment & Plan:    Active Problems:   Sacral fracture, closed ([place])   1. Sacral fracture-x-ray of the sacrum shows slightly displaced fracture of the fifth sacral segment.  Orthopedics was consulted by ED physician.  Dr. Percell Miller will see the patient in a.m.  We will continue with Vicodin as needed for pain  2. Status post mechanical  fall-CT of the head and cervical spine showed no acute traumatic injury.  Continue pain control as above  3. History of atrial fibrillation-continue Cardizem, Coumadin.  INR is therapeutic at 2.18.  Will consult pharmacy to manage the dose of Coumadin.  4. History of seizures-continue Lamictal, gabapentin  5. Anxiety/depression-continue Cymbalta   DVT Prophylaxis-on anticoagulation with Coumadin  AM Labs Ordered, also please review Full Orders  Family Communication: Admission, patients condition and plan of care including tests being ordered have been discussed with the patient and her husband at bedside* who indicate understanding and agree with the plan and Code Status.  Code Status: Full code  Admission status: Observation  Time spent in minutes : 60 minutes   Oswald Hillock M.D on 07/11/2018 at 6:02 PM  Between 7am to 7pm - Pager - [phone]. After 7pm go to [email or web address] - password [facility]  [facility] - Office  [phone]

## 2018-07-11 NOTE — ED Provider Notes (Signed)
[facility] EMERGENCY DEPARTMENT Provider Note   CSN: [id number] Arrival date & time: 07/11/18  1238   History   Chief Complaint Chief Complaint  Patient presents with  . Fall    HPI Debbie [name] is a 72 y.o. female with a past medical history significant for A.fib, chronic back pain, chronic renal insufficiency and HTN who presents for evaluation of fall. States she was walking with her cane and tripped and fell backwards onto her back. Admits to hitting her head on the ground. Rates her pain a 5/10. Denies radiation of pain. Denies arrivating or alleviating factors. She is on anticoagulation for Atrial Fibrillation. Admits to headache. Denies dizziness, lightheadedness, nausea, vomiting, chest pain, SOB, abdominal pain, weakness, numbness, tingling in extremities.   HPI  Past Medical History:  Diagnosis Date  . Atrial fibrillation (Debbie [name])   . Benign essential tremor   . Breast cancer (Debbie [name])   . Chronic renal insufficiency   . Common migraine 05/07/2015  . Depression with anxiety   . Drowsiness    Excessive daytime  . Dyslipidemia   . Fibromyalgia   . History of partial seizures   . Hypertension   . Memory disturbance   . Muscle tension headache   . Obesity   . Obstructive sleep apnea   . S/P epidural steroid injection   . Seizures (Debbie [name])    Partial  . Sprain of neck 06/26/2013  . Tremors of nervous system   . Vitamin B12 deficiency     Patient Active Problem List   Diagnosis Date Noted  . Palpitations 04/20/2018  . Encounter for therapeutic drug monitoring 10/24/2017  . History of pulmonary embolism 10/20/2017  . Chronic lower back pain 09/08/2017  . Arthritis 06/19/2017  . Chest tightness or pressure 06/19/2017  . Claustrophobia 06/19/2017  . Diverticulitis 06/19/2017  . Headache 06/19/2017  . Heartburn 06/19/2017  . PONV (postoperative nausea and vomiting) 06/19/2017  . Persistent atrial fibrillation ([name]) 06/19/2017  . Nausea without  vomiting 05/08/2017  . Generalized anxiety disorder 03/07/2017  . Varicose veins of both lower extremities 02/11/2017  . Atypical chest pain 02/11/2017  . Constipation 01/06/2017  . Dysphagia   . Restrictive lung disease 12/16/2016  . Generalized osteoarthritis of multiple sites 12/09/2016  . SOB (shortness of breath) 11/18/2016  . Chronic kidney disease 11/18/2016  . Chronic pulmonary embolism ([name]) 09/06/2016  . Other pulmonary embolism without acute cor pulmonale ([name]) 09/05/2016  . Pericardial effusion 08/29/2016  . S/P ablation of atrial fibrillation 04/13/2016  . Chronic arthralgias of knees and hips 02/26/2016  . Thoracic radiculopathy 02/26/2016  . Anxiety 01/29/2016  . Depression 01/29/2016  . Esophageal reflux 01/29/2016  . Hyperlipidemia 01/29/2016  . Hypertension 01/29/2016  . Osteoarthritis 01/29/2016  . Osteopenia 01/29/2016  . Other intervertebral disc displacement, lumbar region 01/29/2016  . Tremor 01/29/2016  . 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 Spondylolisthesis of lumbar region 01/29/2016  . Seizure, partial (Debbie [name]) 01/29/2016  . Pure hypertriglyceridemia 01/29/2016  . Osteoarthritis of right foot 01/18/2016  . Fibromyalgia 11/13/2015  . Anticoagulant long-term use 10/28/2015  . Benign hypertension with CKD (chronic kidney disease), stage II 10/28/2015  . Mixed hyperlipidemia 10/28/2015  . Need for influenza vaccination 10/28/2015  . Recurrent major depressive disorder, in partial remission (Debbie [name]) 10/28/2015  . Chronic kidney disease, stage 3 (Debbie [name]) 07/29/2015  . Essential hypertension 05/12/2015  . Fatigue 05/12/2015  . Long term current use of anticoagulant 05/12/2015  . On amiodarone  therapy 05/12/2015  . Common migraine 05/07/2015  . Sprain of neck 06/26/2013  . Sleep apnea 02/04/2013  . Other general symptoms(780.99) 02/04/2013  . Other vitamin B12 deficiency anemia 02/04/2013  . Memory loss 02/04/2013  .  Chronic back pain 02/04/2013  . Seizures ([place]) 11/08/2011  . Cancer ([place]) 11/07/1982    Past Surgical History:  Procedure Laterality Date  . ABDOMINAL HYSTERECTOMY    . ablation procedure     Cardiac, for Afib  . BASAL CELL CARCINOMA EXCISION    . BASAL CELL CARCINOMA EXCISION    . BRAIN MENINGIOMA EXCISION Right   . BREAST RECONSTRUCTION Bilateral   . breast reconstructive    . CHOLECYSTECTOMY    . DILATION AND CURETTAGE OF UTERUS    . dilution    . ESOPHAGEAL MANOMETRY N/A 12/14/2016   Procedure: ESOPHAGEAL MANOMETRY (EM);  Surgeon: Debbie Pole, MD;  Location: [facility];  Service: Endoscopy;  Laterality: N/A;  . gallbladder resection    . LUMBAR SPINE SURGERY N/A   . lumbosacral    . mastectomies Bilateral   . NOSE SURGERY     Basal cell carcinoma resection from the nose  . TUBAL LIGATION N/A      OB History   None      Home Medications    Prior to Admission medications   Medication Sig Start Date End Date Taking? Authorizing Provider  acetaminophen (TYLENOL) 500 MG tablet Take 1,000 mg by mouth every 6 (six) hours as needed.    [provider]  calcium carbonate (OS-CAL) 600 MG TABS tablet Take 600 mg by mouth daily with breakfast.    [provider]  diltiazem (CARDIZEM CD) 120 MG 24 hr capsule TAKE 1 CAPSULE BY MOUTH ONCE DAILY 07/10/18   [name], [name], MD  diltiazem (CARDIZEM) 30 MG tablet Take 1 tablet as needed for palpitations or heart rate greater than 110 02/10/17   End, Debbie Gave, MD  DULoxetine (CYMBALTA) 20 MG capsule Take 2 capsules (40 mg total) by mouth daily. 03/12/18   [name], [name], MD  ergocalciferol (VITAMIN D2) 50000 units capsule 1 capsule every 10 days. 05/21/18   [name], [name], MD  gabapentin (NEURONTIN) 600 MG tablet Take 1 tablet (600 mg total) by mouth 3 (three) times daily. 06/18/18   Debbie Ducking, MD  HYDROcodone-acetaminophen (NORCO/VICODIN) 5-325 MG tablet Take 1 tablet by mouth 2 (two) times daily. 06/14/18    [provider]  lamoTRIgine (LAMICTAL) 25 MG tablet Take 1 tablet (25 mg total) by mouth 2 (two) times daily. 04/30/18   Debbie Ducking, MD  methocarbamol (ROBAXIN) 750 MG tablet Take 1 tablet (750 mg total) by mouth 3 (three) times daily. 02/12/18   Jamse Arn, MD  promethazine (PHENERGAN) 25 MG tablet Take 1 tablet (25 mg total) by mouth every 6 (six) hours as needed for nausea or vomiting. 06/18/18   Debbie Ducking, MD  vitamin B-12 (CYANOCOBALAMIN) 1000 MCG tablet Take 1,000 mcg by mouth daily.    [provider]  warfarin (COUMADIN) 5 MG tablet TAKE 1 TABLET BY MOUTH ONCE DAILY AS DIRECTED 05/14/18   End, Debbie Gave, MD  rivaroxaban (XARELTO) 20 MG TABS tablet Take 1 tablet (20 mg total) by mouth daily with supper. 12/22/16 10/19/17  End, Debbie Gave, MD    Family History Family History  Problem Relation Age of Onset  . Heart failure Mother   . Diabetes Mother   . Arthritis Mother   . Hyperlipidemia Mother   .  Heart disease Mother   . Stroke Mother   . Hypertension Mother   . Cancer - Colon Father   . Colon cancer Father        late 22's  . Arthritis Father   . Cancer Father   . Breast cancer Sister   . Diabetes Maternal Grandfather   . Heart failure Maternal Grandfather   . Stroke Maternal Grandmother   . Lung disease Neg Hx     Social History Social History   Tobacco Use  . Smoking status: Never Smoker  . Smokeless tobacco: Never Used  Substance Use Topics  . Alcohol use: No  . Drug use: No     Allergies   Diclofenac; Atorvastatin; Tape; Tramadol; Buprenorphine hcl; Codeine; Fish oil; and Morphine and related   Review of Systems Review of Systems  Review of system negative unless otherwise stated in the HPI.   Physical Exam Updated Vital Signs BP (!) 153/69   Pulse 60   Temp 98.1 F (36.7 C) (Oral)   Resp 18   SpO2 93%   Physical Exam  Constitutional: She appears well-developed and well-nourished. No distress.  HENT:    Head: Normocephalic.  Eyes: Pupils are equal, round, and reactive to light. EOM are normal.  Neck: Normal range of motion. Neck supple.  Cardiovascular: Normal rate, regular rhythm, normal heart sounds and intact distal pulses.  Pulmonary/Chest: Effort normal and breath sounds normal. No stridor. No respiratory distress. She has no wheezes. She has no rales. She exhibits no tenderness.  Abdominal: Soft. Bowel sounds are normal. She exhibits no distension and no mass. There is no tenderness. There is no rebound and no guarding. No hernia.  Genitourinary:  Genitourinary Comments: Normal anal sphincter tone.  Musculoskeletal: She exhibits no edema or deformity.  Tenderness to the sacrum. Speech is clear and goal oriented, follows commands Normal 5/5 strength in upper and lower extremities bilaterally including dorsiflexion and plantar flexion, strong and equal grip strength Sensation normal to light and sharp touch Moves extremities without ataxia, coordination intact No Clonus  Full range of motion of the T-spine and L-spine with flexion, hyperextension, and lateral flexion. No midline tenderness or stepoffs. No tenderness to palpation of the spinous processes of the T-spine or L-spine.   Neurological: She is alert.       Patellar reflexes are 2+ on the right side and 2+ on the left side.      Achilles reflexes are 2+ on the right side and 2+ on the left side.  Skin: She is not diaphoretic.  Nursing note and vitals reviewed.   ED Treatments / Results  Labs (all labs ordered are listed, but only abnormal results are displayed) Labs Reviewed  PROTIME-INR - Abnormal; Notable for the following components:      Result Value   Prothrombin Time 24.0 (*)    All other components within normal limits  CBC WITH DIFFERENTIAL/PLATELET - Abnormal; Notable for the following components:   Platelets 147 (*)    All other components within normal limits  COMPREHENSIVE METABOLIC PANEL     EKG None  Radiology Dg Sacrum/coccyx  Result Date: 07/11/2018 CLINICAL DATA:  Sacral pain secondary to a fall today. EXAM: SACRUM AND COCCYX - 2+ VIEW COMPARISON:  CT scan of the abdomen and pelvis dated 12/23/2015 FINDINGS: There appears to be a transverse fracture through the fifth sacral segment seen only on the lateral view. The remainder of the sacrum and coccyx are intact. IMPRESSION: Slightly displaced fracture of  the fifth sacral segment visible only on the lateral view. Electronically Signed   By: Lorriane Shire M.D.   On: 07/11/2018 13:54   Ct Head Wo Contrast  Result Date: 07/11/2018 CLINICAL DATA:  72 year old female status post trip and fall, striking head. On blood thinners. EXAM: CT HEAD WITHOUT CONTRAST CT CERVICAL SPINE WITHOUT CONTRAST TECHNIQUE: Multidetector CT imaging of the head and cervical spine was performed following the standard protocol without intravenous contrast. Multiplanar CT image reconstructions of the cervical spine were also generated. COMPARISON:  None. FINDINGS: CT HEAD FINDINGS Brain: Small area of chronic encephalomalacia in the right superior parietal lobe (sagittal image 22). Elsewhere Gray-white matter differentiation is within normal limits throughout the brain. Cerebral volume is within normal limits for age. No midline shift, ventriculomegaly, mass effect, evidence of mass lesion, intracranial hemorrhage or evidence of cortically based acute infarction. Vascular: Calcified atherosclerosis at the skull base. Skull: Generalized calvarium hyperostosis. Sequelae of right parietal convexity craniotomy. No acute osseous abnormality identified. Sinuses/Orbits: Paranasal sinuses and mastoids are well pneumatized. Other: No scalp hematoma. Visualized orbit soft tissues are within normal limits. CT CERVICAL SPINE FINDINGS Alignment: Preserved cervical lordosis. Cervicothoracic junction alignment is within normal limits. Bilateral posterior element alignment is  within normal limits. Skull base and vertebrae: Visualized skull base is intact. No atlanto-occipital dissociation. Cervical vertebrae appear intact. Soft tissues and spinal canal: No prevertebral fluid or swelling. No visible canal hematoma. Negative noncontrast neck soft tissues. Disc levels: Lower cervical disc space loss and endplate spurring. Intermittent cervical facet hypertrophy. Up to mild lower cervical spinal stenosis at C6-C7. Upper chest: Visible upper thoracic levels appear grossly intact. Benign vertebral body hemangioma in T2 on the left. Negative lung apices and noncontrast thoracic inlet. IMPRESSION: 1. No acute traumatic injury identified. In the head or cervical spine. 2. Previous right parietal craniotomy with mild underlying encephalomalacia. 3. 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 Electronically Signed   By: Genevie Ann M.D.   On: 07/11/2018 13:59   Ct Cervical Spine Wo Contrast  Result Date: 07/11/2018 CLINICAL DATA:  72 year old female status post trip and fall, striking head. On blood thinners. EXAM: CT HEAD WITHOUT CONTRAST CT CERVICAL SPINE WITHOUT CONTRAST TECHNIQUE: Multidetector CT imaging of the head and cervical spine was performed following the standard protocol without intravenous contrast. Multiplanar CT image reconstructions of the cervical spine were also generated. COMPARISON:  None. FINDINGS: CT HEAD FINDINGS Brain: Small area of chronic encephalomalacia in the right superior parietal lobe (sagittal image 22). Elsewhere Gray-white matter differentiation is within normal limits throughout the brain. Cerebral volume is within normal limits for age. No midline shift, ventriculomegaly, mass effect, evidence of mass lesion, intracranial hemorrhage or evidence of cortically based acute infarction. Vascular: Calcified atherosclerosis at the skull base. Skull: Generalized calvarium hyperostosis. Sequelae of right parietal convexity craniotomy.  No acute osseous abnormality identified. Sinuses/Orbits: Paranasal sinuses and mastoids are well pneumatized. Other: No scalp hematoma. Visualized orbit soft tissues are within normal limits. CT CERVICAL SPINE FINDINGS Alignment: Preserved cervical lordosis. Cervicothoracic junction alignment is within normal limits. Bilateral posterior element alignment is within normal limits. Skull base and vertebrae: Visualized skull base is intact. No atlanto-occipital dissociation. Cervical vertebrae appear intact. Soft tissues and spinal canal: No prevertebral fluid or swelling. No visible canal hematoma. Negative noncontrast neck soft tissues. Disc levels: Lower cervical disc space loss and endplate spurring. Intermittent cervical facet hypertrophy. Up to mild lower cervical spinal stenosis at C6-C7. Upper chest: Visible upper  thoracic levels appear grossly intact. Benign vertebral body hemangioma in T2 on the left. Negative lung apices and noncontrast thoracic inlet. IMPRESSION: 1. No acute traumatic injury identified. In the head or cervical spine. 2. Previous right parietal craniotomy with mild underlying encephalomalacia. 3. 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 Electronically Signed   By: Genevie Ann M.D.   On: 07/11/2018 13:59   Dg Hips Bilat W Or Wo Pelvis 3-4 Views  Result Date: 07/11/2018 CLINICAL DATA:  Hip pain and sacrum pain secondary to a fall today. EXAM: DG HIP (WITH OR WITHOUT PELVIS) 3-4V BILAT COMPARISON:  None. FINDINGS: There is no evidence of hip fracture or dislocation. There is no evidence of arthropathy or other focal bone abnormality. IMPRESSION: Negative. Electronically Signed   By: Lorriane Shire M.D.   On: 07/11/2018 13:52    Procedures Procedures (including critical care time)  Medications Ordered in ED Medications  acetaminophen (TYLENOL) tablet 500 mg (500 mg Oral Given 07/11/18 1721)  gabapentin (NEURONTIN) tablet 600 mg (600 mg Oral Given  07/11/18 1721)     Initial Impression / Assessment and Plan / ED Course  I have reviewed the triage vital signs and the nursing notes as well as the past medical history.  Pertinent labs & imaging results that were available during my care of the patient were reviewed by me and considered in my medical decision making (see chart for details).  Presents for evaluation post mechanical fall. Head, neck and sacrum pain. Afebrile, non-ill, non-septic appearing.  Normal neuro exam without neuro deficits. Will obtain plain films and CT head to r/o hemorrhage, dislocation and fracture.  Plain films with displaced fracture of the 5th sacral segment. CT negative for bleed. Reevalaution patient requesting pain medications. Patient unable to ambulate with assistance in ED. Will consult with Ortho and hospitalists for possible admission.  Consulted with Orthopedics Dr. Percell Miller, he agrees to see patient tomorrow in hospital.  Will consult Hospitalists for admission.  1720: Hospitalist Dr. Darrick Meigs agrees for admission.  Patient was seen and evaluated by my attending Dr. Sedonia Small, who agrees with the treatment plan and disposition of the patient.   Final Clinical Impressions(s) / ED Diagnoses   Final diagnoses:  Fall, initial encounter  Closed fracture of sacrum, unspecified portion of sacrum, initial encounter [facility])    ED Discharge Orders    None       Henderly, Britni A, PA-C 07/11/18 1818    Maudie Flakes, MD 07/11/18 2235

## 2018-07-11 NOTE — Progress Notes (Signed)
ANTICOAGULATION CONSULT NOTE - Initial Consult  Pharmacy Consult for warfarin Indication: atrial fibrillation  Allergies  Allergen Reactions  . Diclofenac Shortness Of Breath  . Atorvastatin Other (See Comments)    Muscle pain  . Tape Other (See Comments)    Peels skin off (bandaids, too) unknown  . Mushroom Extract Complex Nausea And Vomiting  . Tramadol Other (See Comments) and Nausea And Vomiting  . Buprenorphine Hcl Nausea And Vomiting  . Codeine Nausea And Vomiting  . Fish Oil Nausea And Vomiting  . Morphine And Related Nausea And Vomiting    Patient Measurements:   Heparin Dosing Weight:   Vital Signs: Temp: 98.1 F (36.7 C) (09/04 1301) Temp Source: Oral (09/04 1301) BP: 153/69 (09/04 1630) Pulse Rate: 60 (09/04 1630)  Labs: Recent Labs    07/11/18 1300  HGB 13.5  HCT 44.0  PLT 147*  LABPROT 24.0*  INR 2.18  CREATININE 0.82    CrCl cannot be calculated (Unknown ideal weight.).   Medical History: Past Medical History:  Diagnosis Date  . Atrial fibrillation ([place])   . Benign essential tremor   . Breast cancer ([place])   . Chronic renal insufficiency   . Common migraine 05/07/2015  . Depression with anxiety   . Drowsiness    Excessive daytime  . Dyslipidemia   . Fibromyalgia   . History of partial seizures   . Hypertension   . Memory disturbance   . Muscle tension headache   . Obesity   . Obstructive sleep apnea   . S/P epidural steroid injection   . Seizures ([place])    Partial  . Sprain of neck 06/26/2013  . Tremors of nervous system   . Vitamin B12 deficiency     Medications:    Assessment: 72 yo F on chronic anticoagulation with warfarin for atrial fibrillation. Admitted 9/4 after falling on her tailbone and hitting head on ground. X-ray in ED shows fracture of sacral segment. Patient denies any bleeding other than some bruising on the back of her neck. INR on admit was 2.18.   PTA Warfarin: 5mg  daily, except 2.5mg  on Sundays   Goal of  Therapy:  INR 2-3 Monitor platelets by anticoagulation protocol: Yes   Plan:  Will hold warfarin tonight due to concern for bleeding Monitor clinical status and s/s bleeding Daily INRs  Debbie [name] 07/11/2018,6:26 PM

## 2018-07-11 NOTE — Progress Notes (Addendum)
23:26 Patient unable to void. 442 ml on Bladders scan. Text paged NP Baltazar Najjar. Orders received for in and out cath. 600 ml of clear yellow urine drained out of bladder. Patient stated to nurse " I feel relieved".   07:27 Bladder Scan 524 ml. Patient tried several times to void but was not successful   In and out cath 591ml clear yellow urine  Drained.

## 2018-07-12 DIAGNOSIS — S32131A Minimally displaced Zone III fracture of sacrum, initial encounter for closed fracture: Secondary | ICD-10-CM

## 2018-07-12 DIAGNOSIS — S3210XA Unspecified fracture of sacrum, initial encounter for closed fracture: Secondary | ICD-10-CM | POA: Diagnosis not present

## 2018-07-12 DIAGNOSIS — S32110A Nondisplaced Zone I fracture of sacrum, initial encounter for closed fracture: Secondary | ICD-10-CM | POA: Diagnosis not present

## 2018-07-12 LAB — COMPREHENSIVE METABOLIC PANEL
ALT: 19 U/L (ref 0–44)
AST: 30 U/L (ref 15–41)
Albumin: 3.4 g/dL — ABNORMAL LOW (ref 3.5–5.0)
Alkaline Phosphatase: 49 U/L (ref 38–126)
Anion gap: 8 (ref 5–15)
BUN: 9 mg/dL (ref 8–23)
CO2: 27 mmol/L (ref 22–32)
Calcium: 8.8 mg/dL — ABNORMAL LOW (ref 8.9–10.3)
Chloride: 105 mmol/L (ref 98–111)
Creatinine, Ser: 0.8 mg/dL (ref 0.44–1.00)
GFR calc Af Amer: >60 mL/min (ref >60)
GFR calc non Af Amer: >60 mL/min (ref >60)
Glucose, Bld: 98 mg/dL (ref 70–99)
Potassium: 3.9 mmol/L (ref 3.5–5.1)
Sodium: 140 mmol/L (ref 135–145)
Total Bilirubin: 1.2 mg/dL (ref 0.3–1.2)
Total Protein: 6.7 g/dL (ref 6.5–8.1)

## 2018-07-12 LAB — CBC
HCT: 41.3 % (ref 36.0–46.0)
Hemoglobin: 13 g/dL (ref 12.0–15.0)
MCH: 30.3 pg (ref 26.0–34.0)
MCHC: 31.5 g/dL (ref 30.0–36.0)
MCV: 96.3 fL (ref 78.0–100.0)
Platelets: 143 10*3/uL — ABNORMAL LOW (ref 150–400)
RBC: 4.29 MIL/uL (ref 3.87–5.11)
RDW: 13.7 % (ref 11.5–15.5)
WBC: 4.2 10*3/uL (ref 4.0–10.5)

## 2018-07-12 LAB — PROTIME-INR
INR: 1.99
Prothrombin Time: 22.5 seconds — ABNORMAL HIGH (ref 11.4–15.2)

## 2018-07-12 MED ORDER — ACETAMINOPHEN 325 MG PO TABS
650.0000 mg | ORAL_TABLET | Freq: Four times a day (QID) | ORAL | Status: DC
  Administered 2018-07-12: 650 mg via ORAL
  Filled 2018-07-12: qty 2

## 2018-07-12 MED ORDER — METOCLOPRAMIDE HCL 5 MG PO TABS: 5.0000 mg | ORAL_TABLET | Freq: Four times a day (QID) | ORAL | Status: DC

## 2018-07-12 MED ORDER — OXYCODONE HCL 10 MG PO TABS: 10.0000 mg | ORAL_TABLET | ORAL | 0 refills | Status: AC | PRN

## 2018-07-12 MED ORDER — WARFARIN SODIUM 7.5 MG PO TABS
7.5000 mg | ORAL_TABLET | Freq: Once | ORAL | Status: AC
  Administered 2018-07-12: 7.5 mg via ORAL
  Filled 2018-07-12: qty 1

## 2018-07-12 MED ORDER — ACETAMINOPHEN 325 MG PO TABS: 650.0000 mg | ORAL_TABLET | Freq: Four times a day (QID) | ORAL | Status: DC

## 2018-07-12 MED ORDER — OXYCODONE HCL 5 MG PO TABS: 5.0000 mg | ORAL_TABLET | ORAL | 0 refills | Status: AC | PRN

## 2018-07-12 MED ORDER — ONDANSETRON HCL 8 MG PO TABS: 8.0000 mg | ORAL_TABLET | Freq: Four times a day (QID) | ORAL | 0 refills | Status: DC

## 2018-07-12 MED ORDER — OXYCODONE HCL 5 MG PO TABS
5.0000 mg | ORAL_TABLET | ORAL | Status: DC | PRN
  Administered 2018-07-12: 5 mg via ORAL
  Filled 2018-07-12: qty 1

## 2018-07-12 MED ORDER — OXYCODONE HCL 5 MG PO TABS: 10.0000 mg | ORAL_TABLET | ORAL | Status: DC | PRN

## 2018-07-12 MED ORDER — ONDANSETRON HCL 4 MG PO TABS
8.0000 mg | ORAL_TABLET | Freq: Four times a day (QID) | ORAL | Status: DC
  Administered 2018-07-12: 8 mg via ORAL
  Filled 2018-07-12: qty 2

## 2018-07-12 MED ORDER — METOCLOPRAMIDE HCL 5 MG PO TABS
5.0000 mg | ORAL_TABLET | Freq: Four times a day (QID) | ORAL | Status: DC
  Administered 2018-07-12: 5 mg via ORAL
  Filled 2018-07-12: qty 1

## 2018-07-12 NOTE — Progress Notes (Signed)
ANTICOAGULATION CONSULT NOTE  Pharmacy Consult:  Coumadin Indication: atrial fibrillation  Allergies  Allergen Reactions  . Diclofenac Shortness Of Breath  . Atorvastatin Other (See Comments)    Muscle pain  . Tape Other (See Comments)    Peels skin off (bandaids, too) unknown  . Mushroom Extract Complex Nausea And Vomiting  . Tramadol Other (See Comments) and Nausea And Vomiting  . Buprenorphine Hcl Nausea And Vomiting  . Codeine Nausea And Vomiting  . Fish Oil Nausea And Vomiting  . Morphine And Related Nausea And Vomiting    Vital Signs: Temp: 98 F (36.7 C) (09/05 1307) Temp Source: Oral (09/05 1307) BP: 113/61 (09/05 1307) Pulse Rate: 59 (09/05 1307)  Labs: Recent Labs    09/04/Debbie 1300 09/05/Debbie 0447  HGB 13.5 13.0  HCT 44.0 41.3  PLT 147* 143*  LABPROT 24.0* 22.5*  INR 2.18 1.99  CREATININE 0.82 0.80    CrCl cannot be calculated (Unknown ideal weight.).    Assessment: Debbie [name] on Coumadin PTA for atrial fibrillation.  Patient presented after falling on her tailbone and hitting head on ground. X-ray in ED shows fracture of sacral segment and head CT negative for bleed.  Pharmacy consulted to continue Coumadin.  INR slightly sub-therapeutic.  No overt bleeding reported.  Home Coumadin regimen: 5mg  daily except 2.5mg  on Sun   Goal of Therapy:  INR 2-3 Monitor platelets by anticoagulation protocol: Yes    Plan:  Coumadin 7.5mg  PO today Daily PT / INR   Jackalynn Art D. Mina Marble, PharmD, BCPS, [place] 07/12/2018, 1:53 PM

## 2018-07-12 NOTE — Social Work (Signed)
CSW acknowledging consult for SNF, pt discharging home with home health and spouse.   CSW signing off. Please consult if any additional needs arise.  [place], [place] [facility] [phone]

## 2018-07-12 NOTE — Discharge Instructions (Signed)

## 2018-07-12 NOTE — Progress Notes (Signed)
Discharge  Pt was able to participate with discharge teaching with spouse at the bedside. Pt was informed of medication changes and follow-up appts. PIV removed and pt has no complaints of pain at this time. Awaiting wheelchair for d/c.

## 2018-07-12 NOTE — Evaluation (Signed)
Physical Therapy Evaluation Patient Details Name: Debbie [name] MRN: [id number] DOB: 04/15/1946 Today's Date: 07/12/2018   History of Present Illness  Pt is a 72 y/o female admitted following a fall. Found to have a 5th sacral segment fracture. CT of head and C-spine negative for acute abnormality. PMH includes a fib, essential tremor, breast cancer, fibromyalgia, HTN, seizures, and lumbar spine surgery.   Clinical Impression  Pt admitted secondary to problem above with deficits below. Pt tolerated mobility well this session and performed gait and stair training with min guard to supervision. Educated about using RW at home to increase safety. Pt reports husband and daughter can assist as needed at home and pt eager to be discharged. Will continue to follow acutely to maximize functional mobility independence and safety.     Follow Up Recommendations Home health PT;Supervision for mobility/OOB    Equipment Recommendations  None recommended by PT    Recommendations for Other Services       Precautions / Restrictions Precautions Precautions: Fall Restrictions Weight Bearing Restrictions: Yes RLE Weight Bearing: Weight bearing as tolerated LLE Weight Bearing: Weight bearing as tolerated      Mobility  Bed Mobility Overal bed mobility: Needs Assistance Bed Mobility: Rolling;Sidelying to Sit;Sit to Sidelying Rolling: Supervision Sidelying to sit: Supervision     Sit to sidelying: Supervision General bed mobility comments: Supervision for safety. Increased time required to perform, however, no physical assist required.   Transfers Overall transfer level: Needs assistance Equipment used: Rolling walker (2 wheeled) Transfers: Sit to/from Stand Sit to Stand: Min guard         General transfer comment: Min guard for safety. Verbal cues for safe hand placement.   Ambulation/Gait Ambulation/Gait assistance: Min guard;Supervision Gait Distance (Feet): 150 Feet Assistive  device: Rolling walker (2 wheeled) Gait Pattern/deviations: Step-through pattern;Decreased stride length Gait velocity: Decreased    General Gait Details: Slow, guarded gait. Min guard to supervision for safety. Educated to use RW for increased safety at home.   Stairs Stairs: Yes Stairs assistance: Min guard Stair Management: One rail Left;Step to pattern;Sideways Number of Stairs: 4 General stair comments: Slow, cautious stair navigation. Min guard for steadying assist. Verbal cues for LE sequencing.   Wheelchair Mobility    Modified Rankin (Stroke Patients Only)       Balance Overall balance assessment: Needs assistance Sitting-balance support: No upper extremity supported;Feet supported Sitting balance-Leahy Scale: Good     Standing balance support: Bilateral upper extremity supported;No upper extremity supported;During functional activity Standing balance-Leahy Scale: Fair Standing balance comment: Able to maintain static standing without UE support.                              Pertinent Vitals/Pain Pain Assessment: Faces Faces Pain Scale: Hurts little more Pain Location: sacrum  Pain Descriptors / Indicators: Aching Pain Intervention(s): Limited activity within patient's tolerance;Monitored during session;Repositioned    Home Living Family/patient expects to be discharged to:: Private residence Living Arrangements: Spouse/significant other;Children Available Help at Discharge: Family Type of Home: House Home Access: Stairs to enter Entrance Stairs-Rails: Right;Left;Can reach both Technical brewer of Steps: 4 Home Layout: One level Home Equipment: Environmental consultant - 2 wheels;Cane - quad;Bedside commode;Tub bench;Other (comment);Grab bars - tub/shower;Grab bars - toilet(adjustable bed)      Prior Function Level of Independence: Needs assistance   Gait / Transfers Assistance Needed: Used quad cane for ambulation   ADL's / Homemaking Assistance Needed:  Required some  assist for LE dressing.         Hand Dominance        Extremity/Trunk Assessment   Upper Extremity Assessment Upper Extremity Assessment: Generalized weakness    Lower Extremity Assessment Lower Extremity Assessment: Generalized weakness    Cervical / Trunk Assessment Cervical / Trunk Assessment: Other exceptions Cervical / Trunk Exceptions: sacral fracture   Communication   Communication: No difficulties  Cognition Arousal/Alertness: Awake/alert Behavior During Therapy: WFL for tasks assessed/performed Overall Cognitive Status: Within Functional Limits for tasks assessed                                        General Comments General comments (skin integrity, edema, etc.): Pt's husband present during session.     Exercises     Assessment/Plan    PT Assessment Patient needs continued PT services  PT Problem List Decreased strength;Decreased mobility;Decreased knowledge of use of DME;Pain       PT Treatment Interventions DME instruction;Gait training;Stair training;Functional mobility training;Therapeutic activities;Therapeutic exercise;Balance training;Patient/family education    PT Goals (Current goals can be found in the Care Plan section)  Acute Rehab PT Goals Patient Stated Goal: to go home today  PT Goal Formulation: With patient Time For Goal Achievement: 07/26/18 Potential to Achieve Goals: Good    Frequency Min 3X/week   Barriers to discharge        Co-evaluation               AM-PAC PT "6 Clicks" Daily Activity  Outcome Measure Difficulty turning over in bed (including adjusting bedclothes, sheets and blankets)?: A Little Difficulty moving from lying on back to sitting on the side of the bed? : A Little Difficulty sitting down on and standing up from a chair with arms (e.g., wheelchair, bedside commode, etc,.)?: Unable Help needed moving to and from a bed to chair (including a wheelchair)?: A Little Help  needed walking in hospital room?: A Little Help needed climbing 3-5 steps with a railing? : A Little 6 Click Score: 16    End of Session Equipment Utilized During Treatment: Gait belt Activity Tolerance: Patient tolerated treatment well Patient left: in bed;with call bell/phone within reach;with family/visitor present Nurse Communication: Mobility status PT Visit Diagnosis: Other abnormalities of gait and mobility (R26.89);Muscle weakness (generalized) (M62.81);History of falling (Z91.81);Pain Pain - part of body: (sacrum )    Time: 3300-7622 PT Time Calculation (min) (ACUTE ONLY): 30 min   Charges:   PT Evaluation $PT Eval Low Complexity: 1 Low PT Treatments $Gait Training: 8-22 mins        Leighton Ruff, PT, DPT  Acute Rehabilitation Services  Pager: [phone] Office: [phone]   Rudean Hitt 07/12/2018, 2:44 PM

## 2018-07-12 NOTE — Care Management Note (Signed)
Case Management Note  Patient Details  Name: Debbie [name] MRN: [id number] Date of Birth: 1946-10-03  Subjective/Objective: 72 yr old female placed in observation status after a fall,  sustaining sacral fracture.                   Action/Plan: Case manager spoke with patient and her husband concerning discharge plan. Choice for Home Health agency offered. Patient says she has used [facility] in the past.CM called referral to [facility]. Patient's husband will provide assistance at discharge, they have walker, 3in1 at home.    Expected Discharge Date:   07/12/18               Expected Discharge Plan:  [place]  In-House Referral:  NA  Discharge planning Services  CM Consult  Post Acute Care Choice:  Home Health Choice offered to:  Patient, Spouse  DME Arranged:  (Has RW, 3in1) DME Agency:  NA  HH Arranged:  PT HH Agency:  [facility]  Status of Service:  Completed, signed off  If discussed at [place] of Stay Meetings, dates discussed:    Additional Comments:  Ninfa Meeker, RN 07/12/2018, 2:44 PM

## 2018-07-12 NOTE — Progress Notes (Signed)
PT Cancellation Note  Patient Details Name: SAMADHI MAHURIN MRN: [id number] DOB: 1946/07/27   Cancelled Treatment:    Reason Eval/Treat Not Completed: Active bedrest order Pt with strict bed rest orders. Will follow up once activity orders updated and as schedule allows.   Leighton Ruff, PT, DPT  Acute Rehabilitation Services  Pager: [phone] Office: [phone]  Rudean Hitt 07/12/2018, 10:27 AM

## 2018-07-12 NOTE — Discharge Summary (Signed)
Physician Discharge Summary  [name]:[id number] DOB: 1946/08/08 DOA: 07/11/2018  PCP: [name], [name], MD  Admit date: 07/11/2018 Discharge date: 07/12/2018  Admitted From: Home Disposition: Home Recommendations for Outpatient Follow-up:  1. Follow up with PCP in 1-2 weeks 2. Follow-up with orthopedics in 4 weeks  Home Health: Yes physical therapy Equipment/Devices: Walker  Discharge Condition: Improved CODE STATUS: Full code Diet recommendation: Heart Healthy   Brief/Interim Summary:  Debbie [name]  is a 72 y.o. female, with history of atrial fibrillation, on chronic anticoagulation with Coumadin, benign essential tremor, migraine, depression with anxiety, hypertension, history of partial seizures came to hospital after patient tripped and fell backwards onto her back while walking with her cane.  Patient admitted hitting her head on the ground and also complained of pain in the tailbone. She denied passing out.  No nausea vomiting or diarrhea.  No chest pain or shortness of breath. Denies numbness or tingling the extremities.  In the ED, x-ray of the sacrum/coccyx showed slightly displaced fracture of the fifth sacral segment visible only on the lateral view  Patient was admitted and started on scheduled Tylenol and PRN oxycodone (2 different doses depending on her degree of pain).  She was concerned because of her intolerance to oxycodone being nausea and vomiting.  To treat that I started her on some Reglan and Zofran.  She has done very well while being monitored in the hospital and was able to get up with a walker and walked to the bathroom.  She was seen by physical therapy who felt that she was stable for discharge with home health physical therapy and a walker.  She was seen by orthopedic surgery who felt that she would not require any surgical intervention but would be weightbearing as tolerated.  At this point is reached maximum benefit of hospitalization.  Discharge  diagnosis, prognosis, plans, follow-up, treatment, and medications were discussed with the patient and her husband.  She is in agreement with the plan as described.  She is stable for discharge home.  Discharge Diagnoses:  Active Problems:   Sacral fracture, closed [facility])    Discharge Instructions  Discharge Instructions    Call MD for:  hives   Complete by:  As directed    Call MD for:  persistant dizziness or light-headedness   Complete by:  As directed    Call MD for:  severe uncontrolled pain   Complete by:  As directed    Diet - low sodium heart healthy   Complete by:  As directed    Discharge instructions   Complete by:  As directed    Slowly increase your activity to normal over the next 4 weeks   Increase activity slowly   Complete by:  As directed    Schedule appointment   Complete by:  As directed    With [place] in 6 weeks     Allergies as of 07/12/2018      Reactions   Diclofenac Shortness Of Breath   Atorvastatin Other (See Comments)   Muscle pain   Tape Other (See Comments)   Peels skin off (bandaids, too) unknown   Mushroom Extract Complex Nausea And Vomiting   Tramadol Other (See Comments), Nausea And Vomiting   Buprenorphine Hcl Nausea And Vomiting   Codeine Nausea And Vomiting   Fish Oil Nausea And Vomiting   Morphine And Related Nausea And Vomiting      Medication List    STOP taking these medications  HYDROcodone-acetaminophen 5-325 MG tablet Commonly known as:  NORCO/VICODIN   promethazine 25 MG tablet Commonly known as:  PHENERGAN     TAKE these medications   acetaminophen 325 MG tablet Commonly known as:  TYLENOL Take 2 tablets (650 mg total) by mouth every 6 (six) hours. What changed:    medication strength  how much to take  when to take this   calcium carbonate 600 MG Tabs tablet Commonly known as:  OS-CAL Take 600 mg by mouth daily with breakfast.   diltiazem 120 MG 24 hr capsule Commonly known as:  CARDIZEM  CD TAKE 1 CAPSULE BY MOUTH ONCE DAILY   diltiazem 30 MG tablet Commonly known as:  CARDIZEM Take 1 tablet as needed for palpitations or heart rate greater than 110   DULoxetine 20 MG capsule Commonly known as:  CYMBALTA Take 2 capsules (40 mg total) by mouth daily.   ergocalciferol 50000 units capsule Commonly known as:  VITAMIN D2 1 capsule every 10 days. What changed:    how much to take  how to take this  when to take this  additional instructions   gabapentin 600 MG tablet Commonly known as:  NEURONTIN Take 1 tablet (600 mg total) by mouth 3 (three) times daily.   lamoTRIgine 25 MG tablet Commonly known as:  LAMICTAL Take 1 tablet (25 mg total) by mouth 2 (two) times daily.   metoCLOPramide 5 MG tablet Commonly known as:  REGLAN Take 1 tablet (5 mg total) by mouth every 6 (six) hours.   ondansetron 8 MG tablet Commonly known as:  ZOFRAN Take 1 tablet (8 mg total) by mouth every 6 (six) hours.   Oxycodone HCl 10 MG Tabs Take 1 tablet (10 mg total) by mouth every 4 (four) hours as needed for up to 5 days for severe pain.   oxyCODONE 5 MG immediate release tablet Commonly known as:  Oxy IR/ROXICODONE Take 1 tablet (5 mg total) by mouth every 4 (four) hours as needed for up to 5 days for moderate pain.   vitamin B-12 1000 MCG tablet Commonly known as:  CYANOCOBALAMIN Take 1,000 mcg by mouth daily.   warfarin 5 MG tablet Commonly known as:  COUMADIN Take as directed. If you are unsure how to take this medication, talk to your nurse or doctor. Original instructions:  TAKE 1 TABLET BY MOUTH ONCE DAILY AS DIRECTED What changed:    how much to take  when to take this  additional instructions      Follow-up Information    [facility] Follow [facility].   Specialty:  [place] Why:  A representative from [place] will contact you to arrange start date and time for your therapy. Contact information: [place] [address] [phone]        [name], [name], MD. Schedule an appointment as soon as possible for a visit in 1 week(s).   Specialty:  Family Medicine Contact information: [address] [place] [address] [phone]        Nelva Bush, MD .   Specialty:  Cardiology Contact information: [address] [place] [address] [phone]        Melrose Nakayama, MD. Schedule an appointment as soon as possible for a visit in 2 week(s).   Specialty:  Orthopedic Surgery Contact information: [place] [address] [phone]          Allergies  Allergen Reactions  .  Diclofenac Shortness Of Breath  . Atorvastatin Other (See Comments)    Muscle pain  . Tape Other (See Comments)    Peels skin off (bandaids, too) unknown  . Mushroom Extract Complex Nausea And Vomiting  . Tramadol Other (See Comments) and Nausea And Vomiting  . Buprenorphine Hcl Nausea And Vomiting  . Codeine Nausea And Vomiting  . Fish Oil Nausea And Vomiting  . Morphine And Related Nausea And Vomiting    Consultations:  Dr. Hilbert Odor from [facility] orthopedics   Procedures/Studies: Dg Chest 2 View  Result Date: 06/15/2018 CLINICAL DATA:  Chest pain.  Cough.  History of pneumonia. EXAM: CHEST - 2 VIEW COMPARISON:  08/15/2017. FINDINGS: Stable borderline enlarged cardiac silhouette. Stable linear scarring at the left lung base. Stable mild diffuse peribronchial thickening and accentuation of the interstitial markings. Thoracic spine degenerative changes. IMPRESSION: 1. No acute abnormality. 2. Stable mild chronic bronchitic changes. 3. Stable linear scarring at the left lung base. Electronically Signed   By: Claudie Revering M.D.   On: 06/15/2018 21:58   Dg Sacrum/coccyx  Result Date: 07/11/2018 CLINICAL DATA:  Sacral pain secondary to a fall today. EXAM: SACRUM AND COCCYX - 2+ VIEW COMPARISON:  CT scan of the abdomen and pelvis dated  12/23/2015 FINDINGS: There appears to be a transverse fracture through the fifth sacral segment seen only on the lateral view. The remainder of the sacrum and coccyx are intact. IMPRESSION: Slightly displaced fracture of the fifth sacral segment visible only on the lateral view. Electronically Signed   By: Lorriane Shire M.D.   On: 07/11/2018 13:54   Ct Head Wo Contrast  Result Date: 07/11/2018 CLINICAL DATA:  72 year old female status post trip and fall, striking head. On blood thinners. EXAM: CT HEAD WITHOUT CONTRAST CT CERVICAL SPINE WITHOUT CONTRAST TECHNIQUE: Multidetector CT imaging of the head and cervical spine was performed following the standard protocol without intravenous contrast. Multiplanar CT image reconstructions of the cervical spine were also generated. COMPARISON:  None. FINDINGS: CT HEAD FINDINGS Brain: Small area of chronic encephalomalacia in the right superior parietal lobe (sagittal image 22). Elsewhere Gray-white matter differentiation is within normal limits throughout the brain. Cerebral volume is within normal limits for age. No midline shift, ventriculomegaly, mass effect, evidence of mass lesion, intracranial hemorrhage or evidence of cortically based acute infarction. Vascular: Calcified atherosclerosis at the skull base. Skull: Generalized calvarium hyperostosis. Sequelae of right parietal convexity craniotomy. No acute osseous abnormality identified. Sinuses/Orbits: Paranasal sinuses and mastoids are well pneumatized. Other: No scalp hematoma. Visualized orbit soft tissues are within normal limits. CT CERVICAL SPINE FINDINGS Alignment: Preserved cervical lordosis. Cervicothoracic junction alignment is within normal limits. Bilateral posterior element alignment is within normal limits. Skull base and vertebrae: Visualized skull base is intact. No atlanto-occipital dissociation. Cervical vertebrae appear intact. Soft tissues and spinal canal: No prevertebral fluid or swelling. No  visible canal hematoma. Negative noncontrast neck soft tissues. Disc levels: Lower cervical disc space loss and endplate spurring. Intermittent cervical facet hypertrophy. Up to mild lower cervical spinal stenosis at C6-C7. Upper chest: Visible upper thoracic levels appear grossly intact. Benign vertebral body hemangioma in T2 on the left. Negative lung apices and noncontrast thoracic inlet. IMPRESSION: 1. No acute traumatic injury identified. In the head or cervical spine. 2. Previous right parietal craniotomy with mild underlying encephalomalacia. 3. 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 Electronically Signed   By: Genevie Ann M.D.   On: 07/11/2018 13:59   Ct Cervical  Spine Wo Contrast  Result Date: 07/11/2018 CLINICAL DATA:  72 year old female status post trip and fall, striking head. On blood thinners. EXAM: CT HEAD WITHOUT CONTRAST CT CERVICAL SPINE WITHOUT CONTRAST TECHNIQUE: Multidetector CT imaging of the head and cervical spine was performed following the standard protocol without intravenous contrast. Multiplanar CT image reconstructions of the cervical spine were also generated. COMPARISON:  None. FINDINGS: CT HEAD FINDINGS Brain: Small area of chronic encephalomalacia in the right superior parietal lobe (sagittal image 22). Elsewhere Gray-white matter differentiation is within normal limits throughout the brain. Cerebral volume is within normal limits for age. No midline shift, ventriculomegaly, mass effect, evidence of mass lesion, intracranial hemorrhage or evidence of cortically based acute infarction. Vascular: Calcified atherosclerosis at the skull base. Skull: Generalized calvarium hyperostosis. Sequelae of right parietal convexity craniotomy. No acute osseous abnormality identified. Sinuses/Orbits: Paranasal sinuses and mastoids are well pneumatized. Other: No scalp hematoma. Visualized orbit soft tissues are within normal limits. CT CERVICAL SPINE FINDINGS  Alignment: Preserved cervical lordosis. Cervicothoracic junction alignment is within normal limits. Bilateral posterior element alignment is within normal limits. Skull base and vertebrae: Visualized skull base is intact. No atlanto-occipital dissociation. Cervical vertebrae appear intact. Soft tissues and spinal canal: No prevertebral fluid or swelling. No visible canal hematoma. Negative noncontrast neck soft tissues. Disc levels: Lower cervical disc space loss and endplate spurring. Intermittent cervical facet hypertrophy. Up to mild lower cervical spinal stenosis at C6-C7. Upper chest: Visible upper thoracic levels appear grossly intact. Benign vertebral body hemangioma in T2 on the left. Negative lung apices and noncontrast thoracic inlet. IMPRESSION: 1. No acute traumatic injury identified. In the head or cervical spine. 2. Previous right parietal craniotomy with mild underlying encephalomalacia. 3. 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 Electronically Signed   By: Genevie Ann M.D.   On: 07/11/2018 13:59   Dg Hips Bilat W Or Wo Pelvis 3-4 Views  Result Date: 07/11/2018 CLINICAL DATA:  Hip pain and sacrum pain secondary to a fall today. EXAM: DG HIP (WITH OR WITHOUT PELVIS) 3-4V BILAT COMPARISON:  None. FINDINGS: There is no evidence of hip fracture or dislocation. There is no evidence of arthropathy or other focal bone abnormality. IMPRESSION: Negative. Electronically Signed   By: Lorriane Shire M.D.   On: 07/11/2018 13:52     Subjective:   Discharge Exam: Vitals:   07/12/18 0416 07/12/18 1307  BP: (!) 119/52 113/61  Pulse: (!) 58 (!) 59  Resp: 15   Temp: 98.1 F (36.7 C) 98 F (36.7 C)  SpO2: 98% 97%   Vitals:   07/11/18 1929 07/11/18 2007 07/12/18 0416 07/12/18 1307  BP: 126/61 138/68 (!) 119/52 113/61  Pulse: (!) 56 (!) 57 (!) 58 (!) 59  Resp:  15 15   Temp:  98.2 F (36.8 C) 98.1 F (36.7 C) 98 F (36.7 C)  TempSrc:  Oral Oral Oral  SpO2:  93% 91% 98% 97%    General: Pt is alert, awake, not in acute distress Cardiovascular: RRR, S1/S2 +, no rubs, no gallops Respiratory: CTA bilaterally, no wheezing, no rhonchi Abdominal: Soft, NT, ND, bowel sounds + Extremities: no edema, no cyanosis    The results of significant diagnostics from this hospitalization (including imaging, microbiology, ancillary and laboratory) are listed below for reference.     Microbiology: No results found for this or any previous visit (from the past 240 hour(s)).   Labs: BNP (last 3 results) No results for input(s): BNP in the last  8760 hours. Basic Metabolic Panel: Recent Labs  Lab 07/11/18 1300 07/12/18 0447  NA 140 140  K 4.0 3.9  CL 105 105  CO2 26 27  GLUCOSE 98 98  BUN 8 9  CREATININE 0.82 0.80  CALCIUM 9.2 8.8*   Liver Function Tests: Recent Labs  Lab 07/11/18 1300 07/12/18 0447  AST 38 30  ALT 21 19  ALKPHOS 57 49  BILITOT 0.9 1.2  PROT 7.1 6.7  ALBUMIN 3.7 3.4*   No results for input(s): LIPASE, AMYLASE in the last 168 hours. No results for input(s): AMMONIA in the last 168 hours. CBC: Recent Labs  Lab 07/11/18 1300 07/12/18 0447  WBC 4.6 4.2  NEUTROABS 2.9  --   HGB 13.5 13.0  HCT 44.0 41.3  MCV 96.9 96.3  PLT 147* 143*   Cardiac Enzymes: No results for input(s): CKTOTAL, CKMB, CKMBINDEX, TROPONINI in the last 168 hours. BNP: Invalid input(s): POCBNP CBG: No results for input(s): GLUCAP in the last 168 hours. D-Dimer No results for input(s): DDIMER in the last 72 hours. Hgb A1c No results for input(s): HGBA1C in the last 72 hours. Lipid Profile No results for input(s): CHOL, HDL, LDLCALC, TRIG, CHOLHDL, LDLDIRECT in the last 72 hours. Thyroid function studies No results for input(s): TSH, T4TOTAL, T3FREE, THYROIDAB in the last 72 hours.  Invalid input(s): FREET3 Anemia work up No results for input(s): VITAMINB12, FOLATE, FERRITIN, TIBC, IRON, RETICCTPCT in the last 72 hours. Urinalysis     Component Value Date/Time   COLORURINE YELLOW 12/23/2015 0058   APPEARANCEUR CLEAR 12/23/2015 0058   LABSPEC 1.011 12/23/2015 0058   PHURINE 7.5 12/23/2015 0058   GLUCOSEU NEGATIVE 12/23/2015 0058   HGBUR NEGATIVE 12/23/2015 0058   BILIRUBINUR NEGATIVE 12/23/2015 0058   KETONESUR NEGATIVE 12/23/2015 0058   PROTEINUR NEGATIVE 12/23/2015 0058   NITRITE NEGATIVE 12/23/2015 0058   LEUKOCYTESUR NEGATIVE 12/23/2015 0058   Sepsis Labs Invalid input(s): PROCALCITONIN,  WBC,  LACTICIDVEN Microbiology No results found for this or any previous visit (from the past 240 hour(s)).   Time coordinating discharge:  42 minutes  SIGNED:   Lady Deutscher, MD  [facility] 07/12/2018, 3:39 PM Pager   If 7PM-7AM, please contact night-coverage [email or web address] Password [email or web address]

## 2018-07-12 NOTE — Consult Note (Signed)
Reason for Consult:Sacral fx Referring Physician: T Breely Panik is an 72 y.o. female.  HPI: Debbie [name] was walking down her hall with her 4-point cane when it got tangled in her feet and she fell sideways into the bathroom and landed on her back and hit her head. She was brought to the hospital where x-rays showed a sacral fx and orthopedic surgery was consulted. She c/o back pain in that area and some head pain where she hit it.  Past Medical History:  Diagnosis Date  . Atrial fibrillation ([place])   . Benign essential tremor   . Breast cancer ([place])   . Chronic renal insufficiency   . Common migraine 05/07/2015  . Depression with anxiety   . Drowsiness    Excessive daytime  . Dyslipidemia   . Fibromyalgia   . History of partial seizures   . Hypertension   . Memory disturbance   . Muscle tension headache   . Obesity   . Obstructive sleep apnea   . S/P epidural steroid injection   . Seizures ([place])    Partial  . Sprain of neck 06/26/2013  . Tremors of nervous system   . Vitamin B12 deficiency     Past Surgical History:  Procedure Laterality Date  . ABDOMINAL HYSTERECTOMY    . ablation procedure     Cardiac, for Afib  . BASAL CELL CARCINOMA EXCISION    . BASAL CELL CARCINOMA EXCISION    . BRAIN MENINGIOMA EXCISION Right   . BREAST RECONSTRUCTION Bilateral   . breast reconstructive    . CHOLECYSTECTOMY    . DILATION AND CURETTAGE OF UTERUS    . dilution    . ESOPHAGEAL MANOMETRY N/A 12/14/2016   Procedure: ESOPHAGEAL MANOMETRY (EM);  Surgeon: Mauri Pole, MD;  Location: [facility];  Service: Endoscopy;  Laterality: N/A;  . gallbladder resection    . LUMBAR SPINE SURGERY N/A   . lumbosacral    . mastectomies Bilateral   . NOSE SURGERY     Basal cell carcinoma resection from the nose  . TUBAL LIGATION N/A     Family History  Problem Relation Age of Onset  . Heart failure Mother   . Diabetes Mother   . Arthritis Mother   . Hyperlipidemia Mother   .  Heart disease Mother   . Stroke Mother   . Hypertension Mother   . Cancer - Colon Father   . Colon cancer Father        late 89's  . Arthritis Father   . Cancer Father   . Breast cancer Sister   . Diabetes Maternal Grandfather   . Heart failure Maternal Grandfather   . Stroke Maternal Grandmother   . Lung disease Neg Hx     Social History:  reports that she has never smoked. She has never used smokeless tobacco. She reports that she does not drink alcohol or use drugs.  Allergies:  Allergies  Allergen Reactions  . Diclofenac Shortness Of Breath  . Atorvastatin Other (See Comments)    Muscle pain  . Tape Other (See Comments)    Peels skin off (bandaids, too) unknown  . Mushroom Extract Complex Nausea And Vomiting  . Tramadol Other (See Comments) and Nausea And Vomiting  . Buprenorphine Hcl Nausea And Vomiting  . Codeine Nausea And Vomiting  . Fish Oil Nausea And Vomiting  . Morphine And Related Nausea And Vomiting    Medications: I have reviewed the patient's current medications.  Results  for orders placed or performed during the hospital encounter of 07/11/18 (from the past 48 hour(s))  Protime-INR     Status: Abnormal   Collection Time: 07/11/18  1:00 PM  Result Value Ref Range   Prothrombin Time 24.0 (H) 11.4 - 15.2 seconds   INR 2.18     Comment: Performed at [facility] [address], [place], [place] [address]  CBC with Differential     Status: Abnormal   Collection Time: 07/11/18  1:00 PM  Result Value Ref Range   WBC 4.6 4.0 - 10.5 K/uL   RBC 4.54 3.87 - 5.11 MIL/uL   Hemoglobin 13.5 12.0 - 15.0 g/dL   HCT 44.0 36.0 - 46.0 %   MCV 96.9 78.0 - 100.0 fL   MCH 29.7 26.0 - 34.0 pg   MCHC 30.7 30.0 - 36.0 g/dL   RDW 13.5 11.5 - 15.5 %   Platelets 147 (L) 150 - 400 K/uL   Neutrophils Relative % 62 %   Neutro Abs 2.9 1.7 - 7.7 K/uL   Lymphocytes Relative 27 %   Lymphs Abs 1.2 0.7 - 4.0 K/uL   Monocytes Relative 8 %   Monocytes Absolute 0.4 0.1 -  1.0 K/uL   Eosinophils Relative 2 %   Eosinophils Absolute 0.1 0.0 - 0.7 K/uL   Basophils Relative 1 %   Basophils Absolute 0.0 0.0 - 0.1 K/uL   Immature Granulocytes 0 %   Abs Immature Granulocytes 0.0 0.0 - 0.1 K/uL    Comment: Performed at [facility] [address], [place], [place] [address]  Comprehensive metabolic panel     Status: None   Collection Time: 07/11/18  1:00 PM  Result Value Ref Range   Sodium 140 135 - 145 mmol/L   Potassium 4.0 3.5 - 5.1 mmol/L   Chloride 105 98 - 111 mmol/L   CO2 26 22 - 32 mmol/L   Glucose, Bld 98 70 - 99 mg/dL   BUN 8 8 - 23 mg/dL   Creatinine, Ser 0.82 0.44 - 1.00 mg/dL   Calcium 9.2 8.9 - 10.3 mg/dL   Total Protein 7.1 6.5 - 8.1 g/dL   Albumin 3.7 3.5 - 5.0 g/dL   AST 38 15 - 41 U/L   ALT 21 0 - 44 U/L   Alkaline Phosphatase 57 38 - 126 U/L   Total Bilirubin 0.9 0.3 - 1.2 mg/dL   GFR calc non Af Amer >60 >60 mL/min   GFR calc Af Amer >60 >60 mL/min    Comment: (NOTE) The eGFR has been calculated using the CKD EPI equation. This calculation has not been validated in all clinical situations. eGFR's persistently <60 mL/min signify possible Chronic Kidney Disease.    Anion gap 9 5 - 15    Comment: Performed at [facility] [address], [place], [place] [address]  Protime-INR     Status: Abnormal   Collection Time: 07/12/18  4:47 AM  Result Value Ref Range   Prothrombin Time 22.5 (H) 11.4 - 15.2 seconds   INR 1.99     Comment: Performed at [facility] [address], [place] [address]  CBC     Status: Abnormal   Collection Time: 07/12/18  4:47 AM  Result Value Ref Range   WBC 4.2 4.0 - 10.5 K/uL   RBC 4.29 3.87 - 5.11 MIL/uL   Hemoglobin 13.0 12.0 - 15.0 g/dL   HCT 41.3 36.0 - 46.0 %  MCV 96.3 78.0 - 100.0 fL   MCH 30.3 26.0 - 34.0 pg   MCHC 31.5 30.0 - 36.0 g/dL   RDW 13.7 11.5 - 15.5 %   Platelets 143 (L) 150 - 400 K/uL    Comment: Performed at [facility], [place] [address],  [place], [place] [address]  Comprehensive metabolic panel     Status: Abnormal   Collection Time: 07/12/18  4:47 AM  Result Value Ref Range   Sodium 140 135 - 145 mmol/L   Potassium 3.9 3.5 - 5.1 mmol/L   Chloride 105 98 - 111 mmol/L   CO2 27 22 - 32 mmol/L   Glucose, Bld 98 70 - 99 mg/dL   BUN 9 8 - 23 mg/dL   Creatinine, Ser 0.80 0.44 - 1.00 mg/dL   Calcium 8.8 (L) 8.9 - 10.3 mg/dL   Total Protein 6.7 6.5 - 8.1 g/dL   Albumin 3.4 (L) 3.5 - 5.0 g/dL   AST 30 15 - 41 U/L   ALT 19 0 - 44 U/L   Alkaline Phosphatase 49 38 - 126 U/L   Total Bilirubin 1.2 0.3 - 1.2 mg/dL   GFR calc non Af Amer >60 >60 mL/min   GFR calc Af Amer >60 >60 mL/min    Comment: (NOTE) The eGFR has been calculated using the CKD EPI equation. This calculation has not been validated in all clinical situations. eGFR's persistently <60 mL/min signify possible Chronic Kidney Disease.    Anion gap 8 5 - 15    Comment: Performed at [facility] [address], [place], [place] [address]    Dg Sacrum/coccyx  Result Date: 07/11/2018 CLINICAL DATA:  Sacral pain secondary to a fall today. EXAM: SACRUM AND COCCYX - 2+ VIEW COMPARISON:  CT scan of the abdomen and pelvis dated 12/23/2015 FINDINGS: There appears to be a transverse fracture through the fifth sacral segment seen only on the lateral view. The remainder of the sacrum and coccyx are intact. IMPRESSION: Slightly displaced fracture of the fifth sacral segment visible only on the lateral view. Electronically Signed   By: Lorriane Shire M.D.   On: 07/11/2018 13:54   Ct Head Wo Contrast  Result Date: 07/11/2018 CLINICAL DATA:  72 year old female status post trip and fall, striking head. On blood thinners. EXAM: CT HEAD WITHOUT CONTRAST CT CERVICAL SPINE WITHOUT CONTRAST TECHNIQUE: Multidetector CT imaging of the head and cervical spine was performed following the standard protocol without intravenous contrast. Multiplanar CT image reconstructions of the cervical spine  were also generated. COMPARISON:  None. FINDINGS: CT HEAD FINDINGS Brain: Small area of chronic encephalomalacia in the right superior parietal lobe (sagittal image 22). Elsewhere Gray-white matter differentiation is within normal limits throughout the brain. Cerebral volume is within normal limits for age. No midline shift, ventriculomegaly, mass effect, evidence of mass lesion, intracranial hemorrhage or evidence of cortically based acute infarction. Vascular: Calcified atherosclerosis at the skull base. Skull: Generalized calvarium hyperostosis. Sequelae of right parietal convexity craniotomy. No acute osseous abnormality identified. Sinuses/Orbits: Paranasal sinuses and mastoids are well pneumatized. Other: No scalp hematoma. Visualized orbit soft tissues are within normal limits. CT CERVICAL SPINE FINDINGS Alignment: Preserved cervical lordosis. Cervicothoracic junction alignment is within normal limits. Bilateral posterior element alignment is within normal limits. Skull base and vertebrae: Visualized skull base is intact. No atlanto-occipital dissociation. Cervical vertebrae appear intact. Soft tissues and spinal canal: No prevertebral fluid or swelling. No visible canal hematoma. Negative noncontrast neck soft tissues. Disc levels:  Lower cervical disc space loss and endplate spurring. Intermittent cervical facet hypertrophy. Up to mild lower cervical spinal stenosis at C6-C7. Upper chest: Visible upper thoracic levels appear grossly intact. Benign vertebral body hemangioma in T2 on the left. Negative lung apices and noncontrast thoracic inlet. IMPRESSION: 1. No acute traumatic injury identified. In the head or cervical spine. 2. Previous right parietal craniotomy with mild underlying encephalomalacia. 3. 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 Electronically Signed   By: Genevie Ann M.D.   On: 07/11/2018 13:59   Ct Cervical Spine Wo Contrast  Result Date:  07/11/2018 CLINICAL DATA:  72 year old female status post trip and fall, striking head. On blood thinners. EXAM: CT HEAD WITHOUT CONTRAST CT CERVICAL SPINE WITHOUT CONTRAST TECHNIQUE: Multidetector CT imaging of the head and cervical spine was performed following the standard protocol without intravenous contrast. Multiplanar CT image reconstructions of the cervical spine were also generated. COMPARISON:  None. FINDINGS: CT HEAD FINDINGS Brain: Small area of chronic encephalomalacia in the right superior parietal lobe (sagittal image 22). Elsewhere Gray-white matter differentiation is within normal limits throughout the brain. Cerebral volume is within normal limits for age. No midline shift, ventriculomegaly, mass effect, evidence of mass lesion, intracranial hemorrhage or evidence of cortically based acute infarction. Vascular: Calcified atherosclerosis at the skull base. Skull: Generalized calvarium hyperostosis. Sequelae of right parietal convexity craniotomy. No acute osseous abnormality identified. Sinuses/Orbits: Paranasal sinuses and mastoids are well pneumatized. Other: No scalp hematoma. Visualized orbit soft tissues are within normal limits. CT CERVICAL SPINE FINDINGS Alignment: Preserved cervical lordosis. Cervicothoracic junction alignment is within normal limits. Bilateral posterior element alignment is within normal limits. Skull base and vertebrae: Visualized skull base is intact. No atlanto-occipital dissociation. Cervical vertebrae appear intact. Soft tissues and spinal canal: No prevertebral fluid or swelling. No visible canal hematoma. Negative noncontrast neck soft tissues. Disc levels: Lower cervical disc space loss and endplate spurring. Intermittent cervical facet hypertrophy. Up to mild lower cervical spinal stenosis at C6-C7. Upper chest: Visible upper thoracic levels appear grossly intact. Benign vertebral body hemangioma in T2 on the left. Negative lung apices and noncontrast thoracic  inlet. IMPRESSION: 1. No acute traumatic injury identified. In the head or cervical spine. 2. Previous right parietal craniotomy with mild underlying encephalomalacia. 3. 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 Electronically Signed   By: Genevie Ann M.D.   On: 07/11/2018 13:59   Dg Hips Bilat W Or Wo Pelvis 3-4 Views  Result Date: 07/11/2018 CLINICAL DATA:  Hip pain and sacrum pain secondary to a fall today. EXAM: DG HIP (WITH OR WITHOUT PELVIS) 3-4V BILAT COMPARISON:  None. FINDINGS: There is no evidence of hip fracture or dislocation. There is no evidence of arthropathy or other focal bone abnormality. IMPRESSION: Negative. Electronically Signed   By: Lorriane Shire M.D.   On: 07/11/2018 13:52    Review of Systems  Constitutional: Negative for weight loss.  HENT: Negative for ear discharge, ear pain, hearing loss and tinnitus.   Eyes: Negative for blurred vision, double vision, photophobia and pain.  Respiratory: Negative for cough, sputum production and shortness of breath.   Cardiovascular: Negative for chest pain.  Gastrointestinal: Negative for abdominal pain, nausea and vomiting.  Genitourinary: Negative for dysuria, flank pain, frequency and urgency.  Musculoskeletal: Positive for back pain and joint pain (Right hip). Negative for falls, myalgias and neck pain.  Neurological: Positive for headaches. Negative for dizziness, tingling, sensory change, focal weakness and  loss of consciousness.  Endo/Heme/Allergies: Does not bruise/bleed easily.  Psychiatric/Behavioral: Negative for depression, memory loss and substance abuse. The patient is not nervous/anxious.    Blood pressure (!) 119/52, pulse (!) 58, temperature 98.1 F (36.7 C), temperature source Oral, resp. rate 15, SpO2 98 %. Physical Exam  Constitutional: She appears well-developed and well-nourished. No distress.  HENT:  Head: Normocephalic and atraumatic.  Eyes: Conjunctivae are normal.  Right eye exhibits no discharge. Left eye exhibits no discharge. No scleral icterus.  Neck: Normal range of motion.  Cardiovascular: Normal rate and regular rhythm.  Respiratory: Effort normal. No respiratory distress.  Musculoskeletal:  Pelvis--no traumatic wounds or rash, no ecchymosis, stable to manual stress, referred pain to sacrum with AP compression  BLE No traumatic wounds, ecchymosis, or rash  TTP right hip  No knee or ankle effusion  Knee stable to varus/ valgus and anterior/posterior stress  Sens DPN, SPN, TN intact  Motor EHL, ext, flex, evers 5/5  DP 2+, PT 2+, No significant edema  Neurological: She is alert.  Skin: Skin is warm and dry. She is not diaphoretic.  Psychiatric: She has a normal mood and affect. Her behavior is normal.    Assessment/Plan: Sacral fx -- May be WBAT. I recommended purchase of a wedge pillow for comfort as this heals. She may continue to f/u with her primary orthopedic surgeon Dr. Rhona Raider but may see Dr. Percell Miller as needed.    Lisette Abu, PA-C Orthopedic Surgery [phone] 07/12/2018, 10:38 AM

## 2018-07-13 ENCOUNTER — Telehealth: Payer: Self-pay | Admitting: Family Medicine

## 2018-07-13 NOTE — Telephone Encounter (Signed)
Transition Care Management Follow-up Telephone Call  Debbie [name] TIR:[id number] DOB: 10/06/1946 DOA: 07/11/2018  PCP: [name], [name], MD  Admit date: 07/11/2018 Discharge date: 07/12/2018  Admitted From: Home Disposition: Home Recommendations for Outpatient Follow-up:  1. Follow up with PCP in 1-2 weeks 2. Follow-up with orthopedics in 4 weeks  Home Health: Yes physical therapy Equipment/Devices: Walker  Discharge Condition: Improved CODE STATUS: Full code Diet recommendation: Heart Healthy   Brief/Interim Summary: [name] a85 y.o.female,with history of atrial fibrillation, on chronic anticoagulation with Coumadin, benign essential tremor, migraine, depression with anxiety, hypertension, history of partial seizures came to hospital after patient tripped and fell backwards onto her back while walking with her cane. Patient admitted hitting her head on the ground and also complained of pain in the tailbone. She denied passing out. No nausea vomiting or diarrhea. No chest pain or shortness of breath. Denies numbness or tingling the extremities.  In the ED,x-ray of the sacrum/coccyx showed slightly displaced fracture of the fifth sacral segment visible only on the lateral view   How have you been since you were released from the hospital? "doing okay"   Do you understand why you were in the hospital? yes   Do you understand the discharge instructions? yes   Where were you discharged to? Home   Items Reviewed:  Medications reviewed: yes  Allergies reviewed: yes  Dietary changes reviewed: yes  Referrals reviewed: yes   Functional Questionnaire:   Activities of Daily Living (ADLs):   She states they are independent in the following: feeding, continence and toileting States they require assistance with the following: ambulation, bathing and hygiene, grooming and dressing   Any transportation issues/concerns?: no   Any patient concerns?  no   Confirmed importance and date/time of follow-up visits scheduled yes  Provider Appointment booked with declined at this time, has upcoming appointments, will call office back to schedule with Dr. [name].   Confirmed with patient if condition begins to worsen call PCP or go to the ER.  Patient was given the office number and encouraged to call back with question or concerns.  : yes

## 2018-07-14 DIAGNOSIS — R2689 Other abnormalities of gait and mobility: Secondary | ICD-10-CM | POA: Diagnosis not present

## 2018-07-14 DIAGNOSIS — W01198D Fall on same level from slipping, tripping and stumbling with subsequent striking against other object, subsequent encounter: Secondary | ICD-10-CM | POA: Diagnosis not present

## 2018-07-14 DIAGNOSIS — Z9013 Acquired absence of bilateral breasts and nipples: Secondary | ICD-10-CM | POA: Diagnosis not present

## 2018-07-14 DIAGNOSIS — S3219XD Other fracture of sacrum, subsequent encounter for fracture with routine healing: Secondary | ICD-10-CM | POA: Diagnosis not present

## 2018-07-14 DIAGNOSIS — G43009 Migraine without aura, not intractable, without status migrainosus: Secondary | ICD-10-CM | POA: Diagnosis not present

## 2018-07-14 DIAGNOSIS — Z853 Personal history of malignant neoplasm of breast: Secondary | ICD-10-CM | POA: Diagnosis not present

## 2018-07-14 DIAGNOSIS — I4891 Unspecified atrial fibrillation: Secondary | ICD-10-CM | POA: Diagnosis not present

## 2018-07-14 DIAGNOSIS — G250 Essential tremor: Secondary | ICD-10-CM | POA: Diagnosis not present

## 2018-07-14 DIAGNOSIS — G4089 Other seizures: Secondary | ICD-10-CM | POA: Diagnosis not present

## 2018-07-14 DIAGNOSIS — F418 Other specified anxiety disorders: Secondary | ICD-10-CM | POA: Diagnosis not present

## 2018-07-14 DIAGNOSIS — Z85828 Personal history of other malignant neoplasm of skin: Secondary | ICD-10-CM | POA: Diagnosis not present

## 2018-07-14 DIAGNOSIS — Z9181 History of falling: Secondary | ICD-10-CM | POA: Diagnosis not present

## 2018-07-14 DIAGNOSIS — Z79899 Other long term (current) drug therapy: Secondary | ICD-10-CM | POA: Diagnosis not present

## 2018-07-14 DIAGNOSIS — Z7901 Long term (current) use of anticoagulants: Secondary | ICD-10-CM | POA: Diagnosis not present

## 2018-07-14 DIAGNOSIS — N189 Chronic kidney disease, unspecified: Secondary | ICD-10-CM | POA: Diagnosis not present

## 2018-07-14 DIAGNOSIS — I129 Hypertensive chronic kidney disease with stage 1 through stage 4 chronic kidney disease, or unspecified chronic kidney disease: Secondary | ICD-10-CM | POA: Diagnosis not present

## 2018-07-14 DIAGNOSIS — Z6833 Body mass index (BMI) 33.0-33.9, adult: Secondary | ICD-10-CM | POA: Diagnosis not present

## 2018-07-14 DIAGNOSIS — M797 Fibromyalgia: Secondary | ICD-10-CM | POA: Diagnosis not present

## 2018-07-14 DIAGNOSIS — G4733 Obstructive sleep apnea (adult) (pediatric): Secondary | ICD-10-CM | POA: Diagnosis not present

## 2018-07-14 DIAGNOSIS — E669 Obesity, unspecified: Secondary | ICD-10-CM | POA: Diagnosis not present

## 2018-07-16 ENCOUNTER — Telehealth: Payer: Self-pay

## 2018-07-16 NOTE — Telephone Encounter (Signed)
Left vm with verbal orders for PT for Amy at [facility].

## 2018-07-16 NOTE — Telephone Encounter (Signed)
Copied from [facility] [id number]. Topic: Quick Communication - See Telephone Encounter >> Jul 16, 2018  9:14 AM Antonieta Iba C wrote: CRM for notification. See Telephone encounter for: 07/16/18.    Frequency  1 week 1 and 2 week 3   Requesting vo for PT  - Amy - [phone] W/ Advance- Okay to lvm

## 2018-07-17 ENCOUNTER — Ambulatory Visit: Payer: Medicare Other | Admitting: Nurse Practitioner

## 2018-07-17 DIAGNOSIS — S3219XD Other fracture of sacrum, subsequent encounter for fracture with routine healing: Secondary | ICD-10-CM | POA: Diagnosis not present

## 2018-07-17 DIAGNOSIS — G4089 Other seizures: Secondary | ICD-10-CM | POA: Diagnosis not present

## 2018-07-17 DIAGNOSIS — G250 Essential tremor: Secondary | ICD-10-CM | POA: Diagnosis not present

## 2018-07-17 DIAGNOSIS — F418 Other specified anxiety disorders: Secondary | ICD-10-CM | POA: Diagnosis not present

## 2018-07-17 DIAGNOSIS — I4891 Unspecified atrial fibrillation: Secondary | ICD-10-CM | POA: Diagnosis not present

## 2018-07-17 DIAGNOSIS — R2689 Other abnormalities of gait and mobility: Secondary | ICD-10-CM | POA: Diagnosis not present

## 2018-07-19 DIAGNOSIS — R2689 Other abnormalities of gait and mobility: Secondary | ICD-10-CM | POA: Diagnosis not present

## 2018-07-19 DIAGNOSIS — F418 Other specified anxiety disorders: Secondary | ICD-10-CM | POA: Diagnosis not present

## 2018-07-19 DIAGNOSIS — G250 Essential tremor: Secondary | ICD-10-CM | POA: Diagnosis not present

## 2018-07-19 DIAGNOSIS — G4089 Other seizures: Secondary | ICD-10-CM | POA: Diagnosis not present

## 2018-07-19 DIAGNOSIS — S3219XD Other fracture of sacrum, subsequent encounter for fracture with routine healing: Secondary | ICD-10-CM | POA: Diagnosis not present

## 2018-07-19 DIAGNOSIS — I4891 Unspecified atrial fibrillation: Secondary | ICD-10-CM | POA: Diagnosis not present

## 2018-07-23 DIAGNOSIS — F418 Other specified anxiety disorders: Secondary | ICD-10-CM | POA: Diagnosis not present

## 2018-07-23 DIAGNOSIS — S3219XD Other fracture of sacrum, subsequent encounter for fracture with routine healing: Secondary | ICD-10-CM | POA: Diagnosis not present

## 2018-07-23 DIAGNOSIS — R2689 Other abnormalities of gait and mobility: Secondary | ICD-10-CM | POA: Diagnosis not present

## 2018-07-23 DIAGNOSIS — G4089 Other seizures: Secondary | ICD-10-CM | POA: Diagnosis not present

## 2018-07-23 DIAGNOSIS — I4891 Unspecified atrial fibrillation: Secondary | ICD-10-CM | POA: Diagnosis not present

## 2018-07-23 DIAGNOSIS — G250 Essential tremor: Secondary | ICD-10-CM | POA: Diagnosis not present

## 2018-07-26 DIAGNOSIS — S3219XD Other fracture of sacrum, subsequent encounter for fracture with routine healing: Secondary | ICD-10-CM | POA: Diagnosis not present

## 2018-07-26 DIAGNOSIS — G4089 Other seizures: Secondary | ICD-10-CM | POA: Diagnosis not present

## 2018-07-26 DIAGNOSIS — R2689 Other abnormalities of gait and mobility: Secondary | ICD-10-CM | POA: Diagnosis not present

## 2018-07-26 DIAGNOSIS — I4891 Unspecified atrial fibrillation: Secondary | ICD-10-CM | POA: Diagnosis not present

## 2018-07-26 DIAGNOSIS — F418 Other specified anxiety disorders: Secondary | ICD-10-CM | POA: Diagnosis not present

## 2018-07-26 DIAGNOSIS — G250 Essential tremor: Secondary | ICD-10-CM | POA: Diagnosis not present

## 2018-07-31 DIAGNOSIS — H25013 Cortical age-related cataract, bilateral: Secondary | ICD-10-CM | POA: Diagnosis not present

## 2018-07-31 DIAGNOSIS — H35033 Hypertensive retinopathy, bilateral: Secondary | ICD-10-CM | POA: Diagnosis not present

## 2018-07-31 DIAGNOSIS — H04123 Dry eye syndrome of bilateral lacrimal glands: Secondary | ICD-10-CM | POA: Diagnosis not present

## 2018-07-31 DIAGNOSIS — H2513 Age-related nuclear cataract, bilateral: Secondary | ICD-10-CM | POA: Diagnosis not present

## 2018-07-31 DIAGNOSIS — H2512 Age-related nuclear cataract, left eye: Secondary | ICD-10-CM | POA: Diagnosis not present

## 2018-08-01 DIAGNOSIS — G250 Essential tremor: Secondary | ICD-10-CM | POA: Diagnosis not present

## 2018-08-01 DIAGNOSIS — I4891 Unspecified atrial fibrillation: Secondary | ICD-10-CM | POA: Diagnosis not present

## 2018-08-01 DIAGNOSIS — F418 Other specified anxiety disorders: Secondary | ICD-10-CM | POA: Diagnosis not present

## 2018-08-01 DIAGNOSIS — S3219XD Other fracture of sacrum, subsequent encounter for fracture with routine healing: Secondary | ICD-10-CM | POA: Diagnosis not present

## 2018-08-01 DIAGNOSIS — G4089 Other seizures: Secondary | ICD-10-CM | POA: Diagnosis not present

## 2018-08-01 DIAGNOSIS — R2689 Other abnormalities of gait and mobility: Secondary | ICD-10-CM | POA: Diagnosis not present

## 2018-08-02 DIAGNOSIS — S3210XA Unspecified fracture of sacrum, initial encounter for closed fracture: Secondary | ICD-10-CM | POA: Diagnosis not present

## 2018-08-02 DIAGNOSIS — Z79899 Other long term (current) drug therapy: Secondary | ICD-10-CM | POA: Diagnosis not present

## 2018-08-02 DIAGNOSIS — G894 Chronic pain syndrome: Secondary | ICD-10-CM | POA: Diagnosis not present

## 2018-08-02 DIAGNOSIS — M5136 Other intervertebral disc degeneration, lumbar region: Secondary | ICD-10-CM | POA: Diagnosis not present

## 2018-08-03 DIAGNOSIS — F418 Other specified anxiety disorders: Secondary | ICD-10-CM | POA: Diagnosis not present

## 2018-08-03 DIAGNOSIS — G250 Essential tremor: Secondary | ICD-10-CM | POA: Diagnosis not present

## 2018-08-03 DIAGNOSIS — G4089 Other seizures: Secondary | ICD-10-CM | POA: Diagnosis not present

## 2018-08-03 DIAGNOSIS — I4891 Unspecified atrial fibrillation: Secondary | ICD-10-CM | POA: Diagnosis not present

## 2018-08-03 DIAGNOSIS — R2689 Other abnormalities of gait and mobility: Secondary | ICD-10-CM | POA: Diagnosis not present

## 2018-08-03 DIAGNOSIS — S3219XD Other fracture of sacrum, subsequent encounter for fracture with routine healing: Secondary | ICD-10-CM | POA: Diagnosis not present

## 2018-08-16 ENCOUNTER — Ambulatory Visit (INDEPENDENT_AMBULATORY_CARE_PROVIDER_SITE_OTHER): Payer: Medicare Other | Admitting: *Deleted

## 2018-08-16 DIAGNOSIS — Z5181 Encounter for therapeutic drug level monitoring: Secondary | ICD-10-CM

## 2018-08-16 DIAGNOSIS — Z7901 Long term (current) use of anticoagulants: Secondary | ICD-10-CM | POA: Diagnosis not present

## 2018-08-16 DIAGNOSIS — Z86711 Personal history of pulmonary embolism: Secondary | ICD-10-CM | POA: Diagnosis not present

## 2018-08-16 DIAGNOSIS — I2782 Chronic pulmonary embolism: Secondary | ICD-10-CM

## 2018-08-16 LAB — POCT INR: INR: 3.1 — AB (ref 2.0–3.0)

## 2018-08-22 DIAGNOSIS — H25812 Combined forms of age-related cataract, left eye: Secondary | ICD-10-CM | POA: Diagnosis not present

## 2018-08-22 DIAGNOSIS — H2513 Age-related nuclear cataract, bilateral: Secondary | ICD-10-CM | POA: Diagnosis not present

## 2018-08-22 DIAGNOSIS — H2512 Age-related nuclear cataract, left eye: Secondary | ICD-10-CM | POA: Diagnosis not present

## 2018-08-25 ENCOUNTER — Other Ambulatory Visit: Payer: Self-pay | Admitting: Internal Medicine

## 2018-08-27 DIAGNOSIS — H2511 Age-related nuclear cataract, right eye: Secondary | ICD-10-CM | POA: Diagnosis not present

## 2018-08-27 DIAGNOSIS — H25011 Cortical age-related cataract, right eye: Secondary | ICD-10-CM | POA: Diagnosis not present

## 2018-08-31 DIAGNOSIS — M5136 Other intervertebral disc degeneration, lumbar region: Secondary | ICD-10-CM | POA: Diagnosis not present

## 2018-08-31 DIAGNOSIS — G894 Chronic pain syndrome: Secondary | ICD-10-CM | POA: Diagnosis not present

## 2018-08-31 DIAGNOSIS — S3210XA Unspecified fracture of sacrum, initial encounter for closed fracture: Secondary | ICD-10-CM | POA: Diagnosis not present

## 2018-08-31 DIAGNOSIS — Z79899 Other long term (current) drug therapy: Secondary | ICD-10-CM | POA: Diagnosis not present

## 2018-09-03 ENCOUNTER — Ambulatory Visit (INDEPENDENT_AMBULATORY_CARE_PROVIDER_SITE_OTHER): Payer: Medicare Other | Admitting: Pharmacist

## 2018-09-03 ENCOUNTER — Encounter: Payer: Self-pay | Admitting: Cardiology

## 2018-09-03 ENCOUNTER — Ambulatory Visit (INDEPENDENT_AMBULATORY_CARE_PROVIDER_SITE_OTHER): Payer: Medicare Other | Admitting: Cardiology

## 2018-09-03 VITALS — BP 129/76 | HR 73 | Resp 16 | Ht 70.0 in | Wt 233.4 lb

## 2018-09-03 DIAGNOSIS — Z7189 Other specified counseling: Secondary | ICD-10-CM | POA: Diagnosis not present

## 2018-09-03 DIAGNOSIS — Z7901 Long term (current) use of anticoagulants: Secondary | ICD-10-CM | POA: Diagnosis not present

## 2018-09-03 DIAGNOSIS — I10 Essential (primary) hypertension: Secondary | ICD-10-CM

## 2018-09-03 DIAGNOSIS — I480 Paroxysmal atrial fibrillation: Secondary | ICD-10-CM

## 2018-09-03 DIAGNOSIS — Z6833 Body mass index (BMI) 33.0-33.9, adult: Secondary | ICD-10-CM | POA: Diagnosis not present

## 2018-09-03 DIAGNOSIS — I2782 Chronic pulmonary embolism: Secondary | ICD-10-CM

## 2018-09-03 DIAGNOSIS — Z5181 Encounter for therapeutic drug level monitoring: Secondary | ICD-10-CM | POA: Diagnosis not present

## 2018-09-03 DIAGNOSIS — Z86711 Personal history of pulmonary embolism: Secondary | ICD-10-CM | POA: Diagnosis not present

## 2018-09-03 DIAGNOSIS — E6609 Other obesity due to excess calories: Secondary | ICD-10-CM | POA: Diagnosis not present

## 2018-09-03 LAB — POCT INR: INR: 2.7 (ref 2.0–3.0)

## 2018-09-03 MED ORDER — DILTIAZEM HCL 30 MG PO TABS: ORAL_TABLET | ORAL | 6 refills | Status: DC
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
## 2018-09-03 NOTE — Patient Instructions (Signed)
Medication Instructions:  Your Physician recommend you continue on your current medication as directed.    If you need a refill on your cardiac medications before your next appointment, please call your pharmacy.   Lab work: None  Testing/Procedures: None  Follow-Up: At [facility], you and your health needs are our priority.  As part of our continuing mission to provide you with exceptional heart care, we have created designated Provider Care Teams.  These Care Teams include your primary Cardiologist (physician) and Advanced Practice Providers (APPs -  Physician Assistants and Nurse Practitioners) who all work together to provide you with the care you need, when you need it. You will need a follow up appointment in 3 months.  Please call our office 2 months in advance to schedule this appointment.  You may see Dr. Christopher or one of the following Advanced Practice Providers on your designated Care Team:   Rhonda Barrett, PA-C . Kathryn Lawrence, DNP, ANP  Any Other Special Instructions Will Be Listed Below (If Applicable).    

## 2018-09-04 NOTE — Progress Notes (Signed)
ekg added

## 2018-09-04 NOTE — Addendum Note (Signed)
Addended by: Valere Dross on: 09/04/2018 11:34 AM   Modules accepted: Orders

## 2018-09-05 DIAGNOSIS — H25011 Cortical age-related cataract, right eye: Secondary | ICD-10-CM | POA: Diagnosis not present

## 2018-09-05 DIAGNOSIS — H25811 Combined forms of age-related cataract, right eye: Secondary | ICD-10-CM | POA: Diagnosis not present

## 2018-09-05 DIAGNOSIS — H2513 Age-related nuclear cataract, bilateral: Secondary | ICD-10-CM | POA: Diagnosis not present

## 2018-09-05 DIAGNOSIS — H2511 Age-related nuclear cataract, right eye: Secondary | ICD-10-CM | POA: Diagnosis not present

## 2018-09-10 NOTE — Addendum Note (Signed)
Addended by: Valere Dross on: 09/10/2018 03:50 PM   Modules accepted: Orders

## 2018-09-10 NOTE — Addendum Note (Signed)
Addended by: Buford Dresser A on: 09/10/2018 05:51 PM   Modules accepted: Orders

## 2018-09-12 ENCOUNTER — Ambulatory Visit (INDEPENDENT_AMBULATORY_CARE_PROVIDER_SITE_OTHER): Payer: Medicare Other | Admitting: Family Medicine

## 2018-09-12 ENCOUNTER — Encounter: Payer: Self-pay | Admitting: Family Medicine

## 2018-09-12 VITALS — BP 126/84 | HR 75 | Resp 16 | Ht 70.0 in | Wt 234.3 lb

## 2018-09-12 DIAGNOSIS — F411 Generalized anxiety disorder: Secondary | ICD-10-CM | POA: Diagnosis not present

## 2018-09-12 DIAGNOSIS — N183 Chronic kidney disease, stage 3 unspecified: Secondary | ICD-10-CM

## 2018-09-12 DIAGNOSIS — M159 Polyosteoarthritis, unspecified: Secondary | ICD-10-CM

## 2018-09-12 DIAGNOSIS — E559 Vitamin D deficiency, unspecified: Secondary | ICD-10-CM | POA: Diagnosis not present

## 2018-09-12 DIAGNOSIS — R5382 Chronic fatigue, unspecified: Secondary | ICD-10-CM | POA: Diagnosis not present

## 2018-09-12 DIAGNOSIS — Z23 Encounter for immunization: Secondary | ICD-10-CM | POA: Diagnosis not present

## 2018-09-12 DIAGNOSIS — M797 Fibromyalgia: Secondary | ICD-10-CM | POA: Diagnosis not present

## 2018-09-12 DIAGNOSIS — I10 Essential (primary) hypertension: Secondary | ICD-10-CM

## 2018-09-12 LAB — VITAMIN D 25 HYDROXY (VIT D DEFICIENCY, FRACTURES): VITD: 45.75 ng/mL (ref 30.00–100.00)

## 2018-09-12 NOTE — Assessment & Plan Note (Signed)
No changes in current management, will follow labs done today and will give further recommendations accordingly.  
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
## 2018-09-12 NOTE — Assessment & Plan Note (Signed)
BP adequately controlled. No changes in current management. Continue monitoring BP at home and low-salt diet. Eye exam is current. Follow-up in 6 months.

## 2018-09-12 NOTE — Assessment & Plan Note (Signed)
We discussed possible etiologies, this problem is chronic. Medications on chronic health issues could be aggravating problem. Regular physical activity as tolerated may help.

## 2018-09-12 NOTE — Assessment & Plan Note (Signed)
Overall stable. Continue Cymbalta 40 mg daily. She will continue following with pain clinic. Fall precautions discussed.

## 2018-09-12 NOTE — Assessment & Plan Note (Signed)
We discussed signs and symptoms of disease as well as treatment options. She is on gabapentin and Cymbalta. Low impact exercise as tolerated as well as good sleep hygiene recommended. No changes in current management. Follow-up in 6 months.

## 2018-09-12 NOTE — Patient Instructions (Addendum)
A few things to remember from today's visit:   Generalized osteoarthritis of multiple sites  Essential hypertension  Generalized anxiety disorder  Stage 3 chronic kidney disease (HCC)  Fibromyalgia  Vitamin D deficiency, unspecified - Plan: VITAMIN D 25 Hydroxy (Vit-D Deficiency, Fractures)  Problems seems to be stable. No changes in any of your medications today. We are checking vitamin D today and will adjust ergocalciferol depending of results. I will see you back in 6 months, before if needed.  Myofascial Pain Syndrome and Fibromyalgia Myofascial pain syndrome and fibromyalgia are both pain disorders. This pain may be felt mainly in your muscles.  Myofascial pain syndrome: ? Always has trigger points or tender points in the muscle that will cause pain when pressed. The pain may come and go. ? Usually affects your neck, upper back, and shoulder areas. The pain often radiates into your arms and hands.  Fibromyalgia: ? Has muscle pains and tenderness that come and go. ? Is often associated with fatigue and sleep disturbances. ? Has trigger points. ? Tends to be long-lasting (chronic), but is not life-threatening.  Fibromyalgia and myofascial pain are not the same. However, they often occur together. If you have both conditions, each can make the other worse. Both are common and can cause enough pain and fatigue to make day-to-day activities difficult. What are the causes? The exact causes of fibromyalgia and myofascial pain are not known. People with certain gene types may be more likely to develop fibromyalgia. Some factors can be triggers for both conditions, such as:  Spine disorders.  Arthritis.  Severe injury (trauma) and other physical stressors.  Being under a lot of stress.  A medical illness.  What are the signs or symptoms? Fibromyalgia The main symptom of fibromyalgia is widespread pain and tenderness in your muscles. This can vary over time. Pain is  sometimes described as stabbing, shooting, or burning. You may have tingling or numbness, too. You may also have sleep problems and fatigue. You may wake up feeling tired and groggy (fibro fog). Other symptoms may include:  Bowel and bladder problems.  Headaches.  Visual problems.  Problems with odors and noises.  Depression or mood changes.  Painful menstrual periods (dysmenorrhea).  Dry skin or eyes.  Myofascial pain syndrome Symptoms of myofascial pain syndrome include:  Tight, ropy bands of muscle.  Uncomfortable sensations in muscular areas, such as: ? Aching. ? Cramping. ? Burning. ? Numbness. ? Tingling. ? Muscle weakness.  Trouble moving certain muscles freely (range of motion).  How is this diagnosed? There are no specific tests to diagnose fibromyalgia or myofascial pain syndrome. Both can be hard to diagnose because their symptoms are common in many other conditions. Your health care provider may suspect one or both of these conditions based on your symptoms and medical history. Your health care provider will also do a physical exam. The key to diagnosing fibromyalgia is having pain, fatigue, and other symptoms for more than three months that cannot be explained by another condition. The key to diagnosing myofascial pain syndrome is finding trigger points in muscles that are tender and cause pain elsewhere in your body (referred pain). How is this treated? Treating fibromyalgia and myofascial pain often requires a team of health care providers. This usually starts with your primary provider and a physical therapist. You may also find it helpful to work with alternative health care providers, such as massage therapists or acupuncturists. Treatment for fibromyalgia may include medicines. This may include nonsteroidal anti-inflammatory drugs (  NSAIDs), along with other medicines. Treatment for myofascial pain may also include:  NSAIDs.  Cooling and stretching of  muscles.  Trigger point injections.  Sound wave (ultrasound) treatments to stimulate muscles.  Follow these instructions at home:  Take medicines only as directed by your health care provider.  Exercise as directed by your health care provider or physical therapist.  Try to avoid stressful situations.  Practice relaxation techniques to control your stress. You may want to try: ? Biofeedback. ? Visual imagery. ? Hypnosis. ? Muscle relaxation. ? Yoga. ? Meditation.  Talk to your health care provider about alternative treatments, such as acupuncture or massage treatment.  Maintain a healthy lifestyle. This includes eating a healthy diet and getting enough sleep.  Consider joining a support group.  Do not do activities that stress or strain your muscles. That includes repetitive motions and heavy lifting. Where to find more information:  National Fibromyalgia Association: www.fmaware.[place]: www.arthritis.org  American Chronic Pain Association: [employer].dk Contact a health care provider if:  You have new symptoms.  Your symptoms get worse.  You have side effects from your medicines.  You have trouble sleeping.  Your condition is causing depression or anxiety. This information is not intended to replace advice given to you by your health care provider. Make sure you discuss any questions you have with your health care provider. Document Released: 10/24/2005 Document Revised: 03/31/2016 Document Reviewed: 07/30/2014 Elsevier Interactive Patient Education  2018 [employer].  Please be sure medication list is accurate. If a new problem present, please set up appointment sooner than planned today.

## 2018-09-12 NOTE — Assessment & Plan Note (Signed)
It has been stable. Adequate hydration recommended. Adequate BP control. Avoid NSAIDs. Continue low-salt diet.

## 2018-09-14 MED ORDER — VITAMIN D (ERGOCALCIFEROL) 1.25 MG (50000 UNIT) PO CAPS: ORAL_CAPSULE | ORAL | 2 refills | Status: DC

## 2018-09-14 MED ORDER — DULOXETINE HCL 20 MG PO CPEP: 40.0000 mg | ORAL_CAPSULE | Freq: Every day | ORAL | 1 refills | Status: DC

## 2018-10-01 DIAGNOSIS — S3210XA Unspecified fracture of sacrum, initial encounter for closed fracture: Secondary | ICD-10-CM | POA: Diagnosis not present

## 2018-10-01 DIAGNOSIS — Z79899 Other long term (current) drug therapy: Secondary | ICD-10-CM | POA: Diagnosis not present

## 2018-10-01 DIAGNOSIS — M5136 Other intervertebral disc degeneration, lumbar region: Secondary | ICD-10-CM | POA: Diagnosis not present

## 2018-10-01 DIAGNOSIS — G894 Chronic pain syndrome: Secondary | ICD-10-CM | POA: Diagnosis not present

## 2018-10-15 ENCOUNTER — Ambulatory Visit (INDEPENDENT_AMBULATORY_CARE_PROVIDER_SITE_OTHER): Payer: Medicare Other | Admitting: *Deleted

## 2018-10-15 DIAGNOSIS — I2782 Chronic pulmonary embolism: Secondary | ICD-10-CM

## 2018-10-15 DIAGNOSIS — Z7901 Long term (current) use of anticoagulants: Secondary | ICD-10-CM | POA: Diagnosis not present

## 2018-10-15 DIAGNOSIS — Z86711 Personal history of pulmonary embolism: Secondary | ICD-10-CM | POA: Diagnosis not present

## 2018-10-15 DIAGNOSIS — Z5181 Encounter for therapeutic drug level monitoring: Secondary | ICD-10-CM

## 2018-10-15 LAB — POCT INR: INR: 3.1 — AB (ref 2.0–3.0)

## 2018-10-15 NOTE — Patient Instructions (Signed)
Description   Today only take 1/2 tablet, eat a large serving of leafy green vegetable. Continue on same dosage 1 tablet daily except 1/2 tablet on Sundays. Recheck INR in 4 weeks. Call Coumadin clinic with any concerns [phone].

## 2018-10-16 DIAGNOSIS — M25551 Pain in right hip: Secondary | ICD-10-CM | POA: Diagnosis not present

## 2018-10-24 IMAGING — DX DG CHEST 2V
2 series · 2 of 2 positions shown · non-contrast
Comparison: Chest radiograph, 09/10/2016.  Chest CT, 10/12/2016.

CLINICAL DATA: F/U Pneumonia, SOB X 3 mos., HX pulmonary embolism,
Lt.pleural effusion

EXAM:
CHEST  2 VIEW

[chest pa]
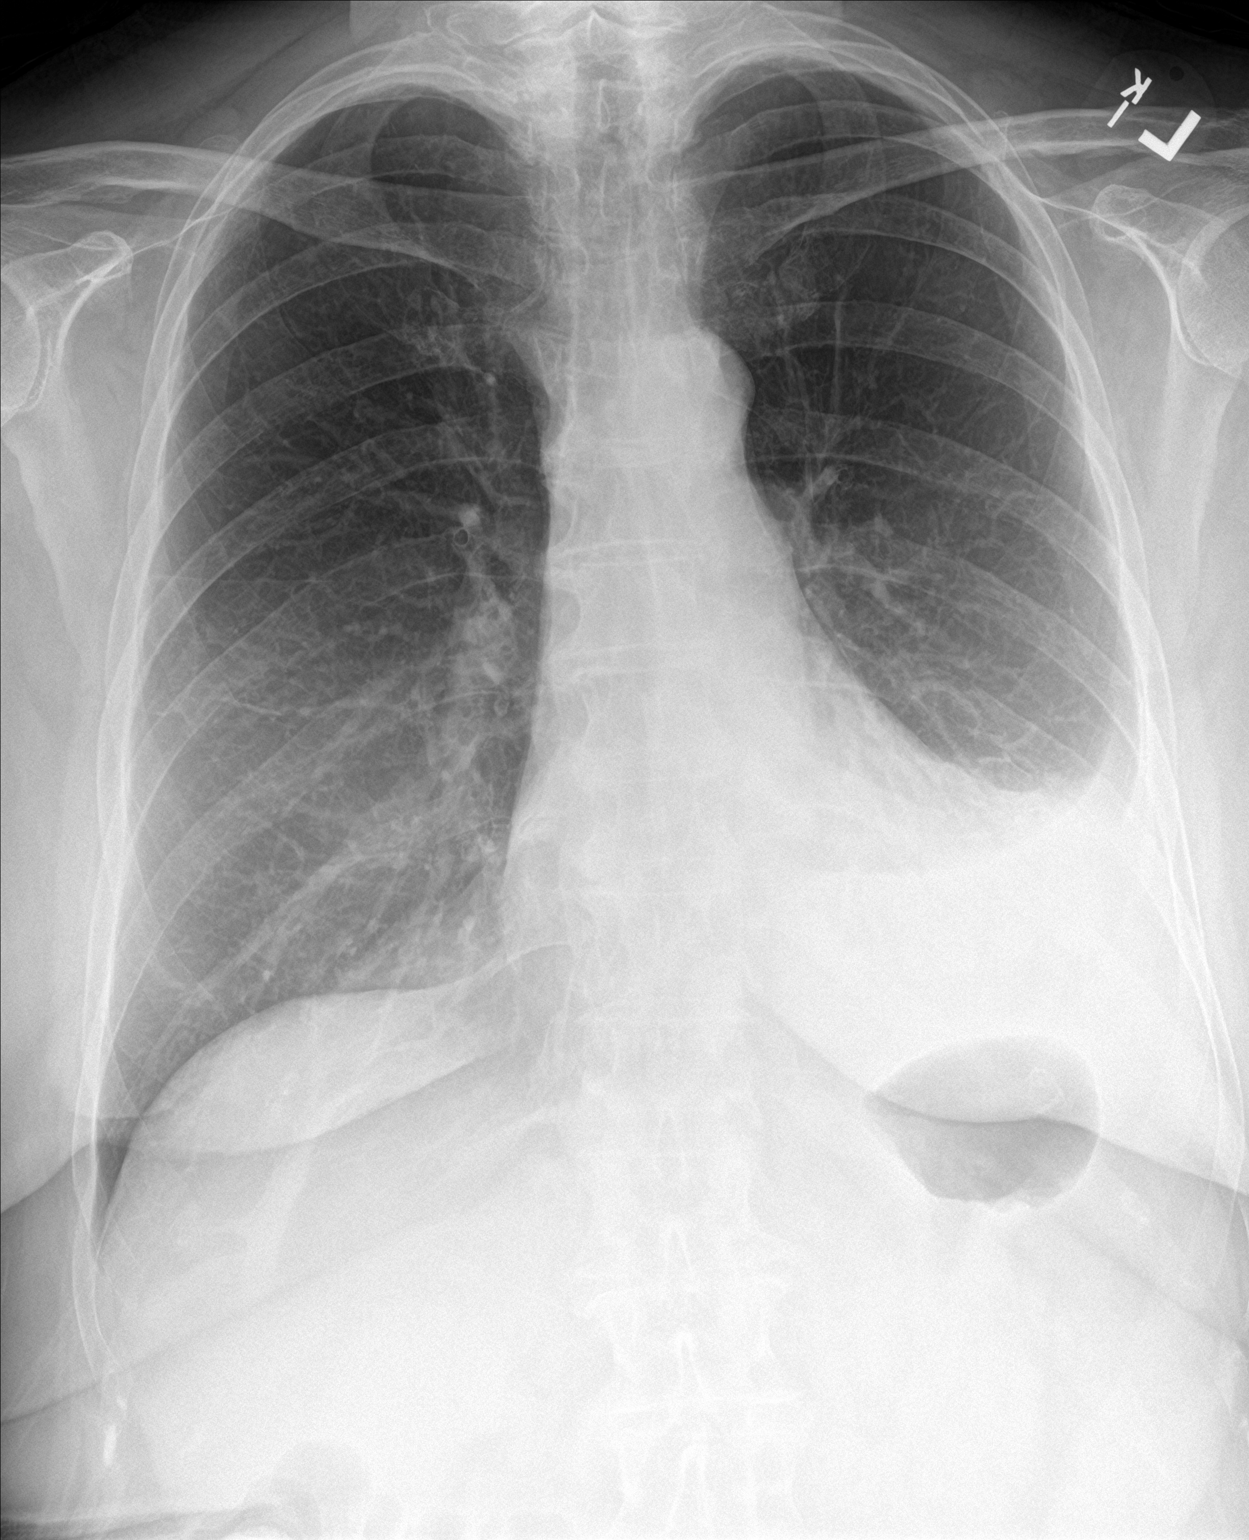

[chest lat]
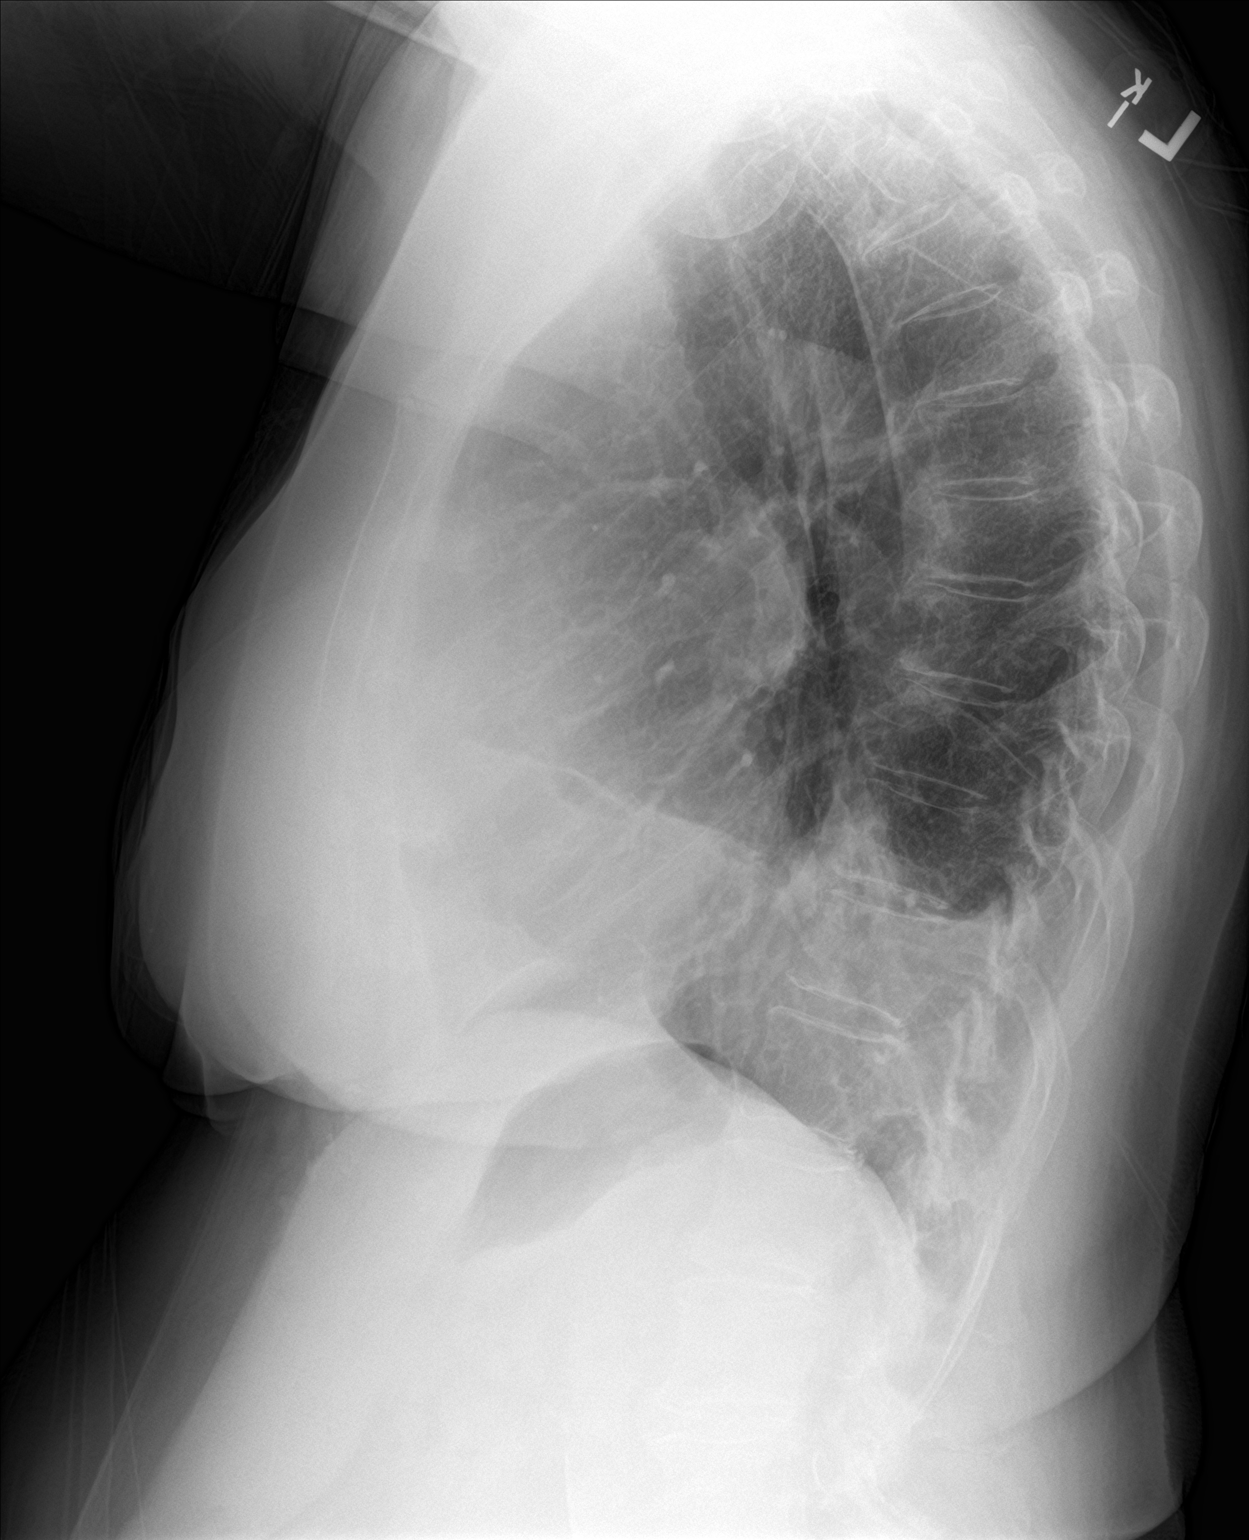

[2 of 2 positions shown; findings below may reference images not displayed]

FINDINGS: Moderate left pleural effusion obscures the left hemidiaphragm and a
part of the left heart border, mildly decreased in size from the
prior chest radiograph. There is associated left lung base
parenchymal opacity consistent with atelectasis. Pneumonia is not
excluded but felt less likely.

Remainder of the lungs is clear. Lungs are hyperexpanded. No right
pleural effusion.

No pneumothorax.

Heart, mediastinum and hila are unremarkable.

Skeletal structures are demineralized but intact.
IMPRESSION: 1. Moderate left pleural effusion, mildly decreased from the prior
chest radiograph.
2. Left lung base parenchymal opacity that is most likely
atelectasis. A component of pneumonia is not excluded but felt less
likely.
3. Hyperexpanded but otherwise clear lungs.

## 2018-10-27 IMAGING — US US THORACENTESIS ASP PLEURAL SPACE W/IMG GUIDE
1 series · 6 of 6 positions shown · non-contrast
Comparison: none

INDICATION: Patient with left pleural effusion. Request made for diagnostic and
therapeutic left thoracentesis.

[Series 1: us thoracentesis asp pleural space w/img guide · 0.26mm/px · 6 of 6 slices shown]
[im 1/6]
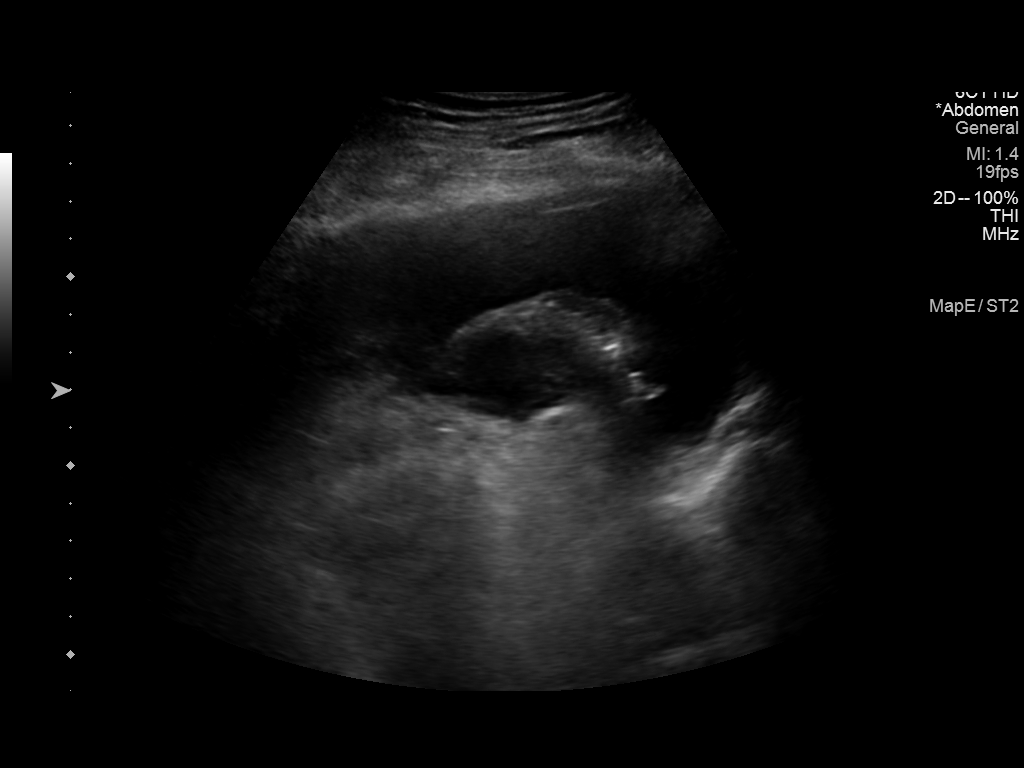
[im 2/6]
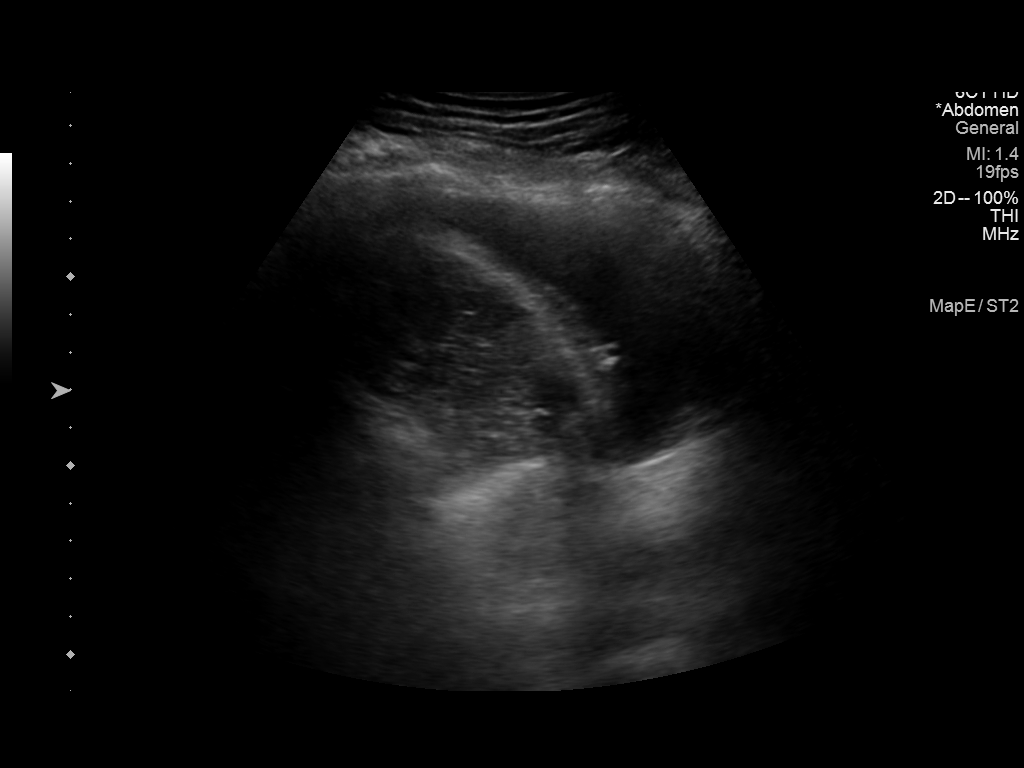
[im 3/6]
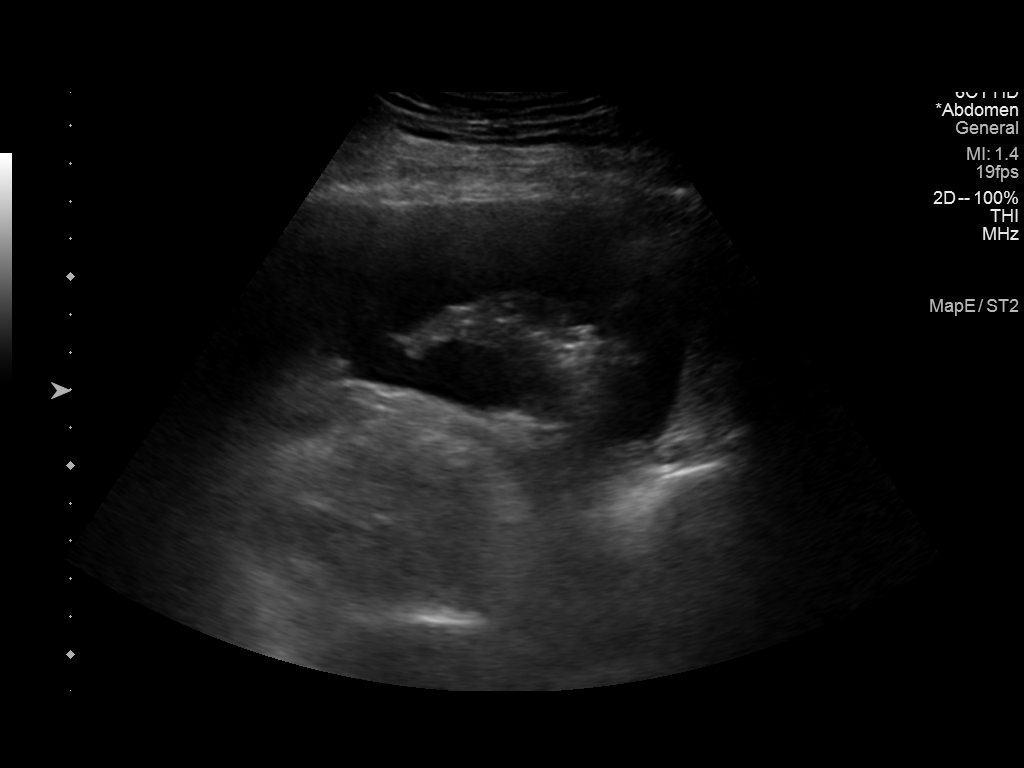
[im 4/6]
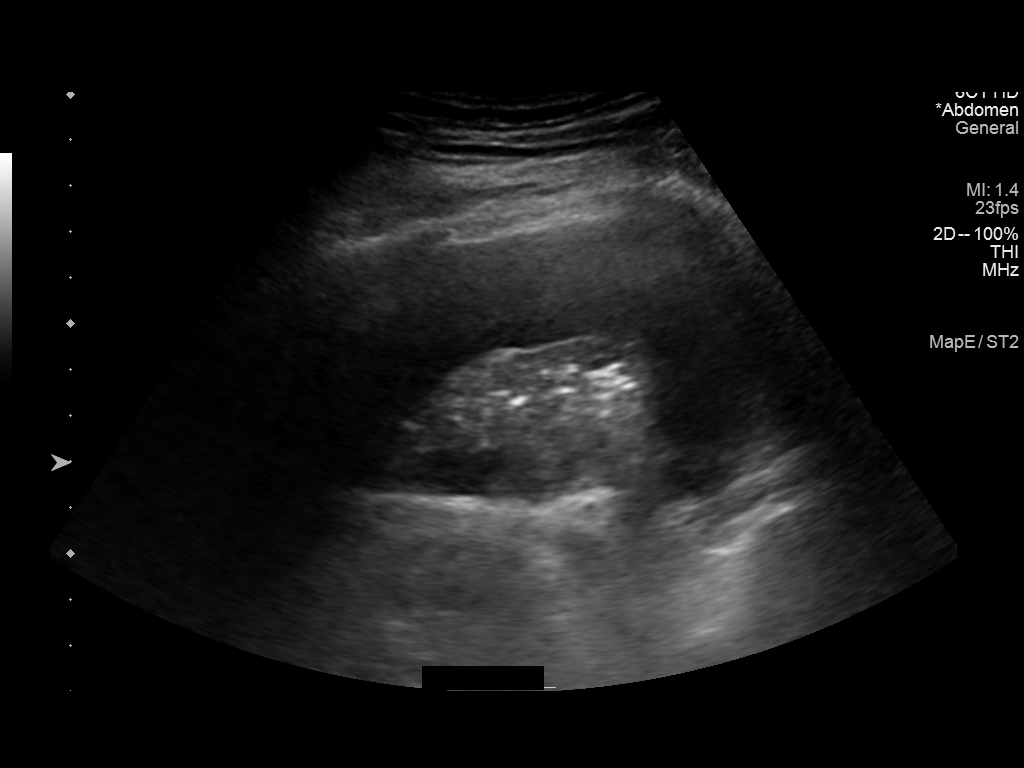
[im 5/6]
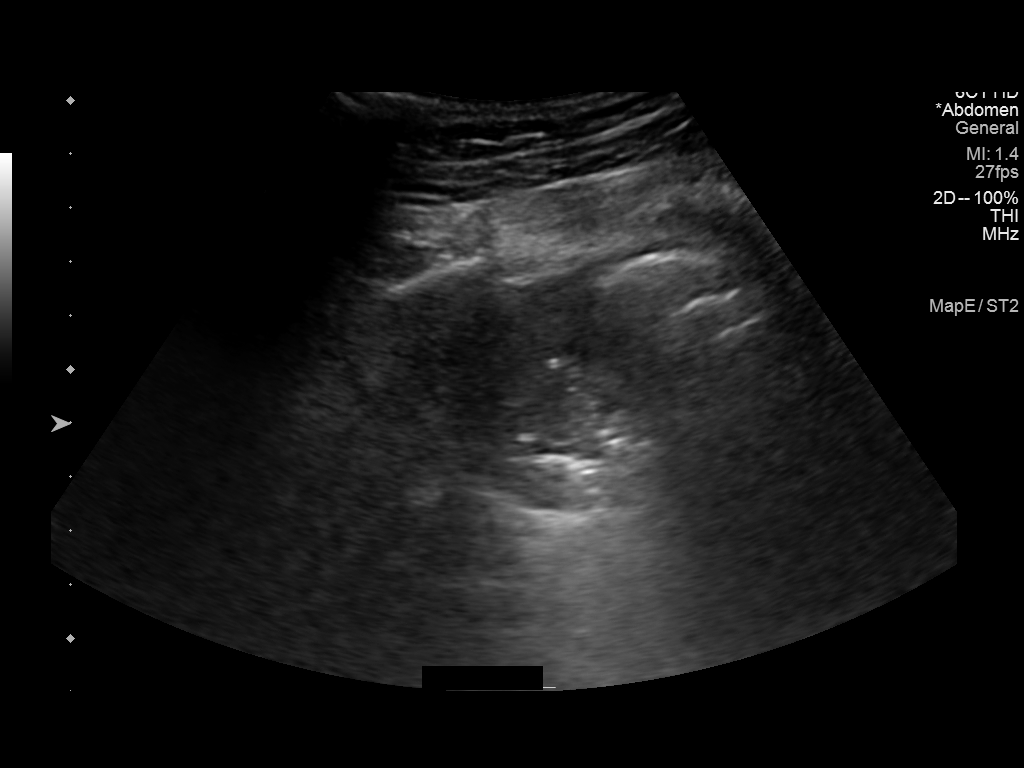
[im 6/6]
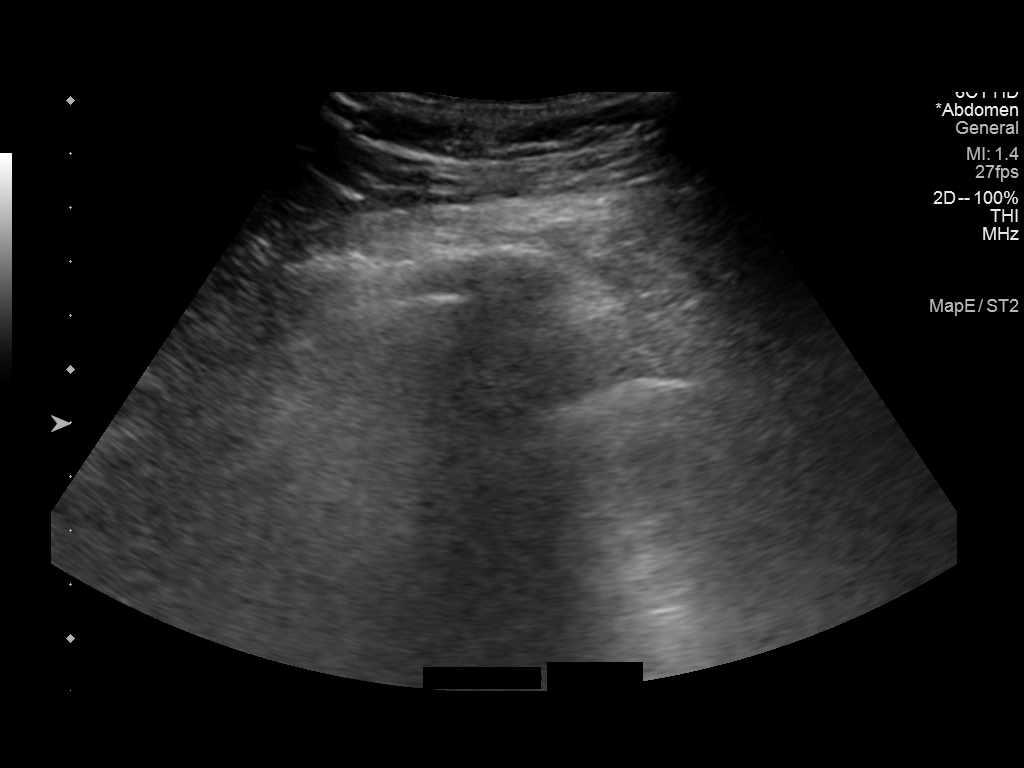

[6 of 6 positions shown; findings below may reference images not displayed]

EXAM:
ULTRASOUND GUIDED DIAGNOSTIC AND THERAPEUTIC LEFT THORACENTESIS

MEDICATIONS:
1% lidocaine

COMPLICATIONS:
None immediate.

PROCEDURE:
An ultrasound guided thoracentesis was thoroughly discussed with the
patient and questions answered. The benefits, risks, alternatives
and complications were also discussed. The patient understands and
wishes to proceed with the procedure. Written consent was obtained.

Ultrasound was performed to localize and mark an adequate pocket of
fluid in the left chest. The area was then prepped and draped in the
normal sterile fashion. 1% Lidocaine was used for local anesthesia.
Under ultrasound guidance a Sate-T-Centesis catheter was introduced.
Thoracentesis was performed. The catheter was removed and a dressing
applied.
FINDINGS: A total of approximately 230 mL of blood-tinged, clear fluid was
removed. Samples were sent to the laboratory as requested by the
clinical team.
IMPRESSION: Successful ultrasound guided diagnostic and therapeutic left
thoracentesis yielding 230 mL of pleural fluid.

## 2018-10-29 DIAGNOSIS — M25551 Pain in right hip: Secondary | ICD-10-CM | POA: Diagnosis not present

## 2018-10-29 DIAGNOSIS — M47816 Spondylosis without myelopathy or radiculopathy, lumbar region: Secondary | ICD-10-CM | POA: Diagnosis not present

## 2018-11-01 DIAGNOSIS — Z79899 Other long term (current) drug therapy: Secondary | ICD-10-CM | POA: Diagnosis not present

## 2018-11-01 DIAGNOSIS — S3210XA Unspecified fracture of sacrum, initial encounter for closed fracture: Secondary | ICD-10-CM | POA: Diagnosis not present

## 2018-11-01 DIAGNOSIS — M533 Sacrococcygeal disorders, not elsewhere classified: Secondary | ICD-10-CM | POA: Diagnosis not present

## 2018-11-01 DIAGNOSIS — G894 Chronic pain syndrome: Secondary | ICD-10-CM | POA: Diagnosis not present

## 2018-11-06 ENCOUNTER — Ambulatory Visit (INDEPENDENT_AMBULATORY_CARE_PROVIDER_SITE_OTHER): Payer: Medicare Other | Admitting: *Deleted

## 2018-11-06 DIAGNOSIS — Z86711 Personal history of pulmonary embolism: Secondary | ICD-10-CM | POA: Diagnosis not present

## 2018-11-06 DIAGNOSIS — I2782 Chronic pulmonary embolism: Secondary | ICD-10-CM

## 2018-11-06 DIAGNOSIS — Z7901 Long term (current) use of anticoagulants: Secondary | ICD-10-CM

## 2018-11-06 DIAGNOSIS — Z5181 Encounter for therapeutic drug level monitoring: Secondary | ICD-10-CM

## 2018-11-06 LAB — POCT INR: INR: 3 (ref 2.0–3.0)

## 2018-11-06 NOTE — Patient Instructions (Signed)
Description   Today take 1/2 tablet then continue on same dosage 1 tablet daily except 1/2 tablet on Sundays. Recheck INR in 4 weeks. Call Coumadin clinic with any concerns [phone].

## 2018-11-08 ENCOUNTER — Other Ambulatory Visit: Payer: Self-pay | Admitting: *Deleted

## 2018-11-08 DIAGNOSIS — E559 Vitamin D deficiency, unspecified: Secondary | ICD-10-CM

## 2018-11-08 MED ORDER — VITAMIN D (ERGOCALCIFEROL) 1.25 MG (50000 UNIT) PO CAPS: ORAL_CAPSULE | ORAL | 2 refills | Status: DC

## 2018-11-16 ENCOUNTER — Ambulatory Visit (INDEPENDENT_AMBULATORY_CARE_PROVIDER_SITE_OTHER): Payer: Medicare Other | Admitting: Nurse Practitioner

## 2018-11-16 ENCOUNTER — Encounter: Payer: Self-pay | Admitting: Nurse Practitioner

## 2018-11-16 ENCOUNTER — Encounter

## 2018-11-16 VITALS — BP 116/78 | HR 70 | Ht 70.0 in | Wt 232.2 lb

## 2018-11-16 DIAGNOSIS — K5900 Constipation, unspecified: Secondary | ICD-10-CM | POA: Diagnosis not present

## 2018-11-16 DIAGNOSIS — K648 Other hemorrhoids: Secondary | ICD-10-CM | POA: Diagnosis not present

## 2018-11-16 DIAGNOSIS — K625 Hemorrhage of anus and rectum: Secondary | ICD-10-CM | POA: Diagnosis not present

## 2018-11-16 MED ORDER — HYDROCORTISONE 2.5 % RE CREA: 1.0000 "application " | TOPICAL_CREAM | Freq: Every day | RECTAL | 0 refills | Status: DC

## 2018-11-16 NOTE — Progress Notes (Signed)
Chief Complaint:   Rectal bleeding.    IMPRESSION and PLAN:     13.  73 year old female here with painless rectal bleeding for ~ two weeks, resolved two days ago. Bleeding in setting of constipation with some hard stools at times.  Inflamed internal hemorrhoids on anoscopy, probably the source of bleeding in the setting of anticoagulation. Her last colonoscopy was reportedly in 2013 in [place], results unknown.  Patient gives a [facility] of colon cancer in father though he was not diagnosed until in his seventh decade.  -It has been 6 to 7 years since patient's last colonoscopy.  Though not unreasonable to repeat colonoscopy, she has multiple medical problems increasing risk of the procedure. Furthermore patient says the last time her anticoagulant was held she developed a blood clot in her lung.  She is understandably concerned about proceeding with a colonoscopy.  -I explained that the bleeding is probably hemorrhoidal but of course there is no way to exclude colon neoplasm or other pathology without colonoscopy which of course does come with risks.  -Plan is to treat constipation, treat hemorrhoids with steroid cream and return to see me in a few weeks to make a final decision on whether to pursue colonoscopy.   2. Constipation, hard stools.  She does seem to have a bowel movement every day despite being off the MiraLAX for a month.  Warm drinks seem to work as well as the [employer].   Explained the hard stools can aggravate hemorrhoids and that I would recommend to stool softeners at bedtime.  Additionally I advised her to restart the MiraLAX at least every other day.  She is intolerant of fiber  HPI:     Patient is a 73 year old female known to Dr. Silverio Decamp.. She has multiple medical problems not limited to of AFIB on Coumadin, chronic pain /fibromyalgia followed by pain management, hypertension, partial seizures, CKD, anxiety, and obesity.  In September 2019 she sustained a sacral fracture  after a fall at home.  No surgical intervention was required.  She still struggles with pain in the area.  Ms. Lieutenant Diego comes in for evaluation of rectal bleeding.  She started bleeding at the very end of December.  The bleeding continued until 2 days ago.  Patient used to take MiraLAX for constipation but has not had any in a month.  She has been drinking hot drinks which seem to work just as well.  Though after additional questioning she does admit to some hard stools sometimes.  Patient says she has not been able to tolerate fiber in the past but cannot really remember why.  No significant rectal pain.  She does describe intermittent RLQ discomfort especially just prior to defecation.  The discomfort is usually relieved with defecation.  Patient initially described the pain as starting around the same time as the bleeding but then after additional questioning realized the pain had been present for a year or more.  Weight overall stable based on ones documented in EMR.   Patient is a family history of colorectal cancer in her father.  Apparently the cancer was not diagnosed until he was in his 72s, finally passed away at age 76.  Patient had a colonoscopy in [facility] in 2013.  She is sure about the date but does not recall findings.    Review of systems:     No chest pain, chronic SOB, fatigue, no fevers, no urinary sx   Past Medical History:  Diagnosis  Date  . Atrial fibrillation ([place])   . Benign essential tremor   . Breast cancer ([place])   . Chronic renal insufficiency   . Common migraine 05/07/2015  . Depression with anxiety   . Drowsiness    Excessive daytime  . Dyslipidemia   . Fibromyalgia   . History of partial seizures   . Hypertension   . Memory disturbance   . Muscle tension headache   . Obesity   . Obstructive sleep apnea   . S/P epidural steroid injection   . Sacral contusion 07/2018  . Seizures ([place])    Partial  . Sprain of neck 06/26/2013  . Tremors of nervous system   .  Vitamin B12 deficiency     Patient's surgical history, family medical history, social history, medications and allergies were all reviewed in Epic   Creatinine clearance cannot be calculated (Patient's most recent lab result is older than the maximum 21 days allowed.)  Current Outpatient Medications  Medication Sig Dispense Refill  . acetaminophen (TYLENOL) 325 MG tablet Take 2 tablets (650 mg total) by mouth every 6 (six) hours.    . calcium carbonate (OS-CAL) 600 MG TABS tablet Take 600 mg by mouth daily with breakfast.    . diltiazem (CARDIZEM CD) 120 MG 24 hr capsule TAKE 1 CAPSULE BY MOUTH ONCE DAILY 90 capsule 2  . diltiazem (CARDIZEM) 30 MG tablet Take 1 tablet as needed for palpitations or heart rate greater than 110 30 tablet 6  . DULoxetine (CYMBALTA) 20 MG capsule Take 2 capsules (40 mg total) by mouth daily. 180 capsule 1  . gabapentin (NEURONTIN) 600 MG tablet Take 1 tablet (600 mg total) by mouth 3 (three) times daily. 540 tablet 0  . HYDROcodone-acetaminophen (NORCO/VICODIN) 5-325 MG tablet Take by mouth.    . lamoTRIgine (LAMICTAL) 25 MG tablet Take 1 tablet (25 mg total) by mouth 2 (two) times daily. 180 tablet 1  . polyethylene glycol (MIRALAX / GLYCOLAX) packet Take 17 g by mouth as needed.    . Vitamin D, Ergocalciferol, (DRISDOL) 1.25 MG (50000 UT) CAPS capsule 1 cap every 10 days. 12 capsule 2  . warfarin (COUMADIN) 5 MG tablet TAKE 1 TABLET BY MOUTH ONCE DAILY AS DIRECTED 30 tablet 2  . hydrocortisone (ANUSOL-HC) 2.5 % rectal cream Place 1 application rectally at bedtime. Use for 10 days. 30 g 0   No current facility-administered medications for this visit.     Physical Exam:     BP 116/78   Pulse 70   Ht 5\' 10"  (1.778 m)   Wt 232 lb 4 oz (105.3 kg)   BMI 33.32 kg/m   GENERAL:  Pleasant obese female in NAD PSYCH: : Cooperative, normal affect EENT:  conjunctiva pink, mucous membranes moist, neck supple without masses CARDIAC:  RRR, no murmur heard, no  peripheral edema PULM: Normal respiratory effort, lungs CTA bilaterally, no wheezing ABDOMEN:  Nondistended, soft, mild diffuse tenderness to even light palpation. No obvious masses, no hepatomegaly,  normal bowel sounds SKIN:  turgor, no lesions seen NEURO: Alert and oriented x 3, no focal neurologic deficits   Tye Savoy , NP 11/16/2018, 11:06 AM

## 2018-11-16 NOTE — Patient Instructions (Addendum)
If you are age 73 or older, your body mass index should be between 23-30. Your Body mass index is 33.32 kg/m. If this is out of the aforementioned range listed, please consider follow up with your Primary Care Provider.  If you are age 66 or younger, your body mass index should be between 19-25. Your Body mass index is 33.32 kg/m. If this is out of the aformentioned range listed, please consider follow up with your Primary Care Provider.   We have sent the following medications to your pharmacy for you to pick up at your convenience: Anusol cream  Resume Miralax daily or at least every other day.  Use Colace 2 at bedtime.  Follow up with me in February.  The schedule is not available at this time.  Please call the office the end of January for an appointment.  Thank you for choosing me and [facility].   Tye Savoy, NP

## 2018-11-19 DIAGNOSIS — M25551 Pain in right hip: Secondary | ICD-10-CM | POA: Diagnosis not present

## 2018-11-21 ENCOUNTER — Other Ambulatory Visit: Payer: Self-pay | Admitting: Orthopaedic Surgery

## 2018-11-21 DIAGNOSIS — M25551 Pain in right hip: Secondary | ICD-10-CM

## 2018-11-21 DIAGNOSIS — M533 Sacrococcygeal disorders, not elsewhere classified: Secondary | ICD-10-CM

## 2018-11-22 NOTE — Progress Notes (Signed)
Given family history of colon cancer, last colonoscopy greater than 5 years and rectal bleeding.  Patient will need colonoscopy to exclude neoplastic lesion.  If it is contraindicated or considered higher risk for patient to hold anticoagulation based on prior history we can proceed with colonoscopy while she is still taking anticoagulants.  Will need to discuss with PMD/prescribing provider

## 2018-11-27 NOTE — Progress Notes (Signed)
Will make sure she has a follow up with me for further discussion of colonoscopy

## 2018-11-29 ENCOUNTER — Telehealth: Payer: Self-pay

## 2018-11-29 NOTE — Telephone Encounter (Signed)
Called the patient. She reports she is no longer seeing any blood from the rectum. She is taking daily Miralax. She is having daily bowel movements. Denies difficulty or incomplete movement. She declines an appointment at this time.  Please advise.

## 2018-11-29 NOTE — Telephone Encounter (Signed)
-----   Message from Willia Craze, NP sent at 11/27/2018 10:35 AM EST ----- Debbie [name], will you please call patient and make sure she has a follow-up with me regarding bleeding/hemorrhoids.  We are going to have further discussion about colonoscopy and her anticoagulant.  She can see me anytime in the next couple of weeks.  Thanks

## 2018-12-04 ENCOUNTER — Encounter: Payer: Self-pay | Admitting: Cardiology

## 2018-12-04 ENCOUNTER — Ambulatory Visit (INDEPENDENT_AMBULATORY_CARE_PROVIDER_SITE_OTHER): Payer: Medicare Other | Admitting: Cardiology

## 2018-12-04 ENCOUNTER — Other Ambulatory Visit: Payer: Self-pay

## 2018-12-04 ENCOUNTER — Ambulatory Visit (INDEPENDENT_AMBULATORY_CARE_PROVIDER_SITE_OTHER): Payer: Medicare Other | Admitting: Pharmacist

## 2018-12-04 VITALS — BP 128/72 | HR 69 | Ht 68.0 in | Wt 230.1 lb

## 2018-12-04 DIAGNOSIS — Z86711 Personal history of pulmonary embolism: Secondary | ICD-10-CM

## 2018-12-04 DIAGNOSIS — I480 Paroxysmal atrial fibrillation: Secondary | ICD-10-CM | POA: Diagnosis not present

## 2018-12-04 DIAGNOSIS — Z7901 Long term (current) use of anticoagulants: Secondary | ICD-10-CM

## 2018-12-04 DIAGNOSIS — Z7189 Other specified counseling: Secondary | ICD-10-CM

## 2018-12-04 DIAGNOSIS — Z5181 Encounter for therapeutic drug level monitoring: Secondary | ICD-10-CM | POA: Diagnosis not present

## 2018-12-04 DIAGNOSIS — I2782 Chronic pulmonary embolism: Secondary | ICD-10-CM

## 2018-12-04 LAB — POCT INR: INR: 2.9 (ref 2.0–3.0)

## 2018-12-04 MED ORDER — WARFARIN SODIUM 5 MG PO TABS: 5.0000 mg | ORAL_TABLET | Freq: Every day | ORAL | 2 refills | Status: DC

## 2018-12-04 MED ORDER — DILTIAZEM HCL ER COATED BEADS 240 MG PO CP24: 240.0000 mg | ORAL_CAPSULE | Freq: Every day | ORAL | 3 refills | Status: DC
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
## 2018-12-04 NOTE — Patient Instructions (Signed)
Medication Instructions:  Increase: Cardizem 240 mg daily   If you need a refill on your cardiac medications before your next appointment, please call your pharmacy.   Lab work: None  Testing/Procedures: None  Follow-Up: At [facility], you and your health needs are our priority.  As part of our continuing mission to provide you with exceptional heart care, we have created designated Provider Care Teams.  These Care Teams include your primary Cardiologist (physician) and Advanced Practice Providers (APPs -  Physician Assistants and Nurse Practitioners) who all work together to provide you with the care you need, when you need it. You will need a follow up appointment in 6 weeks.  Please call our office 2 months in advance to schedule this appointment.  You may see Buford Dresser, MD or one of the following Advanced Practice Providers on your designated Care Team:   Rosaria Ferries, PA-C . Jory Sims, DNP, ANP

## 2018-12-05 ENCOUNTER — Ambulatory Visit
Admission: RE | Admit: 2018-12-05 | Discharge: 2018-12-05 | Disposition: A | Discharge status: Home / Self Care | Payer: Medicare Other | Source: Ambulatory Visit | Attending: Orthopaedic Surgery | Admitting: Orthopaedic Surgery

## 2018-12-05 ENCOUNTER — Other Ambulatory Visit: Payer: Medicare Other

## 2018-12-05 DIAGNOSIS — S79911A Unspecified injury of right hip, initial encounter: Secondary | ICD-10-CM | POA: Diagnosis not present

## 2018-12-05 DIAGNOSIS — M533 Sacrococcygeal disorders, not elsewhere classified: Secondary | ICD-10-CM

## 2018-12-05 DIAGNOSIS — M25551 Pain in right hip: Secondary | ICD-10-CM | POA: Diagnosis not present

## 2018-12-07 DIAGNOSIS — M25551 Pain in right hip: Secondary | ICD-10-CM | POA: Diagnosis not present

## 2018-12-10 ENCOUNTER — Encounter: Payer: Self-pay | Admitting: Cardiology

## 2018-12-13 NOTE — Telephone Encounter (Signed)
Debbie [name],  Please talk with patient and see if she is at least interested in returning to talk about colonoscopy. She has a [facility] of colon cancer and with the bleeding she should have a colonoscopy. Dr. Silverio Decamp is willing to do procedure ON blood thinner if need be. Thanks

## 2018-12-13 NOTE — Telephone Encounter (Signed)
Left a message to call to discuss this.

## 2018-12-17 ENCOUNTER — Ambulatory Visit: Payer: Medicare Other | Admitting: Gastroenterology

## 2018-12-17 NOTE — Telephone Encounter (Signed)
Spoke with the patient. She is open to discussing a colonoscopy. She saw her cardiologist very recently. Wants to know cardiology agrees with a colonoscopy. Appointment scheduled.

## 2018-12-21 ENCOUNTER — Ambulatory Visit (INDEPENDENT_AMBULATORY_CARE_PROVIDER_SITE_OTHER): Payer: Medicare Other | Admitting: Nurse Practitioner

## 2018-12-21 ENCOUNTER — Encounter: Payer: Self-pay | Admitting: Nurse Practitioner

## 2018-12-21 VITALS — BP 118/60 | HR 80 | Ht 67.25 in | Wt 235.0 lb

## 2018-12-21 DIAGNOSIS — K625 Hemorrhage of anus and rectum: Secondary | ICD-10-CM | POA: Diagnosis not present

## 2018-12-21 DIAGNOSIS — Z800 Family history of malignant neoplasm of digestive organs: Secondary | ICD-10-CM

## 2018-12-21 DIAGNOSIS — Z7901 Long term (current) use of anticoagulants: Secondary | ICD-10-CM | POA: Diagnosis not present

## 2018-12-21 MED ORDER — NA SULFATE-K SULFATE-MG SULF 17.5-3.13-1.6 GM/177ML PO SOLN: ORAL | 0 refills | Status: DC

## 2018-12-21 NOTE — Progress Notes (Signed)
Chief Complaint:    Follow-up for rectal bleeding  IMPRESSION and PLAN:    73 year old female on warfarin with intermittent painless rectal bleeding for a year.  Her father had colon cancer.  Patient has not had a colonoscopy in several years.  At her visit in January she was reluctant to hold warfarin for colonoscopy.  She apparently developed a PE while being off Coumadin at some point time.  Rectal bleeding has improved after treating empirically for hemorrhoids.  -Lengthy discussion with patient and husband about colonoscopy.  This may very well hemorrhoidal bleeding. We are agreeable to performing the colonoscopy on warfarin to exclude other causes of the bleedig.  However, this will preclude our ability to biopsy / perform polypectomy at the time of colonoscopy and a second colonoscopy OFF warfarin will likely be suggested.  -Patient is agreeable to proceeding with a colonoscopy.  She sees her Cardiologist on March 10 and will discuss risk of being off warfarin for the procedure.  For now, will schedule colonoscopy to be done late March, on warfarin.  If patient decides to hold warfarin she will call us back and we will contact Cardiology for clearance letter   HPI:     Patient is a 73 year old female known to Dr. Silverio Decamp, I saw her mid January 2020 for evaluation of rectal bleeding in setting of anticoagulation (please refer to that dictation). She has a family history of colon cancer and father . When I saw her in January rectal bleeding was said to have started the end of December but upon further questioning it had been going on for a year or more .   She reported intermittent hard stools with straining, felt certain the bleeding was from hemorrhoids. Given the bleeding and family history we discussed colonoscopy.  Patient was reluctant to holding warfarin as she developed a blood clot in her lung while holding it for something else.  I treated empirically for hemorrhoids with  plans to see her back in clinic to see how things were going and talk further about a colonoscopy  Ms. Lieutenant Diego is back with her husband.  The rectal bleeding has improved with steroid cream though she still continues to see blood on tissue and sometimes in the toilet.  Currently her stools are soft with hot beverages and Miralax. As before, she describes intermittent RLQ discomfort especially just prior to defecation.  The discomfort is usually relieved with defecation.     Review of systems:     No chest pain, no SOB, no fevers, no urinary sx   Past Medical History:  Diagnosis Date  . Atrial fibrillation ([place])   . Benign essential tremor   . Breast cancer ([place])   . Chronic renal insufficiency   . Common migraine 05/07/2015  . Depression with anxiety   . Drowsiness    Excessive daytime  . Dyslipidemia   . Fibromyalgia   . History of partial seizures   . Hypertension   . Memory disturbance   . Muscle tension headache   . Obesity   . Obstructive sleep apnea   . S/P epidural steroid injection   . Sacral contusion 07/2018  . Seizures ([place])    Partial  . Sprain of neck 06/26/2013  . Tremors of nervous system   . Vitamin B12 deficiency     Patient's surgical history, family medical history, social history, medications and allergies were all reviewed in Epic   Creatinine clearance cannot be calculated (Patient's  most recent lab result is older than the maximum 21 days allowed.)  Current Outpatient Medications  Medication Sig Dispense Refill  . acetaminophen (TYLENOL) 325 MG tablet Take 2 tablets (650 mg total) by mouth every 6 (six) hours.    . calcium carbonate (OS-CAL) 600 MG TABS tablet Take 600 mg by mouth daily with breakfast.    . diltiazem (CARDIZEM CD) 240 MG 24 hr capsule Take 1 capsule (240 mg total) by mouth daily. 90 capsule 3  . diltiazem (CARDIZEM) 30 MG tablet Take 1 tablet as needed for palpitations or heart rate greater than 110 30 tablet 6  . DULoxetine (CYMBALTA)  20 MG capsule Take 2 capsules (40 mg total) by mouth daily. 180 capsule 1  . gabapentin (NEURONTIN) 600 MG tablet Take 1 tablet (600 mg total) by mouth 3 (three) times daily. 540 tablet 0  . HYDROcodone-acetaminophen (NORCO/VICODIN) 5-325 MG tablet Take by mouth.    . lamoTRIgine (LAMICTAL) 25 MG tablet Take 1 tablet (25 mg total) by mouth 2 (two) times daily. 180 tablet 1  . polyethylene glycol (MIRALAX / GLYCOLAX) packet Take 17 g by mouth as needed.    . Vitamin D, Ergocalciferol, (DRISDOL) 1.25 MG (50000 UT) CAPS capsule 1 cap every 10 days. 12 capsule 2  . warfarin (COUMADIN) 5 MG tablet Take 1 tablet (5 mg total) by mouth daily. as directed 90 tablet 2   No current facility-administered medications for this visit.     Physical Exam:     BP 118/60 (BP Location: Left Arm, Patient Position: Sitting, Cuff Size: Normal)   Pulse 80   Ht 5' 7.25" (1.708 m) Comment: height measured without shoes  Wt 235 lb (106.6 kg)   BMI 36.53 kg/m   GENERAL:  Pleasant female in NAD PSYCH: : Cooperative, normal affect EENT:  conjunctiva pink, mucous membranes moist, neck supple without masses CARDIAC:  RRR,  no peripheral edema PULM: Normal respiratory effort, lungs CTA bilaterally, no wheezing ABDOMEN:  Nondistended, soft, nontender,  normal bowel sounds SKIN:  turgor, no lesions seen Musculoskeletal:  Normal muscle tone, normal strength NEURO: Alert and oriented x 3, no focal neurologic deficits  I spent 25 minutes of face-to-face time with the patient. Greater than 50% of the time was spent counseling and coordinating care. Questions answered  Tye Savoy , NP 12/21/2018, 2:20 PM

## 2018-12-21 NOTE — Patient Instructions (Signed)
If you are age 73 or older, your body mass index should be between 23-30. Your Body mass index is 36.53 kg/m. If this is out of the aforementioned range listed, please consider follow up with your Primary Care Provider.  If you are age 62 or younger, your body mass index should be between 19-25. Your Body mass index is 36.53 kg/m. If this is out of the aformentioned range listed, please consider follow up with your Primary Care Provider.   You have been scheduled for a colonoscopy. Please follow written instructions given to you at your visit today.  Please pick up your prep supplies at the pharmacy within the next 1-3 days. If you use inhalers (even only as needed), please bring them with you on the day of your procedure. Your physician has requested that you go to www.startemmi.com and enter the access code given to you at your visit today. This web site gives a general overview about your procedure. However, you should still follow specific instructions given to you by our office regarding your preparation for the procedure.  We have sent the following medications to your pharmacy for you to pick up at your convenience: Suprep  Patient will let us know if she can come off Coumadin for procedure after talking with cardiologist.  Thank you for choosing me and [facility].   Tye Savoy, NP

## 2018-12-25 DIAGNOSIS — M5136 Other intervertebral disc degeneration, lumbar region: Secondary | ICD-10-CM | POA: Diagnosis not present

## 2018-12-25 DIAGNOSIS — S32110D Nondisplaced Zone I fracture of sacrum, subsequent encounter for fracture with routine healing: Secondary | ICD-10-CM | POA: Diagnosis not present

## 2018-12-25 DIAGNOSIS — M533 Sacrococcygeal disorders, not elsewhere classified: Secondary | ICD-10-CM | POA: Diagnosis not present

## 2018-12-27 DIAGNOSIS — Z79899 Other long term (current) drug therapy: Secondary | ICD-10-CM | POA: Diagnosis not present

## 2018-12-31 ENCOUNTER — Telehealth: Payer: Self-pay | Admitting: *Deleted

## 2018-12-31 NOTE — Telephone Encounter (Signed)
Rx, request for Cymbalta HCL DR 20 mg cap, Take 2 capsules by mouth daily

## 2018-12-31 NOTE — Progress Notes (Signed)
Reviewed and agree with documentation and assessment and plan. K. Veena Shakema Surita , MD   

## 2019-01-01 ENCOUNTER — Ambulatory Visit (INDEPENDENT_AMBULATORY_CARE_PROVIDER_SITE_OTHER): Payer: Medicare Other | Admitting: Pharmacist

## 2019-01-01 DIAGNOSIS — Z86711 Personal history of pulmonary embolism: Secondary | ICD-10-CM | POA: Diagnosis not present

## 2019-01-01 DIAGNOSIS — I2782 Chronic pulmonary embolism: Secondary | ICD-10-CM

## 2019-01-01 DIAGNOSIS — Z7901 Long term (current) use of anticoagulants: Secondary | ICD-10-CM

## 2019-01-01 DIAGNOSIS — Z5181 Encounter for therapeutic drug level monitoring: Secondary | ICD-10-CM

## 2019-01-01 LAB — POCT INR: INR: 3.3 — AB (ref 2.0–3.0)

## 2019-01-01 NOTE — Telephone Encounter (Signed)
Patient was getting at [facility], but has no refills. Patient would Rx sent to [facility]

## 2019-01-02 ENCOUNTER — Other Ambulatory Visit: Payer: Self-pay | Admitting: Family Medicine

## 2019-01-02 DIAGNOSIS — F411 Generalized anxiety disorder: Secondary | ICD-10-CM

## 2019-01-02 DIAGNOSIS — M797 Fibromyalgia: Secondary | ICD-10-CM

## 2019-01-02 MED ORDER — DULOXETINE HCL 20 MG PO CPEP: 40.0000 mg | ORAL_CAPSULE | Freq: Every day | ORAL | 1 refills | Status: DC

## 2019-01-02 NOTE — Telephone Encounter (Signed)
Prescription for Cymbalta 20 mg to continue taking 2 capsules daily was sent to her pharmacy. Please advise patient that in the future if she needs refills it is better to contact her pharmacy. Thanks, BJ

## 2019-01-02 NOTE — Telephone Encounter (Signed)
Copied from [place] [id number]. Topic: Quick Communication - Rx Refill/Question >> Jan 02, 2019 11:39 AM Percell Belt A wrote: Medication: DULoxetine (CYMBALTA) 20 MG capsule [[id number]]   Has the patient contacted their pharmacy?yes  (Agent: If no, request that the patient contact the pharmacy for the refill.) (Agent: If yes, when and what did the pharmacy advise?)  Preferred Pharmacy (with phone number or street name):   [facility] [place] [address] [phone] (Phone)    Agent: Please be advised that RX refills may take up to 3 business days. We ask that you follow-up with your pharmacy.

## 2019-01-10 DIAGNOSIS — M533 Sacrococcygeal disorders, not elsewhere classified: Secondary | ICD-10-CM | POA: Diagnosis not present

## 2019-01-10 DIAGNOSIS — M5136 Other intervertebral disc degeneration, lumbar region: Secondary | ICD-10-CM | POA: Diagnosis not present

## 2019-01-15 ENCOUNTER — Ambulatory Visit (INDEPENDENT_AMBULATORY_CARE_PROVIDER_SITE_OTHER): Payer: Medicare Other | Admitting: Cardiology

## 2019-01-15 VITALS — BP 141/76 | HR 61 | Ht 67.0 in | Wt 233.0 lb

## 2019-01-15 DIAGNOSIS — Z01810 Encounter for preprocedural cardiovascular examination: Secondary | ICD-10-CM

## 2019-01-15 DIAGNOSIS — I10 Essential (primary) hypertension: Secondary | ICD-10-CM | POA: Diagnosis not present

## 2019-01-15 DIAGNOSIS — I480 Paroxysmal atrial fibrillation: Secondary | ICD-10-CM

## 2019-01-15 DIAGNOSIS — Z7901 Long term (current) use of anticoagulants: Secondary | ICD-10-CM | POA: Diagnosis not present
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
## 2019-01-15 NOTE — Patient Instructions (Signed)

## 2019-01-16 ENCOUNTER — Telehealth: Payer: Self-pay | Admitting: Gastroenterology

## 2019-01-16 NOTE — Telephone Encounter (Signed)
Is this patient aware you wanted her to stay on blood thinner ?

## 2019-01-16 NOTE — Telephone Encounter (Signed)
The discussion that we had in clinic is as follows:  Patient was worried about coming off Coumadin for the procedure.  Plan was to proceed with colonoscopy ON blood thinner and less patient changed her mind and wanted to have [place] get clearance from her cardiologist about holding the blood thinner.  He knew she would be seeing cardiology soon and was going to discuss this.  The question was not about clearing her for colonoscopy but rather whether she wanted to come off the Coumadin and if so, did her cardiologist consent for that.

## 2019-01-16 NOTE — Telephone Encounter (Signed)
[facility] Pre-operative Risk Assessment     Request for surgical clearance:     Endoscopy Procedure  What type of surgery is being performed?   COLONOSCOPY  When is this surgery scheduled?     3/26  What type of clearance is required ?   Pharmacy  Are there any medications that need to be held prior to surgery and how long? COUMADIN  Practice name and name of physician performing surgery?      [facility]  What is your office phone and fax number?      Phone- [phone]  [phone]  Anesthesia type (None, local, MAC, general) ?       MAC

## 2019-01-18 ENCOUNTER — Encounter: Payer: Self-pay | Admitting: Cardiology

## 2019-01-18 DIAGNOSIS — M7062 Trochanteric bursitis, left hip: Secondary | ICD-10-CM | POA: Diagnosis not present

## 2019-01-18 NOTE — Telephone Encounter (Signed)
Pt was seen by Dr. Harrell Gave this week on 01/15/19 and cleared to hold coumadin without bridge for colonoscopy. Her recomendations from office note outlined below.  3. Pre-procedural cardiovascular exam: we discussed options for management. She prefers to come off coumadin prior to colonoscopy as she has so much difficulty with the prep. She was recommended to stop coumadin 5 days prior to her procedure. -Ok to hold coumadin starting 5 days prior. Discussed pros/cons of bridging, and as she has no history of stroke, we decided not to bridge based on brief period of time she will be off anticoagulation -recommend restarting coumadin as soon as cleared by GI. If only biopsies without major bleeding, could restart with the dose due the night of her procedure (after she comes home). She will need coumadin follow up post procedure. -she has an anticoagulation clinic visit coming up next week as well.

## 2019-01-18 NOTE — Telephone Encounter (Signed)
Please see my recent note, and I also believe she has upcoming follow up with anticoag clinic. I put in my note recommendations for holding coumadin without bridging. Thanks!

## 2019-01-24 ENCOUNTER — Telehealth: Payer: Self-pay | Admitting: Gastroenterology

## 2019-01-24 ENCOUNTER — Telehealth: Payer: Self-pay

## 2019-01-24 NOTE — Telephone Encounter (Signed)
1. Have you recently travelled abroad or to [place], [place], or [place]? NO 2. Do you currently have a fever? NO 3. Have you been in contact with someone that is currently pending confirmation of COVID19 testing or has been confirmed to have the COVID19 virus? NO 4. Are you currently experiencing fatigue or cough? YES 5. Are you currently experiencing new or worsening shortness of breath at rest or with minimal activity? YES 6. Have you been in contact with someone that was recently sick with fever/cough/fatigue? NO   **A score of 4 or more should result in cancellation of the pts cardiology appt  **A score of 2 should be provided a mask prior to admission into the lobby  **TRAVEL to a high risk area or contact with a confirmed case should stay at home, away from confirmed patient, monitor symptoms, and reach out to PCP for evisit, additional testing.   **ALL PTS WITH FEVER SHOULD BE REFERRED TO PCP FOR EVISIT  Pt. Advised that we are restricting visitors at this time and request that only patients present for check-in prior to their appointment. All other visitors should remain in their car. If necessary, only one visitor may come with the patient into the building. For everyone's safety, all patients and visitors entering our practice area should expect to be screened again prior to entering our waiting area.

## 2019-01-24 NOTE — Telephone Encounter (Signed)
Pt called wanting to speak with the nurse she had some questions that she needed to ask before the procedure.

## 2019-01-24 NOTE — Telephone Encounter (Signed)
Patient asks to postpone her colonoscopy due to the present pandemic and the concerns for her own safety.  Appointment in [place] on 01/31/19 cancelled.

## 2019-01-25 ENCOUNTER — Ambulatory Visit (INDEPENDENT_AMBULATORY_CARE_PROVIDER_SITE_OTHER): Payer: Medicare Other | Admitting: *Deleted

## 2019-01-25 ENCOUNTER — Other Ambulatory Visit: Payer: Self-pay

## 2019-01-25 DIAGNOSIS — Z5181 Encounter for therapeutic drug level monitoring: Secondary | ICD-10-CM

## 2019-01-25 DIAGNOSIS — Z7901 Long term (current) use of anticoagulants: Secondary | ICD-10-CM | POA: Diagnosis not present

## 2019-01-25 DIAGNOSIS — I2782 Chronic pulmonary embolism: Secondary | ICD-10-CM

## 2019-01-25 DIAGNOSIS — Z86711 Personal history of pulmonary embolism: Secondary | ICD-10-CM | POA: Diagnosis not present

## 2019-01-25 DIAGNOSIS — Z79899 Other long term (current) drug therapy: Secondary | ICD-10-CM | POA: Diagnosis not present

## 2019-01-25 LAB — POCT INR: INR: 2.6 (ref 2.0–3.0)

## 2019-01-25 NOTE — Patient Instructions (Addendum)
  Description   Continue taking 1 tablet daily except 1/2 tablet on Sundays. Recheck INR in 4 weeks.

## 2019-01-27 NOTE — Telephone Encounter (Signed)
Agree, with rescheduling.

## 2019-01-31 ENCOUNTER — Encounter: Payer: Medicare Other | Admitting: Gastroenterology

## 2019-02-21 ENCOUNTER — Telehealth: Payer: Self-pay

## 2019-02-21 NOTE — Telephone Encounter (Signed)

## 2019-02-22 ENCOUNTER — Ambulatory Visit (INDEPENDENT_AMBULATORY_CARE_PROVIDER_SITE_OTHER): Payer: Medicare Other | Admitting: *Deleted

## 2019-02-22 DIAGNOSIS — Z5181 Encounter for therapeutic drug level monitoring: Secondary | ICD-10-CM | POA: Diagnosis not present

## 2019-02-22 DIAGNOSIS — Z86711 Personal history of pulmonary embolism: Secondary | ICD-10-CM

## 2019-02-22 DIAGNOSIS — Z7901 Long term (current) use of anticoagulants: Secondary | ICD-10-CM

## 2019-02-22 LAB — POCT INR: INR: 2.6 (ref 2.0–3.0)

## 2019-02-26 DIAGNOSIS — M79604 Pain in right leg: Secondary | ICD-10-CM | POA: Diagnosis not present

## 2019-02-26 DIAGNOSIS — G894 Chronic pain syndrome: Secondary | ICD-10-CM | POA: Diagnosis not present

## 2019-02-26 DIAGNOSIS — M79605 Pain in left leg: Secondary | ICD-10-CM | POA: Diagnosis not present

## 2019-02-26 DIAGNOSIS — Z79899 Other long term (current) drug therapy: Secondary | ICD-10-CM | POA: Diagnosis not present

## 2019-02-27 DIAGNOSIS — M7062 Trochanteric bursitis, left hip: Secondary | ICD-10-CM | POA: Diagnosis not present

## 2019-02-27 DIAGNOSIS — M7061 Trochanteric bursitis, right hip: Secondary | ICD-10-CM | POA: Diagnosis not present

## 2019-03-04 ENCOUNTER — Emergency Department (HOSPITAL_COMMUNITY): Payer: Medicare Other

## 2019-03-04 ENCOUNTER — Encounter (HOSPITAL_COMMUNITY): Payer: Self-pay

## 2019-03-04 ENCOUNTER — Observation Stay (HOSPITAL_COMMUNITY)
Admission: EM | Admit: 2019-03-04 | Discharge: 2019-03-05 | Disposition: A | Discharge status: Home / Self Care | Payer: Medicare Other | Attending: Family Medicine | Admitting: Family Medicine

## 2019-03-04 ENCOUNTER — Other Ambulatory Visit: Payer: Self-pay

## 2019-03-04 ENCOUNTER — Telehealth: Payer: Self-pay | Admitting: Cardiology

## 2019-03-04 DIAGNOSIS — Z8669 Personal history of other diseases of the nervous system and sense organs: Secondary | ICD-10-CM | POA: Diagnosis not present

## 2019-03-04 DIAGNOSIS — F419 Anxiety disorder, unspecified: Secondary | ICD-10-CM | POA: Diagnosis not present

## 2019-03-04 DIAGNOSIS — I129 Hypertensive chronic kidney disease with stage 1 through stage 4 chronic kidney disease, or unspecified chronic kidney disease: Secondary | ICD-10-CM | POA: Insufficient documentation

## 2019-03-04 DIAGNOSIS — Z853 Personal history of malignant neoplasm of breast: Secondary | ICD-10-CM | POA: Insufficient documentation

## 2019-03-04 DIAGNOSIS — Z9049 Acquired absence of other specified parts of digestive tract: Secondary | ICD-10-CM | POA: Diagnosis not present

## 2019-03-04 DIAGNOSIS — Z7901 Long term (current) use of anticoagulants: Secondary | ICD-10-CM

## 2019-03-04 DIAGNOSIS — R7989 Other specified abnormal findings of blood chemistry: Secondary | ICD-10-CM | POA: Diagnosis present

## 2019-03-04 DIAGNOSIS — R778 Other specified abnormalities of plasma proteins: Secondary | ICD-10-CM | POA: Diagnosis present

## 2019-03-04 DIAGNOSIS — I4891 Unspecified atrial fibrillation: Secondary | ICD-10-CM | POA: Diagnosis not present

## 2019-03-04 DIAGNOSIS — Z85828 Personal history of other malignant neoplasm of skin: Secondary | ICD-10-CM | POA: Insufficient documentation

## 2019-03-04 DIAGNOSIS — I480 Paroxysmal atrial fibrillation: Principal | ICD-10-CM | POA: Insufficient documentation

## 2019-03-04 DIAGNOSIS — R0600 Dyspnea, unspecified: Secondary | ICD-10-CM | POA: Diagnosis not present

## 2019-03-04 DIAGNOSIS — F329 Major depressive disorder, single episode, unspecified: Secondary | ICD-10-CM | POA: Insufficient documentation

## 2019-03-04 DIAGNOSIS — R0902 Hypoxemia: Secondary | ICD-10-CM | POA: Diagnosis not present

## 2019-03-04 DIAGNOSIS — F32A Depression, unspecified: Secondary | ICD-10-CM | POA: Diagnosis present

## 2019-03-04 DIAGNOSIS — N189 Chronic kidney disease, unspecified: Secondary | ICD-10-CM | POA: Insufficient documentation

## 2019-03-04 DIAGNOSIS — R002 Palpitations: Secondary | ICD-10-CM | POA: Diagnosis not present

## 2019-03-04 DIAGNOSIS — R079 Chest pain, unspecified: Secondary | ICD-10-CM | POA: Diagnosis not present

## 2019-03-04 DIAGNOSIS — R000 Tachycardia, unspecified: Secondary | ICD-10-CM | POA: Diagnosis not present

## 2019-03-04 HISTORY — DX: Other complications of anesthesia, initial encounter: T88.59XA

## 2019-03-04 HISTORY — DX: Other specified postprocedural states: R11.2

## 2019-03-04 HISTORY — DX: Other specified postprocedural states: Z98.890

## 2019-03-04 HISTORY — DX: Adverse effect of unspecified anesthetic, initial encounter: T41.45XA

## 2019-03-04 LAB — TROPONIN I
Troponin I: 0.03 ng/mL (ref <0.03)
Troponin I: 0.03 ng/mL (ref <0.03)
Troponin I: 0.03 ng/mL (ref <0.03)

## 2019-03-04 LAB — BASIC METABOLIC PANEL
Anion gap: 12 (ref 5–15)
BUN: 23 mg/dL (ref 8–23)
CO2: 26 mmol/L (ref 22–32)
Calcium: 9 mg/dL (ref 8.9–10.3)
Chloride: 103 mmol/L (ref 98–111)
Creatinine, Ser: 1.11 mg/dL — ABNORMAL HIGH (ref 0.44–1.00)
GFR calc Af Amer: 57 mL/min — ABNORMAL LOW (ref >60)
GFR calc non Af Amer: 50 mL/min — ABNORMAL LOW (ref >60)
Glucose, Bld: 122 mg/dL — ABNORMAL HIGH (ref 70–99)
Potassium: 3.6 mmol/L (ref 3.5–5.1)
Sodium: 141 mmol/L (ref 135–145)

## 2019-03-04 LAB — TSH: TSH: 1.867 u[IU]/mL (ref 0.350–4.500)

## 2019-03-04 LAB — PROTIME-INR
INR: 2.2 — ABNORMAL HIGH (ref 0.8–1.2)
Prothrombin Time: 24.1 seconds — ABNORMAL HIGH (ref 11.4–15.2)

## 2019-03-04 LAB — CBC
HCT: 50.3 % — ABNORMAL HIGH (ref 36.0–46.0)
Hemoglobin: 16.4 g/dL — ABNORMAL HIGH (ref 12.0–15.0)
MCH: 30.8 pg (ref 26.0–34.0)
MCHC: 32.6 g/dL (ref 30.0–36.0)
MCV: 94.4 fL (ref 80.0–100.0)
Platelets: 207 10*3/uL (ref 150–400)
RBC: 5.33 MIL/uL — ABNORMAL HIGH (ref 3.87–5.11)
RDW: 14.6 % (ref 11.5–15.5)
WBC: 11.5 10*3/uL — ABNORMAL HIGH (ref 4.0–10.5)
nRBC: 0 % (ref 0.0–0.2)

## 2019-03-04 LAB — MAGNESIUM: Magnesium: 2.2 mg/dL (ref 1.7–2.4)

## 2019-03-04 MED ORDER — SODIUM CHLORIDE 0.9% FLUSH
3.0000 mL | Freq: Once | INTRAVENOUS | Status: AC
  Administered 2019-03-04: 3 mL via INTRAVENOUS

## 2019-03-04 MED ORDER — LAMOTRIGINE 25 MG PO TABS
25.0000 mg | ORAL_TABLET | Freq: Two times a day (BID) | ORAL | Status: DC
  Administered 2019-03-04 – 2019-03-05 (×2): 25 mg via ORAL
  Filled 2019-03-04 (×3): qty 1

## 2019-03-04 MED ORDER — GABAPENTIN 300 MG PO CAPS
600.0000 mg | ORAL_CAPSULE | Freq: Three times a day (TID) | ORAL | Status: DC
  Administered 2019-03-04: 300 mg via ORAL
  Administered 2019-03-04 – 2019-03-05 (×3): 600 mg via ORAL
  Filled 2019-03-04 (×4): qty 2

## 2019-03-04 MED ORDER — DILTIAZEM HCL-DEXTROSE 100-5 MG/100ML-% IV SOLN (PREMIX)
5.0000 mg/h | INTRAVENOUS | Status: DC
  Administered 2019-03-04: 5 mg/h via INTRAVENOUS
  Filled 2019-03-04 (×2): qty 100

## 2019-03-04 MED ORDER — WARFARIN - PHARMACIST DOSING INPATIENT: Freq: Every day | Status: DC

## 2019-03-04 MED ORDER — POLYETHYLENE GLYCOL 3350 17 G PO PACK: 17.0000 g | PACK | ORAL | Status: DC | PRN

## 2019-03-04 MED ORDER — ACETAMINOPHEN 325 MG PO TABS
650.0000 mg | ORAL_TABLET | Freq: Four times a day (QID) | ORAL | Status: DC | PRN
  Administered 2019-03-04: 325 mg via ORAL
  Filled 2019-03-04: qty 2

## 2019-03-04 MED ORDER — DILTIAZEM HCL 30 MG PO TABS
30.0000 mg | ORAL_TABLET | Freq: Once | ORAL | Status: AC
  Administered 2019-03-04: 30 mg via ORAL
  Filled 2019-03-04: qty 1

## 2019-03-04 MED ORDER — DILTIAZEM HCL 25 MG/5ML IV SOLN
15.0000 mg | Freq: Once | INTRAVENOUS | Status: AC
  Administered 2019-03-04: 15 mg via INTRAVENOUS
  Filled 2019-03-04: qty 5

## 2019-03-04 MED ORDER — ACETAMINOPHEN 650 MG RE SUPP: 650.0000 mg | Freq: Four times a day (QID) | RECTAL | Status: DC | PRN

## 2019-03-04 MED ORDER — DULOXETINE HCL 20 MG PO CPEP
40.0000 mg | ORAL_CAPSULE | Freq: Every day | ORAL | Status: DC
  Administered 2019-03-05: 40 mg via ORAL
  Filled 2019-03-04: qty 2

## 2019-03-04 MED ORDER — ONDANSETRON HCL 4 MG PO TABS
4.0000 mg | ORAL_TABLET | Freq: Four times a day (QID) | ORAL | Status: DC | PRN
  Administered 2019-03-05: 4 mg via ORAL
  Filled 2019-03-04: qty 1

## 2019-03-04 MED ORDER — POTASSIUM CHLORIDE CRYS ER 20 MEQ PO TBCR
40.0000 meq | EXTENDED_RELEASE_TABLET | ORAL | Status: AC
  Administered 2019-03-04: 40 meq via ORAL
  Filled 2019-03-04: qty 2

## 2019-03-04 MED ORDER — DILTIAZEM LOAD VIA INFUSION
15.0000 mg | Freq: Once | INTRAVENOUS | Status: DC
  Filled 2019-03-04: qty 15

## 2019-03-04 MED ORDER — HYDROCODONE-ACETAMINOPHEN 5-325 MG PO TABS
1.0000 | ORAL_TABLET | Freq: Three times a day (TID) | ORAL | Status: DC | PRN
  Administered 2019-03-04 – 2019-03-05 (×2): 1 via ORAL
  Filled 2019-03-04 (×2): qty 1

## 2019-03-04 MED ORDER — ONDANSETRON HCL 4 MG/2ML IJ SOLN: 4.0000 mg | Freq: Four times a day (QID) | INTRAMUSCULAR | Status: DC | PRN

## 2019-03-04 MED ORDER — WARFARIN SODIUM 5 MG PO TABS
5.0000 mg | ORAL_TABLET | Freq: Once | ORAL | Status: AC
  Administered 2019-03-04: 5 mg via ORAL
  Filled 2019-03-04 (×2): qty 1

## 2019-03-04 MED ORDER — ENSURE ENLIVE PO LIQD: 237.0000 mL | Freq: Two times a day (BID) | ORAL | Status: DC

## 2019-03-04 MED ORDER — ALBUTEROL SULFATE (2.5 MG/3ML) 0.083% IN NEBU: 2.5000 mg | INHALATION_SOLUTION | Freq: Four times a day (QID) | RESPIRATORY_TRACT | Status: DC | PRN

## 2019-03-04 NOTE — Progress Notes (Signed)
[place] for warfarin Indication: atrial fibrillation   Assessment: 47 yof with hx of afib on warfarin PTA, presenting with afib with RVR. Pharmacy consulted to dose warfarin inpatient. INR therapeutic at 2.2 on admit. CBC wnl. No active bleed issues documented.  PTA warfarin dose: 5mg  daily except 2.5mg  on Sunday (last dose 4/26 PTA)  Goal of Therapy:  INR 2-3 Monitor platelets by anticoagulation protocol: Yes   Plan:  Warfarin 5mg  PO x 1 dose tonight Daily INR Monitor CBC, s/sx bleeding F/u Cardiology plans  Elicia Lamp, PharmD, BCPS Clinical Pharmacist 03/04/2019 3:29 PM

## 2019-03-04 NOTE — Telephone Encounter (Signed)
Pt called to report that she has been experiencing increased "pounding"  In her chest over the past 3 days.. she says that she is having chest pain and increased SOB with it.. she says she needs "immediate attention" because she does not feel well and usually it does not last this long.. she cannot get a HR due to her AFIB and pulse OX is reading 90-93. She says that this is the worse than it has ever been.   I asked her if she would agree that EMS would be a good idea since she feels this bad and sounds very anxious and upset over the phone and she agreed and said she almost called them yesterday. I urged her to go ahead and call and I will froward to Dr. Harrell Gave for her review.

## 2019-03-04 NOTE — Telephone Encounter (Signed)
Thank you. I agree that emergency medical care is appropriate.

## 2019-03-04 NOTE — ED Notes (Signed)
ED TO INPATIENT HANDOFF REPORT  ED Nurse Name and Phone #: Davene Costain [phone]  S Name/Age/Gender Debbie [name] 73 y.o. female Room/Bed: 035C/035C  Code Status   Code Status: Full Code  Home/SNF/Other Home Patient oriented to: self, place, time and situation Is this baseline? Yes   Triage Complete: Triage complete  Chief Complaint afib  Triage Note Pt from home via ems; c/o cp and sob; hx Afib; states she can feel she is in Afib; says normally she takes her Afib medicine and symptoms resolve, but today is the 4th day of symptoms; called cardiologist, was told to come here for evaluation; denies sick contacts  HR 110-120 150/70 RR 20 95% [name] 98.74F    Allergies Allergies  Allergen Reactions  . Diclofenac Shortness Of Breath  . Atorvastatin Other (See Comments)    Muscle pain  . Tape Other (See Comments)    Peels skin off (bandaids, too) unknown  . Mushroom Extract Complex Nausea And Vomiting  . Tramadol Other (See Comments) and Nausea And Vomiting  . Buprenorphine Hcl Nausea And Vomiting  . Codeine Nausea And Vomiting  . Fish Oil Nausea And Vomiting  . Morphine And Related Nausea And Vomiting    Level of Care/Admitting Diagnosis ED Disposition    ED Disposition Condition [facility]: [place] [[id number]]  Level of Care: Progressive [102]  I expect the patient will be discharged within 24 hours: No (not a candidate for 5C-Observation unit)  Covid Evaluation: N/A  Diagnosis: Atrial fibrillation with RVR [facility]) [[id number]]  Admitting Physician: Norval Morton [[id number]]  Attending Physician: Norval [place] [[id number]]  PT Class (Do Not Modify): Observation [104]  PT Acc Code (Do Not Modify): Observation [10022]       B Medical/Surgery History Past Medical History:  Diagnosis Date  . Atrial fibrillation ([place])   . Benign essential tremor   . Breast cancer ([place])   . Chronic renal insufficiency   . Common migraine 05/07/2015   . Depression with anxiety   . Drowsiness    Excessive daytime  . Dyslipidemia   . Fibromyalgia   . History of partial seizures   . Hypertension   . Memory disturbance   . Muscle tension headache   . Obesity   . Obstructive sleep apnea   . S/P epidural steroid injection   . Sacral contusion 07/2018  . Seizures ([place])    Partial  . Sprain of neck 06/26/2013  . Tremors of nervous system   . Vitamin B12 deficiency    Past Surgical History:  Procedure Laterality Date  . ABDOMINAL HYSTERECTOMY    . ablation procedure     Cardiac, for Afib  . BASAL CELL CARCINOMA EXCISION    . BASAL CELL CARCINOMA EXCISION    . BRAIN MENINGIOMA EXCISION Right   . BREAST RECONSTRUCTION Bilateral   . breast reconstructive    . CATARACT EXTRACTION, BILATERAL    . CHOLECYSTECTOMY    . DILATION AND CURETTAGE OF UTERUS    . dilution    . ESOPHAGEAL MANOMETRY N/A 12/14/2016   Procedure: ESOPHAGEAL MANOMETRY (EM);  Surgeon: Mauri Pole, MD;  Location: [facility] ENDOSCOPY;  Service: Endoscopy;  Laterality: N/A;  . gallbladder resection    . LUMBAR SPINE SURGERY N/A   . lumbosacral    . mastectomies Bilateral   . NOSE SURGERY     Basal cell carcinoma resection from the nose  . TUBAL LIGATION N/A  A IV Location/Drains/Wounds Patient Lines/Drains/Airways Status   Active Line/Drains/Airways    Name:   Placement date:   Placement time:   Site:   Days:   Peripheral IV 03/04/19 Left Antecubital   03/04/19    -    Antecubital   less than 1          Intake/Output Last 24 hours No intake or output data in the 24 hours ending 03/04/19 1530  Labs/Imaging Results for orders placed or performed during the hospital encounter of 03/04/19 (from the past 48 hour(s))  Basic metabolic panel     Status: Abnormal   Collection Time: 03/04/19 11:39 AM  Result Value Ref Range   Sodium 141 135 - 145 mmol/L   Potassium 3.6 3.5 - 5.1 mmol/L   Chloride 103 98 - 111 mmol/L   CO2 26 22 - 32 mmol/L   Glucose,  Bld 122 (H) 70 - 99 mg/dL   BUN 23 8 - 23 mg/dL   Creatinine, Ser 1.11 (H) 0.44 - 1.00 mg/dL   Calcium 9.0 8.9 - 10.3 mg/dL   GFR calc non Af Amer 50 (L) >60 mL/min   GFR calc Af Amer 57 (L) >60 mL/min   Anion gap 12 5 - 15    Comment: Performed at [facility], [place] [address] [place], [place] [address]  CBC     Status: Abnormal   Collection Time: 03/04/19 11:39 AM  Result Value Ref Range   WBC 11.5 (H) 4.0 - 10.5 K/uL   RBC 5.33 (H) 3.87 - 5.11 MIL/uL   Hemoglobin 16.4 (H) 12.0 - 15.0 g/dL   HCT 50.3 (H) 36.0 - 46.0 %   MCV 94.4 80.0 - 100.0 fL   MCH 30.8 26.0 - 34.0 pg   MCHC 32.6 30.0 - 36.0 g/dL   RDW 14.6 11.5 - 15.5 %   Platelets 207 150 - 400 K/uL   nRBC 0.0 0.0 - 0.2 %    Comment: Performed at [facility], [place] [address] [place], [place] [address]  Protime-INR- (order if Patient is taking Coumadin / Warfarin)     Status: Abnormal   Collection Time: 03/04/19 11:39 AM  Result Value Ref Range   Prothrombin Time 24.1 (H) 11.4 - 15.2 seconds   INR 2.2 (H) 0.8 - 1.2    Comment: (NOTE) INR goal varies based on device and disease states. Performed at [facility], [place] [address] [place], [place] [address]   Troponin I - Once     Status: Abnormal   Collection Time: 03/04/19 11:39 AM  Result Value Ref Range   Troponin I 0.03 (HH) <0.03 ng/mL    Comment: CRITICAL RESULT CALLED TO, READ BACK BY AND VERIFIED WITHBernette Mayers RN [id number] BY A BENNETT Performed at [facility], [place] [address], [place], [place] [address]    Dg Chest 2 View  Result Date: 03/04/2019 CLINICAL DATA:  Chest pain EXAM: CHEST - 2 VIEW COMPARISON:  06/15/2018 FINDINGS: The heart size and mediastinal contours are within normal limits. Both lungs are clear. The visualized skeletal structures are unremarkable. IMPRESSION: No active cardiopulmonary disease. Electronically Signed   By: Kathreen Devoid   On: 03/04/2019 12:14    Pending Labs Unresulted Labs (From admission,  onward)    Start     Ordered   03/05/19 4403  Basic metabolic panel  Tomorrow morning,   R     03/04/19 1524   03/05/19 0500  CBC  Tomorrow morning,   R     03/04/19 1524   03/04/19 1525  Troponin I - Now Then Q6H  Now then every 6 hours,   R     03/04/19 1524   03/04/19 1520  Magnesium  Once,   R     03/04/19 1524   03/04/19 1520  TSH  Once,   R     03/04/19 1524          Vitals/Pain Today's Vitals   03/04/19 1503 03/04/19 1506 03/04/19 1513 03/04/19 1515  BP:    122/80  Pulse: 86 91 78 74  Resp: 20 18 20 18   Temp:      TempSrc:      SpO2: 96% 94% 94% 94%  Weight:      Height:      PainSc:        Isolation Precautions No active isolations  Medications Medications  diltiazem (CARDIZEM) 100 mg in dextrose 5% 193mL (1 mg/mL) infusion (7.5 mg/hr Intravenous Rate/Dose Change 03/04/19 1515)  HYDROcodone-acetaminophen (NORCO/VICODIN) 5-325 MG per tablet 1 tablet (has no administration in time range)  DULoxetine (CYMBALTA) DR capsule 40 mg (has no administration in time range)  polyethylene glycol (MIRALAX / GLYCOLAX) packet 17 g (has no administration in time range)  lamoTRIgine (LAMICTAL) tablet 25 mg (has no administration in time range)  gabapentin (NEURONTIN) capsule 600 mg (has no administration in time range)  ondansetron (ZOFRAN) tablet 4 mg (has no administration in time range)    Or  ondansetron (ZOFRAN) injection 4 mg (has no administration in time range)  acetaminophen (TYLENOL) tablet 650 mg (has no administration in time range)    Or  acetaminophen (TYLENOL) suppository 650 mg (has no administration in time range)  albuterol (PROVENTIL) (2.5 MG/3ML) 0.083% nebulizer solution 2.5 mg (has no administration in time range)  sodium chloride flush (NS) 0.9 % injection 3 mL (3 mLs Intravenous Given 03/04/19 1146)  diltiazem (CARDIZEM) injection 15 mg (15 mg Intravenous Given 03/04/19 1219)  diltiazem (CARDIZEM) tablet 30 mg (30 mg Oral Given 03/04/19 1321)     Mobility walks     Focused Assessments Cardiac Assessment Handoff:  Cardiac Rhythm: Atrial fibrillation Lab Results  Component Value Date   TROPONINI 0.03 ([place]) 03/04/2019   No results found for: DDIMER Does the Patient currently have chest pain? No     R Recommendations: See Admitting Provider Note  Report given to:   Additional Notes:

## 2019-03-04 NOTE — ED Provider Notes (Signed)
Emergency Department Provider Note   I have reviewed the triage vital signs and the nursing notes.   HISTORY  Chief Complaint Atrial Fibrillation   HPI Debbie [name] is a 73 y.o. female with PMH of A-fib on Coumadin, HLD, and seizure disorder [place] to the emergency department with constant A. fib sensation over the past 4 days.  She has associated left-sided chest tightness/pressure.  She reports some shortness of breath.  She has tried taking her home diltiazem including an extra dose this morning of 30 mg without improvement in symptoms.  She called her cardiologist this morning who referred her to the emergency department. Denies any fever or chills. No radiation of symptoms or modifying factors.    Past Medical History:  Diagnosis Date  . Atrial fibrillation ([place])   . Benign essential tremor   . Breast cancer ([place])   . Chronic renal insufficiency   . Common migraine 05/07/2015  . Depression with anxiety   . Drowsiness    Excessive daytime  . Dyslipidemia   . Fibromyalgia   . History of partial seizures   . Hypertension   . Memory disturbance   . Muscle tension headache   . Obesity   . Obstructive sleep apnea   . S/P epidural steroid injection   . Sacral contusion 07/2018  . Seizures ([place])    Partial  . Sprain of neck 06/26/2013  . Tremors of nervous system   . Vitamin B12 deficiency     Patient Active Problem List   Diagnosis Date Noted  . Vitamin D deficiency, unspecified 09/12/2018  . Sacral fracture, closed ([place]) 07/11/2018  . Fall   . Pain   . Palpitations 04/20/2018  . Encounter for therapeutic drug monitoring 10/24/2017  . History of pulmonary embolism 10/20/2017  . Chronic lower back pain 09/08/2017  . Arthritis 06/19/2017  . Chest tightness or pressure 06/19/2017  . Claustrophobia 06/19/2017  . Diverticulitis 06/19/2017  . Headache 06/19/2017  . Heartburn 06/19/2017  . PONV (postoperative nausea and vomiting) 06/19/2017  . Nausea without  vomiting 05/08/2017  . Generalized anxiety disorder 03/07/2017  . Varicose veins of both lower extremities 02/11/2017  . Atypical chest pain 02/11/2017  . Constipation 01/06/2017  . Dysphagia   . Restrictive lung disease 12/16/2016  . Generalized osteoarthritis of multiple sites 12/09/2016  . SOB (shortness of breath) 11/18/2016  . Chronic kidney disease 11/18/2016  . 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 Other pulmonary embolism without acute cor pulmonale ([place]) 09/05/2016  . Pericardial effusion 08/29/2016  . S/P ablation of atrial fibrillation 04/13/2016  . Chronic arthralgias of knees and hips 02/26/2016  . Thoracic radiculopathy 02/26/2016  . Anxiety 01/29/2016  . Depression 01/29/2016  . Esophageal reflux 01/29/2016  . Hyperlipidemia 01/29/2016  . Osteoarthritis 01/29/2016  . Osteopenia 01/29/2016  . Other intervertebral disc displacement, lumbar region 01/29/2016  . Tremor 01/29/2016  . 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 Spondylolisthesis of lumbar region 01/29/2016  . Seizure, partial ([place]) 01/29/2016  . Pure hypertriglyceridemia 01/29/2016  . Osteoarthritis of right foot 01/18/2016  . Fibromyalgia 11/13/2015  . Anticoagulant long-term use 10/28/2015  . Benign hypertension with CKD (chronic kidney disease), stage II 10/28/2015  . Mixed hyperlipidemia 10/28/2015  . Need for influenza vaccination 10/28/2015  . Recurrent major depressive disorder, in partial remission ([place]) 10/28/2015  . Chronic kidney disease, stage 3 ([place]) 07/29/2015  . Essential hypertension 05/12/2015  . Fatigue 05/12/2015  . Long term current use  of anticoagulant 05/12/2015  . Common migraine 05/07/2015  . Sprain of neck 06/26/2013  . Sleep apnea 02/04/2013  . Paroxysmal atrial fibrillation ([place]) 02/04/2013  . Other vitamin B12 deficiency anemia 02/04/2013  . Memory loss 02/04/2013  . Chronic back pain 02/04/2013  . Seizures ([place]) 11/08/2011   . Cancer ([place]) 11/07/1982    Past Surgical History:  Procedure Laterality Date  . ABDOMINAL HYSTERECTOMY    . ablation procedure     Cardiac, for Afib  . BASAL CELL CARCINOMA EXCISION    . BASAL CELL CARCINOMA EXCISION    . BRAIN MENINGIOMA EXCISION Right   . BREAST RECONSTRUCTION Bilateral   . breast reconstructive    . CATARACT EXTRACTION, BILATERAL    . CHOLECYSTECTOMY    . DILATION AND CURETTAGE OF UTERUS    . dilution    . ESOPHAGEAL MANOMETRY N/A 12/14/2016   Procedure: ESOPHAGEAL MANOMETRY (EM);  Surgeon: Mauri Pole, MD;  Location: [facility] ENDOSCOPY;  Service: Endoscopy;  Laterality: N/A;  . gallbladder resection    . LUMBAR SPINE SURGERY N/A   . lumbosacral    . mastectomies Bilateral   . NOSE SURGERY     Basal cell carcinoma resection from the nose  . TUBAL LIGATION N/A     Allergies Diclofenac; Atorvastatin; Tape; Mushroom extract complex; Tramadol; Buprenorphine hcl; Codeine; Fish oil; and Morphine and related  Family History  Problem Relation Age of Onset  . Heart failure Mother   . Diabetes Mother   . Arthritis Mother   . Hyperlipidemia Mother   . Heart disease Mother   . Stroke Mother   . Hypertension Mother   . Cancer - Colon Father   . Colon cancer Father        late 56's  . Arthritis Father   . Cancer Father   . Breast cancer Sister   . Diabetes Maternal Grandfather   . Heart failure Maternal Grandfather   . Stroke Maternal Grandmother   . Lung disease Neg Hx     Social History Social History   Tobacco Use  . Smoking status: Never Smoker  . Smokeless tobacco: Never Used  Substance Use Topics  . Alcohol use: No  . Drug use: No    Review of Systems  Constitutional: No fever/chills Eyes: No visual changes. ENT: No sore throat. Cardiovascular: Positive chest pain and heart palpitations.  Respiratory: Denies shortness of breath. Gastrointestinal: No abdominal pain.  No nausea, no vomiting.  No diarrhea.  No constipation.  Genitourinary: Negative for dysuria. Musculoskeletal: Negative for back pain. Skin: Negative for rash. Neurological: Negative for headaches, focal weakness or numbness.  10-point ROS otherwise negative.  ____________________________________________   PHYSICAL EXAM:  VITAL SIGNS: ED Triage Vitals  Enc Vitals Group     BP 03/04/19 1136 112/60     Pulse Rate 03/04/19 1137 63     Resp 03/04/19 1137 (!) 21     Temp 03/04/19 1137 97.8 F (36.6 C)     Temp Source 03/04/19 1137 Oral     SpO2 03/04/19 1136 98 %     Weight 03/04/19 1136 223 lb (101.2 kg)     Height 03/04/19 1136 5\' 8"  (1.727 m)     Pain Score 03/04/19 1136 6   Constitutional: Alert and oriented. Well appearing and in no acute distress. Eyes: Conjunctivae are normal.  Head: Atraumatic. Nose: No congestion/rhinnorhea. Mouth/Throat: Mucous membranes are moist.   Neck: No stridor.  Cardiovascular: A-fib with RVR. Good peripheral circulation. Grossly normal  heart sounds.   Respiratory: Normal respiratory effort.  No retractions. Lungs CTAB.  Gastrointestinal: Soft and nontender. No distention.  Musculoskeletal: No lower extremity tenderness nor edema. No gross deformities of extremities. Neurologic:  Normal speech and language. No gross focal neurologic deficits are appreciated.  Skin:  Skin is warm, dry and intact. No rash noted.   ____________________________________________   LABS (all labs ordered are listed, but only abnormal results are displayed)  Labs Reviewed  BASIC METABOLIC PANEL - Abnormal; Notable for the following components:      Result Value   Glucose, Bld 122 (*)    Creatinine, Ser 1.11 (*)    GFR calc non Af Amer 50 (*)    GFR calc Af Amer 57 (*)    All other components within normal limits  CBC - Abnormal; Notable for the following components:   WBC 11.5 (*)    RBC 5.33 (*)    Hemoglobin 16.4 (*)    HCT 50.3 (*)    All other components within normal limits  PROTIME-INR - Abnormal;  Notable for the following components:   Prothrombin Time 24.1 (*)    INR 2.2 (*)    All other components within normal limits  TROPONIN I - Abnormal; Notable for the following components:   Troponin I 0.03 (*)    All other components within normal limits   ____________________________________________  EKG   EKG Interpretation  Date/Time:  Monday March 04 2019 11:37:16 EDT Ventricular Rate:  143 PR Interval:    QRS Duration: 82 QT Interval:  318 QTC Calculation: 491 R Axis:   -32 Text Interpretation:  Atrial fibrillation Left axis deviation Abnormal R-wave progression, late transition Minimal ST depression, inferior leads Borderline prolonged QT interval No STEMI.  Confirmed by Nanda Quinton [phone]) on 03/04/2019 11:40:08 AM       ____________________________________________  RADIOLOGY  Dg Chest 2 View  Result Date: 03/04/2019 CLINICAL DATA:  Chest pain EXAM: CHEST - 2 VIEW COMPARISON:  06/15/2018 FINDINGS: The heart size and mediastinal contours are within normal limits. Both lungs are clear. The visualized skeletal structures are unremarkable. IMPRESSION: No active cardiopulmonary disease. Electronically Signed   By: Kathreen Devoid   On: 03/04/2019 12:14    ____________________________________________   PROCEDURES  Procedure(s) performed:   .Critical Care Performed by: Margette Fast, MD Authorized by: Margette Fast, MD   Critical care provider statement:    Critical care time (minutes):  45   Critical care time was exclusive of:  Teaching time and separately billable procedures and treating other patients   Critical care was necessary to treat or prevent imminent or life-threatening deterioration of the following conditions:  Circulatory failure   Critical care was time spent personally by me on the following activities:  Blood draw for specimens, development of treatment plan with patient or surrogate, discussions with consultants, evaluation of patient's response to  treatment, examination of patient, ordering and performing treatments and interventions, ordering and review of laboratory studies, ordering and review of radiographic studies, pulse oximetry, re-evaluation of patient's condition and review of old charts   I assumed direction of critical care for this patient from another provider in my specialty: no      ____________________________________________   INITIAL IMPRESSION / [place] / ED COURSE  Pertinent labs & imaging results that were available during my care of the patient were reviewed by me and considered in my medical decision making (see chart for details).   Presents to the  emergency department with atrial fibrillation with RVR.  No hypotension.  No fevers.  Patient on Coumadin.  Plan for labs including INR, chest x-ray, discussion with cardiology. Will rate control initially.   Spoke with Dr. Beatrix Shipper with Cardiology. No Cardioversion at this time. A-fib clinic will need to follow INR weekly and cardiovert as an outpatient. Patient HR improved on very low Diltiazem infusion dose. Plan for PO Dilt 30 mg and try to wean infusion. Patient with Troponin of 0.03 likely rate related so will need trending. CXR reviewed along with other labs.   02:45 PM  Patient unable to be weaned from the drip in the ED. Labs reviewed. Updated Cardiology, Dr. Harrell Gave. They can be available tomorrow   Discussed patient's case with Hospitalist to request admission. Patient and family (if present) updated with plan. Care transferred to Hospitalist service.  I reviewed all nursing notes, vitals, pertinent old records, EKGs, labs, imaging (as available).  ____________________________________________  FINAL CLINICAL IMPRESSION(S) / ED DIAGNOSES  Final diagnoses:  Atrial fibrillation with RVR ([place])     MEDICATIONS GIVEN DURING THIS VISIT:  Medications  diltiazem (CARDIZEM) 100 mg in dextrose 5% 133mL (1 mg/mL) infusion (10 mg/hr  Intravenous Rate/Dose Change 03/04/19 1436)  sodium chloride flush (NS) 0.9 % injection 3 mL (3 mLs Intravenous Given 03/04/19 1146)  diltiazem (CARDIZEM) injection 15 mg (15 mg Intravenous Given 03/04/19 1219)  diltiazem (CARDIZEM) tablet 30 mg (30 mg Oral Given 03/04/19 1321)     Note:  This document was prepared using Dragon voice recognition software and may include unintentional dictation errors.  Nanda Quinton, MD Emergency Medicine    Long, [facility], MD 03/04/19 2008

## 2019-03-04 NOTE — Telephone Encounter (Signed)
New message   Patient c/o Palpitations:  High priority if patient c/o lightheadedness, shortness of breath, or chest pain  1) How long have you had palpitations/irregular HR/ Afib? Are you having the symptoms now? Patient has had since February 28, 2019, yes   2) Are you currently experiencing lightheadedness, SOB or CP? Sob and chest pain  3) Do you have a history of afib (atrial fibrillation) or irregular heart rhythm? Yes   4) Have you checked your BP or HR? (document readings if available):112/84 b/p 69 hr   5) Are you experiencing any other symptoms?weakness, pounding in chest

## 2019-03-04 NOTE — ED Triage Notes (Signed)
Pt from home via ems; c/o cp and sob; hx Afib; states she can feel she is in Afib; says normally she takes her Afib medicine and symptoms resolve, but today is the 4th day of symptoms; called cardiologist, was told to come here for evaluation; denies sick contacts  HR 110-120 150/70 RR 20 95% RA 98.75F

## 2019-03-04 NOTE — ED Notes (Signed)
Attempted report 

## 2019-03-04 NOTE — H&P (Signed)
History and Physical    META KROENKE DXA:[id number] DOB: August 06, 1946 DOA: 03/04/2019  Referring MD/NP/PA: Dr. Nanda Quinton PCP: [name], [name], MD  Patient coming from: Home via EMS  Chief Complaint: Pounding in my chest  I have personally briefly reviewed patient's old medical records in [place]   HPI: Debbie [name] is a 73 y.o. female with medical history significant of HTN, paroxysmal A. fib on Coumadin followed by Dr. Harrell Gave, seizure disorder, and anxiety/depression; who presents with complaints of 4 days of increasing pounding in her chest.  Reports that she knew that she was in atrial fibrillation and had tried taking extra doses of diltiazem for last couple of days without relief.  Notes associated symptoms of shortness of breath, generalized malaise, chest discomfort on the left side, poor appetite, and weight loss of approximately 7 pounds over this week.  At home patient was checking her pulse oximetry which read anywhere from 90 to 93%.  On her way to the hospital she notes having some mild nausea due to the ride.  Denies having any significant fever, chills, significant cough, or dysuria.   ED Course: On admission at an emergency department patient was seen to be in atrial fibrillation with RVR with heart rates into the 160s.  Patient INR noted to be 2.2.  Despite additional doses of IV and oral diltiazem the heart rates remain uncontrolled, and was switched to a diltiazem drip.  Dr. Harrell Gave of cardiology was notified, but recommended not cardioverting at this time.  TRH called to admit.  Cardiology to be formally admitted if needed in a.m.  Review of Systems  Constitutional: Positive for malaise/fatigue and weight loss. Negative for fever.  HENT: Negative for congestion and nosebleeds.   Eyes: Negative for blurred vision and double vision.  Respiratory: Positive for shortness of breath. Negative for cough and sputum production.   Cardiovascular: Positive  for chest pain and palpitations. Negative for leg swelling.  Gastrointestinal: Positive for nausea. Negative for diarrhea.  Genitourinary: Negative for dysuria and hematuria.  Musculoskeletal: Negative for falls and joint pain.  Skin: Negative for rash.  Neurological: Negative for focal weakness and loss of consciousness.  Psychiatric/Behavioral: Negative for substance abuse and suicidal ideas.    Past Medical History:  Diagnosis Date  . Atrial fibrillation ([place])   . Benign essential tremor   . Breast cancer ([place])   . Chronic renal insufficiency   . Common migraine 05/07/2015  . Depression with anxiety   . Drowsiness    Excessive daytime  . Dyslipidemia   . Fibromyalgia   . History of partial seizures   . Hypertension   . Memory disturbance   . Muscle tension headache   . Obesity   . Obstructive sleep apnea   . S/P epidural steroid injection   . Sacral contusion 07/2018  . Seizures ([place])    Partial  . Sprain of neck 06/26/2013  . Tremors of nervous system   . Vitamin B12 deficiency     Past Surgical History:  Procedure Laterality Date  . ABDOMINAL HYSTERECTOMY    . ablation procedure     Cardiac, for Afib  . BASAL CELL CARCINOMA EXCISION    . BASAL CELL CARCINOMA EXCISION    . BRAIN MENINGIOMA EXCISION Right   . BREAST RECONSTRUCTION Bilateral   . breast reconstructive    . CATARACT EXTRACTION, BILATERAL    . CHOLECYSTECTOMY    . DILATION AND CURETTAGE OF UTERUS    . dilution    .  ESOPHAGEAL MANOMETRY N/A 12/14/2016   Procedure: ESOPHAGEAL MANOMETRY (EM);  Surgeon: Mauri Pole, MD;  Location: [facility];  Service: Endoscopy;  Laterality: N/A;  . gallbladder resection    . LUMBAR SPINE SURGERY N/A   . lumbosacral    . mastectomies Bilateral   . NOSE SURGERY     Basal cell carcinoma resection from the nose  . TUBAL LIGATION N/A      reports that she has never smoked. She has never used smokeless tobacco. She reports that she does not drink alcohol or  use drugs.  Allergies  Allergen Reactions  . Diclofenac Shortness Of Breath  . Atorvastatin Other (See Comments)    Muscle pain  . Tape Other (See Comments)    Peels skin off (bandaids, too) unknown  . Mushroom Extract Complex Nausea And Vomiting  . Tramadol Other (See Comments) and Nausea And Vomiting  . Buprenorphine Hcl Nausea And Vomiting  . Codeine Nausea And Vomiting  . Fish Oil Nausea And Vomiting  . Morphine And Related Nausea And Vomiting    Family History  Problem Relation Age of Onset  . Heart failure Mother   . Diabetes Mother   . Arthritis Mother   . Hyperlipidemia Mother   . Heart disease Mother   . Stroke Mother   . Hypertension Mother   . Cancer - Colon Father   . Colon cancer Father        late 53's  . Arthritis Father   . Cancer Father   . Breast cancer Sister   . Diabetes Maternal Grandfather   . Heart failure Maternal Grandfather   . Stroke Maternal Grandmother   . Lung disease Neg Hx     Prior to Admission medications   Medication Sig Start Date End Date Taking? Authorizing Provider  acetaminophen (TYLENOL) 325 MG tablet Take 2 tablets (650 mg total) by mouth every 6 (six) hours. 07/12/18  Yes Lady Deutscher, MD  calcium carbonate (OS-CAL) 600 MG TABS tablet Take 600 mg by mouth daily with breakfast.   Yes [provider]  diltiazem (CARDIZEM CD) 240 MG 24 hr capsule Take 1 capsule (240 mg total) by mouth daily. 12/04/18 11/29/19 Yes Buford Dresser, MD  diltiazem (CARDIZEM) 30 MG tablet Take 1 tablet as needed for palpitations or heart rate greater than 110 09/03/18  Yes Buford Dresser, MD  DULoxetine (CYMBALTA) 20 MG capsule Take 2 capsules (40 mg total) by mouth daily. 01/02/19  Yes [name], [name], MD  gabapentin (NEURONTIN) 600 MG tablet Take 1 tablet (600 mg total) by mouth 3 (three) times daily. 06/18/18  Yes Kathrynn Ducking, MD  HYDROcodone-acetaminophen (NORCO/VICODIN) 5-325 MG tablet Take 1 tablet by mouth every 8  (eight) hours as needed for moderate pain.  03/07/17  Yes [provider]  lamoTRIgine (LAMICTAL) 25 MG tablet Take 1 tablet (25 mg total) by mouth 2 (two) times daily. 04/30/18  Yes Kathrynn Ducking, MD  polyethylene glycol [facility] / Floria Raveling) packet Take 17 g by mouth as needed.   Yes [provider]  Vitamin D, Ergocalciferol, (DRISDOL) 1.25 MG (50000 UT) CAPS capsule 1 cap every 10 days. 11/08/18  Yes [name], [name], MD  warfarin (COUMADIN) 5 MG tablet Take 1 tablet (5 mg total) by mouth daily. as directed Patient taking differently: Take 5 mg by mouth daily. as directed. Taking 5mg  daily except on Sunday 2.5mg  12/04/18  Yes Buford Dresser, MD  Na Sulfate-K Sulfate-Mg Sulf 17.5-3.13-1.6 GM/177ML SOLN Suprep-Use as  directed 12/21/18   Willia Craze, NP  rivaroxaban (XARELTO) 20 MG TABS tablet Take 1 tablet (20 mg total) by mouth daily with supper. 12/22/16 10/19/17  End, Harrell Gave, MD    Physical Exam:  Constitutional: Elderly obese female in no acute distress and able to follow commands. Vitals:   03/04/19 1500 03/04/19 1503 03/04/19 1506 03/04/19 1513  BP: 131/75     Pulse:  86 91 78  Resp: 19 20 18 20   Temp:      TempSrc:      SpO2:  96% 94% 94%  Weight:      Height:       Eyes: PERRL, lids and conjunctivae normal ENMT: Mucous membranes are moist. Posterior pharynx clear of any exudate or lesions.Normal dentition.  Neck: normal, supple, no masses, no thyromegaly Respiratory: Clear to auscultation bilaterally no significant wheezes rhonchi appreciated.  Patient maintaining O2 saturations Cardiovascular: Regular rate and rhythm, no murmurs / rubs / gallops. No extremity edema. 2+ pedal pulses. No carotid bruits.  Abdomen: no tenderness, no masses palpated. No hepatosplenomegaly. Bowel sounds positive.  Musculoskeletal: no clubbing / cyanosis. No joint deformity upper and lower extremities. Good ROM, no contractures. Normal muscle tone.  Skin: no rashes,  lesions, ulcers. No induration Neurologic: CN 2-12 grossly intact. Sensation intact, DTR normal. Strength 5/5 in all 4.  Psychiatric: Normal judgment and insight. Alert and oriented x 3.  Anxious mood.     Labs on Admission: I have personally reviewed following labs and imaging studies  CBC: Recent Labs  Lab 03/04/19 1139  WBC 11.5*  HGB 16.4*  HCT 50.3*  MCV 94.4  PLT 017   Basic Metabolic Panel: Recent Labs  Lab 03/04/19 1139  NA 141  K 3.6  CL 103  CO2 26  GLUCOSE 122*  BUN 23  CREATININE 1.11*  CALCIUM 9.0   GFR: Estimated Creatinine Clearance: 57 mL/min (A) (by C-G formula based on SCr of 1.11 mg/dL (H)). Liver Function Tests: No results for input(s): AST, ALT, ALKPHOS, BILITOT, PROT, ALBUMIN in the last 168 hours. No results for input(s): LIPASE, AMYLASE in the last 168 hours. No results for input(s): AMMONIA in the last 168 hours. Coagulation Profile: Recent Labs  Lab 03/04/19 1139  INR 2.2*   Cardiac Enzymes: Recent Labs  Lab 03/04/19 1139  TROPONINI 0.03*   BNP (last 3 results) No results for input(s): PROBNP in the last 8760 hours. HbA1C: No results for input(s): HGBA1C in the last 72 hours. CBG: No results for input(s): GLUCAP in the last 168 hours. Lipid Profile: No results for input(s): CHOL, HDL, LDLCALC, TRIG, CHOLHDL, LDLDIRECT in the last 72 hours. Thyroid Function Tests: No results for input(s): TSH, T4TOTAL, FREET4, T3FREE, THYROIDAB in the last 72 hours. Anemia Panel: No results for input(s): VITAMINB12, FOLATE, FERRITIN, TIBC, IRON, RETICCTPCT in the last 72 hours. Urine analysis:    Component Value Date/Time   COLORURINE YELLOW 12/23/2015 0058   APPEARANCEUR CLEAR 12/23/2015 0058   LABSPEC 1.011 12/23/2015 0058   PHURINE 7.5 12/23/2015 0058   GLUCOSEU NEGATIVE 12/23/2015 0058   HGBUR NEGATIVE 12/23/2015 0058   BILIRUBINUR NEGATIVE 12/23/2015 0058   KETONESUR NEGATIVE 12/23/2015 0058   PROTEINUR NEGATIVE 12/23/2015 0058    NITRITE NEGATIVE 12/23/2015 0058   LEUKOCYTESUR NEGATIVE 12/23/2015 0058   Sepsis Labs: No results found for this or any previous visit (from the past 240 hour(s)).   Radiological Exams on Admission: Dg Chest 2 View  Result Date: 03/04/2019 CLINICAL DATA:  Chest  pain EXAM: CHEST - 2 VIEW COMPARISON:  06/15/2018 FINDINGS: The heart size and mediastinal contours are within normal limits. Both lungs are clear. The visualized skeletal structures are unremarkable. IMPRESSION: No active cardiopulmonary disease. Electronically Signed   By: Kathreen Devoid   On: 03/04/2019 12:14    EKG: Independently reviewed.  Atrial fibrillation at a rate of 143 beats  Assessment/Plan Atrial fibrillation with RVR, on chronic anticoagulation: Acute on chronic.  Patient presents with 4-day history of palpitations.  On admission patient found to be in A. fib with RVR with heart rates into the 160s.  Patient was given IV diltiazem and subsequently oral diltiazem without improvement in symptoms.  Subsequently placed on a diltiazem drip.  Cardiology was notified, but not formally consulted.  TSH noted to be 1.67.  INR was noted to be therapeutic at 2.2. -Admit to a progressive bed -EKG as needed -Continue Coumadin per pharmacy -Continue diltiazem drip -Check magnesium -Give 40 mEq of potassium chloride po x1 dose -Goal potassium 4 and magnesium 2 -Formally consult cardiology in a.m., if needed  Elevated troponin: Acute.  Troponin 0.03.  Suspect likely related with acute demand with rapid heart rate.  Significant signs of ischemia noted on EKG. -Trend troponin  Dyspnea: Chest x-ray otherwise clear of any acute abnormalities.  Suspect symptoms related to above -Albuterol nebulizer as needed for shortness of breath  Anxiety/depression -Continue Cymbalta  History of partial seizures: Denies any recent seizure activity. -Continue Lamictal and gabapentin  History of pulmonary embolus -Continue Coumadin  Weight  loss: Patient reports weight loss of 7 pounds over last week.  TSH within normal limits. -May warrant further work-up in outpatient setting  Obesity: BMI 34.46  DVT prophylaxis: Coumadin Code Status: Full Family Communication: No family present at bedside Disposition Plan: Likely discharge home if able to obtain rate control Consults called: None Admission status: Observation  Norval Morton MD [facility] Pager [phone]   If 7PM-7AM, please contact night-coverage [email or web address] Password [facility]  03/04/2019, 3:14 PM

## 2019-03-05 ENCOUNTER — Observation Stay (HOSPITAL_BASED_OUTPATIENT_CLINIC_OR_DEPARTMENT_OTHER): Payer: Medicare Other

## 2019-03-05 DIAGNOSIS — I4891 Unspecified atrial fibrillation: Secondary | ICD-10-CM | POA: Diagnosis not present

## 2019-03-05 DIAGNOSIS — I480 Paroxysmal atrial fibrillation: Secondary | ICD-10-CM | POA: Diagnosis not present

## 2019-03-05 LAB — CBC
HCT: 44.7 % (ref 36.0–46.0)
Hemoglobin: 14.5 g/dL (ref 12.0–15.0)
MCH: 30.7 pg (ref 26.0–34.0)
MCHC: 32.4 g/dL (ref 30.0–36.0)
MCV: 94.5 fL (ref 80.0–100.0)
Platelets: 156 10*3/uL (ref 150–400)
RBC: 4.73 MIL/uL (ref 3.87–5.11)
RDW: 14.6 % (ref 11.5–15.5)
WBC: 9.6 10*3/uL (ref 4.0–10.5)
nRBC: 0 % (ref 0.0–0.2)

## 2019-03-05 LAB — BASIC METABOLIC PANEL
Anion gap: 10 (ref 5–15)
BUN: 20 mg/dL (ref 8–23)
CO2: 27 mmol/L (ref 22–32)
Calcium: 8.8 mg/dL — ABNORMAL LOW (ref 8.9–10.3)
Chloride: 104 mmol/L (ref 98–111)
Creatinine, Ser: 0.97 mg/dL (ref 0.44–1.00)
GFR calc Af Amer: >60 mL/min (ref >60)
GFR calc non Af Amer: 58 mL/min — ABNORMAL LOW (ref >60)
Glucose, Bld: 109 mg/dL — ABNORMAL HIGH (ref 70–99)
Potassium: 4.8 mmol/L (ref 3.5–5.1)
Sodium: 141 mmol/L (ref 135–145)

## 2019-03-05 LAB — PROTIME-INR
INR: 2.3 — ABNORMAL HIGH (ref 0.8–1.2)
Prothrombin Time: 24.8 seconds — ABNORMAL HIGH (ref 11.4–15.2)

## 2019-03-05 LAB — TROPONIN I: Troponin I: 0.03 ng/mL (ref <0.03)

## 2019-03-05 LAB — ECHOCARDIOGRAM COMPLETE
Height: 68 in
Weight: 3632 oz

## 2019-03-05 MED ORDER — DILTIAZEM HCL 60 MG PO TABS
60.0000 mg | ORAL_TABLET | Freq: Four times a day (QID) | ORAL | Status: DC
  Administered 2019-03-05: 60 mg via ORAL
  Filled 2019-03-05: qty 1

## 2019-03-05 MED ORDER — DILTIAZEM HCL 60 MG PO TABS
30.0000 mg | ORAL_TABLET | Freq: Once | ORAL | Status: AC
  Administered 2019-03-05: 30 mg via ORAL
  Filled 2019-03-05: qty 1

## 2019-03-05 MED ORDER — DILTIAZEM HCL ER COATED BEADS 360 MG PO CP24: 360.0000 mg | ORAL_CAPSULE | Freq: Every day | ORAL | 0 refills | Status: DC

## 2019-03-05 MED ORDER — WARFARIN SODIUM 2.5 MG PO TABS: 2.5000 mg | ORAL_TABLET | ORAL | Status: DC

## 2019-03-05 MED ORDER — WARFARIN SODIUM 5 MG PO TABS
5.0000 mg | ORAL_TABLET | ORAL | Status: DC
  Filled 2019-03-05: qty 1

## 2019-03-05 MED ORDER — DILTIAZEM HCL ER COATED BEADS 180 MG PO CP24
360.0000 mg | ORAL_CAPSULE | Freq: Every day | ORAL | Status: DC
  Administered 2019-03-05: 360 mg via ORAL
  Filled 2019-03-05: qty 2

## 2019-03-05 MED ORDER — DILTIAZEM HCL 60 MG PO TABS: 90.0000 mg | ORAL_TABLET | Freq: Four times a day (QID) | ORAL | Status: DC

## 2019-03-05 NOTE — Care Management Obs Status (Signed)
[place] NOTIFICATION   Patient Details  Name: Debbie [name] MRN: [id number] Date of Birth: 1946/08/23   Medicare Observation Status Notification Given:  Yes    Bethena Roys, RN 03/05/2019, 4:33 PM

## 2019-03-05 NOTE — Progress Notes (Signed)
Nutrition Brief Note  Patient identified on the Malnutrition Screening Tool (MST) Report  Patient currently consuming 100% of meals. Appetite improved. Pt with insignificant weight loss.  Wt Readings from Last 15 Encounters:  03/05/19 103 kg  01/15/19 105.7 kg  12/21/18 106.6 kg  12/04/18 104.4 kg  11/16/18 105.3 kg  09/12/18 106.3 kg  09/03/18 105.9 kg  06/18/18 107 kg  06/15/18 106.6 kg  06/13/18 106.6 kg  05/28/18 106 kg  04/19/18 104.3 kg  03/12/18 104.4 kg  12/01/17 103.9 kg  10/19/17 103.1 kg    Body mass index is 34.52 kg/m. Patient meets criteria for obesity based on current BMI.   Current diet order is heart healthy, patient is consuming approximately 100% of meals at this time. Labs and medications reviewed.   No nutrition interventions warranted at this time. If nutrition issues arise, please consult RD.   Clayton Bibles, MS, RD, [place] Dietitian Pager: [phone] After Hours Pager: [phone]

## 2019-03-05 NOTE — Progress Notes (Signed)
ANTICOAGULATION CONSULT NOTE - Follow Up Consult  Pharmacy Consult for Coumadin Indication: atrial fibrillation  Allergies  Allergen Reactions  . Diclofenac Shortness Of Breath  . Atorvastatin Other (See Comments)    Muscle pain  . Tape Other (See Comments)    Peels skin off (bandaids, too) unknown  . Mushroom Extract Complex Nausea And Vomiting  . Tramadol Other (See Comments) and Nausea And Vomiting  . Buprenorphine Hcl Nausea And Vomiting  . Codeine Nausea And Vomiting  . Fish Oil Nausea And Vomiting  . Morphine And Related Nausea And Vomiting    Patient Measurements: Height: 5\' 8"  (172.7 cm) Weight: 227 lb (103 kg) IBW/kg (Calculated) : 63.9  Vital Signs: Temp: 97.5 F (36.4 C) (04/28 0421) Temp Source: Oral (04/28 0421) BP: 120/85 (04/28 0900) Pulse Rate: 99 (04/28 0900)  Labs: Recent Labs    03/04/19 1139 03/04/19 1619 03/04/19 2105 03/05/19 0314  HGB 16.4*  --   --  14.5  HCT 50.3*  --   --  44.7  PLT 207  --   --  156  LABPROT 24.1*  --   --  24.8*  INR 2.2*  --   --  2.3*  CREATININE 1.11*  --   --  0.97  TROPONINI 0.03* 0.03* 0.03* 0.03*    Estimated Creatinine Clearance: 65.8 mL/min (by C-G formula based on SCr of 0.97 mg/dL).  Assessment:  Anticoag: Afib. INR 2.3 today. Hgb WNL - pta warfarin with admit INR 2.2 is 5mg  daily except 2.5mg  on Sunday   Goal of Therapy:  INR 2-3 Monitor platelets by anticoagulation protocol: Yes   Plan:  Continue home Coumadin 5mg  daily except 2.5mg  on Sunday  Daily INR   Ayauna Mcnay S. Alford Highland, PharmD, BCPS Clinical Staff Pharmacist Eilene Ghazi Stillinger 03/05/2019,11:54 AM

## 2019-03-05 NOTE — Progress Notes (Signed)
Reviewed d/c instructions with patient and copy given.  Patient did not want to take coumadin, states she will take at home.  IV d/ced without problem.  D/ced via WC.

## 2019-03-05 NOTE — Discharge Summary (Addendum)
Physician Discharge Summary  Debbie [name] CZY:[id number] DOB: [birth year] DOA: 03/04/2019  PCP: [name], [name], MD  Admit date: 03/04/2019 Discharge date: 03/05/2019  Time spent: 40 minutes  Recommendations for Outpatient Follow-up:  1. Follow outpatient CBC/CMP 2. Follow heart rate as outpatient, adjust AV nodal blockers as needed 3. Ensure follow up with cards outpatient  4. Follow up weight loss as outpatient   Discharge Diagnoses:  Principal Problem:   Atrial fibrillation with RVR ([place]) Active Problems:   Dyspnea   Anticoagulant long-term use   Depression   Long term current use of anticoagulant   Elevated troponin   History of partial seizures   Discharge Condition: stable  Diet recommendation: heart healthy  Filed Weights   03/04/19 1136 03/04/19 1700 03/05/19 0421  Weight: 101.2 kg 102.8 kg 103 kg    History of present illness:  Debbie [name] is Debbie [name] 73 y.o. female with medical history significant of HTN, paroxysmal Shadasia [place]. fib on Coumadin followed by Dr. Harrell Gave, seizure disorder, and anxiety/depression; who presents with complaints of 4 days of increasing pounding in her chest.  Reports that she knew that she was in atrial fibrillation and had tried taking extra doses of diltiazem for last couple of days without relief.  Notes associated symptoms of shortness of breath, generalized malaise, chest discomfort on the left side, poor appetite, and weight loss of approximately 7 pounds over this week.  At home patient was checking her pulse oximetry which read anywhere from 90 to 93%.  On her way to the hospital she notes having some mild nausea due to the ride.  Denies having any significant fever, chills, significant cough, or dysuria.   ED Course: On admission at an emergency department patient was seen to be in atrial fibrillation with RVR with heart rates into the 160s.  Patient INR noted to be 2.2.  Despite additional doses of IV and oral diltiazem the heart rates  remain uncontrolled, and was switched to Symon Norwood diltiazem drip.  Dr. Harrell Gave of cardiology was notified, but recommended not cardioverting at this time.  TR[facility] called to admit.  Cardiology to be formally admitted if needed in [facility].m.  She was admitted for afib with RVR.  She improved on dilt gtt.  Her home dilt was increased to 360 mg daily and she was discharged on this new dose.  HR at time of d/c were ~90's-110's.   Hospital Course:  Atrial fibrillation with RVR, on chronic anticoagulation: Acute on chronic.  Patient presents with 4-day history of palpitations.  On admission patient found to be in Neeya Prig[place]. fib with RVR with heart rates into the 160s.  Patient was given IV diltiazem and subsequently oral diltiazem without improvement in symptoms.  Subsequently placed on Rozalyn Osland diltiazem drip.  Cardiology was notified, but not formally consulted.  TSH noted to be 1.67.  INR was noted to be therapeutic at 2.2. -Feels better on day of discharge, HR better in 90's-110's.  Discharged on 360 mg diltiazem daily, higher dose.  -Continue Coumadin per pharmacy - discussed need for follow up with cards  Elevated troponin: Acute.  Troponin 0.03.  Suspect likely related with acute demand with rapid heart rate.  Significant signs of ischemia noted on EKG. -troponin low and flat  Dyspnea: Chest x-ray otherwise clear of any acute abnormalities.  Suspect symptoms related to above -Albuterol nebulizer as needed for shortness of breath  Anxiety/depression -Continue Cymbalta  History of partial seizures: Denies any recent seizure activity. -Continue Lamictal and gabapentin  History of pulmonary embolus -Continue Coumadin  Weight loss: Patient reports weight loss of 7 pounds over last week.  TSH within normal limits. -May warrant further work-up in outpatient setting  Obesity: BMI 34.46  Procedures: Echo IMPRESSIONS    1. The left ventricle has normal systolic function, with an ejection fraction of 55-60%.  The cavity size was normal. Left ventricular diastolic function could not be evaluated secondary to atrial fibrillation.  2. The entire echo was extremely difficult and the images are suboptimal.  3. The right ventricle has normal systolic function. The cavity was normal. There is no increase in right ventricular wall thickness.  4. Right atrial size was mildly dilated.  5. Trivial pericardial effusion is present.  6. The mitral valve is grossly normal.  7. The aortic valve was not assessed.  8. The interatrial septum was not assessed.  Consultations:  none  Discharge Exam: Vitals:   03/05/19 1454 03/05/19 1610  BP: 129/82 (!) 135/95  Pulse: (!) 105   Resp:    Temp: 98 F (36.7 C)   SpO2:     Still feels fluttering Feels better overall. Would like to go.  Comfortable with d/c. Discussed need to f/u as outpatient with cards.  General: No acute distress. Cardiovascular: irregular, tachy Lungs: Clear to auscultation bilaterally  Abdomen: Soft, nontender, nondistended  Neurological: Alert and oriented 3. Moves all extremities 4. Cranial nerves II through XII grossly intact. Skin: Warm and dry. No rashes or lesions. Extremities: No clubbing or cyanosis. No edema.  Discharge Instructions   Discharge Instructions    Call MD for:  difficulty breathing, headache or visual disturbances   Complete by:  As directed    Call MD for:  extreme fatigue   Complete by:  As directed    Call MD for:  persistant dizziness or light-headedness   Complete by:  As directed    Call MD for:  persistant nausea and vomiting   Complete by:  As directed    Call MD for:  redness, tenderness, or signs of infection (pain, swelling, redness, odor or green/yellow discharge around incision site)   Complete by:  As directed    Call MD for:  severe uncontrolled pain   Complete by:  As directed    Call MD for:  temperature >100.4   Complete by:  As directed    Diet - low sodium heart healthy    Complete by:  As directed    Discharge instructions   Complete by:  As directed    You were seen for atrial fibrillation with [name] fast heart rate.  This improved with IV diltiazem.  We increased your long acting diltiazem to 360 mg daily.  Your echocardiogram was reassuring.  Please follow up with your PCP and cardiologist as an outpatient.  Return for new, recurrent, or worsening symptoms.  Please ask your PCP to request records from this hospitalization so they know what was done and what the next steps will be.   Increase activity slowly   Complete by:  As directed      Allergies as of 03/05/2019      Reactions   Diclofenac Shortness Of Breath   Atorvastatin Other (See Comments)   Muscle pain   Tape Other (See Comments)   Peels skin off (bandaids, too) unknown   Mushroom Extract Complex Nausea And Vomiting   Tramadol Other (See Comments), Nausea And Vomiting   Buprenorphine Hcl Nausea And Vomiting   Codeine Nausea And Vomiting  Fish Oil Nausea And Vomiting   Morphine And Related Nausea And Vomiting      Medication List    STOP taking these medications   diltiazem 30 MG tablet Commonly known as:  Cardizem     TAKE these medications   acetaminophen 325 MG tablet Commonly known as:  TYLENOL Take 2 tablets (650 mg total) by mouth every 6 (six) hours.   calcium carbonate 600 MG Tabs tablet Commonly known as:  OS-CAL Take 600 mg by mouth daily with breakfast.   diltiazem 360 MG 24 hr capsule Commonly known as:  CARDIZEM CD Take 1 capsule (360 mg total) by mouth daily for 30 days. Start taking on:  March 06, 2019 What changed:    medication strength  how much to take   DULoxetine 20 MG capsule Commonly known as:  CYMBALTA Take 2 capsules (40 mg total) by mouth daily.   gabapentin 600 MG tablet Commonly known as:  NEURONTIN Take 1 tablet (600 mg total) by mouth 3 (three) times daily.   HYDROcodone-acetaminophen 5-325 MG tablet Commonly known as:   NORCO/VICODIN Take 1 tablet by mouth every 8 (eight) hours as needed for moderate pain.   lamoTRIgine 25 MG tablet Commonly known as:  LAMICTAL Take 1 tablet (25 mg total) by mouth 2 (two) times daily.   Na Sulfate-K Sulfate-Mg Sulf 17.5-3.13-1.6 GM/177ML Soln Suprep-Use as directed   polyethylene glycol 17 g packet Commonly known as:  MIRALAX / GLYCOLAX Take 17 g by mouth as needed.   Vitamin D (Ergocalciferol) 1.25 MG (50000 UT) Caps capsule Commonly known as:  DRISDOL 1 cap every 10 days.   warfarin 5 MG tablet Commonly known as:  COUMADIN Take as directed. If you are unsure how to take this medication, talk to your nurse or doctor. Original instructions:  Take 1 tablet (5 mg total) by mouth daily. as directed What changed:  additional instructions      Allergies  Allergen Reactions  . Diclofenac Shortness Of Breath  . Atorvastatin Other (See Comments)    Muscle pain  . Tape Other (See Comments)    Peels skin off (bandaids, too) unknown  . Mushroom Extract Complex Nausea And Vomiting  . Tramadol Other (See Comments) and Nausea And Vomiting  . Buprenorphine Hcl Nausea And Vomiting  . Codeine Nausea And Vomiting  . Fish Oil Nausea And Vomiting  . Morphine And Related Nausea And Vomiting      The results of significant diagnostics from this hospitalization (including imaging, microbiology, ancillary and laboratory) are listed below for reference.    Significant Diagnostic Studies: Dg Chest 2 View  Result Date: 03/04/2019 CLINICAL DATA:  Chest pain EXAM: CHEST - 2 VIEW COMPARISON:  06/15/2018 FINDINGS: The heart size and mediastinal contours are within normal limits. Both lungs are clear. The visualized skeletal structures are unremarkable. IMPRESSION: No active cardiopulmonary disease. Electronically Signed   By: Kathreen Devoid   On: 03/04/2019 12:14    Microbiology: No results found for this or any previous visit (from the past 240 hour(s)).   Labs: Basic  Metabolic Panel: Recent Labs  Lab 03/04/19 1139 03/04/19 1619 03/05/19 0314  NA 141  --  141  K 3.6  --  4.8  CL 103  --  104  CO2 26  --  27  GLUCOSE 122*  --  109*  BUN 23  --  20  CREATININE 1.11*  --  0.97  CALCIUM 9.0  --  8.8*  MG  --  2.2  --    Liver Function Tests: No results for input(s): AST, ALT, ALKPHOS, BILITOT, PROT, ALBUMIN in the last 168 hours. No results for input(s): LIPASE, AMYLASE in the last 168 hours. No results for input(s): AMMONIA in the last 168 hours. CBC: Recent Labs  Lab 03/04/19 1139 03/05/19 0314  WBC 11.5* 9.6  HGB 16.4* 14.5  HCT 50.3* 44.7  MCV 94.4 94.5  PLT 207 156   Cardiac Enzymes: Recent Labs  Lab 03/04/19 1139 03/04/19 1619 03/04/19 2105 03/05/19 0314  TROPONINI 0.03* 0.03* 0.03* 0.03*   BNP: BNP (last 3 results) No results for input(s): BNP in the last 8760 hours.  ProBNP (last 3 results) No results for input(s): PROBNP in the last 8760 hours.  CBG: No results for input(s): GLUCAP in the last 168 hours.     Signed:  Fayrene Helper MD.  [facility] 03/05/2019, 4:38 PM

## 2019-03-06 ENCOUNTER — Telehealth: Payer: Self-pay | Admitting: *Deleted

## 2019-03-06 NOTE — Telephone Encounter (Signed)
Transition Care Management Follow-up Telephone Call   Date discharged? 03/05/2019   How have you been since you were released from the hospital? "I was nauseated all night"   Do you understand why you were in the hospital? yes   Do you understand the discharge instructions? yes   Where were you discharged to? Dr. [name]    Items Reviewed:  Medications reviewed: yes  Allergies reviewed: yes  Dietary changes reviewed: N/A  Referrals reviewed: N/A    Functional Questionnaire:   Activities of Daily Living (ADLs):   She states they are independent in the following: ambulation, bathing and hygiene, feeding, continence, grooming, toileting and dressing States they require assistance with the following: N/A    Any transportation issues/concerns?: no   Any patient concerns? No, just nausea from the increased dose of Cardizem   Confirmed importance and date/time of follow-up visits scheduled yes   Provider Appointment booked with Dr. [name] on 03/08/2019 at 12PM   Confirmed with patient if condition begins to worsen call PCP or go to the ER.  Patient was given the office number and encouraged to call back with question or concerns.  : yes

## 2019-03-07 ENCOUNTER — Telehealth: Payer: Self-pay | Admitting: Cardiology

## 2019-03-07 NOTE — Telephone Encounter (Signed)
lmsg for pt to call back. Needs hospital fu appt in 2-3 weeks with PA per Dr. Harrell Gave.

## 2019-03-08 ENCOUNTER — Ambulatory Visit (INDEPENDENT_AMBULATORY_CARE_PROVIDER_SITE_OTHER): Payer: Medicare Other | Admitting: Family Medicine

## 2019-03-08 ENCOUNTER — Encounter: Payer: Self-pay | Admitting: Family Medicine

## 2019-03-08 ENCOUNTER — Other Ambulatory Visit: Payer: Self-pay

## 2019-03-08 DIAGNOSIS — Z7901 Long term (current) use of anticoagulants: Secondary | ICD-10-CM

## 2019-03-08 DIAGNOSIS — I129 Hypertensive chronic kidney disease with stage 1 through stage 4 chronic kidney disease, or unspecified chronic kidney disease: Secondary | ICD-10-CM

## 2019-03-08 DIAGNOSIS — N183 Chronic kidney disease, stage 3 unspecified: Secondary | ICD-10-CM

## 2019-03-08 DIAGNOSIS — M797 Fibromyalgia: Secondary | ICD-10-CM

## 2019-03-08 DIAGNOSIS — I4891 Unspecified atrial fibrillation: Secondary | ICD-10-CM | POA: Diagnosis not present

## 2019-03-08 DIAGNOSIS — N182 Chronic kidney disease, stage 2 (mild): Secondary | ICD-10-CM

## 2019-03-08 NOTE — Assessment & Plan Note (Signed)
Problem has improved, currently she is asymptomatic. No changes in diltiazem CD 360 mg daily. If frequent HR < 60, we may need to decrease dose of diltiazem. Instructed about warning signs. She has an appointment with cardiologist on 03/19/2019.

## 2019-03-08 NOTE — Assessment & Plan Note (Signed)
In general problem is stable. Explained that it may take a few weeks to go back to her baseline in regard to pain and fatigue. Fall precautions. No changes in Cymbalta or gabapentin. Continue following with pain management.

## 2019-03-08 NOTE — Assessment & Plan Note (Signed)
Problem has been stable. I do not think BMP needs to be repeated at this time and she does not feel comfortable leaving her house. We will plan on doing blood work next visit.
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
## 2019-03-08 NOTE — Assessment & Plan Note (Signed)
BP adequately controlled. No changes in current management. Continue monitoring BP periodically. Eye exam is current. Continue low-salt diet.

## 2019-03-08 NOTE — Assessment & Plan Note (Addendum)
INR at goal. No changes in Coumadin dose. She has an appointment with Coumadin clinic on 03/29/2019.

## 2019-03-13 ENCOUNTER — Ambulatory Visit: Payer: Medicare Other | Admitting: Family Medicine

## 2019-03-19 ENCOUNTER — Telehealth: Payer: Self-pay

## 2019-03-19 ENCOUNTER — Encounter: Payer: Self-pay | Admitting: Cardiology

## 2019-03-19 ENCOUNTER — Telehealth: Payer: Self-pay | Admitting: *Deleted

## 2019-03-19 ENCOUNTER — Telehealth (INDEPENDENT_AMBULATORY_CARE_PROVIDER_SITE_OTHER): Payer: Medicare Other | Admitting: Cardiology

## 2019-03-19 VITALS — BP 142/84 | HR 58 | Ht 69.0 in | Wt 234.0 lb

## 2019-03-19 DIAGNOSIS — I129 Hypertensive chronic kidney disease with stage 1 through stage 4 chronic kidney disease, or unspecified chronic kidney disease: Secondary | ICD-10-CM

## 2019-03-19 DIAGNOSIS — I10 Essential (primary) hypertension: Secondary | ICD-10-CM

## 2019-03-19 DIAGNOSIS — G4730 Sleep apnea, unspecified: Secondary | ICD-10-CM

## 2019-03-19 DIAGNOSIS — I480 Paroxysmal atrial fibrillation: Secondary | ICD-10-CM

## 2019-03-19 DIAGNOSIS — Z86711 Personal history of pulmonary embolism: Secondary | ICD-10-CM

## 2019-03-19 DIAGNOSIS — M797 Fibromyalgia: Secondary | ICD-10-CM

## 2019-03-19 DIAGNOSIS — Z7901 Long term (current) use of anticoagulants: Secondary | ICD-10-CM

## 2019-03-19 DIAGNOSIS — Z8669 Personal history of other diseases of the nervous system and sense organs: Secondary | ICD-10-CM

## 2019-03-19 DIAGNOSIS — N183 Chronic kidney disease, stage 3 unspecified: Secondary | ICD-10-CM

## 2019-03-19 MED ORDER — DILTIAZEM HCL ER COATED BEADS 240 MG PO CP24: 240.0000 mg | ORAL_CAPSULE | ORAL | 3 refills | Status: DC

## 2019-03-19 MED ORDER — DILTIAZEM HCL ER COATED BEADS 360 MG PO CP24: 360.0000 mg | ORAL_CAPSULE | ORAL | 0 refills | Status: DC

## 2019-03-19 NOTE — Telephone Encounter (Signed)
Contacted patient to discuss AVS instructions. Patient agreeable with Luke recommendations and voiced understanding. 

## 2019-03-19 NOTE — Patient Instructions (Signed)
Medication Instructions:  START Diltiazem 240mg  Take 1 tablet every other day rotating with Dilitazem 360mg   If you need a refill on your cardiac medications before your next appointment, please call your pharmacy.   Lab work: None  If you have labs (blood work) drawn today and your tests are completely normal, you will receive your results only by: Marland Kitchen MyChart Message (if you have MyChart) OR . A paper copy in the mail If you have any lab test that is abnormal or we need to change your treatment, we will call you to review the results.  Testing/Procedures: None   Follow-Up: At [facility], you and your health needs are our priority.  As part of our continuing mission to provide you with exceptional heart care, we have created designated Provider Care Teams.  These Care Teams include your primary Cardiologist (physician) and Advanced Practice Providers (APPs -  Physician Assistants and Nurse Practitioners) who all work together to provide you with the care you need, when you need it. You will need a follow up appointment in 3 months.  Please call our office 2 months in advance to schedule this appointment.  You may see Buford Dresser, MD or one of the following Advanced Practice Providers on your designated Care Team:   Rosaria Ferries, PA-C . Jory Sims, DNP, ANP  Any Other Special Instructions Will Be Listed Below (If Applicable).

## 2019-03-19 NOTE — Progress Notes (Signed)
Virtual Visit via Video Note   This visit type was conducted due to national recommendations for restrictions regarding the COVID-19 Pandemic (e.g. social distancing) in an effort to limit this patient's exposure and mitigate transmission in our community.  Due to her co-morbid illnesses, this patient is at least at moderate risk for complications without adequate follow up.  This format is felt to be most appropriate for this patient at this time.  All issues noted in this document were discussed and addressed.  A limited physical exam was performed with this format.  Please refer to the patient's chart for her consent to telehealth for [facility].   Date:  03/19/2019   ID:  Debbie [name], DOB 04-15-1946, MRN [id number]  Patient Location: Home Provider Location: Home  PCP:  [name], [name], MD  Cardiologist:  Buford Dresser, MD  Electrophysiologist:  None   Evaluation Performed:  Follow-Up Visit  Chief Complaint:  fatugue  History of Present Illness:    Debbie [name] is a 73 y.o. female with a history of symptomatic PAF.  She had a radiofrequency ablation in [facility] in 2017.  Other medical issues include a history of pulmonary embolism 2017, chronic renal insufficiency stage II, sleep apnea, not on CPAP, and treated hypertension.  She is on chronic Coumadin therapy.  When she is in atrial fibrillation she is quite symptomatic.  When Dr. Harrell Gave saw her in March she was in sinus rhythm.  She was on diltiazem 240 mg a day.  She presented to the hospital March 04, 2019 with symptomatic atrial fibrillation with rapid ventricular response.  Her diltiazem was increased to 360 mg a day.  Echocardiogram shows preserved LV function with an EF of 55 to 60%.  Since discharge she says her heart went out of rhythm once for couple hours but has remained in the 50s since.  Her main complaint is fatigue with exertion.  She says at times her heart rate is now in the high 40s.  She  complains that she is fatigue with any exertion, although it is not clear this is dramatically different than prior to her recent hospitalization.  Recent TSH in the hospital was normal.  The patient does not have symptoms concerning for COVID-19 infection (fever, chills, cough, or new shortness of breath).    Past Medical History:  Diagnosis Date  . Atrial fibrillation ([place])   . Benign essential tremor   . Breast cancer ([place])   . Chronic renal insufficiency   . Common migraine 05/07/2015  . Complication of anesthesia   . Depression with anxiety   . Drowsiness    Excessive daytime  . Dyslipidemia   . Fibromyalgia   . History of partial seizures   . Hypertension   . Memory disturbance   . Muscle tension headache   . Obesity   . Obstructive sleep apnea   . PONV (postoperative nausea and vomiting)   . S/P epidural steroid injection   . Sacral contusion 07/2018  . Seizures ([place])    Partial  . Sprain of neck 06/26/2013  . Tremors of nervous system   . Vitamin B12 deficiency    Past Surgical History:  Procedure Laterality Date  . ABDOMINAL HYSTERECTOMY    . ablation procedure     Cardiac, for Afib  . BASAL CELL CARCINOMA EXCISION    . BASAL CELL CARCINOMA EXCISION    . BRAIN MENINGIOMA EXCISION Right   . BREAST RECONSTRUCTION Bilateral   . breast  reconstructive    . CATARACT EXTRACTION, BILATERAL    . CHOLECYSTECTOMY    . DILATION AND CURETTAGE OF UTERUS    . dilution    . ESOPHAGEAL MANOMETRY N/A 12/14/2016   Procedure: ESOPHAGEAL MANOMETRY (EM);  Surgeon: Mauri Pole, MD;  Location: [facility];  Service: Endoscopy;  Laterality: N/A;  . gallbladder resection    . LUMBAR SPINE SURGERY N/A   . lumbosacral    . mastectomies Bilateral   . NOSE SURGERY     Basal cell carcinoma resection from the nose  . TUBAL LIGATION N/A      Current Meds  Medication Sig  . acetaminophen (TYLENOL) 325 MG tablet Take 2 tablets (650 mg total) by mouth every 6 (six) hours.  .  calcium carbonate (OS-CAL) 600 MG TABS tablet Take 600 mg by mouth daily with breakfast.  . diltiazem (CARDIZEM CD) 360 MG 24 hr capsule Take 1 capsule (360 mg total) by mouth daily for 30 days.  . DULoxetine (CYMBALTA) 20 MG capsule Take 2 capsules (40 mg total) by mouth daily.  Marland Kitchen gabapentin (NEURONTIN) 600 MG tablet Take 1 tablet (600 mg total) by mouth 3 (three) times daily. (Patient taking differently: Take 600 mg by mouth 2 (two) times daily. )  . HYDROcodone-acetaminophen (NORCO/VICODIN) 5-325 MG tablet Take 1 tablet by mouth every 8 (eight) hours as needed for moderate pain.   Marland Kitchen lamoTRIgine (LAMICTAL) 25 MG tablet Take 1 tablet (25 mg total) by mouth 2 (two) times daily.  . Na Sulfate-K Sulfate-Mg Sulf 17.5-3.13-1.6 GM/177ML SOLN Suprep-Use as directed  . polyethylene glycol (MIRALAX / GLYCOLAX) packet Take 17 g by mouth as needed.  . Vitamin D, Ergocalciferol, (DRISDOL) 1.25 MG (50000 UT) CAPS capsule 1 cap every 10 days.  Marland Kitchen warfarin (COUMADIN) 5 MG tablet Take 1 tablet (5 mg total) by mouth daily. as directed (Patient taking differently: Take 5 mg by mouth daily. as directed. Taking 5mg  daily except on Sunday 2.5mg )     Allergies:   Diclofenac; Atorvastatin; Tape; Mushroom extract complex; Tramadol; Buprenorphine hcl; Codeine; Fish oil; and Morphine and related   Social History   Tobacco Use  . Smoking status: Never Smoker  . Smokeless tobacco: Never Used  Substance Use Topics  . Alcohol use: No  . Drug use: No     Family Hx: The patient's family history includes Arthritis in her father and mother; Breast cancer in her sister; Cancer in her father; Cancer - Colon in her father; Colon cancer in her father; Diabetes in her maternal grandfather and mother; Heart disease in her mother; Heart failure in her maternal grandfather and mother; Hyperlipidemia in her mother; Hypertension in her mother; Stroke in her maternal grandmother and mother. There is no history of Lung disease.  ROS:    Please see the history of present illness.    All other systems reviewed and are negative.   Prior CV studies:   The following studies were reviewed today:   Labs/Other Tests and Data Reviewed:    EKG:  No ECG reviewed.  Recent Labs: 07/12/2018: ALT 19 03/04/2019: Magnesium 2.2; TSH 1.867 03/05/2019: BUN 20; Creatinine, Ser 0.97; Hemoglobin 14.5; Platelets 156; Potassium 4.8; Sodium 141   Recent Lipid Panel No results found for: CHOL, TRIG, HDL, CHOLHDL, LDLCALC, LDLDIRECT  Wt Readings from Last 3 Encounters:  03/19/19 234 lb (106.1 kg)  03/05/19 227 lb (103 kg)  01/15/19 233 lb (105.7 kg)     Objective:    Vital Signs:  BP (!) 142/84   Pulse (!) 58   Ht 5\' 9"  (1.753 m)   Wt 234 lb (106.1 kg)   BMI 34.56 kg/m    VITAL SIGNS:  reviewed Pt was awake and alert- no distress ASSESSMENT & PLAN:    Symptomatic PAF- Diltiazem increased after her recent hospitalization.  Her HR is now somewhat low and she c/o fatigue.  Chronic anticoagulation- CHADS VASC=5  sleep apnea- Untreated  H/o PE- 2017    COVID-19 Education: The signs and symptoms of COVID-19 were discussed with the patient and how to seek care for testing (follow up with PCP or arrange E-visit).  The importance of social distancing was discussed today.  Time:   Today, I have spent 20 minutes with the patient with telehealth technology discussing the above problems.     Medication Adjustments/Labs and Tests Ordered: Current medicines are reviewed at length with the patient today.  Concerns regarding medicines are outlined above.   Tests Ordered: No orders of the defined types were placed in this encounter.   Medication Changes: No orders of the defined types were placed in this encounter.   Disposition:  Follow up Dr Harrell Gave 3 months.  I suggested she change her Diltiazem to 360 mg alternating with 240 mg QOD.  I'll discuss possible refeal to the AF clinic with Dr Harrell Gave  Signed, Kerin Ransom, PA-C  03/19/2019 2:50 PM    [place]

## 2019-03-19 NOTE — Telephone Encounter (Signed)

## 2019-03-20 NOTE — Progress Notes (Signed)
Hi Luke--I think afib referral is ok, but if it's more of a sinus brady we can back down on her standing dilt dose and give her a PRN short acting dose for when she goes into afib. Thanks!

## 2019-03-22 ENCOUNTER — Telehealth: Payer: Self-pay

## 2019-03-22 NOTE — Progress Notes (Signed)
We need a f/u with this patient next week- can you set her up for a phone call  Kerin Ransom PA-C 03/22/2019 11:27 AM

## 2019-03-22 NOTE — Telephone Encounter (Signed)
Who does this patient need to see, because and out and Dr Harrell Gave is out of the office next week?

## 2019-03-25 NOTE — Telephone Encounter (Signed)
Contacted patient to offer her an appt with Jory Sims this week and patient declined an appt and only wants to see [facility] or Dr Harrell Gave.

## 2019-03-25 NOTE — Telephone Encounter (Signed)
Spoke with Lurena Joiner and he states I can offer pt an appt with him in [place] office.   I will reach out to patient and offer her a follow up appt with [facility].   Appt scheduled with Lurena Joiner for Thursday, pt agreeable with appt.   [place] notified.

## 2019-03-27 DIAGNOSIS — G894 Chronic pain syndrome: Secondary | ICD-10-CM | POA: Diagnosis not present

## 2019-03-27 DIAGNOSIS — S3210XA Unspecified fracture of sacrum, initial encounter for closed fracture: Secondary | ICD-10-CM | POA: Diagnosis not present

## 2019-03-27 DIAGNOSIS — Z79899 Other long term (current) drug therapy: Secondary | ICD-10-CM | POA: Diagnosis not present

## 2019-03-27 DIAGNOSIS — M79604 Pain in right leg: Secondary | ICD-10-CM | POA: Diagnosis not present

## 2019-03-27 DIAGNOSIS — M79605 Pain in left leg: Secondary | ICD-10-CM | POA: Diagnosis not present

## 2019-03-28 ENCOUNTER — Other Ambulatory Visit: Payer: Self-pay

## 2019-03-28 ENCOUNTER — Telehealth: Payer: Self-pay

## 2019-03-28 ENCOUNTER — Telehealth (INDEPENDENT_AMBULATORY_CARE_PROVIDER_SITE_OTHER): Payer: Medicare Other | Admitting: Cardiology

## 2019-03-28 VITALS — BP 126/82 | HR 64 | Ht 70.0 in | Wt 234.8 lb

## 2019-03-28 DIAGNOSIS — R5383 Other fatigue: Secondary | ICD-10-CM | POA: Diagnosis not present

## 2019-03-28 DIAGNOSIS — G4730 Sleep apnea, unspecified: Secondary | ICD-10-CM

## 2019-03-28 DIAGNOSIS — Z7901 Long term (current) use of anticoagulants: Secondary | ICD-10-CM

## 2019-03-28 DIAGNOSIS — I480 Paroxysmal atrial fibrillation: Secondary | ICD-10-CM

## 2019-03-28 DIAGNOSIS — Z86711 Personal history of pulmonary embolism: Secondary | ICD-10-CM

## 2019-03-28 MED ORDER — DILTIAZEM HCL ER COATED BEADS 360 MG PO CP24: 360.0000 mg | ORAL_CAPSULE | ORAL | 3 refills | Status: DC

## 2019-03-28 NOTE — Telephone Encounter (Signed)

## 2019-03-28 NOTE — Patient Instructions (Signed)
Medication Instructions:  Your physician recommends that you continue on your current medications as directed. Please refer to the Current Medication list given to you today.  If you need a refill on your cardiac medications before your next appointment, please call your pharmacy.   Lab work: NONE  If you have labs (blood work) drawn today and your tests are completely normal, you will receive your results only by: Marland Kitchen MyChart Message (if you have MyChart) OR . A paper copy in the mail If you have any lab test that is abnormal or we need to change your treatment, we will call you to review the results.  Testing/Procedures: NONE   Follow-Up: At [facility], you and your health needs are our priority.  As part of our continuing mission to provide you with exceptional heart care, we have created designated Provider Care Teams.  These Care Teams include your primary Cardiologist (physician) and Advanced Practice Providers (APPs -  Physician Assistants and Nurse Practitioners) who all work together to provide you with the care you need, when you need it. You will need a follow up appointment in 4 months.  Please call our office 2 months in advance to schedule this appointment.  You may see Buford Dresser, MD or one of the following Advanced Practice Providers on your designated Care Team:   Rosaria Ferries, PA-C . Jory Sims, DNP, ANP  Any Other Special Instructions Will Be Listed Below (If Applicable). Thank you for choosing [facility]!

## 2019-03-28 NOTE — Progress Notes (Signed)
Virtual Visit via Video Note   This visit type was conducted due to national recommendations for restrictions regarding the COVID-19 Pandemic (e.g. social distancing) in an effort to limit this patient's exposure and mitigate transmission in our community.  Due to her co-morbid illnesses, this patient is at least at moderate risk for complications without adequate follow up.  This format is felt to be most appropriate for this patient at this time.  All issues noted in this document were discussed and addressed.  A limited physical exam was performed with this format.  Please refer to the patient's chart for her consent to telehealth for [facility].   Date:  03/28/2019   ID:  Debbie [name], DOB 19-Sep-1946, MRN [id number]  Patient Location: Home Provider Location: Home  PCP:  [name], [name], MD  Cardiologist:  Buford Dresser, MD  Electrophysiologist:  None   Evaluation Performed:  Follow-Up Visit  Chief Complaint:  none  History of Present Illness:    Debbie [name] is a 73 y.o. female with a history of symptomatic PAF.  She had a radiofrequency ablation in [facility] in 2017.  Other medical issues include a history of pulmonary embolism 2017, chronic renal insufficiency stage II, sleep apnea, not on CPAP, and treated hypertension.  She is on chronic Coumadin therapy.  When she is in atrial fibrillation she is quite symptomatic.  When Dr. Harrell Gave saw her in March she was in sinus rhythm.  She was on diltiazem 240 mg a day.  She presented to the hospital March 04, 2019 with symptomatic atrial fibrillation with rapid ventricular response.  Her diltiazem was increased to 360 mg a day.  Echocardiogram shows preserved LV function with an EF of 55 to 60%.  She was seen in a video visit for follow up 03/19/2019. She related that since discharge she says her heart went out of rhythm once for couple hours but has remained in the 50s since.  Her main complaint was fatigue with  exertion.  She said at times her heart rate was in the high 40s.  She complained of fatigue with any exertion.  At that time I suggested she alternate Diltiazem 360 with 240 (which she had at home).  She was contacted today for follow up and says she is doing much better, more energy.  Her HR is consistently in the 60's now.   The patient does not have symptoms concerning for COVID-19 infection (fever, chills, cough, or new shortness of breath).    Past Medical History:  Diagnosis Date  . Atrial fibrillation ([place])   . Benign essential tremor   . Breast cancer ([place])   . Chronic renal insufficiency   . Common migraine 05/07/2015  . Complication of anesthesia   . Depression with anxiety   . Drowsiness    Excessive daytime  . Dyslipidemia   . Fibromyalgia   . History of partial seizures   . Hypertension   . Memory disturbance   . Muscle tension headache   . Obesity   . Obstructive sleep apnea   . PONV (postoperative nausea and vomiting)   . S/P epidural steroid injection   . Sacral contusion 07/2018  . Seizures ([place])    Partial  . Sprain of neck 06/26/2013  . Tremors of nervous system   . Vitamin B12 deficiency    Past Surgical History:  Procedure Laterality Date  . ABDOMINAL HYSTERECTOMY    . ablation procedure     Cardiac, for Afib  .  BASAL CELL CARCINOMA EXCISION    . BASAL CELL CARCINOMA EXCISION    . BRAIN MENINGIOMA EXCISION Right   . BREAST RECONSTRUCTION Bilateral   . breast reconstructive    . CATARACT EXTRACTION, BILATERAL    . CHOLECYSTECTOMY    . DILATION AND CURETTAGE OF UTERUS    . dilution    . ESOPHAGEAL MANOMETRY N/A 12/14/2016   Procedure: ESOPHAGEAL MANOMETRY (EM);  Surgeon: Mauri Pole, MD;  Location: [facility];  Service: Endoscopy;  Laterality: N/A;  . gallbladder resection    . LUMBAR SPINE SURGERY N/A   . lumbosacral    . mastectomies Bilateral   . NOSE SURGERY     Basal cell carcinoma resection from the nose  . TUBAL LIGATION N/A       Current Meds  Medication Sig  . acetaminophen (TYLENOL) 325 MG tablet Take 2 tablets (650 mg total) by mouth every 6 (six) hours.  . calcium carbonate (OS-CAL) 600 MG TABS tablet Take 600 mg by mouth daily with breakfast.  . diltiazem (CARDIZEM CD) 360 MG 24 hr capsule Take 1 capsule (360 mg total) by mouth every other day for 30 days. Rotating with Diltiazem 240mg   . diltiazem (DILTIAZEM CD) 240 MG 24 hr capsule Take 1 capsule (240 mg total) by mouth every other day. Rotating with Diltiazem 360  . DULoxetine (CYMBALTA) 20 MG capsule Take 2 capsules (40 mg total) by mouth daily.  Marland Kitchen gabapentin (NEURONTIN) 600 MG tablet Take 1 tablet (600 mg total) by mouth 3 (three) times daily. (Patient taking differently: Take 600 mg by mouth 2 (two) times daily. )  . HYDROcodone-acetaminophen (NORCO/VICODIN) 5-325 MG tablet Take 1 tablet by mouth every 8 (eight) hours as needed for moderate pain.   Marland Kitchen lamoTRIgine (LAMICTAL) 25 MG tablet Take 1 tablet (25 mg total) by mouth 2 (two) times daily.  . Na Sulfate-K Sulfate-Mg Sulf 17.5-3.13-1.6 GM/177ML SOLN Suprep-Use as directed  . polyethylene glycol (MIRALAX / GLYCOLAX) packet Take 17 g by mouth as needed.  . Vitamin D, Ergocalciferol, (DRISDOL) 1.25 MG (50000 UT) CAPS capsule 1 cap every 10 days.  Marland Kitchen warfarin (COUMADIN) 5 MG tablet Take 1 tablet (5 mg total) by mouth daily. as directed (Patient taking differently: Take 5 mg by mouth daily. as directed. Taking 5mg  daily except on Sunday 2.5mg )     Allergies:   Diclofenac; Atorvastatin; Tape; Mushroom extract complex; Tramadol; Buprenorphine hcl; Codeine; Fish oil; and Morphine and related   Social History   Tobacco Use  . Smoking status: Never Smoker  . Smokeless tobacco: Never Used  Substance Use Topics  . Alcohol use: No  . Drug use: No     Family Hx: The patient's family history includes Arthritis in her father and mother; Breast cancer in her sister; Cancer in her father; Cancer - Colon in her father;  Colon cancer in her father; Diabetes in her maternal grandfather and mother; Heart disease in her mother; Heart failure in her maternal grandfather and mother; Hyperlipidemia in her mother; Hypertension in her mother; Stroke in her maternal grandmother and mother. There is no history of Lung disease.  ROS:   Please see the history of present illness.    All other systems reviewed and are negative.   Prior CV studies:   The following studies were reviewed today:   Labs/Other Tests and Data Reviewed:    EKG:  No ECG reviewed.  Recent Labs: 07/12/2018: ALT 19 03/04/2019: Magnesium 2.2; TSH 1.867 03/05/2019: BUN 20; Creatinine,  Ser 0.97; Hemoglobin 14.5; Platelets 156; Potassium 4.8; Sodium 141   Recent Lipid Panel No results found for: CHOL, TRIG, HDL, CHOLHDL, LDLCALC, LDLDIRECT  Wt Readings from Last 3 Encounters:  03/28/19 234 lb 12.8 oz (106.5 kg)  03/19/19 234 lb (106.1 kg)  03/05/19 227 lb (103 kg)     Objective:    Vital Signs:  BP 126/82   Pulse 64   Ht 5\' 10"  (1.778 m)   Wt 234 lb 12.8 oz (106.5 kg)   BMI 33.69 kg/m    VITAL SIGNS:  reviewed Pt in no acute distress during the interview  ASSESSMENT & PLAN:    Fatigue related to bradycardia- Improved with adjustment of her Diltiazem dose  Symptomatic PAF- H/O symptomatic PAF  Chronic anticoagulation- CHADS VASC=5-Coumadin   sleep apnea- Untreated-pt could not tolerate C-pap  H/o PE- 2017  COVID-19 Education: The signs and symptoms of COVID-19 were discussed with the patient and how to seek care for testing (follow up with PCP or arrange E-visit).  The importance of social distancing was discussed today.  Time:   Today, I have spent 15 minutes with the patient with telehealth technology discussing the above problems.     Medication Adjustments/Labs and Tests Ordered: Current medicines are reviewed at length with the patient today.  Concerns regarding medicines are outlined above.   Tests Ordered:  No orders of the defined types were placed in this encounter.   Medication Changes: No orders of the defined types were placed in this encounter.   Disposition:  Follow up Dr Harrell Gave in Sept.  If the patient has recurrent PAF or recurrent symptomatic bradycardia would strongly consider referral to AF clinic.   Signed, Kerin Ransom, PA-C  03/28/2019 3:10 PM    [facility]

## 2019-03-29 ENCOUNTER — Ambulatory Visit (INDEPENDENT_AMBULATORY_CARE_PROVIDER_SITE_OTHER): Payer: Medicare Other

## 2019-03-29 ENCOUNTER — Other Ambulatory Visit: Payer: Self-pay

## 2019-03-29 DIAGNOSIS — Z5181 Encounter for therapeutic drug level monitoring: Secondary | ICD-10-CM

## 2019-03-29 DIAGNOSIS — I2782 Chronic pulmonary embolism: Secondary | ICD-10-CM

## 2019-03-29 DIAGNOSIS — Z86711 Personal history of pulmonary embolism: Secondary | ICD-10-CM | POA: Diagnosis not present

## 2019-03-29 DIAGNOSIS — Z7901 Long term (current) use of anticoagulants: Secondary | ICD-10-CM

## 2019-03-29 LAB — POCT INR: INR: 2.8 (ref 2.0–3.0)

## 2019-03-29 NOTE — Patient Instructions (Signed)
Description   Called spoke with pt advised to continue on same dosage 1 tablet daily except 1/2 tablet on Sundays. Pt will call with date if Colonoscopy gets scheduled. She will call if rectal bleeding worsens or seek medical attention. Recheck INR in 6 weeks.

## 2019-04-16 ENCOUNTER — Telehealth: Payer: Self-pay | Admitting: *Deleted

## 2019-04-16 NOTE — Telephone Encounter (Signed)
Calling to see if patient is ready to schedule her colonoscopy that she cancelled from May due to [place]....Marland KitchenMarland Kitchen

## 2019-04-17 ENCOUNTER — Telehealth: Payer: Self-pay | Admitting: *Deleted

## 2019-04-17 NOTE — Telephone Encounter (Signed)
Patient with diagnosis of afib on warfarin for anticoagulation.    Procedure: colonoscopy Date of procedure: August  CHADS2-VASc score of  5 (CHF, HTN, AGE, DM2, stroke/tia x 2 PE 2017, CAD, AGE, female)  Per office protocol, patient can hold warfarin for 5 days prior to procedure.   Per Dr. Judeth Cornfield note on 01/15/2019 patient does NOT need lovenox bridge.

## 2019-04-17 NOTE — Telephone Encounter (Signed)
   Primary Cardiologist: Buford Dresser, MD  Chart reviewed as part of pre-operative protocol coverage. Given past medical history and time since last visit, based on ACC/AHA guidelines, CAMAY PEDIGO would be at acceptable risk for the planned procedure without further cardiovascular testing.   Per office protocol, patient can hold warfarin for 5 days prior to procedure.   Per Dr. Judeth Cornfield note on 01/15/2019 patient does NOT need lovenox bridge.  I will route this recommendation to the requesting party via Epic fax function and remove from pre-op pool.  Please call with questions.  Lyda Jester, PA-C 04/17/2019, 1:48 PM

## 2019-04-17 NOTE — Telephone Encounter (Signed)
Called patient to reschedule her colonoscopy she does not want to have the procedure until August  Will put in reminder for patient to schedule in Aug and resend Coumadin letter to cardiologist    Dr Darleen Crocker

## 2019-04-17 NOTE — Telephone Encounter (Signed)
ok 

## 2019-04-17 NOTE — Telephone Encounter (Signed)
 [facility] Pre-operative Risk Assessment     Request for surgical clearance:     Endoscopy Procedure  What type of surgery is being performed?     Colonoscopy  When is this surgery scheduled?     Will be scheduled in August  What type of clearance is required ?   Pharmacy Coumadin  Are there any medications that need to be held prior to surgery and how long? Coumadin 5 days   Practice name and name of physician performing surgery?      [facility] Dr Silverio Decamp  What is your office phone and fax number?      Phone- [phone]  [phone]  Anesthesia type (None, local, MAC, general) ?       MAC

## 2019-04-18 ENCOUNTER — Other Ambulatory Visit: Payer: Self-pay | Admitting: Neurology

## 2019-04-26 DIAGNOSIS — Z79899 Other long term (current) drug therapy: Secondary | ICD-10-CM | POA: Diagnosis not present

## 2019-04-26 DIAGNOSIS — M5136 Other intervertebral disc degeneration, lumbar region: Secondary | ICD-10-CM | POA: Diagnosis not present

## 2019-04-26 DIAGNOSIS — G894 Chronic pain syndrome: Secondary | ICD-10-CM | POA: Diagnosis not present

## 2019-04-26 DIAGNOSIS — Z1159 Encounter for screening for other viral diseases: Secondary | ICD-10-CM | POA: Diagnosis not present

## 2019-05-02 ENCOUNTER — Telehealth: Payer: Self-pay

## 2019-05-02 NOTE — Telephone Encounter (Signed)

## 2019-05-08 DIAGNOSIS — M7062 Trochanteric bursitis, left hip: Secondary | ICD-10-CM | POA: Diagnosis not present

## 2019-05-08 DIAGNOSIS — M7061 Trochanteric bursitis, right hip: Secondary | ICD-10-CM | POA: Diagnosis not present

## 2019-05-09 ENCOUNTER — Ambulatory Visit (INDEPENDENT_AMBULATORY_CARE_PROVIDER_SITE_OTHER): Payer: Medicare Other | Admitting: *Deleted

## 2019-05-09 ENCOUNTER — Other Ambulatory Visit: Payer: Self-pay

## 2019-05-09 DIAGNOSIS — Z7901 Long term (current) use of anticoagulants: Secondary | ICD-10-CM | POA: Diagnosis not present

## 2019-05-09 DIAGNOSIS — Z5181 Encounter for therapeutic drug level monitoring: Secondary | ICD-10-CM

## 2019-05-09 DIAGNOSIS — Z86711 Personal history of pulmonary embolism: Secondary | ICD-10-CM | POA: Diagnosis not present

## 2019-05-09 DIAGNOSIS — I2782 Chronic pulmonary embolism: Secondary | ICD-10-CM

## 2019-05-09 LAB — POCT INR: INR: 3 (ref 2.0–3.0)

## 2019-05-09 NOTE — Patient Instructions (Addendum)
Description   Continue taking 1 tablet daily except 1/2 tablet on Sundays. Please call with date of Colonoscopy once it gets scheduled. Please call your MD if rectal bleeding worsens or seek medical attention. Recheck INR in 6 weeks.

## 2019-05-13 ENCOUNTER — Telehealth: Payer: Self-pay | Admitting: *Deleted

## 2019-05-13 DIAGNOSIS — K625 Hemorrhage of anus and rectum: Secondary | ICD-10-CM

## 2019-05-13 DIAGNOSIS — Z800 Family history of malignant neoplasm of digestive organs: Secondary | ICD-10-CM

## 2019-05-13 NOTE — Telephone Encounter (Signed)
Called patient and scheduled her colonoscopy 06/27/2019 at [place] mail instructions to patient today also discussed holding her blood thinner 5 days prior to procedure

## 2019-05-24 DIAGNOSIS — Z79899 Other long term (current) drug therapy: Secondary | ICD-10-CM | POA: Diagnosis not present

## 2019-05-27 NOTE — Telephone Encounter (Signed)
Follow Up   Patient calling in to discuss date of colonoscopy. Confirmed that the procedure was scheduled for 06/27/19 at 8 am. Also states that she has received the instructions in the mail. Just wanted to call and confirm details. Please give patient a call back.

## 2019-05-27 NOTE — Telephone Encounter (Signed)
I spoke with Debbie [name] and answered her questions. She was actually trying to reach another office.

## 2019-06-11 ENCOUNTER — Other Ambulatory Visit: Payer: Self-pay

## 2019-06-11 ENCOUNTER — Encounter: Payer: Self-pay | Admitting: Family Medicine

## 2019-06-11 ENCOUNTER — Ambulatory Visit (INDEPENDENT_AMBULATORY_CARE_PROVIDER_SITE_OTHER): Payer: Medicare Other | Admitting: Family Medicine

## 2019-06-11 DIAGNOSIS — L304 Erythema intertrigo: Secondary | ICD-10-CM | POA: Diagnosis not present

## 2019-06-11 DIAGNOSIS — I10 Essential (primary) hypertension: Secondary | ICD-10-CM | POA: Diagnosis not present

## 2019-06-11 DIAGNOSIS — K625 Hemorrhage of anus and rectum: Secondary | ICD-10-CM | POA: Diagnosis not present

## 2019-06-11 DIAGNOSIS — Z0000 Encounter for general adult medical examination without abnormal findings: Secondary | ICD-10-CM | POA: Diagnosis not present

## 2019-06-11 DIAGNOSIS — M797 Fibromyalgia: Secondary | ICD-10-CM | POA: Diagnosis not present

## 2019-06-11 DIAGNOSIS — N183 Chronic kidney disease, stage 3 unspecified: Secondary | ICD-10-CM

## 2019-06-11 DIAGNOSIS — F411 Generalized anxiety disorder: Secondary | ICD-10-CM

## 2019-06-11 MED ORDER — NYSTATIN 100000 UNIT/GM EX POWD: Freq: Three times a day (TID) | CUTANEOUS | 2 refills | Status: DC

## 2019-06-11 MED ORDER — NYSTATIN-TRIAMCINOLONE 100000-0.1 UNIT/GM-% EX CREA: 1.0000 "application " | TOPICAL_CREAM | Freq: Every day | CUTANEOUS | 0 refills | Status: DC | PRN
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
## 2019-06-15 MED ORDER — TRIAMCINOLONE ACETONIDE 0.1 % EX CREA: 1.0000 "application " | TOPICAL_CREAM | Freq: Two times a day (BID) | CUTANEOUS | 2 refills | Status: DC | PRN

## 2019-06-18 ENCOUNTER — Ambulatory Visit: Payer: Medicare Other

## 2019-06-18 ENCOUNTER — Telehealth: Payer: Self-pay | Admitting: Family Medicine

## 2019-06-18 NOTE — Telephone Encounter (Signed)
Spoke with patient and gave directions per Dr. [name] for weaning off of Cymbalta. Patient verbalized understanding.

## 2019-06-18 NOTE — Telephone Encounter (Signed)
We just discussed this during her last visit. If she feels like she is having side effects from medication, she can decrease Cymbalta dose from 40 mg to 20 mg daily. In 2 weeks she can decrease Cymbalta from 20 mg daily to 20 mg every other day for 2 weeks, and then every third day for 2 weeks and then stop it. Thanks, BJ

## 2019-06-18 NOTE — Telephone Encounter (Signed)
Copied from [place] [id number]. Topic: Appointment Scheduling - Scheduling Inquiry for Clinic >> Jun 18, 2019  9:58 AM Gerre Pebbles R wrote: Reason for CRM: Pt would like an appt as soon as possible to discuss stopping Cymbalta. She is having severe side effects. Please advise

## 2019-06-18 NOTE — Telephone Encounter (Signed)
Spoke with patient and she stated that she was prescribed Cymbalta for Fibromyalgia but she is feeling really bad and having some side effects and would like to know how to ween off medication. Please advise.

## 2019-06-20 ENCOUNTER — Ambulatory Visit (INDEPENDENT_AMBULATORY_CARE_PROVIDER_SITE_OTHER): Payer: Medicare Other | Admitting: *Deleted

## 2019-06-20 ENCOUNTER — Ambulatory Visit (INDEPENDENT_AMBULATORY_CARE_PROVIDER_SITE_OTHER): Payer: Medicare Other | Admitting: Adult Health

## 2019-06-20 ENCOUNTER — Encounter (INDEPENDENT_AMBULATORY_CARE_PROVIDER_SITE_OTHER): Payer: Self-pay

## 2019-06-20 ENCOUNTER — Other Ambulatory Visit: Payer: Self-pay

## 2019-06-20 ENCOUNTER — Encounter: Payer: Self-pay | Admitting: Adult Health

## 2019-06-20 VITALS — BP 114/62 | HR 59 | Temp 97.7°F | Ht 67.0 in | Wt 237.2 lb

## 2019-06-20 DIAGNOSIS — Z7901 Long term (current) use of anticoagulants: Secondary | ICD-10-CM

## 2019-06-20 DIAGNOSIS — I2782 Chronic pulmonary embolism: Secondary | ICD-10-CM

## 2019-06-20 DIAGNOSIS — Z5181 Encounter for therapeutic drug level monitoring: Secondary | ICD-10-CM | POA: Diagnosis not present

## 2019-06-20 DIAGNOSIS — M797 Fibromyalgia: Secondary | ICD-10-CM

## 2019-06-20 DIAGNOSIS — G4489 Other headache syndrome: Secondary | ICD-10-CM | POA: Diagnosis not present

## 2019-06-20 DIAGNOSIS — Z86711 Personal history of pulmonary embolism: Secondary | ICD-10-CM

## 2019-06-20 LAB — POCT INR: INR: 2.7 (ref 2.0–3.0)

## 2019-06-20 NOTE — Patient Instructions (Signed)
Your Plan:  Continue Lamictal and gabapentin If your symptoms worsen or you develop new symptoms please let us know.   Thank you for coming to see [place] at [facility]. I hope we have been able to provide you high quality care today.  You may receive a patient satisfaction survey over the next few weeks. We would appreciate your feedback and comments so that we may continue to improve ourselves and the health of our patients.

## 2019-06-20 NOTE — Patient Instructions (Addendum)
Description   Continue taking 1 tablet daily except 1/2 tablet on Sundays. Hold Coumadin starting 8/15, resume Coumadin on 8/20 or when directed by the Dr.  Edmonia Lynch INR 1 week post procedure.  Call coumadin clinic [phone] if you have any changes in medications.
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
## 2019-06-23 ENCOUNTER — Other Ambulatory Visit: Payer: Self-pay | Admitting: Cardiology
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
## 2019-06-25 ENCOUNTER — Encounter: Payer: Self-pay | Admitting: Cardiology

## 2019-06-25 ENCOUNTER — Other Ambulatory Visit: Payer: Self-pay

## 2019-06-25 ENCOUNTER — Ambulatory Visit (INDEPENDENT_AMBULATORY_CARE_PROVIDER_SITE_OTHER): Payer: Medicare Other | Admitting: Cardiology

## 2019-06-25 VITALS — BP 135/77 | HR 85 | Temp 97.1°F | Ht 67.0 in | Wt 234.0 lb

## 2019-06-25 DIAGNOSIS — M79604 Pain in right leg: Secondary | ICD-10-CM | POA: Diagnosis not present

## 2019-06-25 DIAGNOSIS — I10 Essential (primary) hypertension: Secondary | ICD-10-CM | POA: Diagnosis not present

## 2019-06-25 DIAGNOSIS — I480 Paroxysmal atrial fibrillation: Secondary | ICD-10-CM | POA: Diagnosis not present

## 2019-06-25 DIAGNOSIS — M79605 Pain in left leg: Secondary | ICD-10-CM

## 2019-06-25 DIAGNOSIS — R600 Localized edema: Secondary | ICD-10-CM | POA: Diagnosis not present

## 2019-06-25 NOTE — Patient Instructions (Signed)

## 2019-06-26 ENCOUNTER — Telehealth: Payer: Self-pay | Admitting: Gastroenterology

## 2019-06-26 NOTE — Telephone Encounter (Signed)

## 2019-06-27 ENCOUNTER — Encounter: Payer: Self-pay | Admitting: Gastroenterology

## 2019-06-27 ENCOUNTER — Ambulatory Visit (AMBULATORY_SURGERY_CENTER): Payer: Medicare Other | Admitting: Gastroenterology

## 2019-06-27 ENCOUNTER — Encounter: Payer: Medicare Other | Admitting: Gastroenterology

## 2019-06-27 ENCOUNTER — Other Ambulatory Visit: Payer: Self-pay

## 2019-06-27 VITALS — BP 120/76 | HR 74 | Temp 97.5°F | Resp 16 | Ht 67.0 in | Wt 237.0 lb

## 2019-06-27 DIAGNOSIS — Z1211 Encounter for screening for malignant neoplasm of colon: Secondary | ICD-10-CM | POA: Diagnosis not present

## 2019-06-27 DIAGNOSIS — I4891 Unspecified atrial fibrillation: Secondary | ICD-10-CM | POA: Diagnosis not present

## 2019-06-27 DIAGNOSIS — Z800 Family history of malignant neoplasm of digestive organs: Secondary | ICD-10-CM

## 2019-06-27 DIAGNOSIS — R569 Unspecified convulsions: Secondary | ICD-10-CM | POA: Diagnosis not present

## 2019-06-27 DIAGNOSIS — I10 Essential (primary) hypertension: Secondary | ICD-10-CM | POA: Diagnosis not present

## 2019-06-27 MED ORDER — SODIUM CHLORIDE 0.9 % IV SOLN: 500.0000 mL | Freq: Once | INTRAVENOUS | Status: DC

## 2019-06-27 NOTE — Op Note (Signed)
[place] Patient Name: Debbie [name] Procedure Date: 06/27/2019 8:16 AM MRN: [id number] Endoscopist: Mauri Pole , MD Age: 73 Referring MD:  Date of Birth: 05/24/1946 Gender: Female Account #: [id number] Procedure:                Colonoscopy Indications:              Screening in patient at increased risk: Family                            history of 1st-degree relative with colorectal                            cancer Medicines:                Monitored Anesthesia Care Procedure:                Pre-Anesthesia Assessment:                           - Prior to the procedure, a History and Physical                            was performed, and patient medications and                            allergies were reviewed. The patient's tolerance of                            previous anesthesia was also reviewed. The risks                            and benefits of the procedure and the sedation                            options and risks were discussed with the patient.                            All questions were answered, and informed consent                            was obtained. Prior Anticoagulants: The patient                            last took Coumadin (warfarin) 5 days prior to the                            procedure. ASA Grade Assessment: III - A patient                            with severe systemic disease. After reviewing the                            risks and benefits, the patient was deemed in  satisfactory condition to undergo the procedure.                           After obtaining informed consent, the colonoscope                            was passed under direct vision. Throughout the                            procedure, the patient's blood pressure, pulse, and                            oxygen saturations were monitored continuously. The                            Colonoscope was introduced through the anus and                             advanced to the the cecum, identified by                            appendiceal orifice and ileocecal valve. The                            colonoscopy was performed without difficulty. The                            patient tolerated the procedure well. The quality                            of the bowel preparation was good. The ileocecal                            valve, appendiceal orifice, and rectum were                            photographed. Scope In: 8:28:21 AM Scope Out: 8:43:18 AM Scope Withdrawal Time: 0 hours 8 minutes 56 seconds  Total Procedure Duration: 0 hours 14 minutes 57 seconds  Findings:                 The perianal and digital rectal examinations were                            normal.                           Scattered small and large-mouthed diverticula were                            found in the sigmoid colon, descending colon,                            transverse colon and ascending colon.  Non-bleeding internal hemorrhoids were found during                            retroflexion. The hemorrhoids were small.                           The exam was otherwise without abnormality. Complications:            No immediate complications. Estimated Blood Loss:     Estimated blood loss: none. Impression:               - Moderate diverticulosis in the sigmoid colon, in                            the descending colon, in the transverse colon and                            in the ascending colon.                           - Non-bleeding internal hemorrhoids.                           - The examination was otherwise normal.                           - No specimens collected. Recommendation:           - Patient has a contact number available for                            emergencies. The signs and symptoms of potential                            delayed complications were discussed with the                             patient. Return to normal activities tomorrow.                            Written discharge instructions were provided to the                            patient.                           - Resume previous diet.                           - Continue present medications.                           - Resume Coumadin (warfarin) at prior dose today.                            Refer to Coumadin Clinic for further adjustment of  therapy.                           - No repeat colonoscopy due to age.                           - Return to GI clinic PRN. Mauri Pole, MD 06/27/2019 8:52:14 AM This report has been signed electronically.

## 2019-06-27 NOTE — Patient Instructions (Signed)
Please read handouts provided. Continue present medications. Resume Coumadin (warfarin) at prior dose today.      YOU HAD AN ENDOSCOPIC PROCEDURE TODAY AT [facility]:   Refer to the procedure report that was given to you for any specific questions about what was found during the examination.  If the procedure report does not answer your questions, please call your gastroenterologist to clarify.  If you requested that your care partner not be given the details of your procedure findings, then the procedure report has been included in a sealed envelope for you to review at your convenience later.  YOU SHOULD EXPECT: Some feelings of bloating in the abdomen. Passage of more gas than usual.  Walking can help get rid of the air that was put into your GI tract during the procedure and reduce the bloating. If you had a lower endoscopy (such as a colonoscopy or flexible sigmoidoscopy) you may notice spotting of blood in your stool or on the toilet paper. If you underwent a bowel prep for your procedure, you may not have a normal bowel movement for a few days.  Please Note:  You might notice some irritation and congestion in your nose or some drainage.  This is from the oxygen used during your procedure.  There is no need for concern and it should clear up in a day or so.  SYMPTOMS TO REPORT IMMEDIATELY:   Following lower endoscopy (colonoscopy or flexible sigmoidoscopy):  Excessive amounts of blood in the stool  Significant tenderness or worsening of abdominal pains  Swelling of the abdomen that is new, acute  Fever of 100F or higher    For urgent or emergent issues, a gastroenterologist can be reached at any hour by calling [phone].   DIET:  We do recommend a small meal at first, but then you may proceed to your regular diet.  Drink plenty of fluids but you should avoid alcoholic beverages for 24 hours.  ACTIVITY:  You should plan to take it easy for the rest of today  and you should NOT DRIVE or use heavy machinery until tomorrow (because of the sedation medicines used during the test).    FOLLOW UP: Our staff will call the number listed on your records 48-72 hours following your procedure to check on you and address any questions or concerns that you may have regarding the information given to you following your procedure. If we do not reach you, we will leave a message.  We will attempt to reach you two times.  During this call, we will ask if you have developed any symptoms of COVID 19. If you develop any symptoms (ie: fever, flu-like symptoms, shortness of breath, cough etc.) before then, please call [phone].  If you test positive for Covid 19 in the 2 weeks post procedure, please call and report this information to [place].    If any biopsies were taken you will be contacted by phone or by letter within the next 1-3 weeks.  Please call us at [phone] if you have not heard about the biopsies in 3 weeks.    SIGNATURES/CONFIDENTIALITY: You and/or your care partner have signed paperwork which will be entered into your electronic medical record.  These signatures attest to the fact that that the information above on your After Visit Summary has been reviewed and is understood.  Full responsibility of the confidentiality of this discharge information lies with you and/or your care-partner.

## 2019-06-27 NOTE — Progress Notes (Signed)
Experienced N/V at 0835, no desat noted, vss

## 2019-06-27 NOTE — Progress Notes (Signed)
Report given to PACU, vss 

## 2019-07-01 ENCOUNTER — Telehealth: Payer: Self-pay | Admitting: *Deleted

## 2019-07-01 DIAGNOSIS — Z79899 Other long term (current) drug therapy: Secondary | ICD-10-CM | POA: Diagnosis not present

## 2019-07-01 DIAGNOSIS — Z1159 Encounter for screening for other viral diseases: Secondary | ICD-10-CM | POA: Diagnosis not present

## 2019-07-01 DIAGNOSIS — G894 Chronic pain syndrome: Secondary | ICD-10-CM | POA: Diagnosis not present

## 2019-07-01 DIAGNOSIS — Z760 Encounter for issue of repeat prescription: Secondary | ICD-10-CM | POA: Diagnosis not present

## 2019-07-01 DIAGNOSIS — M5136 Other intervertebral disc degeneration, lumbar region: Secondary | ICD-10-CM | POA: Diagnosis not present

## 2019-07-01 NOTE — Telephone Encounter (Signed)
  Follow up Call-  Call back number 06/27/2019 11/22/2016  Post procedure Call Back phone  # [phone]  Permission to leave phone message Yes Yes  Some recent data might be hidden     Patient questions:  Do you have a fever, pain , or abdominal swelling? No. Pain Score  0 *  Have you tolerated food without any problems? Yes.    Have you been able to return to your normal activities? Yes.    Do you have any questions about your discharge instructions: Diet   No. Medications  No. Follow up visit  No.  Do you have questions or concerns about your Care? No.  Actions: * If pain score is 4 or above: No action needed, pain <4. Patient stated that she had pain over the weekend.  It has "eased up."  I asked her why she hadn't called [place], and she never did answer me.  She did not give me a pain score.  She was told to walk around, and get the gas out.  She was also told to call us if the pain got worse.  1. Have you developed a fever since your procedure? no  2.   Have you had an respiratory symptoms (SOB or cough) since your procedure? no  3.   Have you tested positive for COVID 19 since your procedure no  4.   Have you had any family members/close contacts diagnosed with the COVID 19 since your procedure?  no   If yes to any of these questions please route to Joylene John, RN and Alphonsa Gin, Therapist, sports.

## 2019-07-05 ENCOUNTER — Other Ambulatory Visit: Payer: Self-pay

## 2019-07-05 ENCOUNTER — Ambulatory Visit (INDEPENDENT_AMBULATORY_CARE_PROVIDER_SITE_OTHER): Payer: Medicare Other | Admitting: *Deleted

## 2019-07-05 DIAGNOSIS — Z7901 Long term (current) use of anticoagulants: Secondary | ICD-10-CM | POA: Diagnosis not present

## 2019-07-05 DIAGNOSIS — Z86711 Personal history of pulmonary embolism: Secondary | ICD-10-CM | POA: Diagnosis not present

## 2019-07-05 DIAGNOSIS — Z5181 Encounter for therapeutic drug level monitoring: Secondary | ICD-10-CM | POA: Diagnosis not present

## 2019-07-05 DIAGNOSIS — I2782 Chronic pulmonary embolism: Secondary | ICD-10-CM

## 2019-07-05 LAB — POCT INR: INR: 2.3 (ref 2.0–3.0)

## 2019-07-05 NOTE — Patient Instructions (Signed)
Description   Continue taking 1 tablet daily except 1/2 tablet on Sundays. Recheck INR 2 weeks.  Call coumadin clinic [phone] if you have any changes in medications.

## 2019-07-11 ENCOUNTER — Other Ambulatory Visit: Payer: Self-pay | Admitting: Neurology

## 2019-07-19 ENCOUNTER — Other Ambulatory Visit: Payer: Self-pay

## 2019-07-19 ENCOUNTER — Ambulatory Visit (INDEPENDENT_AMBULATORY_CARE_PROVIDER_SITE_OTHER): Payer: Medicare Other | Admitting: *Deleted

## 2019-07-19 DIAGNOSIS — Z86711 Personal history of pulmonary embolism: Secondary | ICD-10-CM

## 2019-07-19 DIAGNOSIS — Z5181 Encounter for therapeutic drug level monitoring: Secondary | ICD-10-CM | POA: Diagnosis not present

## 2019-07-19 DIAGNOSIS — Z7901 Long term (current) use of anticoagulants: Secondary | ICD-10-CM

## 2019-07-19 LAB — POCT INR: INR: 3.4 — AB (ref 2.0–3.0)

## 2019-07-19 NOTE — Patient Instructions (Signed)
Description   Hold today, then continue taking 1 tablet daily except 1/2 tablet on Sundays. Recheck INR 2 weeks.  Call coumadin clinic [phone] if you have any changes in medications.

## 2019-07-31 ENCOUNTER — Ambulatory Visit: Payer: Self-pay

## 2019-07-31 DIAGNOSIS — M5136 Other intervertebral disc degeneration, lumbar region: Secondary | ICD-10-CM | POA: Diagnosis not present

## 2019-07-31 DIAGNOSIS — G894 Chronic pain syndrome: Secondary | ICD-10-CM | POA: Diagnosis not present

## 2019-07-31 DIAGNOSIS — Z79899 Other long term (current) drug therapy: Secondary | ICD-10-CM | POA: Diagnosis not present

## 2019-07-31 DIAGNOSIS — Z23 Encounter for immunization: Secondary | ICD-10-CM | POA: Diagnosis not present

## 2019-07-31 NOTE — Telephone Encounter (Signed)
  Pt. Reports she has had shortness of breath with exertion "for a long time now and it seems to be getting worse." Has noticed swelling in her ankles as well. States she saw her cardiologist and he did "not think it was related to my heart." Request an appointment with Dr. [name]. Tammy in the practice states pt. Should go to UC. Instructed her pt. Does not have shortness of breath now, but with exertion and that she does not have chest pain now. Pt. Refuses UC and states she will call her cardiologist for assistance.  Answer Assessment - Initial Assessment Questions 1. RESPIRATORY STATUS: "Describe your breathing?" (e.g., wheezing, shortness of breath, unable to speak, severe coughing)      Shortness of breath 2. ONSET: "When did this breathing problem begin?"      A long time ago 3. PATTERN "Does the difficult breathing come and go, or has it been constant since it started?"      With exertion 4. SEVERITY: "How bad is your breathing?" (e.g., mild, moderate, severe)    - MILD: No SOB at rest, mild SOB with walking, speaks normally in sentences, can lay down, no retractions, pulse < 100.    - MODERATE: SOB at rest, SOB with minimal exertion and prefers to sit, cannot lie down flat, speaks in phrases, mild retractions, audible wheezing, pulse 100-120.    - SEVERE: Very SOB at rest, speaks in single words, struggling to breathe, sitting hunched forward, retractions, pulse > 120      Mild to moderate 5. RECURRENT SYMPTOM: "Have you had difficulty breathing before?" If so, ask: "When was the last time?" and "What happened that time?"      No 6. CARDIAC HISTORY: "Do you have any history of heart disease?" (e.g., heart attack, angina, bypass surgery, angioplasty)      A fib 7. LUNG HISTORY: "Do you have any history of lung disease?"  (e.g., pulmonary embolus, asthma, emphysema)     Pneumonia, 2018 PE 8. CAUSE: "What do you think is causing the breathing problem?"      Unsure 9. OTHER SYMPTOMS: "Do  you have any other symptoms? (e.g., dizziness, runny nose, cough, chest pain, fever)     Chest tightness with the SOB 10. PREGNANCY: "Is there any chance you are pregnant?" "When was your last menstrual period?"       No 11. TRAVEL: "Have you traveled out of the country in the last month?" (e.g., travel history, exposures)       No  Protocols used: BREATHING DIFFICULTY-A-AH

## 2019-07-31 NOTE — Telephone Encounter (Signed)
Message sent to Dr. Jordan for review. 

## 2019-08-02 ENCOUNTER — Ambulatory Visit (INDEPENDENT_AMBULATORY_CARE_PROVIDER_SITE_OTHER): Payer: Medicare Other | Admitting: *Deleted

## 2019-08-02 ENCOUNTER — Other Ambulatory Visit: Payer: Self-pay

## 2019-08-02 DIAGNOSIS — Z5181 Encounter for therapeutic drug level monitoring: Secondary | ICD-10-CM

## 2019-08-02 DIAGNOSIS — Z86711 Personal history of pulmonary embolism: Secondary | ICD-10-CM

## 2019-08-02 DIAGNOSIS — Z7901 Long term (current) use of anticoagulants: Secondary | ICD-10-CM

## 2019-08-02 LAB — POCT INR: INR: 2.9 (ref 2.0–3.0)

## 2019-08-02 NOTE — Patient Instructions (Signed)
Description    Continue taking 1 tablet daily except 1/2 tablet on Sundays. Recheck INR 3 weeks.  Call coumadin clinic [phone] if you have any changes in medications.

## 2019-08-02 NOTE — Telephone Encounter (Signed)
If this a new problem and/or it is getting worse she needs to seek immediate medical attention; otherwise we can see her next week. Monitor for fever and for new associated symptoms. Thanks, BJ

## 2019-08-02 NOTE — Telephone Encounter (Signed)
Patient given recommendations per Dr. [name] and verbalized understanding. Patient scheduled ov for 08/06/2019.

## 2019-08-06 ENCOUNTER — Ambulatory Visit: Payer: Medicare Other | Admitting: Family Medicine

## 2019-08-20 IMAGING — CR DG KNEE 1-2V*L*
2 series · 2 of 2 positions shown · non-contrast
Comparison: None.

CLINICAL DATA: Pain for 6 months below and to either side of
patella. Swelling medial area

EXAM:
LEFT KNEE - 1-2 VIEW

[knee ap]
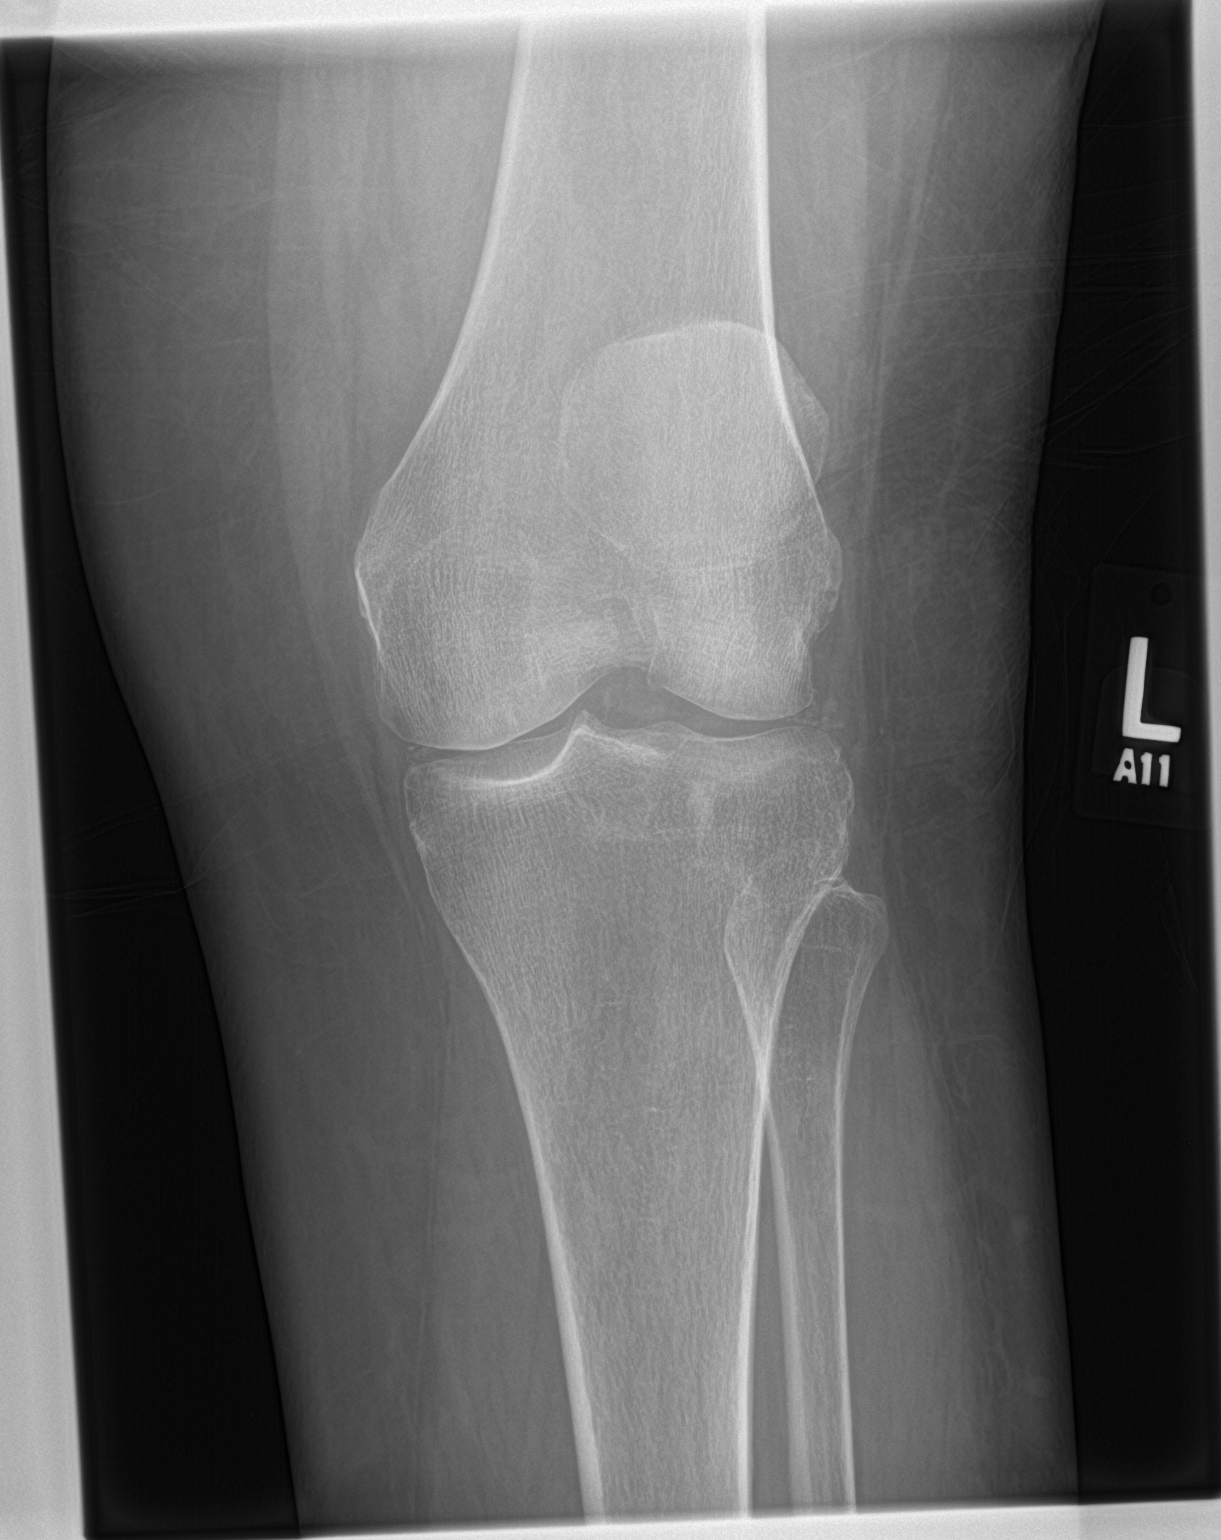

[knee lat]
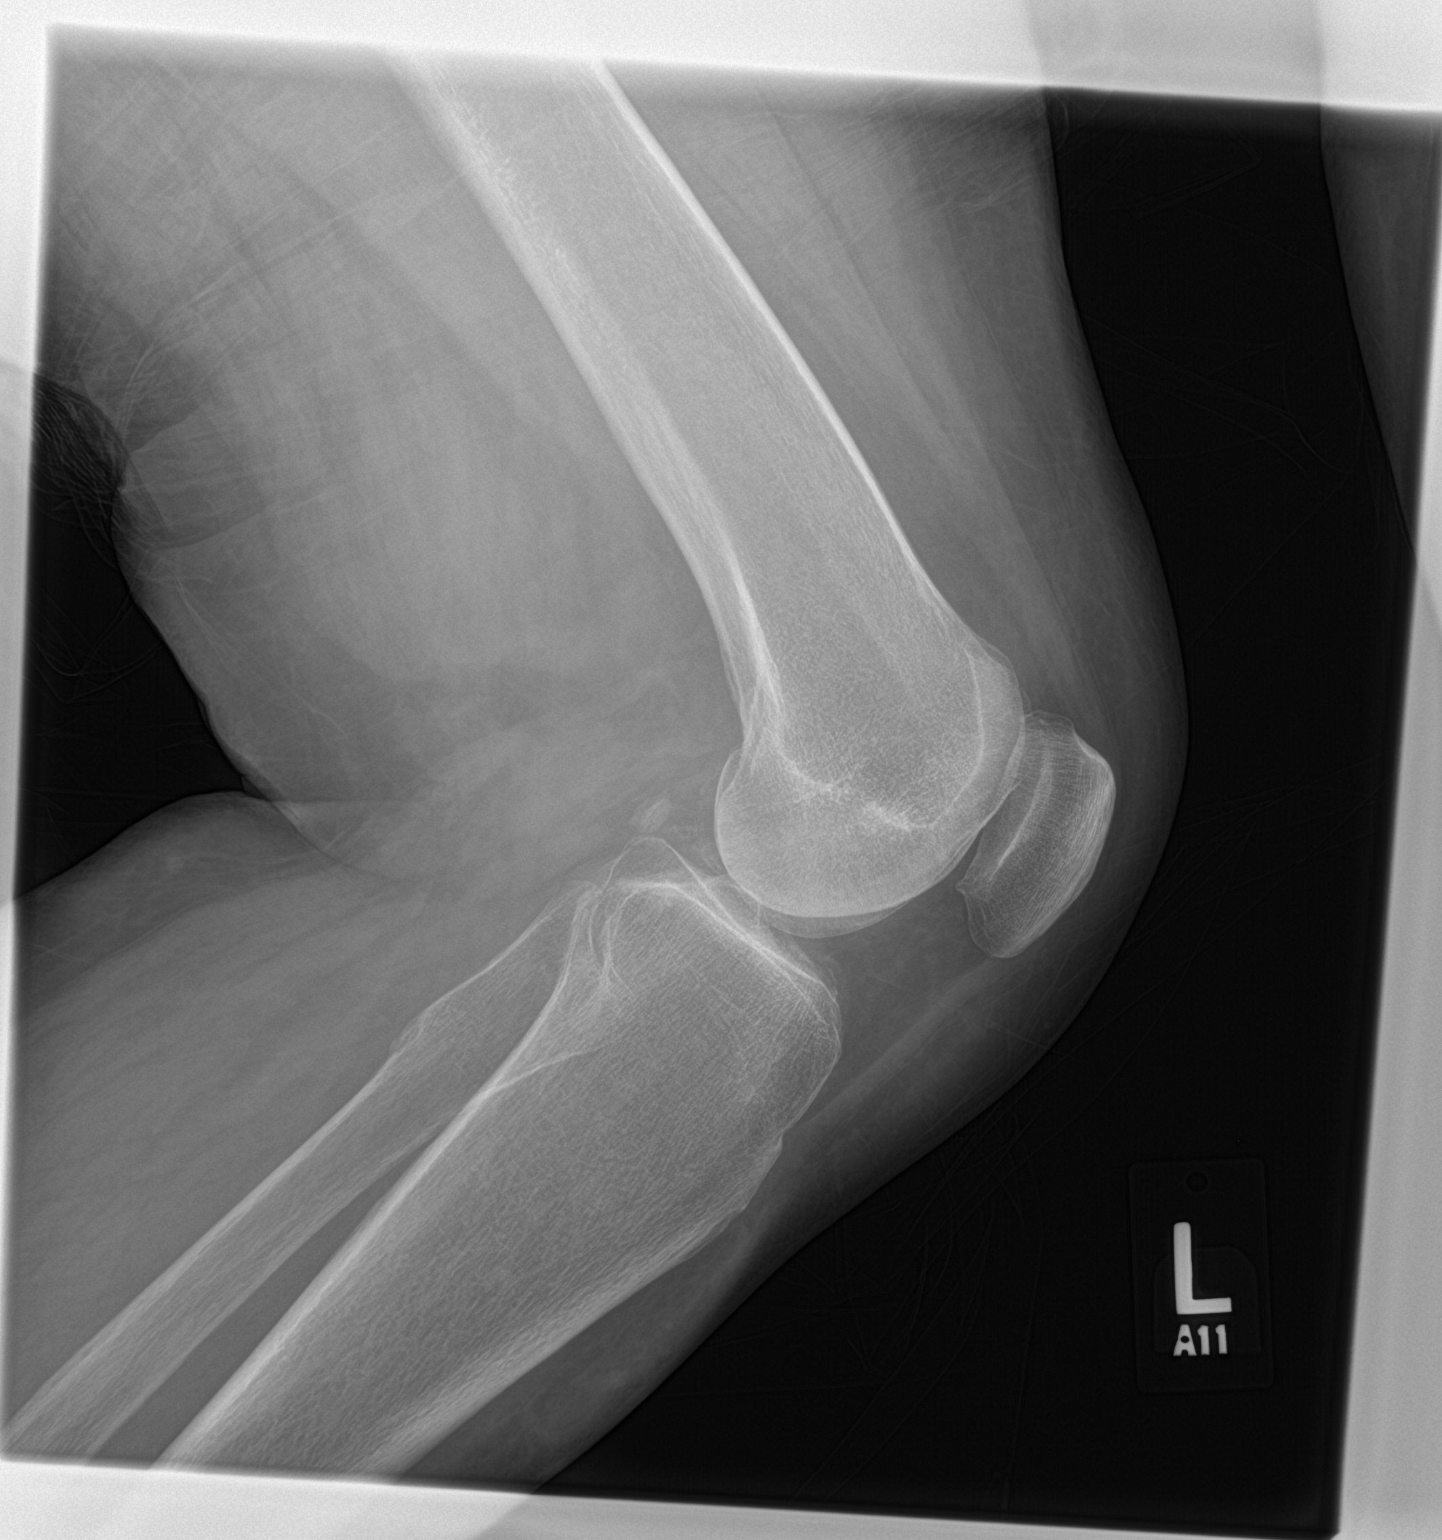

[2 of 2 positions shown; findings below may reference images not displayed]

FINDINGS: There is mild chondrocalcinosis. No joint effusion or acute
fracture. No suspicious lytic or blastic lesions are identified.
IMPRESSION: Mild chondrocalcinosis.  No evidence for acute  abnormality.

## 2019-08-23 ENCOUNTER — Other Ambulatory Visit: Payer: Self-pay

## 2019-08-23 ENCOUNTER — Ambulatory Visit (INDEPENDENT_AMBULATORY_CARE_PROVIDER_SITE_OTHER): Payer: Medicare Other | Admitting: *Deleted

## 2019-08-23 DIAGNOSIS — Z5181 Encounter for therapeutic drug level monitoring: Secondary | ICD-10-CM

## 2019-08-23 DIAGNOSIS — Z86711 Personal history of pulmonary embolism: Secondary | ICD-10-CM | POA: Diagnosis not present

## 2019-08-23 DIAGNOSIS — Z7901 Long term (current) use of anticoagulants: Secondary | ICD-10-CM

## 2019-08-23 LAB — POCT INR: INR: 2.9 (ref 2.0–3.0)

## 2019-08-23 NOTE — Patient Instructions (Signed)
Description    Continue taking 1 tablet daily except 1/2 tablet on Sundays. Recheck INR 4 weeks.  Call coumadin clinic [phone] if you have any changes in medications.

## 2019-08-26 DIAGNOSIS — M7062 Trochanteric bursitis, left hip: Secondary | ICD-10-CM | POA: Diagnosis not present

## 2019-08-26 DIAGNOSIS — M7061 Trochanteric bursitis, right hip: Secondary | ICD-10-CM | POA: Diagnosis not present

## 2019-08-30 ENCOUNTER — Other Ambulatory Visit: Payer: Self-pay | Admitting: Pharmacist Clinician (PhC)/ Clinical Pharmacy Specialist

## 2019-08-30 MED ORDER — WARFARIN SODIUM 5 MG PO TABS: ORAL_TABLET | ORAL | 1 refills | Status: DC

## 2019-09-02 ENCOUNTER — Telehealth (INDEPENDENT_AMBULATORY_CARE_PROVIDER_SITE_OTHER): Payer: Medicare Other | Admitting: Family Medicine

## 2019-09-02 ENCOUNTER — Encounter: Payer: Self-pay | Admitting: Family Medicine

## 2019-09-02 VITALS — BP 112/58 | HR 56 | Ht 67.0 in

## 2019-09-02 DIAGNOSIS — H9122 Sudden idiopathic hearing loss, left ear: Secondary | ICD-10-CM | POA: Diagnosis not present

## 2019-09-02 DIAGNOSIS — I129 Hypertensive chronic kidney disease with stage 1 through stage 4 chronic kidney disease, or unspecified chronic kidney disease: Secondary | ICD-10-CM

## 2019-09-02 DIAGNOSIS — N182 Chronic kidney disease, stage 2 (mild): Secondary | ICD-10-CM

## 2019-09-02 DIAGNOSIS — R42 Dizziness and giddiness: Secondary | ICD-10-CM

## 2019-09-02 DIAGNOSIS — H9312 Tinnitus, left ear: Secondary | ICD-10-CM

## 2019-09-02 MED ORDER — MECLIZINE HCL 25 MG PO TABS: 25.0000 mg | ORAL_TABLET | Freq: Two times a day (BID) | ORAL | 0 refills | Status: AC | PRN
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
## 2019-09-04 ENCOUNTER — Telehealth: Payer: Self-pay | Admitting: *Deleted

## 2019-09-04 ENCOUNTER — Telehealth: Payer: Self-pay | Admitting: Pharmacist

## 2019-09-04 DIAGNOSIS — R42 Dizziness and giddiness: Secondary | ICD-10-CM | POA: Diagnosis not present

## 2019-09-04 DIAGNOSIS — H6123 Impacted cerumen, bilateral: Secondary | ICD-10-CM | POA: Diagnosis not present

## 2019-09-04 DIAGNOSIS — H9123 Sudden idiopathic hearing loss, bilateral: Secondary | ICD-10-CM | POA: Diagnosis not present

## 2019-09-04 DIAGNOSIS — H8302 Labyrinthitis, left ear: Secondary | ICD-10-CM | POA: Diagnosis not present

## 2019-09-04 DIAGNOSIS — H9312 Tinnitus, left ear: Secondary | ICD-10-CM | POA: Diagnosis not present

## 2019-09-04 DIAGNOSIS — H903 Sensorineural hearing loss, bilateral: Secondary | ICD-10-CM | POA: Diagnosis not present

## 2019-09-04 NOTE — Telephone Encounter (Signed)
Patient called stating she accidently took 2 coumadin tablets last night instead of 1. Advised to skip dose tonight and resume regular dose on Thursday.

## 2019-09-04 NOTE — Telephone Encounter (Signed)
Left message on machine returning patient's call CRM  General 09/03/2019 10:24 AM Rudean Curt Mora Bellman - -  Note   Faxed referral via release  Dr. Leonides Sake. Lucia Gaskins, MD Address: [address], [place], [place] [address], [place], [place] [address] Phone: [phone]

## 2019-09-04 NOTE — Telephone Encounter (Signed)
Copied from [place] [id number]. Topic: General - Other >> Sep 04, 2019  8:31 AM Leward Quan A wrote: Reason for CRM: Patient called to request information on the referral to an ENT say that it was an emergency and she is asking for a call from the nurse to discuss the process. Please call patient at Ph# [phone]

## 2019-09-04 NOTE — Telephone Encounter (Signed)
Patient has had sudden hearing loss and will be starting a 2 week prednisone taper today. Starting with prednisone 60mg . Spoke with ENT PA and patient. Will bring patient in Friday to have her INR checked due to drug drug interaction ( a little early, but because of the weekend, prefer to check her earlier rather than later)

## 2019-09-05 ENCOUNTER — Telehealth: Payer: Self-pay | Admitting: *Deleted

## 2019-09-05 NOTE — Telephone Encounter (Signed)
Copied from [place] [id number] [id number]. Topic: General - Other >> Sep 04, 2019  3:41 PM Mcneil, Ja-Kwan wrote: Reason for CRM: Pt returned call to the office stating she saw Dr. Pollie Friar PA due to total loss of hearing and she was prescribed a steroid that can possibly restore the hearing 50%-80%.

## 2019-09-06 ENCOUNTER — Other Ambulatory Visit: Payer: Self-pay

## 2019-09-06 ENCOUNTER — Ambulatory Visit (INDEPENDENT_AMBULATORY_CARE_PROVIDER_SITE_OTHER): Payer: Medicare Other | Admitting: *Deleted

## 2019-09-06 DIAGNOSIS — Z5181 Encounter for therapeutic drug level monitoring: Secondary | ICD-10-CM | POA: Diagnosis not present

## 2019-09-06 DIAGNOSIS — Z7901 Long term (current) use of anticoagulants: Secondary | ICD-10-CM

## 2019-09-06 DIAGNOSIS — I2782 Chronic pulmonary embolism: Secondary | ICD-10-CM

## 2019-09-06 DIAGNOSIS — Z86711 Personal history of pulmonary embolism: Secondary | ICD-10-CM | POA: Diagnosis not present

## 2019-09-06 LAB — POCT INR: INR: 2.8 (ref 2.0–3.0)

## 2019-09-06 NOTE — Telephone Encounter (Signed)
Noted. FYI 

## 2019-09-06 NOTE — Patient Instructions (Signed)
Description   Today take 1/2 tablet then continue taking 1 tablet daily except 1/2 tablet on Sundays. Recheck INR on Tuesday.  Call coumadin clinic [phone] if you have any changes in medications.

## 2019-09-07 ENCOUNTER — Encounter (INDEPENDENT_AMBULATORY_CARE_PROVIDER_SITE_OTHER): Payer: Self-pay

## 2019-09-10 ENCOUNTER — Other Ambulatory Visit: Payer: Self-pay

## 2019-09-10 ENCOUNTER — Ambulatory Visit (INDEPENDENT_AMBULATORY_CARE_PROVIDER_SITE_OTHER): Payer: Medicare Other | Admitting: *Deleted

## 2019-09-10 DIAGNOSIS — Z5181 Encounter for therapeutic drug level monitoring: Secondary | ICD-10-CM | POA: Diagnosis not present

## 2019-09-10 DIAGNOSIS — Z86711 Personal history of pulmonary embolism: Secondary | ICD-10-CM | POA: Diagnosis not present

## 2019-09-10 DIAGNOSIS — Z7901 Long term (current) use of anticoagulants: Secondary | ICD-10-CM | POA: Diagnosis not present

## 2019-09-10 LAB — POCT INR
INR: 2.5 (ref 2.0–3.0)
INR: 2.5 (ref 2.0–3.0)

## 2019-09-10 NOTE — Patient Instructions (Signed)
Description   Continue taking 1 tablet daily except 1/2 tablet on Sundays. Recheck INR in 1 week. Eat some extra greens this week.  Call coumadin clinic [phone] if you have any changes in medications.

## 2019-09-11 NOTE — Telephone Encounter (Signed)
Noted. BJ 

## 2019-09-13 DIAGNOSIS — G894 Chronic pain syndrome: Secondary | ICD-10-CM | POA: Diagnosis not present

## 2019-09-13 DIAGNOSIS — M5136 Other intervertebral disc degeneration, lumbar region: Secondary | ICD-10-CM | POA: Diagnosis not present

## 2019-09-13 DIAGNOSIS — Z79899 Other long term (current) drug therapy: Secondary | ICD-10-CM | POA: Diagnosis not present

## 2019-09-17 ENCOUNTER — Ambulatory Visit (INDEPENDENT_AMBULATORY_CARE_PROVIDER_SITE_OTHER): Payer: Medicare Other | Admitting: *Deleted

## 2019-09-17 ENCOUNTER — Other Ambulatory Visit: Payer: Self-pay

## 2019-09-17 DIAGNOSIS — Z86711 Personal history of pulmonary embolism: Secondary | ICD-10-CM | POA: Diagnosis not present

## 2019-09-17 DIAGNOSIS — Z7901 Long term (current) use of anticoagulants: Secondary | ICD-10-CM | POA: Diagnosis not present

## 2019-09-17 DIAGNOSIS — Z5181 Encounter for therapeutic drug level monitoring: Secondary | ICD-10-CM

## 2019-09-17 DIAGNOSIS — I2782 Chronic pulmonary embolism: Secondary | ICD-10-CM

## 2019-09-17 LAB — POCT INR: INR: 3.7 — AB (ref 2.0–3.0)

## 2019-09-17 NOTE — Patient Instructions (Signed)
Description   Do not take any Warfarin today then continue taking 1 tablet daily except 1/2 tablet on Sundays. Recheck INR in 2 weeks. Eat some extra greens today and remain consistent.  Call coumadin clinic [phone] if you have any changes in medications.

## 2019-09-18 ENCOUNTER — Ambulatory Visit (INDEPENDENT_AMBULATORY_CARE_PROVIDER_SITE_OTHER): Payer: Medicare Other | Admitting: Otolaryngology

## 2019-09-18 ENCOUNTER — Encounter (INDEPENDENT_AMBULATORY_CARE_PROVIDER_SITE_OTHER): Payer: Self-pay | Admitting: Otolaryngology

## 2019-09-18 VITALS — Temp 97.2°F

## 2019-09-18 DIAGNOSIS — H9120 Sudden idiopathic hearing loss, unspecified ear: Secondary | ICD-10-CM | POA: Diagnosis not present

## 2019-09-18 DIAGNOSIS — H9122 Sudden idiopathic hearing loss, left ear: Secondary | ICD-10-CM | POA: Diagnosis not present
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
## 2019-09-20 ENCOUNTER — Encounter (INDEPENDENT_AMBULATORY_CARE_PROVIDER_SITE_OTHER): Payer: Self-pay

## 2019-09-30 ENCOUNTER — Ambulatory Visit (INDEPENDENT_AMBULATORY_CARE_PROVIDER_SITE_OTHER): Payer: Medicare Other | Admitting: Pharmacist

## 2019-09-30 ENCOUNTER — Other Ambulatory Visit: Payer: Self-pay

## 2019-09-30 DIAGNOSIS — I2782 Chronic pulmonary embolism: Secondary | ICD-10-CM

## 2019-09-30 DIAGNOSIS — Z86711 Personal history of pulmonary embolism: Secondary | ICD-10-CM

## 2019-09-30 DIAGNOSIS — Z5181 Encounter for therapeutic drug level monitoring: Secondary | ICD-10-CM | POA: Diagnosis not present

## 2019-09-30 DIAGNOSIS — Z7901 Long term (current) use of anticoagulants: Secondary | ICD-10-CM

## 2019-09-30 LAB — POCT INR: INR: 4.6 — AB (ref 2.0–3.0)

## 2019-09-30 NOTE — Patient Instructions (Signed)
Description   Do not take any Warfarin today or tomorrow then taking 1 tablet daily except 1/2 tablet on Sundays and Thursdays. Patient to stop taking dietary supplement that patient started about a month ago for ringing in ear. Recheck INR in 1 weeks. Eat some extra greens today and drink Boost. Remain consistent with Vitamin K intake.  Call coumadin clinic [phone] if you have any changes in medications.

## 2019-10-08 ENCOUNTER — Other Ambulatory Visit: Payer: Self-pay

## 2019-10-08 ENCOUNTER — Ambulatory Visit (INDEPENDENT_AMBULATORY_CARE_PROVIDER_SITE_OTHER): Payer: Medicare Other | Admitting: *Deleted

## 2019-10-08 DIAGNOSIS — Z86711 Personal history of pulmonary embolism: Secondary | ICD-10-CM | POA: Diagnosis not present

## 2019-10-08 DIAGNOSIS — I2782 Chronic pulmonary embolism: Secondary | ICD-10-CM | POA: Diagnosis not present

## 2019-10-08 DIAGNOSIS — Z5181 Encounter for therapeutic drug level monitoring: Secondary | ICD-10-CM

## 2019-10-08 DIAGNOSIS — Z7901 Long term (current) use of anticoagulants: Secondary | ICD-10-CM

## 2019-10-08 LAB — POCT INR: INR: 2.2 (ref 2.0–3.0)

## 2019-10-08 NOTE — Patient Instructions (Signed)
Description   Continue taking 1 tablet daily except 1/2 tablet on Sundays and Thursdays. Recheck INR in 2 weeks. Remain consistent with Vitamin K intake and Boost.  Call Coumadin Clinic [phone] if you have any changes in medications.

## 2019-10-17 DIAGNOSIS — Z79899 Other long term (current) drug therapy: Secondary | ICD-10-CM | POA: Diagnosis not present

## 2019-10-17 DIAGNOSIS — M5136 Other intervertebral disc degeneration, lumbar region: Secondary | ICD-10-CM | POA: Diagnosis not present

## 2019-10-17 DIAGNOSIS — G894 Chronic pain syndrome: Secondary | ICD-10-CM | POA: Diagnosis not present

## 2019-10-22 ENCOUNTER — Ambulatory Visit (INDEPENDENT_AMBULATORY_CARE_PROVIDER_SITE_OTHER): Payer: Medicare Other | Admitting: *Deleted

## 2019-10-22 ENCOUNTER — Other Ambulatory Visit: Payer: Self-pay

## 2019-10-22 DIAGNOSIS — Z7901 Long term (current) use of anticoagulants: Secondary | ICD-10-CM | POA: Diagnosis not present

## 2019-10-22 DIAGNOSIS — Z86711 Personal history of pulmonary embolism: Secondary | ICD-10-CM | POA: Diagnosis not present

## 2019-10-22 DIAGNOSIS — Z5181 Encounter for therapeutic drug level monitoring: Secondary | ICD-10-CM

## 2019-10-22 LAB — POCT INR: INR: 2.4 (ref 2.0–3.0)

## 2019-10-22 NOTE — Patient Instructions (Signed)
Description   Continue taking 1 tablet daily except 1/2 tablet on Sundays and Thursdays. Recheck INR in 3 weeks.  Call [facility] [phone] if you have any changes in medications.

## 2019-10-25 ENCOUNTER — Telehealth: Payer: Self-pay | Admitting: Cardiology

## 2019-10-25 NOTE — Telephone Encounter (Signed)
Pt updated and verbalized understanding.  

## 2019-10-25 NOTE — Telephone Encounter (Signed)
Spoke with pt who report for the past month she has been experiencing swelling in her legs and feet that has worsened over the past few days. She also report, the last 3-4 days she's noticed swelling in her arms and hands that is worse at night and very painful. She report some SOB but within her norm and chest pressure while laying on left side but relieved with position change.  Appointment scheduled for 12/22 to better asess but will route to MD for any further recommendations.

## 2019-10-25 NOTE — Telephone Encounter (Signed)
Please ask her to take weights daily as well so that we can further assess. If she has severe shortness of breath between now and the appt she should present to ER for evaluation. Thanks.

## 2019-10-25 NOTE — Telephone Encounter (Signed)
New Message  Pt c/o swelling: STAT is pt has developed SOB within 24 hours  1) How much weight have you gained and in what time span? 1-2 overnight  2) If swelling, where is the swelling located? Hands, legs, feet and ankle  3) Are you currently taking a fluid pill? No  4) Are you currently SOB? No, but with Afib and exertion yes.  5) Do you have a log of your daily weights (if so, list)? 230, 231.8, 232, 231, 230  6) Have you gained 3 pounds in a day or 5 pounds in a week? No  7) Have you traveled recently? No

## 2019-10-26 ENCOUNTER — Other Ambulatory Visit: Payer: Self-pay | Admitting: Family Medicine

## 2019-10-26 DIAGNOSIS — E559 Vitamin D deficiency, unspecified: Secondary | ICD-10-CM

## 2019-10-27 ENCOUNTER — Other Ambulatory Visit: Payer: Self-pay | Admitting: Neurology

## 2019-10-29 ENCOUNTER — Encounter: Payer: Self-pay | Admitting: Cardiology

## 2019-10-29 ENCOUNTER — Other Ambulatory Visit: Payer: Self-pay

## 2019-10-29 ENCOUNTER — Ambulatory Visit (INDEPENDENT_AMBULATORY_CARE_PROVIDER_SITE_OTHER): Payer: Medicare Other | Admitting: Cardiology

## 2019-10-29 VITALS — BP 123/65 | HR 79 | Temp 97.5°F | Ht 68.0 in | Wt 231.4 lb

## 2019-10-29 DIAGNOSIS — I480 Paroxysmal atrial fibrillation: Secondary | ICD-10-CM

## 2019-10-29 DIAGNOSIS — R0602 Shortness of breath: Secondary | ICD-10-CM

## 2019-10-29 DIAGNOSIS — M79605 Pain in left leg: Secondary | ICD-10-CM

## 2019-10-29 DIAGNOSIS — R609 Edema, unspecified: Secondary | ICD-10-CM | POA: Diagnosis not present

## 2019-10-29 DIAGNOSIS — M79604 Pain in right leg: Secondary | ICD-10-CM

## 2019-10-29 DIAGNOSIS — I10 Essential (primary) hypertension: Secondary | ICD-10-CM

## 2019-10-29 NOTE — Patient Instructions (Signed)
Medication Instructions:  Your Physician recommend you continue on your current medication as directed.    *If you need a refill on your cardiac medications before your next appointment, please call your pharmacy*  Lab Work: Your physician recommends that you return for lab work today ( BMP, TSH, BNP)  If you have labs (blood work) drawn today and your tests are completely normal, you will receive your results only by: Marland Kitchen MyChart Message (if you have MyChart) OR . A paper copy in the mail If you have any lab test that is abnormal or we need to change your treatment, we will call you to review the results.  Testing/Procedures: None  Follow-Up: At [facility], you and your health needs are our priority.  As part of our continuing mission to provide you with exceptional heart care, we have created designated Provider Care Teams.  These Care Teams include your primary Cardiologist (physician) and Advanced Practice Providers (APPs -  Physician Assistants and Nurse Practitioners) who all work together to provide you with the care you need, when you need it.  Your next appointment:   Feb.18, 2021  The format for your next appointment:   In Person  Provider:   Buford Dresser, MD

## 2019-10-29 NOTE — Progress Notes (Signed)
Cardiology Office Note:    Date:  10/29/2019   ID:  Debbie [name], DOB 11-28-1945, MRN [id number]  PCP:  Debbie, [name], MD  Cardiologist:  Debbie Dresser, MD PhD (previously Dr. Saunders Revel)  Referring MD: Debbie, [name], MD   CC: urgent follow up for shortness of breath  History of Present Illness:    Debbie [name] is a 73 y.o. female with a hx of persistent atrial fibrillation s/p ablation--now paroxysmal, pulmonary embolism (2017), hyperlipidemia, chronic kidney disease stage III, obesity, sleep apnea not on CPAP who is seen in follow up.   She was previously seen by Dr. Saunders Revel on 05/28/2018. At that visit, the plan was to continue anticoagulation, continue diltiazem (both standing and as needed). Recent monitor did not show sustained arrhythmias. She declined treatment for her OSA. I first met her on 09/03/18. Noted that she is very symptomatic with her atrial fibrillation.  Today: Seen urgently for shortness of breath, chest pressure and general swelling today. See telephone note from 10/25/19. Her daughter is included in the visit via Facetime.  She endorses for the last few weeks she has been having worsening swelling in her hands. Hands are worst in the morning, better during the day. Legs are the opposite, worst at the end of day. Very swollen, tender to touch bilaterally. Chest is a smothering feeling, worst when she lays down in bed. Daughter says arms, chest, ears all painful.   Has had multiple issues recently. Sudden loss of hearing in left ear, told she had a mini-stroke and a virus that caused the hearing loss and she would never get hearing back in that ear per report. I reviewed the ENT notes today for further information.  Has daily weights and vitals today, reviewed. Today is 231.4, which is within her normal range in the last month. Range has been 230.8 to 235.6, most 233.  Denies syncope or palpitations.  Past Medical History:  Diagnosis Date  . Arthritis     . Atrial fibrillation ([place])   . Benign essential tremor   . Breast cancer ([place])   . Cataract    Bilateral 2018  . Chronic renal insufficiency   . Common migraine 05/07/2015  . Complication of anesthesia   . Depression with anxiety   . Drowsiness    Excessive daytime  . Dyslipidemia   . Fibromyalgia   . History of partial seizures   . Hypertension   . Memory disturbance   . Muscle tension headache   . Obesity   . Obstructive sleep apnea   . PONV (postoperative nausea and vomiting)   . S/P epidural steroid injection   . Sacral contusion 07/2018  . Seizures ([facility])    Partial  . Sleep apnea    does not wear CPAP  . Sprain of neck 06/26/2013  . Tremors of nervous system   . Vitamin B12 deficiency     Past Surgical History:  Procedure Laterality Date  . ABDOMINAL HYSTERECTOMY    . ablation procedure     Cardiac, for Afib  . BASAL CELL CARCINOMA EXCISION    . BASAL CELL CARCINOMA EXCISION    . BRAIN MENINGIOMA EXCISION Right   . BREAST RECONSTRUCTION Bilateral   . breast reconstructive    . CATARACT EXTRACTION, BILATERAL    . CHOLECYSTECTOMY    . DILATION AND CURETTAGE OF UTERUS    . dilution    . ESOPHAGEAL MANOMETRY N/A 12/14/2016   Procedure: ESOPHAGEAL MANOMETRY (EM);  Surgeon: Karleen Hampshire  Bary Richard, MD;  Location: [facility];  Service: Endoscopy;  Laterality: N/A;  . gallbladder resection    . LUMBAR SPINE SURGERY N/A   . lumbosacral    . mastectomies Bilateral   . NOSE SURGERY     Basal cell carcinoma resection from the nose  . TUBAL LIGATION N/A     Current Medications: Current Outpatient Medications on File Prior to Visit  Medication Sig  . acetaminophen (TYLENOL) 325 MG tablet Take 2 tablets (650 mg total) by mouth every 6 (six) hours.  . calcium carbonate (OS-CAL) 600 MG TABS tablet Take 600 mg by mouth daily with breakfast.  . diltiazem (CARDIZEM CD) 240 MG 24 hr capsule TAKE 1 CAPSULE (240 MG TOTAL) BY MOUTH EVERY OTHER DAY. ROTATING WITH DILTIAZEM 360   . diltiazem (CARDIZEM CD) 360 MG 24 hr capsule Take 360 mg by mouth daily. Alternated with 240 mg  . gabapentin (NEURONTIN) 600 MG tablet Take 1 tablet (600 mg total) by mouth 3 (three) times daily.  Marland Kitchen HYDROcodone-acetaminophen (NORCO/VICODIN) 5-325 MG tablet Take 1 tablet by mouth 3 (three) times daily as needed for moderate pain.   Marland Kitchen lamoTRIgine (LAMICTAL) 25 MG tablet TAKE 1 TABLET TWICE A DAY  . nystatin (MYCOSTATIN/NYSTOP) powder Apply topically 3 (three) times daily.  . polyethylene glycol (MIRALAX / GLYCOLAX) packet Take 17 [name] by mouth as needed.  . triamcinolone cream (KENALOG) 0.1 % Apply 1 application topically 2 (two) times daily as needed. 14 days at the time  . Vitamin D, Ergocalciferol, (DRISDOL) 1.25 MG (50000 UT) CAPS capsule TAKE 1 CAPSULE BY MOUTH EVERY 10 DAYS  . warfarin (COUMADIN) 5 MG tablet Take 1/2 to 1 tablet by mouth daily as directd  . [DISCONTINUED] rivaroxaban (XARELTO) 20 MG TABS tablet Take 1 tablet (20 mg total) by mouth daily with supper.   No current facility-administered medications on file prior to visit.     Allergies:   Diclofenac, Atorvastatin, Tape, Mushroom extract complex, Tramadol, Buprenorphine hcl, Codeine, Fish oil, and Morphine and related   Social History   Tobacco Use  . Smoking status: Never Smoker  . Smokeless tobacco: Never Used  Substance Use Topics  . Alcohol use: No  . Drug use: No    Family History: The patient's family history includes Arthritis in her father and mother; Breast cancer in her sister; Cancer in her father; Cancer - Colon in her father; Colon cancer in her father; Diabetes in her maternal grandfather and mother; Heart disease in her mother; Heart failure in her maternal grandfather and mother; Hyperlipidemia in her mother; Hypertension in her mother; Rectal cancer in her maternal uncle; Stroke in her maternal grandmother and mother. There is no history of Lung disease, Esophageal cancer, or Stomach cancer.  ROS:    Please see the history of present illness.  Additional pertinent ROS: Constitutional: Negative for chills, fever, night sweats, unintentional weight loss  HENT: Positive for hearing loss.   Eyes: Negative for loss of vision and eye pain.  Respiratory: Negative for cough, sputum, wheezing.   Cardiovascular: See HPI. Gastrointestinal: Positive for general abdominal pain, chronic Genitourinary: Negative for dysuria and hematuria.  Musculoskeletal: Negative for falls. Positive for diffuse myalgias. Skin: Negative for itching and rash.  Neurological: Negative for focal weakness and loss of consciousness. Positive for sensitivity to minimal touch Endo/Heme/Allergies: Does bruise/bleed easily.   EKGs/Labs/Other Studies Reviewed:    The following studies were reviewed today: Cath/PCI:  LHC(greater than 10 years ago in [place], [place]): Normal per  patient's report.  CV Surgery:  None  EP Procedures and Devices:  48-hour Holter monitor (05/03/18): Predominantly sinus rhythm with rare PACs, PVCs, and brief atrial runs lasting up to 4 beats.  No sustained arrhythmia.  30-day event monitor (12/29/16): Predominantly sinus rhythm without significant abnormalities, though quite a bit of artifact was noted.  Atrial fibrillation cryoablation (08/17/16, Dr. Minna Merritts, [facility])  Atrial fibrillation radiofrequency ablation (2014)  Non-Invasive Evaluation(s):  TTE (01/12/17): Normal LV size with mild LVH. LVEF 55-60% with grade 1 diastolic dysfunction. Moderate left atrial enlargement. Trivial pericardial effusion. Normal RV size and function. Normal CVP.  Pharmacologic myocardial perfusion stress test (12/29/16): Low-risk study with fixed mid and apical anterior/anteroseptal/septal defect most likely representing breast attenuation. Small basal inferolateral and basal anterolateral partially reversible defect that most likely represents shifting breast attenuation, though small area of ischemia  cannot be excluded. LVEF 55-65%.  Limited TTE (09/13/16): Grossly normal LV size and function. Small pericardial effusion.  Limited TTE (09/06/16): Mild to moderate pericardial effusion.  Limited TTE (08/18/16): Small pericardial effusion near RV.  EKG:  EKG is personally reviewed today.  The ekg ordered 03/04/19 demonstrates afib RVR  Recent Labs: 03/04/2019: Magnesium 2.2; TSH 1.867 03/05/2019: BUN 20; Creatinine, Ser 0.97; Hemoglobin 14.5; Platelets 156; Potassium 4.8; Sodium 141  Recent Lipid Panel No results found for: CHOL, TRIG, HDL, CHOLHDL, VLDL, LDLCALC, LDLDIRECT  Physical Exam:    VS:  BP 123/65   Pulse 79   Temp (!) 97.5 F (36.4 C)   Ht '5\' 8"'  (1.727 m)   Wt 231 lb 6.4 oz (105 kg)   SpO2 93%   BMI 35.18 kg/m     Wt Readings from Last 3 Encounters:  10/29/19 231 lb 6.4 oz (105 kg)  06/27/19 237 lb (107.5 kg)  06/25/19 234 lb (106.1 kg)    GEN: Well nourished, well developed in no acute distress HEENT: Normal, moist mucous membranes NECK: JVD just at clavicle at 90 degrees CARDIAC: regular rhythm, normal S1 and S2, no rubs or gallops. No murmur. VASCULAR: Radial and DP pulses 2+ bilaterally. No carotid bruits RESPIRATORY:  Clear to auscultation without rales, wheezing or rhonchi  ABDOMEN: Soft, non-tender, non-distended MUSCULOSKELETAL:  Ambulates independently SKIN: Warm and dry. Very mild LE edema bilaterally. See below re: neuro. No rashes or erythema. NEUROLOGIC:  Alert and oriented x 3. No focal neuro deficits noted. Reports exquisite tenderness to even the lightest of touch over bilateral leg surfaces, over both calves in multiple locations. Reports that it feels like needles. PSYCHIATRIC:  Normal affect   ASSESSMENT:    1. SOB (shortness of breath)   2. Edema, unspecified type   3. Paroxysmal atrial fibrillation (HCC)   4. Bilateral leg pain   5. Essential hypertension    PLAN:    Shortness of breath, chest tightness intermittently: breathing  comfortably during interview, O2 sats within normal range (>92%). Only very slight JVD, lungs clear, minimal edema. May have some orthopnea on history. Weights stable. Likely mixed picture. Denies infectious symptoms. Not exertional. -check BNP -check BMET in case diuretics needed -check TSH given reported intermittent generalized edema, though not visualized on today's exam -counseled on red flag warning signs that need immediate medical attention  Bilateral trace LE edema and extreme sensitivity: unchanged from our prior visit. Has been working on nerve/fibromyalgia regimen. Suspicion is high that this is the driving etiology, as no other clear findings on exam. At our last visit she was tapering off the cymbalta, and  now she is no longer taking. I do not think this pain is cardiovascular and defer to her primary team.  Paroxysmal atrial fibrillation, in sinus rhythm, on long term anticoagulation: -she is very symptomatic with her atrial fibrillation. Feels she has been in NSR recently.  -declines CPAP for OSA, cannot tolerate -regular, rate controlled today on exam -CHA2DS2/VAS Stroke Risk Points  =5, currently on coumadin. Have discussed DOAC in the past  Hypertension: well controlled today. Continue current management.  Plan for follow up: already scheduled for 12/26/19  Medication Adjustments/Labs and Tests Ordered: Current medicines are reviewed at length with the patient today.  Concerns regarding medicines are outlined above.  Orders Placed This Encounter  Procedures  . Brain natriuretic peptide  . TSH  . Basic metabolic panel   No orders of the defined types were placed in this encounter.   Patient Instructions  Medication Instructions:  Your Physician recommend you continue on your current medication as directed.    *If you need a refill on your cardiac medications before your next appointment, please call your pharmacy*  Lab Work: Your physician recommends that you  return for lab work today ( BMP, TSH, BNP)  If you have labs (blood work) drawn today and your tests are completely normal, you will receive your results only by: Marland Kitchen MyChart Message (if you have MyChart) OR . A paper copy in the mail If you have any lab test that is abnormal or we need to change your treatment, we will call you to review the results.  Testing/Procedures: None  Follow-Up: At [facility], you and your health needs are our priority.  As part of our continuing mission to provide you with exceptional heart care, we have created designated Provider Care Teams.  These Care Teams include your primary Cardiologist (physician) and Advanced Practice Providers (APPs -  Physician Assistants and Nurse Practitioners) who all work together to provide you with the care you need, when you need it.  Your next appointment:   Feb.18, 2021  The format for your next appointment:   In Person  Provider:   Buford Dresser, MD      Signed, Debbie Dresser, MD PhD 10/29/2019     [place]

## 2019-10-30 LAB — BASIC METABOLIC PANEL
BUN/Creatinine Ratio: 11 — ABNORMAL LOW (ref 12–28)
BUN: 8 mg/dL (ref 8–27)
CO2: 26 mmol/L (ref 20–29)
Calcium: 9.3 mg/dL (ref 8.7–10.3)
Chloride: 105 mmol/L (ref 96–106)
Creatinine, Ser: 0.74 mg/dL (ref 0.57–1.00)
GFR calc Af Amer: 93 mL/min/{1.73_m2} (ref >59)
GFR calc non Af Amer: 81 mL/min/{1.73_m2} (ref >59)
Glucose: 71 mg/dL (ref 65–99)
Potassium: 4.5 mmol/L (ref 3.5–5.2)
Sodium: 144 mmol/L (ref 134–144)

## 2019-10-30 LAB — TSH: TSH: 1.58 u[IU]/mL (ref 0.450–4.500)

## 2019-10-30 LAB — BRAIN NATRIURETIC PEPTIDE: BNP: 170.5 pg/mL — ABNORMAL HIGH (ref 0.0–100.0)

## 2019-10-31 ENCOUNTER — Encounter: Payer: Self-pay | Admitting: Cardiology

## 2019-10-31 ENCOUNTER — Telehealth: Payer: Self-pay | Admitting: Cardiology

## 2019-10-31 NOTE — Telephone Encounter (Signed)
Pt called requesting lab results. Nurse provided pt with preliminary report and informed that nurse will call back with MD's recommendations once they are reviewed. Pt verbalized understanding.

## 2019-10-31 NOTE — Telephone Encounter (Signed)
Patient is calling in regards to results. Please advise.

## 2019-11-04 MED ORDER — FUROSEMIDE 40 MG PO TABS: 40.0000 mg | ORAL_TABLET | ORAL | 0 refills | Status: DC | PRN

## 2019-11-04 NOTE — Telephone Encounter (Signed)
Follow Up  Patient is calling in again to go over doctor's recommendation about her lab results. Please give patient a call back today to advise.

## 2019-11-04 NOTE — Telephone Encounter (Signed)
Pt updated with lab work and MD's recommendations. Pt verbalized understanding. New orders placed.

## 2019-11-04 NOTE — Addendum Note (Signed)
Addended by: Meryl Crutch on: 11/04/2019 03:02 PM   Modules accepted: Orders

## 2019-11-06 ENCOUNTER — Telehealth (INDEPENDENT_AMBULATORY_CARE_PROVIDER_SITE_OTHER): Payer: Medicare Other | Admitting: Family Medicine

## 2019-11-06 ENCOUNTER — Ambulatory Visit: Payer: Self-pay

## 2019-11-06 ENCOUNTER — Other Ambulatory Visit: Payer: Self-pay

## 2019-11-06 DIAGNOSIS — R6889 Other general symptoms and signs: Secondary | ICD-10-CM

## 2019-11-06 NOTE — Telephone Encounter (Signed)
  Pt. Reports she started feeling bad lat night - chest congestion , headache and low grade fever. Reports she is concerned it "could be COVID 19 and I have some health issues." Warm transfer to [facility] in the practice for a virtual visit. Answer Assessment - Initial Assessment Questions 1. COVID-19 DIAGNOSIS: "Who made your Coronavirus (COVID-19) diagnosis?" "Was it confirmed by a positive lab test?" If not diagnosed by a HCP, ask "Are there lots of cases (community spread) where you live?" (See public health department website, if unsure)     No test 2. COVID-19 EXPOSURE: "Was there any known exposure to COVID before the symptoms began?" CDC Definition of close contact: within 6 feet (2 meters) for a total of 15 minutes or more over a 24-hour period.      No 3. ONSET: "When did the COVID-19 symptoms start?"      Yesterday 4. WORST SYMPTOM: "What is your worst symptom?" (e.g., cough, fever, shortness of breath, muscle aches)     Chest 5. COUGH: "Do you have a cough?" If so, ask: "How bad is the cough?"       Yes 6. FEVER: "Do you have a fever?" If so, ask: "What is your temperature, how was it measured, and when did it start?"     99.6 7. RESPIRATORY STATUS: "Describe your breathing?" (e.g., shortness of breath, wheezing, unable to speak)      No problems 8. BETTER-SAME-WORSE: "Are you getting better, staying the same or getting worse compared to yesterday?"  If getting worse, ask, "In what way?"     Worse 9. HIGH RISK DISEASE: "Do you have any chronic medical problems?" (e.g., asthma, heart or lung disease, weak immune system, obesity, etc.)     A-fib, Pneumonia and blood clot in lung 10. PREGNANCY: "Is there any chance you are pregnant?" "When was your last menstrual period?"       No 11. OTHER SYMPTOMS: "Do you have any other symptoms?"  (e.g., chills, fatigue, headache, loss of smell or taste, muscle pain, sore throat; new loss of smell or taste especially support the diagnosis of  COVID-19)       Headache  Protocols used: CORONAVIRUS (COVID-19) DIAGNOSED OR SUSPECTED-A-AH

## 2019-11-06 NOTE — Telephone Encounter (Signed)
FYI - pt has a virtual visit with Dr. Sarajane Jews this morning.

## 2019-11-06 NOTE — Telephone Encounter (Signed)
Message Routed to PCP CMA 

## 2019-11-06 NOTE — Progress Notes (Signed)
Virtual Visit via Video Note  I connected with the patient on 11/06/19 at  9:30 AM EST by a video enabled telemedicine application and verified that I am speaking with the correct person using two identifiers.  Location patient: home Location provider:work or home office Persons participating in the virtual visit: patient, provider  I discussed the limitations of evaluation and management by telemedicine and the availability of in person appointments. The patient expressed understanding and agreed to proceed.   HPI: Here for the onset yesterday of headaches, body aches, chest congestion, a dry cough, and fever to 99.6 degrees. No chest pain or SOB. No NVD. She is drinking fluids and taking Tylenol. Currently her husband and her daughter, who is staying with her, have no symptoms. Her daughter-in-law, who spent Christmas with them and who has now gone back to her home, has had similar symptoms for 2 days.    ROS: See pertinent positives and negatives per HPI.  Past Medical History:  Diagnosis Date  . Arthritis   . Atrial fibrillation ([place])   . Benign essential tremor   . Breast cancer ([place])   . Cataract    Bilateral 2018  . Chronic renal insufficiency   . Common migraine 05/07/2015  . Complication of anesthesia   . Depression with anxiety   . Drowsiness    Excessive daytime  . Dyslipidemia   . Fibromyalgia   . History of partial seizures   . Hypertension   . Memory disturbance   . Muscle tension headache   . Obesity   . Obstructive sleep apnea   . PONV (postoperative nausea and vomiting)   . S/P epidural steroid injection   . Sacral contusion 07/2018  . Seizures ([facility])    Partial  . Sleep apnea    does not wear CPAP  . Sprain of neck 06/26/2013  . Tremors of nervous system   . Vitamin B12 deficiency     Past Surgical History:  Procedure Laterality Date  . ABDOMINAL HYSTERECTOMY    . ablation procedure     Cardiac, for Afib  . BASAL CELL CARCINOMA EXCISION    .  BASAL CELL CARCINOMA EXCISION    . BRAIN MENINGIOMA EXCISION Right   . BREAST RECONSTRUCTION Bilateral   . breast reconstructive    . CATARACT EXTRACTION, BILATERAL    . CHOLECYSTECTOMY    . DILATION AND CURETTAGE OF UTERUS    . dilution    . ESOPHAGEAL MANOMETRY N/A 12/14/2016   Procedure: ESOPHAGEAL MANOMETRY (EM);  Surgeon: Mauri Pole, MD;  Location: [facility];  Service: Endoscopy;  Laterality: N/A;  . gallbladder resection    . LUMBAR SPINE SURGERY N/A   . lumbosacral    . mastectomies Bilateral   . NOSE SURGERY     Basal cell carcinoma resection from the nose  . TUBAL LIGATION N/A     Family History  Problem Relation Age of Onset  . Heart failure Mother   . Diabetes Mother   . Arthritis Mother   . Hyperlipidemia Mother   . Heart disease Mother   . Stroke Mother   . Hypertension Mother   . Cancer - Colon Father   . Colon cancer Father        late 71's  . Arthritis Father   . Cancer Father   . Breast cancer Sister   . Diabetes Maternal Grandfather   . Heart failure Maternal Grandfather   . Stroke Maternal Grandmother   . Rectal cancer Maternal  Uncle   . Lung disease Neg Hx   . Esophageal cancer Neg Hx   . Stomach cancer Neg Hx      Current Outpatient Medications:  .  acetaminophen (TYLENOL) 325 MG tablet, Take 2 tablets (650 mg total) by mouth every 6 (six) hours., Disp: , Rfl:  .  calcium carbonate (OS-CAL) 600 MG TABS tablet, Take 600 mg by mouth daily with breakfast., Disp: , Rfl:  .  diltiazem (CARDIZEM CD) 240 MG 24 hr capsule, TAKE 1 CAPSULE (240 MG TOTAL) BY MOUTH EVERY OTHER DAY. ROTATING WITH DILTIAZEM 360, Disp: 15 capsule, Rfl: 6 .  diltiazem (CARDIZEM CD) 360 MG 24 hr capsule, Take 360 mg by mouth daily. Alternated with 240 mg, Disp: , Rfl:  .  furosemide (LASIX) 40 MG tablet, Take 1 tablet (40 mg total) by mouth as needed., Disp: 30 tablet, Rfl: 0 .  gabapentin (NEURONTIN) 600 MG tablet, Take 1 tablet (600 mg total) by mouth 3 (three) times  daily., Disp: 540 tablet, Rfl: 0 .  HYDROcodone-acetaminophen (NORCO/VICODIN) 5-325 MG tablet, Take 1 tablet by mouth 3 (three) times daily as needed for moderate pain. , Disp: , Rfl:  .  lamoTRIgine (LAMICTAL) 25 MG tablet, TAKE 1 TABLET TWICE A DAY, Disp: 180 tablet, Rfl: 0 .  nystatin (MYCOSTATIN/NYSTOP) powder, Apply topically 3 (three) times daily., Disp: 60 g, Rfl: 2 .  polyethylene glycol (MIRALAX / GLYCOLAX) packet, Take 17 g by mouth as needed., Disp: , Rfl:  .  triamcinolone cream (KENALOG) 0.1 %, Apply 1 application topically 2 (two) times daily as needed. 14 days at the time, Disp: 30 g, Rfl: 2 .  Vitamin D, Ergocalciferol, (DRISDOL) 1.25 MG (50000 UT) CAPS capsule, TAKE 1 CAPSULE BY MOUTH EVERY 10 DAYS, Disp: 9 capsule, Rfl: 3 .  warfarin (COUMADIN) 5 MG tablet, Take 1/2 to 1 tablet by mouth daily as directd, Disp: 90 tablet, Rfl: 1  EXAM:  VITALS per patient if applicable:  GENERAL: alert, oriented, appears well and in no acute distress  HEENT: atraumatic, conjunttiva clear, no obvious abnormalities on inspection of external nose and ears  NECK: normal movements of the head and neck  LUNGS: on inspection no signs of respiratory distress, breathing rate appears normal, no obvious gross SOB, gasping or wheezing  CV: no obvious cyanosis  MS: moves all visible extremities without noticeable abnormality  PSYCH/NEURO: pleasant and cooperative, no obvious depression or anxiety, speech and thought processing grossly intact  ASSESSMENT AND PLAN: Flu-like symptoms which are likely the result of a Covid-19 infection. She likely got this from her daughter-in-law. I suggested she take Mucinex for the chest congestion. She and the rest of her family plan to go to an urgent care clinic today to be tested for the Covid virus. She knows to let us know if she develops any trouble breathing.  Alysia Penna, MD  Discussed the following assessment and plan:  No diagnosis found.     I  discussed the assessment and treatment plan with the patient. The patient was provided an opportunity to ask questions and all were answered. The patient agreed with the plan and demonstrated an understanding of the instructions.   The patient was advised to call back or seek an in-person evaluation if the symptoms worsen or if the condition fails to improve as anticipated.

## 2019-11-15 ENCOUNTER — Telehealth (INDEPENDENT_AMBULATORY_CARE_PROVIDER_SITE_OTHER): Payer: Medicare Other | Admitting: Family Medicine

## 2019-11-15 ENCOUNTER — Encounter: Payer: Self-pay | Admitting: Family Medicine

## 2019-11-15 VITALS — BP 143/80 | HR 79 | Ht 68.0 in

## 2019-11-15 DIAGNOSIS — I129 Hypertensive chronic kidney disease with stage 1 through stage 4 chronic kidney disease, or unspecified chronic kidney disease: Secondary | ICD-10-CM | POA: Diagnosis not present

## 2019-11-15 DIAGNOSIS — U071 COVID-19: Secondary | ICD-10-CM | POA: Diagnosis not present

## 2019-11-15 DIAGNOSIS — R0600 Dyspnea, unspecified: Secondary | ICD-10-CM

## 2019-11-15 DIAGNOSIS — N182 Chronic kidney disease, stage 2 (mild): Secondary | ICD-10-CM

## 2019-11-15 DIAGNOSIS — I4891 Unspecified atrial fibrillation: Secondary | ICD-10-CM | POA: Diagnosis not present
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
## 2019-11-18 ENCOUNTER — Telehealth: Payer: Self-pay | Admitting: Pharmacist

## 2019-11-18 ENCOUNTER — Ambulatory Visit (INDEPENDENT_AMBULATORY_CARE_PROVIDER_SITE_OTHER): Payer: Medicare Other | Admitting: Family Medicine

## 2019-11-18 VITALS — BP 110/80 | HR 61 | Temp 98.3°F | Ht 68.0 in

## 2019-11-18 DIAGNOSIS — R0602 Shortness of breath: Secondary | ICD-10-CM | POA: Diagnosis not present

## 2019-11-18 DIAGNOSIS — I4891 Unspecified atrial fibrillation: Secondary | ICD-10-CM

## 2019-11-18 DIAGNOSIS — Z7901 Long term (current) use of anticoagulants: Secondary | ICD-10-CM

## 2019-11-18 DIAGNOSIS — R0789 Other chest pain: Secondary | ICD-10-CM | POA: Diagnosis not present

## 2019-11-18 DIAGNOSIS — D6859 Other primary thrombophilia: Secondary | ICD-10-CM

## 2019-11-18 DIAGNOSIS — U071 COVID-19: Secondary | ICD-10-CM

## 2019-11-18 DIAGNOSIS — I4811 Longstanding persistent atrial fibrillation: Secondary | ICD-10-CM

## 2019-11-18 MED ORDER — CEFDINIR 300 MG PO CAPS: 600.0000 mg | ORAL_CAPSULE | Freq: Every day | ORAL | 0 refills | Status: DC

## 2019-11-18 NOTE — Telephone Encounter (Signed)
Patient called stating she would not be able to come to coumadin appointment on Thursday. But that she is getting labs done tonight at [facility] and wondering if she can get labs done there. I have ordered labs to be drawn at [facility].  However after calling over to [facility]. Lab closes at 5:30 due to respiratory clinic. I am not sure if there will be someone drawing labs at respiratory clinic or not. Patient states her PCP wants a CBC and a x-ray.  We cannot see patient in coumadin clinic until her symptoms improve.
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
## 2019-11-18 NOTE — Patient Instructions (Signed)
For x-ray imaging: [facility]  [place], [place], [place] [address] Enter through [place] and request x-ray department. You will be contacted either via phone or via [place] with your x-ray result.   Go immediately the emergency department if your oxygen drops to 88%. If shortness of breath or worsens go immediately to the Emergency department     Hypoxia Hypoxia is a condition that happens when there is a lack of oxygen in the body's tissues and organs. When there is not enough oxygen, organs cannot work as they should. This causes serious problems throughout the body and in the brain. What are the causes? This condition may be caused by:  Exposure to high altitude.  A collapsed lung (pneumothorax).  Lung infection (pneumonia).  Lung injury.  Long-term (chronic) lung disease, such as COPD (chronic obstructive pulmonary disease).  Blood collecting in the chest cavity (hemothorax).  Food, saliva, or vomit getting into the airway (aspiration).  Reduced blood flow (ischemia).  Severe blood loss.  Slow or shallow breathing (hypoventilation).  Blood disorders, such as anemia.  Carbon monoxide poisoning.  The heart suddenly stopping (cardiac arrest).  Anesthetic medicines.  Drowning.  Choking. What are the signs or symptoms? Symptoms of this condition include:  Headache.  Fatigue.  Drowsiness.  Forgetfulness.  Nausea.  Confusion.  Shortness of breath.  Dizziness.  Bluish color of the skin, lips, or nail beds (cyanosis).  Change in consciousness or awareness. If hypoxia is not treated, it can lead to convulsions, loss of consciousness (coma), or brain damage. How is this diagnosed? This condition may be diagnosed based on:  A physical exam.  Blood tests.  A test that measures how much oxygen is in your blood (pulse oximetry). This is done with a sensor that is placed on your finger, toe, or earlobe.  Chest X-ray.  Tests  to check your lung function (pulmonary function tests).  A test to check the electrical activity of your heart (electrocardiogram, ECG). You may have other tests to determine the cause of your hypoxia. How is this treated?  Treatment for this condition depends on what is causing the hypoxia. You will likely be treated with oxygen therapy. This may be done by giving you oxygen through a face mask or through tubes in your nose. Your health care provider may also recommend other therapies to treat the underlying cause of your hypoxia. Follow these instructions at home:  Take over-the-counter and prescription medicines only as told by your health care provider.  Do not use any products that contain nicotine or tobacco, such as cigarettes and e-cigarettes. If you need help quitting, ask your health care provider.  Avoid secondhand smoke.  Work with your health care provider to manage any chronic conditions you have that may be causing hypoxia, such as COPD.  Keep all follow-up visits as told by your health care provider. This is important. Contact a health care provider if:  You have a fever.  You have trouble breathing, even after treatment.  You become extremely short of breath when you exercise. Get help right away if:  Your shortness of breath gets worse, especially with normal or very little activity.  Your skin, lips, or nail beds have a bluish color.  You become confused or you cannot think properly.  You have chest pain. Summary  Hypoxia is a condition that happens when there is a lack of oxygen in the body's tissues and organs.  If hypoxia is not treated, it  can lead to convulsions, loss of consciousness (coma), or brain damage.  Symptoms of hypoxia can include a headache, shortness of breath, confusion, nausea, and a bluish skin color.  Hypoxia has many possible causes, including exposure to high altitude, carbon monoxide poisoning, or other health issues, such as  blood disorders or cardiac arrest.  Hypoxia is usually treated with oxygen therapy. This information is not intended to replace advice given to you by your health care provider. Make sure you discuss any questions you have with your health care provider. Document Revised: 10/06/2017 Document Reviewed: 12/12/2016 Elsevier Patient Education  [place] of Breath, Adult Shortness of breath means you have trouble breathing. Shortness of breath could be a sign of a medical problem. Follow these instructions at home:   Watch for any changes in your symptoms.  Do not use any products that contain nicotine or tobacco, such as cigarettes, e-cigarettes, and chewing tobacco.  Do not smoke. Smoking can cause shortness of breath. If you need help to quit smoking, ask your doctor.  Avoid things that can make it harder to breathe, such as: ? Mold. ? Dust. ? Air pollution. ? Chemical smells. ? Things that can cause allergy symptoms (allergens), if you have allergies.  Keep your living space clean. Use products that help remove mold and dust.  Rest as needed. Slowly return to your normal activities.  Take over-the-counter and prescription medicines only as told by your doctor. This includes oxygen therapy and inhaled medicines.  Keep all follow-up visits as told by your doctor. This is important. Contact a doctor if:  Your condition does not get better as soon as expected.  You have a hard time doing your normal activities, even after you rest.  You have new symptoms. Get help right away if:  Your shortness of breath gets worse.  You have trouble breathing when you are resting.  You feel light-headed or you pass out (faint).  You have a cough that is not helped by medicines.  You cough up blood.  You have pain with breathing.  You have pain in your chest, arms, shoulders, or belly (abdomen).  You have a fever.  You cannot walk up stairs.  You cannot  exercise the way you normally do. These symptoms may represent a serious problem that is an emergency. Do not wait to see if the symptoms will go away. Get medical help right away. Call your local emergency services (911 in the [place] Do not drive yourself to the hospital. Summary  Shortness of breath is when you have trouble breathing enough air. It can be a sign of a medical problem.  Avoid things that make it hard for you to breathe, such as smoking, pollution, mold, and dust.  Watch for any changes in your symptoms. Contact your doctor if you do not get better or you get worse. This information is not intended to replace advice given to you by your health care provider. Make sure you discuss any questions you have with your health care provider. Document Revised: 03/26/2018 Document Reviewed: 03/26/2018 Elsevier Patient Education  [place].

## 2019-11-20 ENCOUNTER — Telehealth: Payer: Self-pay

## 2019-11-20 NOTE — Telephone Encounter (Signed)
Called pt to touch base since her last visit. Pt says she is doing better she is continuing using [facility]. They came to visit her at home yesterday. Temp has been roughly the same, no issues breathing patient says her oxygen is up now at 91 %. She did not take the prescription given the day of her visit she instead took the medication given by [facility], Zpack and prednisone.

## 2019-11-21 ENCOUNTER — Telehealth: Payer: Self-pay | Admitting: *Deleted

## 2019-11-21 ENCOUNTER — Encounter: Payer: Self-pay | Admitting: *Deleted

## 2019-11-21 DIAGNOSIS — I2782 Chronic pulmonary embolism: Secondary | ICD-10-CM | POA: Diagnosis not present

## 2019-11-21 LAB — PROTIME-INR: INR: 6 — AB (ref <=1.1)

## 2019-11-21 NOTE — Telephone Encounter (Addendum)
Pt called and she stated that she talked to [facility], who stated that they would be able to come out to her house today and check her INR. Put pt's name in the coumadin clinic's book for follow up. Informed pt to let us know if [facility] dose not come to the house today and to call us when they are out there checking her INR so that we are able to give her dosing instructions.   Pt also stated that she took an albuterol treatment last night and felt her HR going up and when she checked her HR she stated that it was reading 140's . Informed her that she Albuterol can make her HR go up but recommended that she call her Dr and make them aware. Informed her that I could transfer her to triage. Pt stated that was not necessary right now because she is being closely monitored by [facility] and feeling better. Still encouraged her to reach out and make the Dr aware.

## 2019-11-21 NOTE — Telephone Encounter (Signed)
This encounter was created in error - please disregard.

## 2019-11-21 NOTE — Telephone Encounter (Signed)
Pt called stating that [facility] came by and took her INR. Pt stated that they said the results would not be ready until tomorrow 11/22/2019. Pt stated that she was feeling much better and told [facility] that her HR went up after she took the albuterol she stated that they gave her another inhaler but she dose not know the name. In documentation it was noted that pt was started on prednisone. Pt stated that it was prescribed and that [facility] knows that she is not taking it because it can make her HR go up as well. Pt is still taking a Z pack started it yesterday, had second dose today and is suppose to be finished with it by 11/25/2019. Pt stated she has not missed any doses on warfarin. Instructed pt to try to have a serving of food that was high in vitamin K while she was on antibiotic. Pt stated that she has not had an appetite but will try to drink some boost.

## 2019-11-22 ENCOUNTER — Ambulatory Visit (INDEPENDENT_AMBULATORY_CARE_PROVIDER_SITE_OTHER): Payer: Medicare Other

## 2019-11-22 DIAGNOSIS — Z5181 Encounter for therapeutic drug level monitoring: Secondary | ICD-10-CM | POA: Diagnosis not present

## 2019-11-22 DIAGNOSIS — Z86711 Personal history of pulmonary embolism: Secondary | ICD-10-CM

## 2019-11-22 DIAGNOSIS — Z7901 Long term (current) use of anticoagulants: Secondary | ICD-10-CM | POA: Diagnosis not present

## 2019-11-22 DIAGNOSIS — I2782 Chronic pulmonary embolism: Secondary | ICD-10-CM

## 2019-11-22 NOTE — Patient Instructions (Signed)
Description   Called spoke with pt, advised to hold Warfarin x 3 dosages, then start taking 1 tablet daily except 1/2 tablet on Sundays, Tuesdays and Thursdays. Recheck INR on Tuesday.  Go to the ED with bleeding problems.  Call Coumadin Clinic [phone] if you have any changes in medications.

## 2019-11-26 ENCOUNTER — Encounter: Payer: Self-pay | Admitting: Family Medicine

## 2019-11-26 ENCOUNTER — Telehealth: Payer: Self-pay | Admitting: Family Medicine

## 2019-11-26 DIAGNOSIS — Z79899 Other long term (current) drug therapy: Secondary | ICD-10-CM | POA: Diagnosis not present

## 2019-11-26 DIAGNOSIS — G894 Chronic pain syndrome: Secondary | ICD-10-CM | POA: Diagnosis not present

## 2019-11-26 DIAGNOSIS — M5136 Other intervertebral disc degeneration, lumbar region: Secondary | ICD-10-CM | POA: Diagnosis not present

## 2019-11-26 DIAGNOSIS — Z7901 Long term (current) use of anticoagulants: Secondary | ICD-10-CM | POA: Diagnosis not present

## 2019-11-26 DIAGNOSIS — E559 Vitamin D deficiency, unspecified: Secondary | ICD-10-CM | POA: Diagnosis not present

## 2019-11-26 LAB — PROTIME-INR: INR: 1.9 — AB (ref 0.9–1.1)

## 2019-11-26 NOTE — Telephone Encounter (Signed)
Form completed & placed in pcp's red folder for signature.

## 2019-11-26 NOTE — Telephone Encounter (Signed)
Handicap Placard to be filled out- placed in doctor's folder.  Call [phone] upon completion.

## 2019-11-27 ENCOUNTER — Ambulatory Visit (INDEPENDENT_AMBULATORY_CARE_PROVIDER_SITE_OTHER): Payer: Medicare Other | Admitting: *Deleted

## 2019-11-27 DIAGNOSIS — Z86711 Personal history of pulmonary embolism: Secondary | ICD-10-CM

## 2019-11-27 DIAGNOSIS — I2782 Chronic pulmonary embolism: Secondary | ICD-10-CM

## 2019-11-27 DIAGNOSIS — Z7901 Long term (current) use of anticoagulants: Secondary | ICD-10-CM

## 2019-11-27 DIAGNOSIS — Z5181 Encounter for therapeutic drug level monitoring: Secondary | ICD-10-CM | POA: Diagnosis not present

## 2019-11-27 NOTE — Patient Instructions (Signed)
Description   Spoke with pt and instructed pt to take 1.5 tablets today then continue taking 1 tablet daily except 1/2 tablet on Sundays, Tuesdays and Thursdays. Recheck INR in 1 week. Call Coumadin Clinic [phone] if you have any changes in medications.

## 2019-11-29 NOTE — Telephone Encounter (Signed)
Pt made aware that form is ready for pick up and up front.

## 2019-12-02 ENCOUNTER — Other Ambulatory Visit: Payer: Self-pay

## 2019-12-02 MED ORDER — FUROSEMIDE 40 MG PO TABS: 40.0000 mg | ORAL_TABLET | ORAL | 0 refills | Status: DC | PRN

## 2019-12-03 DIAGNOSIS — U071 COVID-19: Secondary | ICD-10-CM | POA: Diagnosis not present

## 2019-12-03 LAB — PROTIME-INR: INR: 3.7 — AB (ref 0.9–1.1)

## 2019-12-05 ENCOUNTER — Ambulatory Visit (INDEPENDENT_AMBULATORY_CARE_PROVIDER_SITE_OTHER): Payer: Medicare Other | Admitting: Internal Medicine

## 2019-12-05 DIAGNOSIS — Z5181 Encounter for therapeutic drug level monitoring: Secondary | ICD-10-CM

## 2019-12-05 NOTE — Patient Instructions (Signed)
Description   Spoke with pt and instructed pt to hold today and then continue taking 1 tablet daily except 1/2 tablet on Sundays, Tuesdays and Thursdays. Recheck INR in 1 week. Call Coumadin Clinic [phone] if you have any changes in medications.

## 2019-12-05 NOTE — Progress Notes (Signed)
Debbie [name] called from [facility] and stated that pt's INR was 3.7. [facility] will be going out on Tuesday 12/10/2019 and will recheck INR.   [facility]: [phone]

## 2019-12-10 DIAGNOSIS — Z7901 Long term (current) use of anticoagulants: Secondary | ICD-10-CM | POA: Diagnosis not present

## 2019-12-10 LAB — PROTIME-INR: INR: 2.8 — AB (ref 0.9–1.1)

## 2019-12-11 ENCOUNTER — Ambulatory Visit (INDEPENDENT_AMBULATORY_CARE_PROVIDER_SITE_OTHER): Payer: Medicare Other

## 2019-12-11 DIAGNOSIS — Z5181 Encounter for therapeutic drug level monitoring: Secondary | ICD-10-CM | POA: Diagnosis not present

## 2019-12-11 DIAGNOSIS — I2782 Chronic pulmonary embolism: Secondary | ICD-10-CM | POA: Diagnosis not present

## 2019-12-11 DIAGNOSIS — Z86711 Personal history of pulmonary embolism: Secondary | ICD-10-CM

## 2019-12-11 DIAGNOSIS — Z7901 Long term (current) use of anticoagulants: Secondary | ICD-10-CM

## 2019-12-11 NOTE — Patient Instructions (Addendum)
Description   Spoke with pt and instructed pt to continue on same dosage 1 tablet daily except 1/2 tablet on Sundays, Tuesdays and Thursdays. Recheck INR in 1 week. Call Coumadin Clinic [phone] if you have any changes in medications.Faxed written order for recheck to [place] [phone].

## 2019-12-17 DIAGNOSIS — Z7901 Long term (current) use of anticoagulants: Secondary | ICD-10-CM | POA: Diagnosis not present

## 2019-12-17 LAB — PROTIME-INR: INR: 2 — AB (ref 0.9–1.1)

## 2019-12-19 ENCOUNTER — Ambulatory Visit (INDEPENDENT_AMBULATORY_CARE_PROVIDER_SITE_OTHER): Payer: Medicare Other | Admitting: Cardiology

## 2019-12-19 DIAGNOSIS — Z5181 Encounter for therapeutic drug level monitoring: Secondary | ICD-10-CM

## 2019-12-19 NOTE — Patient Instructions (Signed)
Description   Spoke with pt and instructed pt to continue on same dosage 1 tablet daily except 1/2 tablet on Sundays, Tuesdays and Thursdays. Recheck INR in 2 week- pt wanting to come into office. Call Coumadin Clinic [phone] if you have any changes in medications.

## 2019-12-26 ENCOUNTER — Ambulatory Visit: Payer: Medicare Other | Admitting: Cardiology

## 2019-12-27 DIAGNOSIS — M5136 Other intervertebral disc degeneration, lumbar region: Secondary | ICD-10-CM | POA: Diagnosis not present

## 2019-12-27 DIAGNOSIS — G894 Chronic pain syndrome: Secondary | ICD-10-CM | POA: Diagnosis not present

## 2019-12-27 DIAGNOSIS — Z79899 Other long term (current) drug therapy: Secondary | ICD-10-CM | POA: Diagnosis not present

## 2020-01-02 DIAGNOSIS — Z7901 Long term (current) use of anticoagulants: Secondary | ICD-10-CM | POA: Diagnosis not present

## 2020-01-03 ENCOUNTER — Ambulatory Visit (INDEPENDENT_AMBULATORY_CARE_PROVIDER_SITE_OTHER): Payer: Medicare Other

## 2020-01-03 DIAGNOSIS — Z5181 Encounter for therapeutic drug level monitoring: Secondary | ICD-10-CM | POA: Diagnosis not present

## 2020-01-03 DIAGNOSIS — Z86711 Personal history of pulmonary embolism: Secondary | ICD-10-CM | POA: Diagnosis not present

## 2020-01-03 DIAGNOSIS — I2782 Chronic pulmonary embolism: Secondary | ICD-10-CM

## 2020-01-03 DIAGNOSIS — Z7901 Long term (current) use of anticoagulants: Secondary | ICD-10-CM

## 2020-01-03 LAB — PROTIME-INR: INR: 1.5 — AB (ref 0.9–1.1)

## 2020-01-03 NOTE — Patient Instructions (Signed)
Description   Spoke with pt and instructed pt to take 1.5 tablets today, then start taking 1 tablet daily except 1/2 tablet on Sundays and Thursdays. Recheck INR in 10 days- pt wanting to come into office. Call Coumadin Clinic [phone] if you have any changes in medications.  Written order faxed to [facility] [phone].

## 2020-01-13 ENCOUNTER — Ambulatory Visit (INDEPENDENT_AMBULATORY_CARE_PROVIDER_SITE_OTHER): Payer: Medicare Other | Admitting: Internal Medicine

## 2020-01-13 DIAGNOSIS — Z5181 Encounter for therapeutic drug level monitoring: Secondary | ICD-10-CM

## 2020-01-13 DIAGNOSIS — I2782 Chronic pulmonary embolism: Secondary | ICD-10-CM | POA: Diagnosis not present

## 2020-01-13 LAB — PROTIME-INR: INR: 1.6 — AB (ref 0.9–1.1)

## 2020-01-13 NOTE — Patient Instructions (Signed)
Description   Spoke with pt and instructed pt to take 1.5 tablets today and tomorrow, then start taking 1 tablet daily except 1/2 tablet on Thursdays. Recheck INR in 10 days in office visit. Call Coumadin Clinic [phone] if you have any changes in medications.[facility] [phone].

## 2020-01-17 ENCOUNTER — Telehealth (INDEPENDENT_AMBULATORY_CARE_PROVIDER_SITE_OTHER): Payer: Medicare Other | Admitting: Cardiology

## 2020-01-17 VITALS — BP 136/75 | HR 57 | Ht 68.0 in | Wt 218.6 lb

## 2020-01-17 DIAGNOSIS — I480 Paroxysmal atrial fibrillation: Secondary | ICD-10-CM

## 2020-01-17 DIAGNOSIS — R600 Localized edema: Secondary | ICD-10-CM | POA: Diagnosis not present

## 2020-01-17 DIAGNOSIS — Z7901 Long term (current) use of anticoagulants: Secondary | ICD-10-CM

## 2020-01-17 DIAGNOSIS — R0602 Shortness of breath: Secondary | ICD-10-CM | POA: Diagnosis not present

## 2020-01-17 NOTE — Patient Instructions (Signed)
Medication Instructions:  Your Physician recommend you continue on your current medication as directed.    *If you need a refill on your cardiac medications before your next appointment, please call your pharmacy*   Lab Work: None   Testing/Procedures: None   Follow-Up: At [facility], you and your health needs are our priority.  As part of our continuing mission to provide you with exceptional heart care, we have created designated Provider Care Teams.  These Care Teams include your primary Cardiologist (physician) and Advanced Practice Providers (APPs -  Physician Assistants and Nurse Practitioners) who all work together to provide you with the care you need, when you need it.  We recommend signing up for the patient portal called "MyChart".  Sign up information is provided on this After Visit Summary.  MyChart is used to connect with patients for Virtual Visits (Telemedicine).  Patients are able to view lab/test results, encounter notes, upcoming appointments, etc.  Non-urgent messages can be sent to your provider as well.   To learn more about what you can do with MyChart, go to [email or web address].    Your next appointment:   1 month(s)  The format for your next appointment:   Virtual Visit   Provider:   Dr. Harrell Gave

## 2020-01-17 NOTE — Progress Notes (Signed)
Virtual Visit via Video Note   This visit type was conducted due to national recommendations for restrictions regarding the COVID-19 Pandemic (e.g. social distancing) in an effort to limit this patient's exposure and mitigate transmission in our community.  Due to her co-morbid illnesses, this patient is at least at moderate risk for complications without adequate follow up.  This format is felt to be most appropriate for this patient at this time.  All issues noted in this document were discussed and addressed.  A limited physical exam was performed with this format.  Please refer to the patient's chart for her consent to telehealth for Debbie [name].   The patient was identified using 2 identifiers.  Date:  01/17/2020   ID:  [name], DOB [birth year], MRN [id number]  Patient Location: Home Provider Location: Home  PCP:  [name], [name], MD  Cardiologist:  Buford Dresser, MD  Electrophysiologist:  None   Evaluation Performed:  Follow-Up Visit  Chief Complaint:  Follow up  History of Present Illness:    Debbie [name] is a 74 y.o. female with a hx of persistent atrial fibrillation s/p ablation--now paroxysmal, pulmonary embolism (2017), hyperlipidemia, chronic kidney disease stage III, obesity, sleep apnea not on CPAP who is seen in follow up.   She was previously seen by Dr. Saunders Revel on 05/28/2018. At that visit, the plan was to continue anticoagulation, continue diltiazem (both standing and as needed). Recent monitor did not show sustained arrhythmias. She declined treatment for her OSA. I first met her on 09/03/18. Noted that she is very symptomatic with her atrial fibrillation.  The patient does not have symptoms concerning for COVID-19 infection (fever, chills, cough, or new shortness of breath).   She had Covid, as did the other three people in her house. This was likely late December/early January, as her husband was admitted for fracture in early January and tested  Covid positive. She had fevers, chills, shortness of breath. Called EMS, but was told there were no beds in the hospital, so she stayed home. Used albuterol at home, which put her into afib.  Took one lasix pill, went into afib for four days. Has noted that her heart rate has been up and down. Highest has been 128, range has been largely 90s-100s. Last bout of afib was 2/27, pulse has been improved since then.  Continues to have LE edema during the day, but her legs go down at night. Weight has been stable. Highest weight 3/7 at 220 lbs, has been usually lower than this. Her legs   ROS positive for severe back pain after reaching to open a window.    Past Medical History:  Diagnosis Date  . Arthritis   . Atrial fibrillation (Debbie [name])   . Benign essential tremor   . Breast cancer ([place])   . Cataract    Bilateral 2018  . Chronic renal insufficiency   . Common migraine 05/07/2015  . Complication of anesthesia   . Depression with anxiety   . Drowsiness    Excessive daytime  . Dyslipidemia   . Fibromyalgia   . History of partial seizures   . Hypertension   . Memory disturbance   . Muscle tension headache   . Obesity   . Obstructive sleep apnea   . PONV (postoperative nausea and vomiting)   . S/P epidural steroid injection   . Sacral contusion 07/2018  . Seizures ([facility])    Partial  . Sleep apnea    does not wear  CPAP  . Sprain of neck 06/26/2013  . Tremors of nervous [name]   . Vitamin B12 deficiency    Past Surgical History:  Procedure Laterality Date  . ABDOMINAL HYSTERECTOMY    . ablation procedure     Cardiac, for Afib  . BASAL CELL CARCINOMA EXCISION    . BASAL CELL CARCINOMA EXCISION    . BRAIN MENINGIOMA EXCISION Right   . BREAST RECONSTRUCTION Bilateral   . breast reconstructive    . CATARACT EXTRACTION, BILATERAL    . CHOLECYSTECTOMY    . DILATION AND CURETTAGE OF UTERUS    . dilution    . ESOPHAGEAL MANOMETRY N/A 12/14/2016   Procedure: ESOPHAGEAL MANOMETRY (EM);   Surgeon: Mauri Pole, MD;  Location: [facility];  Service: Endoscopy;  Laterality: N/A;  . gallbladder resection    . LUMBAR SPINE SURGERY N/A   . lumbosacral    . mastectomies Bilateral   . NOSE SURGERY     Basal cell carcinoma resection from the nose  . TUBAL LIGATION N/A      Current Meds  Medication Sig  . acetaminophen (TYLENOL) 325 MG tablet Take 2 tablets (650 mg total) by mouth every 6 (six) hours.  . Ascorbic Acid (VITAMIN C PO) Take by mouth.  . calcium carbonate (OS-CAL) 600 MG TABS tablet Take 600 mg by mouth daily with breakfast.  . diltiazem (CARDIZEM CD) 240 MG 24 hr capsule TAKE 1 CAPSULE (240 MG TOTAL) BY MOUTH EVERY OTHER DAY. ROTATING WITH DILTIAZEM 360  . diltiazem (CARDIZEM CD) 360 MG 24 hr capsule Take 360 mg by mouth daily. Alternated with 240 mg  . furosemide (LASIX) 40 MG tablet Take 1 tablet (40 mg total) by mouth as needed.  . gabapentin (NEURONTIN) 600 MG tablet Take 1 tablet (600 mg total) by mouth 3 (three) times daily.  Marland Kitchen HYDROcodone-acetaminophen (NORCO/VICODIN) 5-325 MG tablet Take 1 tablet by mouth 3 (three) times daily as needed for moderate pain.   Marland Kitchen lamoTRIgine (LAMICTAL) 25 MG tablet TAKE 1 TABLET TWICE A DAY  . LEVALBUTEROL HCL IN Inhale into the lungs QID.  . Multiple Vitamins-Minerals (ZINC PO) Take by mouth.  . nystatin (MYCOSTATIN/NYSTOP) powder Apply 1 application topically 3 (three) times daily as needed.  . polyethylene glycol (MIRALAX / GLYCOLAX) packet Take 17 g by mouth as needed.  . Vitamin D, Ergocalciferol, (DRISDOL) 1.25 MG (50000 UT) CAPS capsule TAKE 1 CAPSULE BY MOUTH EVERY 10 DAYS  . warfarin (COUMADIN) 5 MG tablet Take 1/2 to 1 tablet by mouth daily as directd     Allergies:   Diclofenac, Atorvastatin, Tape, Albuterol, Mushroom extract complex, Tramadol, Buprenorphine hcl, Codeine, Fish oil, and Morphine and related   Social History   Tobacco Use  . Smoking status: Never Smoker  . Smokeless tobacco: Never Used    Substance Use Topics  . Alcohol use: No  . Drug use: No     Family Hx: The patient's family history includes Arthritis in her father and mother; Breast cancer in her sister; Cancer in her father; Cancer - Colon in her father; Colon cancer in her father; Diabetes in her maternal grandfather and mother; Heart disease in her mother; Heart failure in her maternal grandfather and mother; Hyperlipidemia in her mother; Hypertension in her mother; Rectal cancer in her maternal uncle; Stroke in her maternal grandmother and mother. There is no history of Lung disease, Esophageal cancer, or Stomach cancer.  ROS:   Please see the history of present illness.  All other systems reviewed and are negative.  Prior CV studies:   The following studies were reviewed today: Cath/PCI:  LHC(greater than 10 years ago in [place], [place]): Normal per patient's report.  CV Surgery:  None  EP Procedures and Devices:  48-hour Holter monitor (05/03/18): Predominantly sinus rhythm with rare PACs, PVCs, and brief atrial runs lasting up to 4 beats. No sustained arrhythmia.  30-day event monitor (12/29/16): Predominantly sinus rhythm without significant abnormalities, though quite a bit of artifact was noted.  Atrial fibrillation cryoablation (08/17/16, Dr. Minna Merritts, [facility])  Atrial fibrillation radiofrequency ablation (2014)  Non-Invasive Evaluation(s):  TTE (01/12/17): Normal LV size with mild LVH. LVEF 55-60% with grade 1 diastolic dysfunction. Moderate left atrial enlargement. Trivial pericardial effusion. Normal RV size and function. Normal CVP.  Pharmacologic myocardial perfusion stress test (12/29/16): Low-risk study with fixed mid and apical anterior/anteroseptal/septal defect most likely representing breast attenuation. Small basal inferolateral and basal anterolateral partially reversible defect that most likely represents shifting breast attenuation, though small area of ischemia cannot be  excluded. LVEF 55-65%.  Limited TTE (09/13/16): Grossly normal LV size and function. Small pericardial effusion.  Limited TTE (09/06/16): Mild to moderate pericardial effusion.  Limited TTE (08/18/16): Small pericardial effusion near RV.  Labs/Other Tests and Data Reviewed:    EKG:  An ECG dated 11/18/19 was personally reviewed today and demonstrated:  NSR  Recent Labs: 03/04/2019: Magnesium 2.2 03/05/2019: Hemoglobin 14.5; Platelets 156 10/29/2019: BNP 170.5; BUN 8; Creatinine, Ser 0.74; Potassium 4.5; Sodium 144; TSH 1.580   Recent Lipid Panel No results found for: CHOL, TRIG, HDL, CHOLHDL, LDLCALC, LDLDIRECT  Wt Readings from Last 3 Encounters:  01/17/20 218 lb 9.6 oz (99.2 kg)  10/29/19 231 lb 6.4 oz (105 kg)  06/27/19 237 lb (107.5 kg)     Objective:    Vital Signs:  BP 136/75   Pulse (!) 57   Ht '5\' 8"'$  (1.727 m)   Wt 218 lb 9.6 oz (99.2 kg)   BMI 33.24 kg/m    VITAL SIGNS:  reviewed GEN:  no acute distress EYES:  sclerae anicteric, EOMI - Extraocular Movements Intact RESPIRATORY:  normal respiratory effort, symmetric expansion CARDIOVASCULAR:  no visible JVD SKIN:  no rash, lesions or ulcers. MUSCULOSKELETAL:  no obvious deformities. NEURO:  alert and oriented x 3, no obvious focal deficit PSYCH:  normal affect  ASSESSMENT & PLAN:    Shortness of breath, chest tightness intermittently: confounded by history of Covid infection.  -lasix sent her into afib -feels that she is gradually improving, but will monitor closely  Bilateral trace LE edema: -reports largely unchanged -given symptoms, duration, high suspicion for venous insufficiency as cause  Paroxysmal atrial fibrillation, in sinus rhythm, on long term anticoagulation: -she is very symptomatic with her atrial fibrillation. Has been in sinus for several weeks -declines CPAP for OSA, cannot tolerate -CHA2DS2/VAS Stroke Risk Points  =5, currently on coumadin. Have discussed DOAC in the past  COVID-19  Education: The signs and symptoms of COVID-19 were discussed with the patient and how to seek care for testing (follow up with PCP or arrange E-visit).  The importance of social distancing was discussed today.  Time:   Today, I have spent 27 minutes with the patient with telehealth technology discussing the above problems.    Patient Instructions  Medication Instructions:  Your Physician recommend you continue on your current medication as directed.    *If you need a refill on your cardiac medications before your next appointment, please  call your pharmacy*   Lab Work: None   Testing/Procedures: None   Follow-Up: At [facility], you and your health needs are our priority.  As part of our continuing mission to provide you with exceptional heart care, we have created designated Provider Care Teams.  These Care Teams include your primary Cardiologist (physician) and Advanced Practice Providers (APPs -  Physician Assistants and Nurse Practitioners) who all work together to provide you with the care you need, when you need it.  We recommend signing up for the patient portal called "MyChart".  Sign up information is provided on this After Visit Summary.  MyChart is used to connect with patients for Virtual Visits (Telemedicine).  Patients are able to view lab/test results, encounter notes, upcoming appointments, etc.  Non-urgent messages can be sent to your provider as well.   To learn more about what you can do with MyChart, go to [email or web address].    Your next appointment:   1 month(s)  The format for your next appointment:   Virtual Visit   Provider:   Dr. Harrell Gave       Signed, Buford Dresser, MD  01/17/2020 9:35 AM    [place]

## 2020-01-19 ENCOUNTER — Other Ambulatory Visit: Payer: Self-pay | Admitting: Adult Health

## 2020-01-20 ENCOUNTER — Telehealth: Payer: Self-pay | Admitting: *Deleted

## 2020-01-20 NOTE — Telephone Encounter (Signed)
Pt called and stated she started a 7 day course of fluconazole 100 mg once a day. Pt stated that she could not come in on Wednesday to get her INR checked because she will be out of town taking her husband to an appointment. Instructed for pt to take 1/2 a tablet of warfarin today and then continue her normal dose 1 tablet daily. Instructed pt to eat an extra serving of greens this week. Instructed pt to seek medical attention if she notices any signs of bleeding. Pt has an appointment on 3/18 to get her INR checked.

## 2020-01-23 ENCOUNTER — Ambulatory Visit (INDEPENDENT_AMBULATORY_CARE_PROVIDER_SITE_OTHER): Payer: Medicare Other | Admitting: *Deleted

## 2020-01-23 ENCOUNTER — Other Ambulatory Visit: Payer: Self-pay

## 2020-01-23 DIAGNOSIS — I2782 Chronic pulmonary embolism: Secondary | ICD-10-CM | POA: Diagnosis not present

## 2020-01-23 DIAGNOSIS — Z7901 Long term (current) use of anticoagulants: Secondary | ICD-10-CM | POA: Diagnosis not present

## 2020-01-23 DIAGNOSIS — Z5181 Encounter for therapeutic drug level monitoring: Secondary | ICD-10-CM

## 2020-01-23 DIAGNOSIS — Z86711 Personal history of pulmonary embolism: Secondary | ICD-10-CM

## 2020-01-23 LAB — POCT INR: INR: 2.9 (ref 2.0–3.0)

## 2020-01-23 NOTE — Patient Instructions (Addendum)
Description   Continue taking 1 tablet daily except 1/2 tablet on Thursdays. Recheck INR in 3 weeks in the office. Call [facility] [phone] if you have any changes in medications.

## 2020-01-24 DIAGNOSIS — Z79899 Other long term (current) drug therapy: Secondary | ICD-10-CM | POA: Diagnosis not present

## 2020-01-24 DIAGNOSIS — M545 Low back pain: Secondary | ICD-10-CM | POA: Diagnosis not present

## 2020-01-24 DIAGNOSIS — G8929 Other chronic pain: Secondary | ICD-10-CM | POA: Diagnosis not present

## 2020-01-24 DIAGNOSIS — G894 Chronic pain syndrome: Secondary | ICD-10-CM | POA: Diagnosis not present

## 2020-02-03 DIAGNOSIS — M7061 Trochanteric bursitis, right hip: Secondary | ICD-10-CM | POA: Diagnosis not present

## 2020-02-09 ENCOUNTER — Other Ambulatory Visit: Payer: Self-pay | Admitting: Cardiology

## 2020-02-13 ENCOUNTER — Ambulatory Visit (INDEPENDENT_AMBULATORY_CARE_PROVIDER_SITE_OTHER): Payer: Medicare Other | Admitting: *Deleted

## 2020-02-13 ENCOUNTER — Other Ambulatory Visit: Payer: Self-pay

## 2020-02-13 DIAGNOSIS — Z7901 Long term (current) use of anticoagulants: Secondary | ICD-10-CM | POA: Diagnosis not present

## 2020-02-13 DIAGNOSIS — Z5181 Encounter for therapeutic drug level monitoring: Secondary | ICD-10-CM

## 2020-02-13 DIAGNOSIS — Z86711 Personal history of pulmonary embolism: Secondary | ICD-10-CM

## 2020-02-13 LAB — POCT INR: INR: 2.2 (ref 2.0–3.0)

## 2020-02-13 NOTE — Patient Instructions (Signed)
Description   Continue taking 1 tablet daily except 1/2 tablet on Thursdays. Recheck INR in 4 weeks in the office. Call [facility] [phone] if you have any changes in medications.

## 2020-02-24 ENCOUNTER — Other Ambulatory Visit: Payer: Self-pay | Admitting: Cardiology

## 2020-02-24 DIAGNOSIS — M545 Low back pain: Secondary | ICD-10-CM | POA: Diagnosis not present

## 2020-02-24 DIAGNOSIS — G8929 Other chronic pain: Secondary | ICD-10-CM | POA: Diagnosis not present

## 2020-02-24 DIAGNOSIS — G894 Chronic pain syndrome: Secondary | ICD-10-CM | POA: Diagnosis not present

## 2020-02-24 DIAGNOSIS — Z79899 Other long term (current) drug therapy: Secondary | ICD-10-CM | POA: Diagnosis not present

## 2020-02-26 ENCOUNTER — Telehealth (INDEPENDENT_AMBULATORY_CARE_PROVIDER_SITE_OTHER): Payer: Medicare Other | Admitting: Cardiology

## 2020-02-26 ENCOUNTER — Encounter: Payer: Self-pay | Admitting: Cardiology

## 2020-02-26 VITALS — BP 139/81 | HR 54 | Ht 69.0 in | Wt 216.6 lb

## 2020-02-26 DIAGNOSIS — I10 Essential (primary) hypertension: Secondary | ICD-10-CM | POA: Diagnosis not present

## 2020-02-26 DIAGNOSIS — R600 Localized edema: Secondary | ICD-10-CM | POA: Diagnosis not present

## 2020-02-26 DIAGNOSIS — R609 Edema, unspecified: Secondary | ICD-10-CM

## 2020-02-26 DIAGNOSIS — Z7901 Long term (current) use of anticoagulants: Secondary | ICD-10-CM

## 2020-02-26 DIAGNOSIS — I480 Paroxysmal atrial fibrillation: Secondary | ICD-10-CM | POA: Diagnosis not present

## 2020-02-26 NOTE — Patient Instructions (Signed)

## 2020-02-26 NOTE — Progress Notes (Signed)
Virtual Visit via Telephone Note   This visit type was conducted due to national recommendations for restrictions regarding the COVID-19 Pandemic (e.g. social distancing) in an effort to limit this patient's exposure and mitigate transmission in our community.  Due to her co-morbid illnesses, this patient is at least at moderate risk for complications without adequate follow up.  This format is felt to be most appropriate for this patient at this time.  The patient did not have access to video technology/had technical difficulties with video requiring transitioning to audio format only (telephone).  All issues noted in this document were discussed and addressed.  No physical exam could be performed with this format.  Please refer to the patient's chart for her  consent to telehealth for [facility].   The patient was identified using 2 identifiers.  Date:  02/26/2020   ID:  MARK BENECKE, DOB 1946/07/29, MRN [id number]  Patient Location: Home Provider Location: Home  PCP:  [name], [name], MD  Cardiologist:  Buford Dresser, MD  Electrophysiologist:  None   Evaluation Performed:  Follow-Up Visit  Chief Complaint:  Follow up  History of Present Illness:    Debbie [name] is a 74 y.o. female with a hx of persistent atrial fibrillation s/p ablation--now paroxysmal, pulmonary embolism (2017), hyperlipidemia, chronic kidney disease stage III, obesity, sleep apnea not on CPAP who is seen in follow up.   She was previously seen by Dr. Saunders Revel on 05/28/2018. At that visit, the plan was to continue anticoagulation, continue diltiazem (both standing and as needed). Recent monitor did not show sustained arrhythmias. She declined treatment for her OSA. I first met her on 09/03/18. Noted that she is very symptomatic with her atrial fibrillation.  The patient does not have current symptoms concerning for COVID-19 infection (fever, chills, cough, or new shortness of breath).    Today: Overall doing well, other than fibromyalgia has been flaring. Had an injection with only mild improvement. Legs still swelling during the day, goes away at night and/or with leg elevation. Scared to take lasix as last time it put her heart into afib. Weight has ranged from 216-220 lbs, steady. Tries to wear compression stockings as much as she tolerates. Discussed strategies for LE edema again today.  Hasn't been in afib in about a month. Feels much better in sinus rhythm  Husband has been struggling with his medical issues recently, limiting activity.  Planning to get Pfizer vaccine through the [place] soon. Discussed that it is difficult to predict who will get afib post vaccination and not.  Breathing has been stable, O2 sats >95%. Allergies seem to be bad this year, masking helps.  Past Medical History:  Diagnosis Date  . Arthritis   . Atrial fibrillation ([place])   . Benign essential tremor   . Breast cancer ([place])   . Cataract    Bilateral 2018  . Chronic renal insufficiency   . Common migraine 05/07/2015  . Complication of anesthesia   . Depression with anxiety   . Drowsiness    Excessive daytime  . Dyslipidemia   . Fibromyalgia   . History of partial seizures   . Hypertension   . Memory disturbance   . Muscle tension headache   . Obesity   . Obstructive sleep apnea   . PONV (postoperative nausea and vomiting)   . S/P epidural steroid injection   . Sacral contusion 07/2018  . Seizures ([facility])    Partial  . Sleep apnea  does not wear CPAP  . Sprain of neck 06/26/2013  . Tremors of nervous system   . Vitamin B12 deficiency    Past Surgical History:  Procedure Laterality Date  . ABDOMINAL HYSTERECTOMY    . ablation procedure     Cardiac, for Afib  . BASAL CELL CARCINOMA EXCISION    . BASAL CELL CARCINOMA EXCISION    . BRAIN MENINGIOMA EXCISION Right   . BREAST RECONSTRUCTION Bilateral   . breast reconstructive    . CATARACT EXTRACTION, BILATERAL    .  CHOLECYSTECTOMY    . DILATION AND CURETTAGE OF UTERUS    . dilution    . ESOPHAGEAL MANOMETRY N/A 12/14/2016   Procedure: ESOPHAGEAL MANOMETRY (EM);  Surgeon: Mauri Pole, MD;  Location: [facility];  Service: Endoscopy;  Laterality: N/A;  . gallbladder resection    . LUMBAR SPINE SURGERY N/A   . lumbosacral    . mastectomies Bilateral   . NOSE SURGERY     Basal cell carcinoma resection from the nose  . TUBAL LIGATION N/A      Current Meds  Medication Sig  . acetaminophen (TYLENOL) 325 MG tablet Take 2 tablets (650 mg total) by mouth every 6 (six) hours.  . Ascorbic Acid (VITAMIN C PO) Take by mouth.  . calcium carbonate (OS-CAL) 600 MG TABS tablet Take 600 mg by mouth daily with breakfast.  . diltiazem (CARDIZEM CD) 240 MG 24 hr capsule TAKE 1 CAPSULE (240 MG TOTAL) BY MOUTH EVERY OTHER DAY. ROTATING WITH DILTIAZEM 360  . furosemide (LASIX) 40 MG tablet Take 1 tablet (40 mg total) by mouth as needed.  . gabapentin (NEURONTIN) 600 MG tablet Take 1 tablet (600 mg total) by mouth 3 (three) times daily.  Marland Kitchen HYDROcodone-acetaminophen (NORCO/VICODIN) 5-325 MG tablet Take 1 tablet by mouth 3 (three) times daily as needed for moderate pain.   Marland Kitchen lamoTRIgine (LAMICTAL) 25 MG tablet TAKE 1 TABLET BY MOUTH TWICE A DAY  . LEVALBUTEROL HCL IN Inhale into the lungs as needed.   . Multiple Vitamins-Minerals (ZINC PO) Take by mouth.  . nystatin (MYCOSTATIN/NYSTOP) powder Apply 1 application topically 3 (three) times daily as needed.  . polyethylene glycol (MIRALAX / GLYCOLAX) packet Take 17 g by mouth as needed.  . Vitamin D, Ergocalciferol, (DRISDOL) 1.25 MG (50000 UT) CAPS capsule TAKE 1 CAPSULE BY MOUTH EVERY 10 DAYS  . warfarin (COUMADIN) 5 MG tablet TAKE 1/2 TO 1 TABLET BY MOUTH DAILY AS DIRECTED     Allergies:   Diclofenac, Atorvastatin, Tape, Albuterol, Mushroom extract complex, Tramadol, Buprenorphine hcl, Codeine, Fish oil, and Morphine and related   Social History   Tobacco Use  .  Smoking status: Never Smoker  . Smokeless tobacco: Never Used  Substance Use Topics  . Alcohol use: No  . Drug use: No     Family Hx: The patient's family history includes Arthritis in her father and mother; Breast cancer in her sister; Cancer in her father; Cancer - Colon in her father; Colon cancer in her father; Diabetes in her maternal grandfather and mother; Heart disease in her mother; Heart failure in her maternal grandfather and mother; Hyperlipidemia in her mother; Hypertension in her mother; Rectal cancer in her maternal uncle; Stroke in her maternal grandmother and mother. There is no history of Lung disease, Esophageal cancer, or Stomach cancer.  ROS:   Please see the history of present illness.    All other systems reviewed and are negative.   Prior CV studies:  The following studies were reviewed today: Cath/PCI:  LHC(greater than 10 years ago in [place], [place]): Normal per patient's report.  CV Surgery:  None  EP Procedures and Devices:  48-hour Holter monitor (05/03/18): Predominantly sinus rhythm with rare PACs, PVCs, and brief atrial runs lasting up to 4 beats. No sustained arrhythmia.  30-day event monitor (12/29/16): Predominantly sinus rhythm without significant abnormalities, though quite a bit of artifact was noted.  Atrial fibrillation cryoablation (08/17/16, Dr. Minna Merritts, [facility])  Atrial fibrillation radiofrequency ablation (2014)  Non-Invasive Evaluation(s):  TTE (01/12/17): Normal LV size with mild LVH. LVEF 55-60% with grade 1 diastolic dysfunction. Moderate left atrial enlargement. Trivial pericardial effusion. Normal RV size and function. Normal CVP.  Pharmacologic myocardial perfusion stress test (12/29/16): Low-risk study with fixed mid and apical anterior/anteroseptal/septal defect most likely representing breast attenuation. Small basal inferolateral and basal anterolateral partially reversible defect that most likely represents shifting  breast attenuation, though small area of ischemia cannot be excluded. LVEF 55-65%.  Limited TTE (09/13/16): Grossly normal LV size and function. Small pericardial effusion.  Limited TTE (09/06/16): Mild to moderate pericardial effusion.  Limited TTE (08/18/16): Small pericardial effusion near RV.  Labs/Other Tests and Data Reviewed:    EKG:  An ECG dated 11/18/19 was personally reviewed today and demonstrated:  NSR  Recent Labs: 03/04/2019: Magnesium 2.2 03/05/2019: Hemoglobin 14.5; Platelets 156 10/29/2019: BNP 170.5; BUN 8; Creatinine, Ser 0.74; Potassium 4.5; Sodium 144; TSH 1.580   Recent Lipid Panel No results found for: CHOL, TRIG, HDL, CHOLHDL, LDLCALC, LDLDIRECT  Wt Readings from Last 3 Encounters:  02/26/20 216 lb 9.6 oz (98.2 kg)  01/17/20 218 lb 9.6 oz (99.2 kg)  10/29/19 231 lb 6.4 oz (105 kg)     Objective:    Vital Signs:  BP 139/81   Pulse (!) 54   Ht '5\' 9"'  (1.753 m)   Wt 216 lb 9.6 oz (98.2 kg)   BMI 31.99 kg/m    Speaking comfortably on the phone, no audible wheezing In no acute distress Alert and oriented Normal affect Normal speech  ASSESSMENT & PLAN:    Intermittent shortness of breath, chest tightness: has not been very active due to husband's health issues, but feels this is stable. Much better when in sinus rhythm -counseled on red flag warning signs that need immediate medical attention  Bilateral trace LE edema:  -weights stable -dependent, resolves with elevation and compression -may be component of chronic venous insufficiency  Paroxysmal atrial fibrillation, in sinus rhythm, on long term anticoagulation: -she is very symptomatic with her atrial fibrillation. Feels she has been in NSR largely for about the last month.  -declines CPAP for OSA, cannot tolerate -regular, rate controlled largely per report -CHA2DS2/VAS Stroke Risk Points  =5, currently on coumadin. Have discussed DOAC in the past  Hypertension: well controlled  historically. Continue current management.  COVID-19 Education: The signs and symptoms of COVID-19 were discussed with the patient and how to seek care for testing (follow up with PCP or arrange E-visit).  The importance of social distancing was discussed today.  Time:   Today, I have spent 16 minutes with the patient with telehealth technology discussing the above problems.    Patient Instructions  Medication Instructions:  Your Physician recommend you continue on your current medication as directed.     *If you need a refill on your cardiac medications before your next appointment, please call your pharmacy*   Lab Work: None   Testing/Procedures: None   Follow-Up:  At [facility], you and your health needs are our priority.  As part of our continuing mission to provide you with exceptional heart care, we have created designated Provider Care Teams.  These Care Teams include your primary Cardiologist (physician) and Advanced Practice Providers (APPs -  Physician Assistants and Nurse Practitioners) who all work together to provide you with the care you need, when you need it.  We recommend signing up for the patient portal called "MyChart".  Sign up information is provided on this After Visit Summary.  MyChart is used to connect with patients for Virtual Visits (Telemedicine).  Patients are able to view lab/test results, encounter notes, upcoming appointments, etc.  Non-urgent messages can be sent to your provider as well.   To learn more about what you can do with MyChart, go to [email or web address].    Your next appointment:   6 month(s)  The format for your next appointment:   In Person  Provider:   Buford Dresser, MD       Signed, Buford Dresser, MD  02/26/2020 1:07 PM    [place]

## 2020-03-06 ENCOUNTER — Encounter: Payer: Self-pay | Admitting: Cardiology

## 2020-03-12 ENCOUNTER — Other Ambulatory Visit: Payer: Self-pay

## 2020-03-12 ENCOUNTER — Ambulatory Visit (INDEPENDENT_AMBULATORY_CARE_PROVIDER_SITE_OTHER): Payer: Medicare Other | Admitting: *Deleted

## 2020-03-12 DIAGNOSIS — Z5181 Encounter for therapeutic drug level monitoring: Secondary | ICD-10-CM

## 2020-03-12 DIAGNOSIS — Z86711 Personal history of pulmonary embolism: Secondary | ICD-10-CM | POA: Diagnosis not present

## 2020-03-12 DIAGNOSIS — I4891 Unspecified atrial fibrillation: Secondary | ICD-10-CM

## 2020-03-12 DIAGNOSIS — Z7901 Long term (current) use of anticoagulants: Secondary | ICD-10-CM | POA: Diagnosis not present

## 2020-03-12 LAB — POCT INR: INR: 2 (ref 2.0–3.0)

## 2020-03-12 NOTE — Patient Instructions (Signed)
Description   Take 1 tablet today and then continue taking 1 tablet daily except 1/2 tablet on Thursdays. Recheck INR in 4 weeks in the office. Call [facility] [phone] if you have any changes in medications.

## 2020-03-31 ENCOUNTER — Other Ambulatory Visit: Payer: Self-pay | Admitting: Cardiology

## 2020-03-31 DIAGNOSIS — G894 Chronic pain syndrome: Secondary | ICD-10-CM | POA: Diagnosis not present

## 2020-03-31 DIAGNOSIS — Z79899 Other long term (current) drug therapy: Secondary | ICD-10-CM | POA: Diagnosis not present

## 2020-03-31 DIAGNOSIS — M5136 Other intervertebral disc degeneration, lumbar region: Secondary | ICD-10-CM | POA: Diagnosis not present

## 2020-04-06 ENCOUNTER — Other Ambulatory Visit: Payer: Self-pay | Admitting: Adult Health

## 2020-04-06 ENCOUNTER — Other Ambulatory Visit: Payer: Self-pay | Admitting: Cardiology

## 2020-04-08 ENCOUNTER — Other Ambulatory Visit: Payer: Self-pay | Admitting: Cardiology

## 2020-04-08 MED ORDER — DILTIAZEM HCL ER BEADS 360 MG PO CP24: 360.0000 mg | ORAL_CAPSULE | ORAL | 6 refills | Status: DC

## 2020-04-08 NOTE — Telephone Encounter (Signed)
*  STAT* If patient is at the pharmacy, call can be transferred to refill team.   1. Which medications need to be refilled? (please list name of each medication and dose if known) Diltiazem 360 mg, take 1 pill everyother day   2. Which pharmacy/location (including street and city if local pharmacy) is medication to be sent to? [facility] [place], [place] - [address]  3. Do they need a 30 day or 90 day supply? 30 day

## 2020-04-08 NOTE — Addendum Note (Signed)
Addended by: Venetia Maxon on: 04/08/2020 01:47 PM   Modules accepted: Orders

## 2020-04-09 ENCOUNTER — Ambulatory Visit (INDEPENDENT_AMBULATORY_CARE_PROVIDER_SITE_OTHER): Payer: Medicare Other | Admitting: *Deleted

## 2020-04-09 ENCOUNTER — Other Ambulatory Visit: Payer: Self-pay

## 2020-04-09 DIAGNOSIS — Z7901 Long term (current) use of anticoagulants: Secondary | ICD-10-CM | POA: Diagnosis not present

## 2020-04-09 DIAGNOSIS — Z86711 Personal history of pulmonary embolism: Secondary | ICD-10-CM

## 2020-04-09 DIAGNOSIS — Z5181 Encounter for therapeutic drug level monitoring: Secondary | ICD-10-CM

## 2020-04-09 DIAGNOSIS — I2782 Chronic pulmonary embolism: Secondary | ICD-10-CM | POA: Diagnosis not present

## 2020-04-09 LAB — POCT INR: INR: 2 (ref 2.0–3.0)

## 2020-04-09 NOTE — Patient Instructions (Addendum)
Description   Take 1 tablet today, then start taking 1 tablet daily. Recheck INR in 4 weeks. Call [facility] [phone] if you have any changes in medications.

## 2020-05-07 ENCOUNTER — Other Ambulatory Visit: Payer: Self-pay

## 2020-05-07 ENCOUNTER — Ambulatory Visit (INDEPENDENT_AMBULATORY_CARE_PROVIDER_SITE_OTHER): Payer: Medicare Other

## 2020-05-07 DIAGNOSIS — Z86711 Personal history of pulmonary embolism: Secondary | ICD-10-CM | POA: Diagnosis not present

## 2020-05-07 DIAGNOSIS — Z7901 Long term (current) use of anticoagulants: Secondary | ICD-10-CM

## 2020-05-07 DIAGNOSIS — I2782 Chronic pulmonary embolism: Secondary | ICD-10-CM

## 2020-05-07 DIAGNOSIS — Z5181 Encounter for therapeutic drug level monitoring: Secondary | ICD-10-CM

## 2020-05-07 LAB — POCT INR: INR: 2.5 (ref 2.0–3.0)

## 2020-05-07 NOTE — Patient Instructions (Signed)
Continue taking 1 tablet daily. Recheck INR in 5 weeks. Call [facility] Clinic [phone] if you have any changes in medications.

## 2020-05-11 ENCOUNTER — Other Ambulatory Visit: Payer: Self-pay | Admitting: Adult Health

## 2020-05-25 DIAGNOSIS — M797 Fibromyalgia: Secondary | ICD-10-CM | POA: Diagnosis not present

## 2020-05-25 DIAGNOSIS — R2689 Other abnormalities of gait and mobility: Secondary | ICD-10-CM | POA: Diagnosis not present

## 2020-05-25 DIAGNOSIS — M6281 Muscle weakness (generalized): Secondary | ICD-10-CM | POA: Diagnosis not present

## 2020-05-26 DIAGNOSIS — Z79899 Other long term (current) drug therapy: Secondary | ICD-10-CM | POA: Diagnosis not present

## 2020-05-26 DIAGNOSIS — M545 Low back pain: Secondary | ICD-10-CM | POA: Diagnosis not present

## 2020-05-26 DIAGNOSIS — G894 Chronic pain syndrome: Secondary | ICD-10-CM | POA: Diagnosis not present

## 2020-05-26 DIAGNOSIS — G8929 Other chronic pain: Secondary | ICD-10-CM | POA: Diagnosis not present

## 2020-05-27 ENCOUNTER — Ambulatory Visit: Payer: Medicare Other

## 2020-06-03 DIAGNOSIS — M6281 Muscle weakness (generalized): Secondary | ICD-10-CM | POA: Diagnosis not present

## 2020-06-03 DIAGNOSIS — R2689 Other abnormalities of gait and mobility: Secondary | ICD-10-CM | POA: Diagnosis not present

## 2020-06-03 DIAGNOSIS — M797 Fibromyalgia: Secondary | ICD-10-CM | POA: Diagnosis not present

## 2020-06-09 ENCOUNTER — Telehealth: Payer: Self-pay | Admitting: *Deleted

## 2020-06-09 DIAGNOSIS — M25551 Pain in right hip: Secondary | ICD-10-CM | POA: Diagnosis not present

## 2020-06-09 NOTE — Telephone Encounter (Signed)
Pt stated that she would not be able to come to  appointment on 06/11/2020. Pt stated that she is being followed by remote health. Will fax order to remote health to checks pt's INR on 06/11/2020.

## 2020-06-11 DIAGNOSIS — Z86711 Personal history of pulmonary embolism: Secondary | ICD-10-CM | POA: Diagnosis not present

## 2020-06-11 LAB — POCT INR: INR: 2.4 (ref 2.0–3.0)

## 2020-06-12 ENCOUNTER — Ambulatory Visit (INDEPENDENT_AMBULATORY_CARE_PROVIDER_SITE_OTHER): Payer: Medicare Other

## 2020-06-12 DIAGNOSIS — Z7901 Long term (current) use of anticoagulants: Secondary | ICD-10-CM

## 2020-06-12 DIAGNOSIS — I2782 Chronic pulmonary embolism: Secondary | ICD-10-CM | POA: Diagnosis not present

## 2020-06-12 DIAGNOSIS — Z5181 Encounter for therapeutic drug level monitoring: Secondary | ICD-10-CM

## 2020-06-12 DIAGNOSIS — Z86711 Personal history of pulmonary embolism: Secondary | ICD-10-CM | POA: Diagnosis not present

## 2020-06-12 NOTE — Patient Instructions (Signed)
Description   Called spoke with pt advised to continue taking 1 tablet daily. Recheck INR in 6 weeks. Pt states she should be able to return to clinic at that time, made appt to follow-up here in the office.  Call [facility] [phone] if you have any changes in medications.

## 2020-06-16 DIAGNOSIS — M797 Fibromyalgia: Secondary | ICD-10-CM | POA: Diagnosis not present

## 2020-06-16 DIAGNOSIS — M6281 Muscle weakness (generalized): Secondary | ICD-10-CM | POA: Diagnosis not present

## 2020-06-16 DIAGNOSIS — R2689 Other abnormalities of gait and mobility: Secondary | ICD-10-CM | POA: Diagnosis not present

## 2020-06-17 ENCOUNTER — Other Ambulatory Visit: Payer: Self-pay

## 2020-06-17 ENCOUNTER — Ambulatory Visit (INDEPENDENT_AMBULATORY_CARE_PROVIDER_SITE_OTHER): Payer: Medicare Other

## 2020-06-17 VITALS — BP 135/74 | HR 58 | Ht 69.0 in | Wt 214.8 lb

## 2020-06-17 DIAGNOSIS — Z23 Encounter for immunization: Secondary | ICD-10-CM

## 2020-06-17 DIAGNOSIS — E2839 Other primary ovarian failure: Secondary | ICD-10-CM

## 2020-06-17 DIAGNOSIS — Z0000 Encounter for general adult medical examination without abnormal findings: Secondary | ICD-10-CM | POA: Diagnosis not present

## 2020-06-17 NOTE — Patient Instructions (Signed)
Debbie [name] , Thank you for taking time to come for your Medicare Wellness Visit. I appreciate your ongoing commitment to your health goals. Please review the following plan we discussed and let me know if I can assist you in the future.   Screening recommendations/referrals: Colonoscopy: Done 06/27/19 Mammogram: No longer required Bone Density: Ordered 06/17/20 Recommended yearly ophthalmology/optometry visit for glaucoma screening and checkup Recommended yearly dental visit for hygiene and checkup  Vaccinations: Influenza vaccine: Up to date Pneumococcal vaccine: Up to date Tdap vaccine: Due with information discussed Shingles vaccine: Shingrix discussed. Please contact your pharmacy for coverage information.    Covid-19:Pt stated only received 1st dose of moderna with Ill effects, declined 2nd dose  Advanced directives: Please bring a copy of your health care power of attorney and living will to the office at your convenience.   Conditions/risks identified: Started pool therapy  Next appointment: Follow up in one year for your annual wellness visit   Preventive Care 65 Years and Older, Female Preventive care refers to lifestyle choices and visits with your health care provider that can promote health and wellness. What does preventive care include?  A yearly physical exam. This is also called an annual well check.  Dental exams once or twice a year.  Routine eye exams. Ask your health care provider how often you should have your eyes checked.  Personal lifestyle choices, including:  Daily care of your teeth and gums.  Regular physical activity.  Eating a healthy diet.  Avoiding tobacco and drug use.  Limiting alcohol use.  Practicing safe sex.  Taking low-dose aspirin every day.  Taking vitamin and mineral supplements as recommended by your health care provider. What happens during an annual well check? The services and screenings done by your health care provider  during your annual well check will depend on your age, overall health, lifestyle risk factors, and family history of disease. Counseling  Your health care provider may ask you questions about your:  Alcohol use.  Tobacco use.  Drug use.  Emotional well-being.  Home and relationship well-being.  Sexual activity.  Eating habits.  History of falls.  Memory and ability to understand (cognition).  Work and work Statistician.  Reproductive health. Screening  You may have the following tests or measurements:  Height, weight, and BMI.  Blood pressure.  Lipid and cholesterol levels. These may be checked every 5 years, or more frequently if you are over 78 years old.  Skin check.  Lung cancer screening. You may have this screening every year starting at age 38 if you have a 30-pack-year history of smoking and currently smoke or have quit within the past 15 years.  Fecal occult blood test (FOBT) of the stool. You may have this test every year starting at age 29.  Flexible sigmoidoscopy or colonoscopy. You may have a sigmoidoscopy every 5 years or a colonoscopy every 10 years starting at age 58.  Hepatitis C blood test.  Hepatitis B blood test.  Sexually transmitted disease (STD) testing.  Diabetes screening. This is done by checking your blood sugar (glucose) after you have not eaten for a while (fasting). You may have this done every 1-3 years.  Bone density scan. This is done to screen for osteoporosis. You may have this done starting at age 78.  Mammogram. This may be done every 1-2 years. Talk to your health care provider about how often you should have regular mammograms. Talk with your health care provider about your test results,  treatment options, and if necessary, the need for more tests. Vaccines  Your health care provider may recommend certain vaccines, such as:  Influenza vaccine. This is recommended every year.  Tetanus, diphtheria, and acellular pertussis  (Tdap, Td) vaccine. You may need a Td booster every 10 years.  Zoster vaccine. You may need this after age 18.  Pneumococcal 13-valent conjugate (PCV13) vaccine. One dose is recommended after age 15.  Pneumococcal polysaccharide (PPSV23) vaccine. One dose is recommended after age 58. Talk to your health care provider about which screenings and vaccines you need and how often you need them. This information is not intended to replace advice given to you by your health care provider. Make sure you discuss any questions you have with your health care provider. Document Released: 11/20/2015 Document Revised: 07/13/2016 Document Reviewed: 08/25/2015 Elsevier Interactive Patient Education  2017 [place] Prevention in the Home Falls can cause injuries. They can happen to people of all ages. There are many things you can do to make your home safe and to help prevent falls. What can I do on the outside of my home?  Regularly fix the edges of walkways and driveways and fix any cracks.  Remove anything that might make you trip as you walk through a door, such as a raised step or threshold.  Trim any bushes or trees on the path to your home.  Use bright outdoor lighting.  Clear any walking paths of anything that might make someone trip, such as rocks or tools.  Regularly check to see if handrails are loose or broken. Make sure that both sides of any steps have handrails.  Any raised decks and porches should have guardrails on the edges.  Have any leaves, snow, or ice cleared regularly.  Use sand or salt on walking paths during winter.  Clean up any spills in your garage right away. This includes oil or grease spills. What can I do in the bathroom?  Use night lights.  Install grab bars by the toilet and in the tub and shower. Do not use towel bars as grab bars.  Use non-skid mats or decals in the tub or shower.  If you need to sit down in the shower, use a plastic, non-slip  stool.  Keep the floor dry. Clean up any water that spills on the floor as soon as it happens.  Remove soap buildup in the tub or shower regularly.  Attach bath mats securely with double-sided non-slip rug tape.  Do not have throw rugs and other things on the floor that can make you trip. What can I do in the bedroom?  Use night lights.  Make sure that you have a light by your bed that is easy to reach.  Do not use any sheets or blankets that are too big for your bed. They should not hang down onto the floor.  Have a firm chair that has side arms. You can use this for support while you get dressed.  Do not have throw rugs and other things on the floor that can make you trip. What can I do in the kitchen?  Clean up any spills right away.  Avoid walking on wet floors.  Keep items that you use a lot in easy-to-reach places.  If you need to reach something above you, use a strong step stool that has a grab bar.  Keep electrical cords out of the way.  Do not use floor polish or wax that makes floors  slippery. If you must use wax, use non-skid floor wax.  Do not have throw rugs and other things on the floor that can make you trip. What can I do with my stairs?  Do not leave any items on the stairs.  Make sure that there are handrails on both sides of the stairs and use them. Fix handrails that are broken or loose. Make sure that handrails are as long as the stairways.  Check any carpeting to make sure that it is firmly attached to the stairs. Fix any carpet that is loose or worn.  Avoid having throw rugs at the top or bottom of the stairs. If you do have throw rugs, attach them to the floor with carpet tape.  Make sure that you have a light switch at the top of the stairs and the bottom of the stairs. If you do not have them, ask someone to add them for you. What else can I do to help prevent falls?  Wear shoes that:  Do not have high heels.  Have rubber bottoms.  Are  comfortable and fit you well.  Are closed at the toe. Do not wear sandals.  If you use a stepladder:  Make sure that it is fully opened. Do not climb a closed stepladder.  Make sure that both sides of the stepladder are locked into place.  Ask someone to hold it for you, if possible.  Clearly mark and make sure that you can see:  Any grab bars or handrails.  First and last steps.  Where the edge of each step is.  Use tools that help you move around (mobility aids) if they are needed. These include:  Canes.  Walkers.  Scooters.  Crutches.  Turn on the lights when you go into a dark area. Replace any light bulbs as soon as they burn out.  Set up your furniture so you have a clear path. Avoid moving your furniture around.  If any of your floors are uneven, fix them.  If there are any pets around you, be aware of where they are.  Review your medicines with your doctor. Some medicines can make you feel dizzy. This can increase your chance of falling. Ask your doctor what other things that you can do to help prevent falls. This information is not intended to replace advice given to you by your health care provider. Make sure you discuss any questions you have with your health care provider. Document Released: 08/20/2009 Document Revised: 03/31/2016 Document Reviewed: 11/28/2014 Elsevier Interactive Patient Education  2017 [employer].

## 2020-06-17 NOTE — Addendum Note (Signed)
Addended by: Willette Brace on: 06/17/2020 02:24 PM   Modules accepted: Orders, SmartSet
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
## 2020-06-25 ENCOUNTER — Ambulatory Visit: Payer: Medicare Other | Admitting: Neurology

## 2020-06-25 ENCOUNTER — Other Ambulatory Visit: Payer: Self-pay | Admitting: Family Medicine

## 2020-06-25 DIAGNOSIS — E2839 Other primary ovarian failure: Secondary | ICD-10-CM

## 2020-07-03 ENCOUNTER — Telehealth: Payer: Self-pay | Admitting: Family Medicine

## 2020-07-03 NOTE — Progress Notes (Signed)
°  Chronic Care Management   Outreach Note  07/03/2020 Name: KARNE OZGA MRN: [id number] DOB: [birth year]  Referred by: [name], [name], MD Reason for referral : No chief complaint on file.   An unsuccessful telephone outreach was attempted today. The patient was referred to the pharmacist for assistance with care management and care coordination.   Follow Up Plan:   Carley Perdue [employer] Scheduler

## 2020-07-06 ENCOUNTER — Telehealth: Payer: Self-pay | Admitting: Neurology

## 2020-07-06 ENCOUNTER — Encounter: Payer: Self-pay | Admitting: Neurology

## 2020-07-06 ENCOUNTER — Other Ambulatory Visit: Payer: Self-pay

## 2020-07-06 ENCOUNTER — Ambulatory Visit (INDEPENDENT_AMBULATORY_CARE_PROVIDER_SITE_OTHER): Payer: Medicare Other | Admitting: Neurology

## 2020-07-06 VITALS — BP 133/72 | HR 64 | Ht 69.0 in | Wt 219.8 lb

## 2020-07-06 DIAGNOSIS — Z8669 Personal history of other diseases of the nervous system and sense organs: Secondary | ICD-10-CM | POA: Diagnosis not present

## 2020-07-06 DIAGNOSIS — G4489 Other headache syndrome: Secondary | ICD-10-CM | POA: Diagnosis not present

## 2020-07-06 DIAGNOSIS — R251 Tremor, unspecified: Secondary | ICD-10-CM

## 2020-07-06 DIAGNOSIS — H905 Unspecified sensorineural hearing loss: Secondary | ICD-10-CM

## 2020-07-06 DIAGNOSIS — H9190 Unspecified hearing loss, unspecified ear: Secondary | ICD-10-CM | POA: Insufficient documentation

## 2020-07-06 MED ORDER — LAMOTRIGINE 25 MG PO TABS: 25.0000 mg | ORAL_TABLET | Freq: Two times a day (BID) | ORAL | 3 refills | Status: DC

## 2020-07-06 NOTE — Telephone Encounter (Signed)
[facility]/[place] [facility] [place] order sent to [facility]. No auth they will reach out to the patient to schedule.

## 2020-07-06 NOTE — Patient Instructions (Signed)
I will order MRI of the brain  Continue Lamictal at current dosing Please see you primary doctor See you back in 4 months

## 2020-07-06 NOTE — Progress Notes (Signed)
I have read the note, and I agree with the clinical assessment and plan.  Iyanna Drummer K Cai Flott   
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
## 2020-07-07 DIAGNOSIS — G894 Chronic pain syndrome: Secondary | ICD-10-CM | POA: Diagnosis not present

## 2020-07-07 DIAGNOSIS — G8929 Other chronic pain: Secondary | ICD-10-CM | POA: Diagnosis not present

## 2020-07-07 DIAGNOSIS — M545 Low back pain: Secondary | ICD-10-CM | POA: Diagnosis not present

## 2020-07-07 DIAGNOSIS — Z79899 Other long term (current) drug therapy: Secondary | ICD-10-CM | POA: Diagnosis not present

## 2020-07-08 DIAGNOSIS — R2689 Other abnormalities of gait and mobility: Secondary | ICD-10-CM | POA: Diagnosis not present

## 2020-07-08 DIAGNOSIS — M797 Fibromyalgia: Secondary | ICD-10-CM | POA: Diagnosis not present

## 2020-07-08 DIAGNOSIS — M6281 Muscle weakness (generalized): Secondary | ICD-10-CM | POA: Diagnosis not present

## 2020-07-08 NOTE — Telephone Encounter (Signed)
Patient called me and informed me that Debbie [name] told her to come to our mobile unit. I informed the patient that we do not take her insurance [place]ual of [place] for our mobile unit we are not contract with them for the MRI.. she states she wanted to go some where that would be less noise. I informed her I would send the order to [facility] because that is an open mri and that should help her get through the MRI.

## 2020-07-08 NOTE — Telephone Encounter (Signed)
Scheduled for 07/17/20.

## 2020-07-10 ENCOUNTER — Telehealth: Payer: Self-pay | Admitting: Family Medicine

## 2020-07-10 NOTE — Progress Notes (Signed)
  Chronic Care Management   Note  07/10/2020 Name: Debbie [name] MRN: [id number] DOB: [birth year]  Debbie [name] is a 74 y.o. year old female who is a primary care patient of [name], Malka So, MD. I reached out to Brock Ra by phone today in response to a referral sent by [name]'s PCP, [name], [name], MD.   [name] was given information about Chronic Care Management services today including:  1. CCM service includes personalized support from designated clinical staff supervised by her physician, including individualized plan of care and coordination with other care providers 2. 24/7 contact phone numbers for assistance for urgent and routine care needs. 3. Service will only be billed when office clinical staff spend 20 minutes or more in a month to coordinate care. 4. Only one practitioner may furnish and bill the service in a calendar month. 5. The patient may stop CCM services at any time (effective at the end of the month) by phone call to the office staff.   Patient agreed to services and verbal consent obtained.   Follow up plan:   Carley Perdue [employer] Scheduler

## 2020-07-15 DIAGNOSIS — M797 Fibromyalgia: Secondary | ICD-10-CM | POA: Diagnosis not present

## 2020-07-15 DIAGNOSIS — R2689 Other abnormalities of gait and mobility: Secondary | ICD-10-CM | POA: Diagnosis not present

## 2020-07-15 DIAGNOSIS — M6281 Muscle weakness (generalized): Secondary | ICD-10-CM | POA: Diagnosis not present

## 2020-07-17 DIAGNOSIS — Z853 Personal history of malignant neoplasm of breast: Secondary | ICD-10-CM | POA: Diagnosis not present

## 2020-07-17 DIAGNOSIS — R413 Other amnesia: Secondary | ICD-10-CM | POA: Diagnosis not present

## 2020-07-17 DIAGNOSIS — R519 Headache, unspecified: Secondary | ICD-10-CM | POA: Diagnosis not present

## 2020-07-17 DIAGNOSIS — R42 Dizziness and giddiness: Secondary | ICD-10-CM | POA: Diagnosis not present

## 2020-07-20 ENCOUNTER — Telehealth: Payer: Self-pay | Admitting: Neurology

## 2020-07-20 DIAGNOSIS — Z8669 Personal history of other diseases of the nervous system and sense organs: Secondary | ICD-10-CM

## 2020-07-20 NOTE — Telephone Encounter (Signed)
I received MRI of the brain report completed on July 17, 2020, area in the right occipital lobe, is nonspecific, given history of breast cancer recommend obtaining MRI with contrast, surgical changes of the right parietal craniotomy with some underlying encephalomalacia, recommend special attention to this area on postcontrast imaging.  Has she had recent labs with creatinine within the last few weeks, if not she can come by our office, to determine if MRI with contrast can be performed.  1. No acute intercranial abnormality  2.  Small area of T2 flair hyperintensity in the subcortical region at the right occipital lobe.  This is nonspecific, may represent sequelae of old ischemic change versus other nonspecific insult, however given the patient's history of breast cancer, there are no contraindications recommend obtaining a postcontrast MRI to ensure no enhancing lesion at this site.  3.  There are surgical changes of prior right parietal craniotomy with some underlying encephalomalacia/adjacent T2 flair hyperintensity.  Recommend correlation with outside brain imaging to evaluate stability of the T2 flair hyperintensity.  Attention to this region on postcontrast imaging.  4.  Mild degree of nonspecific white matter disease within the cerebral hemispheres, this finding can be seen in patients with vascular risk factors and may reflect the sequelae of chronic small vessel ischemic change  5.  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

## 2020-07-22 DIAGNOSIS — R2689 Other abnormalities of gait and mobility: Secondary | ICD-10-CM | POA: Diagnosis not present

## 2020-07-22 DIAGNOSIS — M6281 Muscle weakness (generalized): Secondary | ICD-10-CM | POA: Diagnosis not present

## 2020-07-22 DIAGNOSIS — M797 Fibromyalgia: Secondary | ICD-10-CM | POA: Diagnosis not present

## 2020-07-23 NOTE — Telephone Encounter (Signed)
I called the patient.  I explained the reasons for the MRI.  She is amenable to doing this, she is somewhat hesitant because in the past they have had a lot of trouble getting a vein for contrast.  She will come in and get the blood work done, if the renal function is adequate, the MRI can be scheduled.

## 2020-07-23 NOTE — Telephone Encounter (Signed)
Have her come in for lab draw, check kidney function. Just BMP, orders in. In December 2020 creatinine was 0.74. If kidney function is okay will send for contrast MRI reviewed with Dr. Jannifer Franklin.

## 2020-07-23 NOTE — Telephone Encounter (Signed)
Spoke to pt , gave her results of MRI (recommending MRI w contrast to evaluate nonspecific area R occipital area.  She stated that had previous MRI HPRegional and was stuck 9 times to finally get access for contrast.  She does not want to go thru if does not have to.  I relayed that MRI more specific want to do with contrast due to hx of Breast CA and she relayed understanding but still hesitant.  I relayed that hydration big part and that would pump up vessels.  She stated she is on blood thinner as well.  Was there any other way.  I relayed that MRI best to evaluate what we are looking for.  She asked for a call from you H# listed.  She has not had blood work recently, and has stage 3 KD.

## 2020-07-23 NOTE — Addendum Note (Signed)
Addended by: Suzzanne Cloud on: 07/23/2020 10:33 AM   Modules accepted: Orders

## 2020-07-24 ENCOUNTER — Ambulatory Visit (INDEPENDENT_AMBULATORY_CARE_PROVIDER_SITE_OTHER): Payer: Medicare Other | Admitting: *Deleted

## 2020-07-24 ENCOUNTER — Other Ambulatory Visit: Payer: Self-pay

## 2020-07-24 DIAGNOSIS — Z86711 Personal history of pulmonary embolism: Secondary | ICD-10-CM | POA: Diagnosis not present

## 2020-07-24 DIAGNOSIS — Z5181 Encounter for therapeutic drug level monitoring: Secondary | ICD-10-CM | POA: Diagnosis not present

## 2020-07-24 DIAGNOSIS — Z7901 Long term (current) use of anticoagulants: Secondary | ICD-10-CM

## 2020-07-24 LAB — POCT INR: INR: 2.1 (ref 2.0–3.0)

## 2020-07-24 NOTE — Patient Instructions (Signed)
Description   Continue taking 1 tablet daily. Recheck INR in 6 weeks. Call [facility] [phone] if you have any changes in medications.      

## 2020-07-27 NOTE — Telephone Encounter (Signed)
Debbie [name], are you ok with me faxing the lab orders to [facility] for the patient? I will put our fax number on the order so we can receive the results.

## 2020-07-27 NOTE — Telephone Encounter (Signed)
Pt is asking for a lab request to be faxed to [facility] fax [phone] .  Pt asking to be called once this has been done

## 2020-07-28 ENCOUNTER — Telehealth: Payer: Self-pay | Admitting: Neurology

## 2020-07-28 NOTE — Telephone Encounter (Signed)
Spoke with patient. She is aware Debbie [name] is ok with this. Will fax the requisition form to [facility]. Nothing further needed at time of call.

## 2020-07-28 NOTE — Telephone Encounter (Signed)
Pt called wanting to know if her lab order for her Metabolic panel can be faxed to [facility]

## 2020-07-29 ENCOUNTER — Other Ambulatory Visit: Payer: Self-pay | Admitting: Neurology

## 2020-07-29 DIAGNOSIS — Z8669 Personal history of other diseases of the nervous system and sense organs: Secondary | ICD-10-CM | POA: Diagnosis not present

## 2020-07-29 NOTE — Telephone Encounter (Signed)
Order faxed to [facility] per pt request with fax confirmation received.  Relayed to pt via home #.

## 2020-07-30 LAB — BASIC METABOLIC PANEL
BUN/Creatinine Ratio: 15 (ref 12–28)
BUN: 12 mg/dL (ref 8–27)
CO2: 26 mmol/L (ref 20–29)
Calcium: 9.3 mg/dL (ref 8.7–10.3)
Chloride: 104 mmol/L (ref 96–106)
Creatinine, Ser: 0.82 mg/dL (ref 0.57–1.00)
GFR calc Af Amer: 82 mL/min/{1.73_m2} (ref >59)
GFR calc non Af Amer: 71 mL/min/{1.73_m2} (ref >59)
Glucose: 94 mg/dL (ref 65–99)
Potassium: 4.5 mmol/L (ref 3.5–5.2)
Sodium: 143 mmol/L (ref 134–144)

## 2020-08-04 ENCOUNTER — Telehealth: Payer: Self-pay | Admitting: Neurology

## 2020-08-04 DIAGNOSIS — R42 Dizziness and giddiness: Secondary | ICD-10-CM

## 2020-08-04 DIAGNOSIS — H905 Unspecified sensorineural hearing loss: Secondary | ICD-10-CM

## 2020-08-04 NOTE — Telephone Encounter (Signed)
Pt calling for results. Inform Pt nurse will call you when physician gets your results.

## 2020-08-06 NOTE — Telephone Encounter (Signed)
Pt called wanting a nurse to call her with her lab results. Please advise.

## 2020-08-06 NOTE — Telephone Encounter (Signed)
Creatinine looks good 0.82, okay for MRI with contrast, order placed, with and without.

## 2020-08-06 NOTE — Addendum Note (Signed)
Addended by: Suzzanne Cloud on: 08/06/2020 04:55 PM   Modules accepted: Orders

## 2020-08-06 NOTE — Telephone Encounter (Signed)
Spoke with patient about results. She verbalized understanding of results. She is aware the MRI order has been placed.   Nothing further needed at time of call.

## 2020-08-06 NOTE — Telephone Encounter (Signed)
Received lab report from [facility]. Patient is aware that we have received them. Will give lab results to Judson Roch to review.

## 2020-08-06 NOTE — Telephone Encounter (Signed)
Pt has called asking for the results to her scan.  Please call once results are available.

## 2020-08-06 NOTE — Telephone Encounter (Signed)
Advised patient that we have not received lab results from [facility]. She will reach out to [facility] with a different number. Will continue to await results.

## 2020-08-07 DIAGNOSIS — G894 Chronic pain syndrome: Secondary | ICD-10-CM | POA: Diagnosis not present

## 2020-08-07 DIAGNOSIS — Z79899 Other long term (current) drug therapy: Secondary | ICD-10-CM | POA: Diagnosis not present

## 2020-08-07 DIAGNOSIS — M545 Low back pain, unspecified: Secondary | ICD-10-CM | POA: Diagnosis not present

## 2020-08-07 DIAGNOSIS — G8929 Other chronic pain: Secondary | ICD-10-CM | POA: Diagnosis not present

## 2020-08-10 ENCOUNTER — Other Ambulatory Visit: Payer: Self-pay | Admitting: Neurology

## 2020-08-10 ENCOUNTER — Telehealth: Payer: Self-pay | Admitting: Neurology

## 2020-08-10 DIAGNOSIS — R42 Dizziness and giddiness: Secondary | ICD-10-CM

## 2020-08-10 MED ORDER — ALPRAZOLAM 0.5 MG PO TABS: ORAL_TABLET | ORAL | 0 refills | Status: DC

## 2020-08-10 NOTE — Telephone Encounter (Signed)
I sent xanax 0.5 mg, 1 tablet 30 minutes before MRI, may repeat x 1 if needed

## 2020-08-10 NOTE — Telephone Encounter (Signed)
Noted, faxed order to [facility] they will reach out to the patient to schedule.

## 2020-08-10 NOTE — Addendum Note (Signed)
Addended by: Suzzanne Cloud on: 08/10/2020 12:36 PM   Modules accepted: Orders

## 2020-08-10 NOTE — Telephone Encounter (Signed)
medic[place]ual of [place] faxed order to [facility] they will reach out to the patient to schedule.

## 2020-08-10 NOTE — Telephone Encounter (Signed)
Per Larene Beach at [facility]   Does [name] want the mri head just with contrast since she has already had the w/o part?  Or does she want w/wo again.

## 2020-08-10 NOTE — Telephone Encounter (Signed)
Pt is scheduled 10/7  1030 am,  pt states she needs medication for this exam to calm her down.

## 2020-08-13 DIAGNOSIS — R42 Dizziness and giddiness: Secondary | ICD-10-CM | POA: Diagnosis not present

## 2020-08-13 DIAGNOSIS — H905 Unspecified sensorineural hearing loss: Secondary | ICD-10-CM | POA: Diagnosis not present

## 2020-08-18 ENCOUNTER — Telehealth: Payer: Self-pay | Admitting: Neurology

## 2020-08-18 DIAGNOSIS — R42 Dizziness and giddiness: Secondary | ICD-10-CM

## 2020-08-18 DIAGNOSIS — G4489 Other headache syndrome: Secondary | ICD-10-CM

## 2020-08-18 NOTE — Telephone Encounter (Signed)
I called the patient, we reviewed MRI, showing no evidence of acute intercranial abnormality.  She reports she failed to mention during her last visit, persistent right-sided headache at her temple, different from her typical headache, which is to the top of the head.  She has had some blurry vision, double vision, not focusing as well.  I reviewed with Dr. Jannifer Franklin, given report of right-sided headache, will have her come in for ESR, CRP, I will place the orders. Please let her know.

## 2020-08-18 NOTE — Telephone Encounter (Signed)
Pt called wanting to know her MRI results. Please advise.

## 2020-08-18 NOTE — Telephone Encounter (Signed)
I called pt and relayed that her MRI brain results showed not acute intracranial abnormalities.  She is asking about more specifics in r occipital area. Still with r ear pain temple behind ear and dizziness. Please call her.

## 2020-08-18 NOTE — Telephone Encounter (Signed)
Received report of MRI of the brain with contrast from [facility] on 08/13/2020, no evidence of acute intercranial abnormality.  FINDINGS: No significant interval change.   Old right parietal craniotomy and underlying focus of encephalomalacia, as before.  Old small focus of encephalomalacia in the posterior, inferior right occipital lobe, as before.  Persistent right trigeminal artery (anatomic variant).   No intracranial mass or mass effect.  No hydrocephalus.  No abnormal brain parenchymal or leptomeningeal enhancement.  No evidence of an acoustic schwannoma.  Impression Performed by [id number] IMPRESSION: No evidence of acute intracranial abnormality.

## 2020-08-18 NOTE — Addendum Note (Signed)
Addended by: Suzzanne Cloud on: 08/18/2020 05:15 PM   Modules accepted: Orders

## 2020-08-19 NOTE — Telephone Encounter (Signed)
I spoke to pt this am, I relayed the request for labs ESR/CRP for inflammation after SS/NP spoke to Dr. Jannifer Franklin.  Pt verbalized understanding.   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  I relayed that I did not know this but would make mention, and once lab results back could address.

## 2020-08-31 ENCOUNTER — Other Ambulatory Visit: Payer: Self-pay | Admitting: Cardiology

## 2020-09-01 ENCOUNTER — Other Ambulatory Visit: Payer: Self-pay

## 2020-09-01 ENCOUNTER — Telehealth: Payer: Self-pay | Admitting: Pharmacist

## 2020-09-01 ENCOUNTER — Ambulatory Visit (INDEPENDENT_AMBULATORY_CARE_PROVIDER_SITE_OTHER): Payer: Medicare Other | Admitting: Cardiology

## 2020-09-01 ENCOUNTER — Ambulatory Visit (INDEPENDENT_AMBULATORY_CARE_PROVIDER_SITE_OTHER): Payer: Medicare Other | Admitting: Pharmacist

## 2020-09-01 VITALS — BP 122/72 | HR 64 | Ht 70.0 in | Wt 223.0 lb

## 2020-09-01 DIAGNOSIS — R5383 Other fatigue: Secondary | ICD-10-CM

## 2020-09-01 DIAGNOSIS — I480 Paroxysmal atrial fibrillation: Secondary | ICD-10-CM | POA: Diagnosis not present

## 2020-09-01 DIAGNOSIS — M797 Fibromyalgia: Secondary | ICD-10-CM | POA: Diagnosis not present

## 2020-09-01 DIAGNOSIS — Z7901 Long term (current) use of anticoagulants: Secondary | ICD-10-CM

## 2020-09-01 DIAGNOSIS — R0602 Shortness of breath: Secondary | ICD-10-CM

## 2020-09-01 DIAGNOSIS — I2782 Chronic pulmonary embolism: Secondary | ICD-10-CM

## 2020-09-01 DIAGNOSIS — R001 Bradycardia, unspecified: Secondary | ICD-10-CM

## 2020-09-01 DIAGNOSIS — Z5181 Encounter for therapeutic drug level monitoring: Secondary | ICD-10-CM

## 2020-09-01 DIAGNOSIS — I10 Essential (primary) hypertension: Secondary | ICD-10-CM

## 2020-09-01 LAB — POCT INR: INR: 2.3 (ref 2.0–3.0)

## 2020-09-01 MED ORDER — DILTIAZEM HCL 30 MG PO TABS: 30.0000 mg | ORAL_TABLET | Freq: Four times a day (QID) | ORAL | Status: DC | PRN

## 2020-09-01 MED ORDER — DILTIAZEM HCL ER COATED BEADS 240 MG PO CP24: 240.0000 mg | ORAL_CAPSULE | Freq: Every day | ORAL | 2 refills | Status: DC

## 2020-09-01 NOTE — Chronic Care Management (AMB) (Signed)
Chronic Care Management Pharmacy Assistant   Name: Debbie [name]  MRN: [id number] DOB: 1946/01/16  Reason for Encounter: Medication Review/Initial Questions for Pharmacist visit on 09-02-2020   Patient Questions: 1. Have you seen any other providers since your last visit? Yes Buford Dresser, MD Cardiology and Suzzanne Cloud, NP Neurology 2. Any changes in your medications or health? Yes, her Diltiazem was decreased to 240 mg 3. Any side effects from any medications? No 4. Do you have any symptoms or problems not managed by your medications? No 5. Any concerns about your health right now? Yes, she has some concerns about her energy level. 6. Has your provider asked that you check blood pressure, blood sugar, or follow a special diet at home? Daily 7. Do you get any type of exercise regularly? No, because she has been feeling weak. 8. Can you think of a goal you would like to reach for your health? Yes, she would like to be able to go back to water therapy twice a week and do her housework. 9. Do you have any problems getting your medications? No 10. Is there anything that you would like to discuss during the appointment? No  She was informed to have her medications and supplements near during her telephone appointments and have blood pressure and blood sugar logs available.   PCP : [name], [name], MD  Allergies:   Allergies  Allergen Reactions  . Diclofenac Shortness Of Breath  . Atorvastatin Other (See Comments)    Muscle pain  . Tape Other (See Comments)    Peels skin off (bandaids, too) unknown  . Albuterol Other (See Comments)    Afib  . Lasix [Furosemide]     Dizzy nausea  . Mushroom Extract Complex Nausea And Vomiting  . Tramadol Other (See Comments) and Nausea And Vomiting  . Buprenorphine Hcl Nausea And Vomiting  . Codeine Nausea And Vomiting  . Fish Oil Nausea And Vomiting  . Morphine And Related Nausea And Vomiting    Medications: Outpatient  Encounter Medications as of 09/01/2020  Medication Sig Note  . acetaminophen (TYLENOL) 325 MG tablet Take 2 tablets (650 mg total) by mouth every 6 (six) hours. 06/17/2020: As need for pain  . ALPRAZolam (XANAX) 0.5 MG tablet Take 1 tablet 30 minutes before MRI, may repeat x 1 if needed   . Ascorbic Acid (VITAMIN C PO) Take by mouth.   . calcium carbonate (OS-CAL) 600 MG TABS tablet Take 600 mg by mouth daily with breakfast.   . diltiazem (CARDIZEM CD) 240 MG 24 hr capsule Take 1 capsule (240 mg total) by mouth daily.   Marland Kitchen diltiazem (CARDIZEM) 30 MG tablet Take 1 tablet (30 mg total) by mouth every 6 (six) hours as needed (Heart rate greater than 100).   . gabapentin (NEURONTIN) 600 MG tablet Take 1 tablet (600 mg total) by mouth 3 (three) times daily. 06/17/2020: Taking 2 a day, pt states its helped swelling  . HYDROcodone-acetaminophen (NORCO/VICODIN) 5-325 MG tablet Take 1 tablet by mouth 3 (three) times daily as needed for moderate pain.  06/17/2020: Pt taking 2 a day  . lamoTRIgine (LAMICTAL) 25 MG tablet Take 1 tablet (25 mg total) by mouth 2 (two) times daily.   Marland Kitchen LEVALBUTEROL HCL IN Inhale into the lungs as needed.    . Multiple Vitamins-Minerals (ZINC PO) Take by mouth.   . polyethylene glycol (MIRALAX / GLYCOLAX) packet Take 17 g by mouth as needed.   . Vitamin D,  Ergocalciferol, (DRISDOL) 1.25 MG (50000 UT) CAPS capsule TAKE 1 CAPSULE BY MOUTH EVERY 10 DAYS   . warfarin (COUMADIN) 5 MG tablet TAKE 1/2 TO 1 TABLET BY MOUTH DAILY AS DIRECTED    No facility-administered encounter medications on file as of 09/01/2020.    Current Diagnosis: Patient Active Problem List   Diagnosis Date Noted  . Hearing loss 07/06/2020  . Atrial fibrillation with RVR ([place]) 03/04/2019  . Elevated troponin 03/04/2019  . History of partial seizures 03/04/2019  . Vitamin D deficiency, unspecified 09/12/2018  . Sacral fracture, closed ([place]) 07/11/2018  . Fall   . Pain   . Palpitations 04/20/2018  .  Encounter for therapeutic drug monitoring 10/24/2017  . History of pulmonary embolism 10/20/2017  . Chronic lower back pain 09/08/2017  . Arthritis 06/19/2017  . Chest tightness or pressure 06/19/2017  . Claustrophobia 06/19/2017  . Diverticulitis 06/19/2017  . Headache 06/19/2017  . Heartburn 06/19/2017  . PONV (postoperative nausea and vomiting) 06/19/2017  . Nausea without vomiting 05/08/2017  . Generalized anxiety disorder 03/07/2017  . Varicose veins of both lower extremities 02/11/2017  . Atypical chest pain 02/11/2017  . Constipation 01/06/2017  . Dysphagia   . Restrictive lung disease 12/16/2016  . Generalized osteoarthritis of multiple sites 12/09/2016  . Dyspnea 11/18/2016  . Chronic pulmonary embolism ([place]) 09/06/2016  . Other pulmonary embolism without acute cor pulmonale ([place]) 09/05/2016  . Pericardial effusion 08/29/2016  . S/P ablation of atrial fibrillation 04/13/2016  . Chronic arthralgias of knees and hips 02/26/2016  . Thoracic radiculopathy 02/26/2016  . Anxiety 01/29/2016  . Depression 01/29/2016  . Esophageal reflux 01/29/2016  . Osteoarthritis 01/29/2016  . Osteopenia 01/29/2016  . Other intervertebral disc displacement, lumbar region 01/29/2016  . Tremor 01/29/2016  . 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 Spondylolisthesis of lumbar region 01/29/2016  . Seizure, partial ([place]) 01/29/2016  . Pure hypertriglyceridemia 01/29/2016  . Osteoarthritis of right foot 01/18/2016  . Fibromyalgia 11/13/2015  . Anticoagulant long-term use 10/28/2015  . Benign hypertension with CKD (chronic kidney disease), stage II 10/28/2015  . Mixed hyperlipidemia 10/28/2015  . Need for influenza vaccination 10/28/2015  . Recurrent major depressive disorder, in partial remission ([place]) 10/28/2015  . Chronic kidney disease, stage 3 ([place]) 07/29/2015  . Essential hypertension 05/12/2015  . Fatigue 05/12/2015  . Long term current use of  anticoagulant 05/12/2015  . Common migraine 05/07/2015  . Sprain of neck 06/26/2013  . Sleep apnea 02/04/2013  . Paroxysmal atrial fibrillation ([place]) 02/04/2013  . Other vitamin B12 deficiency anemia 02/04/2013  . Memory loss 02/04/2013  . Chronic back pain 02/04/2013  . History of seizure disorder 11/08/2011  . Cancer ([place]) 11/07/1982    Goals Addressed   None     Follow-Up:  Pharmacist Review   [name], [place] Assistant [phone]

## 2020-09-01 NOTE — Progress Notes (Signed)
Cardiology Office Note:    Date:  09/01/2020   ID:  Debbie [name], DOB 10-Jun-1946, MRN [id number]  PCP:  Debbie, [name], MD  Cardiologist:  Debbie Dresser, MD PhD (previously Dr. Saunders [name])  Referring MD: Debbie, [name], MD   CC: follow up  History of Present Illness:    Debbie [name] is a 74 y.o. female with a hx of persistent atrial fibrillation s/p ablation--now paroxysmal, pulmonary embolism (2017), hyperlipidemia, chronic kidney disease stage III, obesity, sleep apnea not on CPAP, fibromyalgia, headaches who is seen in follow up.   She was previously seen by Dr. Saunders [name] on 05/28/2018. At that visit, the plan was to continue anticoagulation, continue diltiazem (both standing and as needed). Recent monitor did not show sustained arrhythmias. She declined treatment for her OSA. I first met her on 09/03/18. Noted that she is very symptomatic with her atrial fibrillation.  Today: Feels fatigued. Brings a log of her home BP and HR. She is currently alternating diltiazem 360 mg one day and 240 mg the following day. Blood pressures have been well controlled in the 120s/70s largely, but heart rates have been between 48-60 bpm. No recent atrial fibrillation. No syncope, feels "wobbly" all the time.   Diffuse ROS positive. Having right thumb pain, limb pain, wrist pain. Back pain is "unbelievable." Struggling with a lot of pain overall, feels like nothing is helping. Seeing pain management. Endorses fatigue, shortness of breath, diffuse pain. Intermittent chest tightness, nonexertional. No syncope or palpitations.  Past Medical History:  Diagnosis Date  . Arthritis   . Atrial fibrillation ([place])   . Benign essential tremor   . Breast cancer ([place])   . Cataract    Bilateral 2018  . Chronic renal insufficiency   . Common migraine 05/07/2015  . Complication of anesthesia   . Depression with anxiety   . Drowsiness    Excessive daytime  . Dyslipidemia   . Fibromyalgia   . History of  partial seizures   . Hypertension   . Memory disturbance   . Muscle tension headache   . Obesity   . Obstructive sleep apnea   . PONV (postoperative nausea and vomiting)   . S/P epidural steroid injection   . Sacral contusion 07/2018  . Seizures ([facility])    Partial  . Sleep apnea    does not wear CPAP  . Sprain of neck 06/26/2013  . Tremors of nervous system   . Vitamin B12 deficiency     Past Surgical History:  Procedure Laterality Date  . ABDOMINAL HYSTERECTOMY    . ablation procedure     Cardiac, for Afib  . BASAL CELL CARCINOMA EXCISION    . BASAL CELL CARCINOMA EXCISION    . BRAIN MENINGIOMA EXCISION Right   . BREAST RECONSTRUCTION Bilateral   . breast reconstructive    . CATARACT EXTRACTION, BILATERAL    . CHOLECYSTECTOMY    . DILATION AND CURETTAGE OF UTERUS    . dilution    . ESOPHAGEAL MANOMETRY N/A 12/14/2016   Procedure: ESOPHAGEAL MANOMETRY (EM);  Surgeon: Mauri Pole, MD;  Location: [facility];  Service: Endoscopy;  Laterality: N/A;  . gallbladder resection    . LUMBAR SPINE SURGERY N/A   . lumbosacral    . mastectomies Bilateral   . NOSE SURGERY     Basal cell carcinoma resection from the nose  . TUBAL LIGATION N/A     Current Medications: Current Outpatient Medications on File Prior to Visit  Medication  Sig  . acetaminophen (TYLENOL) 325 MG tablet Take 2 tablets (650 mg total) by mouth every 6 (six) hours.  . ALPRAZolam (XANAX) 0.5 MG tablet Take 1 tablet 30 minutes before MRI, may repeat x 1 if needed  . Ascorbic Acid (VITAMIN C PO) Take by mouth.  . calcium carbonate (OS-CAL) 600 MG TABS tablet Take 600 mg by mouth daily with breakfast.  . diltiazem (CARDIZEM CD) 240 MG 24 hr capsule TAKE 1 CAPSULE (240 MG TOTAL) BY MOUTH EVERY OTHER DAY. ROTATING WITH DILTIAZEM 360  . diltiazem (TIAZAC) 360 MG 24 hr capsule Take 1 capsule (360 mg total) by mouth every other day. ALTERNATING WITH DILTIAZEM 240 MG.  . gabapentin (NEURONTIN) 600 MG tablet Take 1  tablet (600 mg total) by mouth 3 (three) times daily.  Marland Kitchen HYDROcodone-acetaminophen (NORCO/VICODIN) 5-325 MG tablet Take 1 tablet by mouth 3 (three) times daily as needed for moderate pain.   Marland Kitchen lamoTRIgine (LAMICTAL) 25 MG tablet Take 1 tablet (25 mg total) by mouth 2 (two) times daily.  Marland Kitchen LEVALBUTEROL HCL IN Inhale into the lungs as needed.   . Multiple Vitamins-Minerals (ZINC PO) Take by mouth.  . polyethylene glycol (MIRALAX / GLYCOLAX) packet Take 17 [name] by mouth as needed.  . Vitamin D, Ergocalciferol, (DRISDOL) 1.25 MG (50000 UT) CAPS capsule TAKE 1 CAPSULE BY MOUTH EVERY 10 DAYS  . warfarin (COUMADIN) 5 MG tablet TAKE 1/2 TO 1 TABLET BY MOUTH DAILY AS DIRECTED   No current facility-administered medications on file prior to visit.     Allergies:   Diclofenac, Atorvastatin, Tape, Albuterol, Lasix [furosemide], Mushroom extract complex, Tramadol, Buprenorphine hcl, Codeine, Fish oil, and Morphine and related   Social History   Tobacco Use  . Smoking status: Never Smoker  . Smokeless tobacco: Never Used  Vaping Use  . Vaping Use: Never used  Substance Use Topics  . Alcohol use: No  . Drug use: No    Family History: The patient's family history includes Arthritis in her father and mother; Breast cancer in her sister; Cancer in her father; Cancer - Colon in her father; Colon cancer in her father; Diabetes in her maternal grandfather and mother; Heart disease in her mother; Heart failure in her maternal grandfather and mother; Hyperlipidemia in her mother; Hypertension in her mother; Rectal cancer in her maternal uncle; Stroke in her maternal grandmother and mother. There is no history of Lung disease, Esophageal cancer, or Stomach cancer.  ROS:   Please see the history of present illness.  Additional pertinent ROS otherwise unremarkable.  EKGs/Labs/Other Studies Reviewed:    The following studies were reviewed today: Cath/PCI:  LHC(greater than 10 years ago in [place], [place]):  Normal per patient's report.  CV Surgery:  None  EP Procedures and Devices:  48-hour Holter monitor (05/03/18): Predominantly sinus rhythm with rare PACs, PVCs, and brief atrial runs lasting up to 4 beats.  No sustained arrhythmia.  30-day event monitor (12/29/16): Predominantly sinus rhythm without significant abnormalities, though quite a bit of artifact was noted.  Atrial fibrillation cryoablation (08/17/16, Dr. Minna Merritts, [facility]  Atrial fibrillation radiofrequency ablation (2014)  Non-Invasive Evaluation(s):  TTE (01/12/17): Normal LV size with mild LVH. LVEF 55-60% with grade 1 diastolic dysfunction. Moderate left atrial enlargement. Trivial pericardial effusion. Normal RV size and function. Normal CVP.  Pharmacologic myocardial perfusion stress test (12/29/16): Low-risk study with fixed mid and apical anterior/anteroseptal/septal defect most likely representing breast attenuation. Small basal inferolateral and basal anterolateral partially reversible defect that most  likely represents shifting breast attenuation, though small area of ischemia cannot be excluded. LVEF 55-65%.  Limited TTE (09/13/16): Grossly normal LV size and function. Small pericardial effusion.  Limited TTE (09/06/16): Mild to moderate pericardial effusion.  Limited TTE (08/18/16): Small pericardial effusion near RV.  EKG:  EKG is personally reviewed today.  The ekg ordered today demonstrates NSR at 64 bpm with PRWP  Recent Labs: 10/29/2019: BNP 170.5; TSH 1.580 07/29/2020: BUN 12; Creatinine, Ser 0.82; Potassium 4.5; Sodium 143  Recent Lipid Panel No results found for: CHOL, TRIG, HDL, CHOLHDL, VLDL, LDLCALC, LDLDIRECT  Physical Exam:    VS:  BP 122/72   Pulse 64   Ht '5\' 10"'  (1.778 m)   Wt 223 lb (101.2 kg)   SpO2 95%   BMI 32.00 kg/m     Wt Readings from Last 3 Encounters:  09/01/20 223 lb (101.2 kg)  07/06/20 219 lb 12.8 oz (99.7 kg)  06/17/20 214 lb 12.8 oz (97.4 kg)    GEN: Well  nourished, well developed in no acute distress HEENT: Normal, moist mucous membranes NECK: No JVD CARDIAC: regular rhythm, normal S1 and S2, no rubs or gallops. No murmur. VASCULAR: Radial and DP pulses 2+ bilaterally. No carotid bruits RESPIRATORY:  Clear to auscultation without rales, wheezing or rhonchi  ABDOMEN: Soft, non-tender, non-distended MUSCULOSKELETAL:  Ambulates independently SKIN: Warm and dry, no significant LE edema NEUROLOGIC:  Alert and oriented x 3. No focal neuro deficits noted. Diffusely tender throughout PSYCHIATRIC:  Normal affect   ASSESSMENT:    1. Paroxysmal atrial fibrillation (HCC)   2. Bradycardia, sinus   3. SOB (shortness of breath)   4. Other fatigue   5. Fibromyalgia   6. Anticoagulant long-term use   7. Essential hypertension    PLAN:    Shortness of breath, fatigue: -she reports borderline low heart rates. Will change her from alternating 360 mg/240 mg dilitazem to 240 mg daily diltiazem -call with palpitations or worsening symptoms  Fibromyalgia, diffuse pain: -main concern today -working with her pain management team  Paroxysmal atrial fibrillation, in sinus rhythm, on long term anticoagulation: -she is very symptomatic with her atrial fibrillation. Feels she has been in NSR recently.  -declines CPAP for OSA, cannot tolerate -regular, rate controlled today on exam -CHA2DS2/VAS Stroke Risk Points =5, currently on coumadin. Have discussed DOAC in the past, declines  Hypertension: well controlled -diltiazem dosing as above  Plan for follow up: 6 mos or sooner as needed  Medication Adjustments/Labs and Tests Ordered: Current medicines are reviewed at length with the patient today.  Concerns regarding medicines are outlined above.  Orders Placed This Encounter  Procedures  . EKG 12-Lead   Meds ordered this encounter  Medications  . diltiazem (CARDIZEM CD) 240 MG 24 hr capsule    Sig: Take 1 capsule (240 mg total) by mouth daily.     Dispense:  90 capsule    Refill:  2    Do not fill now, will call when she needs refills  . diltiazem (CARDIZEM) 30 MG tablet    Sig: Take 1 tablet (30 mg total) by mouth every 6 (six) hours as needed (Heart rate greater than 100).    Patient Instructions  Medication Instructions:  Decrease Diltiazem to 240 mg daily  *If you need a refill on your cardiac medications before your next appointment, please call your pharmacy*   Lab Work: None ordered    Testing/Procedures: None ordered    Follow-Up: At [facility], you and  your health needs are our priority.  As part of our continuing mission to provide you with exceptional heart care, we have created designated Provider Care Teams.  These Care Teams include your primary Cardiologist (physician) and Advanced Practice Providers (APPs -  Physician Assistants and Nurse Practitioners) who all work together to provide you with the care you need, when you need it.  We recommend signing up for the patient portal called "MyChart".  Sign up information is provided on this After Visit Summary.  MyChart is used to connect with patients for Virtual Visits (Telemedicine).  Patients are able to view lab/test results, encounter notes, upcoming appointments, etc.  Non-urgent messages can be sent to your provider as well.   To learn more about what you can do with MyChart, go to [email or web address].    Your next appointment:   6 month(s)  The format for your next appointment:   In Person  Provider:   Buford Dresser, MD       Signed, Debbie Dresser, MD PhD 09/01/2020     [place]

## 2020-09-01 NOTE — Patient Instructions (Signed)
Medication Instructions:  Decrease Diltiazem to 240 mg daily  *If you need a refill on your cardiac medications before your next appointment, please call your pharmacy*   Lab Work: None ordered    Testing/Procedures: None ordered    Follow-Up: At [facility], you and your health needs are our priority.  As part of our continuing mission to provide you with exceptional heart care, we have created designated Provider Care Teams.  These Care Teams include your primary Cardiologist (physician) and Advanced Practice Providers (APPs -  Physician Assistants and Nurse Practitioners) who all work together to provide you with the care you need, when you need it.  We recommend signing up for the patient portal called "MyChart".  Sign up information is provided on this After Visit Summary.  MyChart is used to connect with patients for Virtual Visits (Telemedicine).  Patients are able to view lab/test results, encounter notes, upcoming appointments, etc.  Non-urgent messages can be sent to your provider as well.   To learn more about what you can do with MyChart, go to [email or web address].    Your next appointment:   6 month(s)  The format for your next appointment:   In Person  Provider:   Buford Dresser, MD

## 2020-09-02 ENCOUNTER — Ambulatory Visit: Payer: Medicare Other | Admitting: Pharmacist

## 2020-09-02 DIAGNOSIS — I10 Essential (primary) hypertension: Secondary | ICD-10-CM

## 2020-09-02 DIAGNOSIS — I4891 Unspecified atrial fibrillation: Secondary | ICD-10-CM

## 2020-09-02 NOTE — Chronic Care Management (AMB) (Signed)
Chronic Care Management Pharmacy  Name: Debbie [name]  MRN: [id number] DOB: October 08, 1946  Initial Planning Appointment: completed 09/01/20  Initial Questions: 1. Have you seen any other providers since your last visit? n/a 2. Any changes in your medicines or health? No   Chief Complaint/ HPI  Debbie [name],  74 y.o. , female presents for their Initial CCM visit with the clinical pharmacist via telephone due to COVID-19 Pandemic.  PCP : [name], [name], MD  Their chronic conditions include: HTN, Afib, headaches, seizures, GERD, osteopenia, fibromyalgia, chronic pain, SOB  Office Visits: -06/17/20 Charlott Rakes, LPN: Patient presented for Medicare annual wellness exam.   -11/15/19 Betty [name], MD: Patient presented for video visit for COVID-19 infection and Afib and HTN follow up. Patient's BP is mildly elevated today and has a headache. Continue Symbicort 2 puffs BID and albuterol PRN. Continued BP medications.   Consult Visit: -09/01/20 [name], CPP (cardiology): Patient presented for anti-coag visit. INR 2.3, goal 2-3. Continue taking warfarin 5 mg every day. Follow up in 6 weeks.  09/01/20 [name], MD (cardiology): Patient presented for Afib follow up. Patient feels fatigue and blood pressures have been controlled at home. Was alternating diltiazem 240 mg and 360 mg every other day and decreased diltiazem to 240 mg daily. Follow up in 6 months.  07/06/20 [name], NP (neurology): Patient presented for follow up for headaches, tremor and fibromyalgia. Will order MRI of the brain without contrast to rule out stroke given episode in October, along with persistent symptoms. Follow up in 4 months.  03/31/20 [name], NP (pain management): Patient presented for pain management. Unable to access notes.  Medications: Outpatient Encounter Medications as of 09/02/2020  Medication Sig Note  . acetaminophen (TYLENOL) 325 MG tablet Take 2 tablets  (650 mg total) by mouth every 6 (six) hours. (Patient taking differently: Take 500 mg by mouth every 6 (six) hours. ) 06/17/2020: As need for pain  . acetaminophen (TYLENOL) 500 MG tablet Take 500 mg by mouth in the morning and at bedtime.   . Ascorbic Acid (VITAMIN C PO) Take 282 mg by mouth daily.    . calcium carbonate (OS-CAL) 600 MG TABS tablet Take 600 mg by mouth daily with breakfast. With vitamin D 800 units   . diltiazem (CARDIZEM CD) 240 MG 24 hr capsule Take 1 capsule (240 mg total) by mouth daily.   Marland Kitchen diltiazem (CARDIZEM) 30 MG tablet Take 1 tablet (30 mg total) by mouth every 6 (six) hours as needed (Heart rate greater than 100).   . gabapentin (NEURONTIN) 600 MG tablet Take 1 tablet (600 mg total) by mouth 3 (three) times daily. 06/17/2020: Taking 2 a day, pt states its helped swelling  . HYDROcodone-acetaminophen (NORCO/VICODIN) 5-325 MG tablet Take 1 tablet by mouth 3 (three) times daily as needed for moderate pain.  06/17/2020: Pt taking 2 a day  . lamoTRIgine (LAMICTAL) 25 MG tablet Take 1 tablet (25 mg total) by mouth 2 (two) times daily.   Marland Kitchen LEVALBUTEROL HCL IN Inhale into the lungs as needed.    . Multiple Vitamins-Minerals (ZINC PO) Take 50 mg by mouth daily.    . polyethylene glycol (MIRALAX / GLYCOLAX) packet Take 17 g by mouth as needed.   . promethazine (PHENERGAN) 25 MG tablet Take 6.25 mg by mouth every 6 (six) hours as needed for nausea or vomiting.   . vitamin B-12 (CYANOCOBALAMIN) 1000 MCG tablet Take 1,000 mcg by mouth daily.   . Vitamin  D, Ergocalciferol, (DRISDOL) 1.25 MG (50000 UT) CAPS capsule TAKE 1 CAPSULE BY MOUTH EVERY 10 DAYS   . warfarin (COUMADIN) 5 MG tablet TAKE 1/2 TO 1 TABLET BY MOUTH DAILY AS DIRECTED   . ALPRAZolam (XANAX) 0.5 MG tablet Take 1 tablet 30 minutes before MRI, may repeat x 1 if needed    No facility-administered encounter medications on file as of 09/02/2020.   Patient's daughter took her out on her birthday and they went shopping at  [employer] and then went to [employer]. She had german chocolate cake from [employer]. She lives with her husband and her daughter lives in the basement with her partner (boyfriend). Her daughter took off the day for her birthday and both her and her husband help the parents often.   Patient is currently having a problem with her thumb that started on Monday night and is very painful. [name], her daughter, helped her wrap it and it is feeling a bit better now but its painful to the touch.  Patient's HR has been very low lately in the 40-50s and she feels weak and tired. She had COVID in January and thinks this may be long haulers. Her oxygen level is now up to 94% and she doesn't have oxygen to use at home now as they did when she had COVID. She also used a nebulizer with COVID.  Patient was doing aquatherapy once a week until she left weak about 3 weeks ago. She wants to start going walking now that its cooler.    Current Diagnosis/Assessment:  Goals Addressed            This Visit's Progress   . Pharmacy Care Plan       CARE PLAN ENTRY (see longitudinal plan of care for additional care plan information)  Current Barriers:  . Chronic Disease Management support, education, and care coordination needs related to Hypertension, Atrial Fibrillation, GERD, Osteopenia, and headaches, seizures, fibromyalgia, chronic pain   Hypertension BP Readings from Last 3 Encounters:  09/01/20 122/72  07/06/20 133/72  06/17/20 135/74   . Pharmacist Clinical Goal(s): o Over the next 90 days, patient will work with PharmD and providers to maintain BP goal <130/80 . Current regimen:  o Diltiazem 240 mg 1 capsule daily . Interventions: o Discussed DASH eating plan recommendations: . Emphasizes vegetables, fruits, and whole-grains . Includes fat-free or low-fat dairy products, fish, poultry, beans, nuts, and vegetable oils . Limits foods that are high in saturated fat. These foods include fatty  meats, full-fat dairy products, and tropical oils such as coconut, palm kernel, and palm oils. . Limits sugar-sweetened beverages and sweets . Limiting sodium intake to < 1500 mg/day o Discussed recommendations for moderate aerobic exercise for 150 minutes/week spread out over 5 days for heart healthy lifestyle . Patient self care activities - Over the next 90 days, patient will: o Check blood pressure daily, document, and provide at future appointments o Ensure daily salt intake < 2300 mg/day  Afib . Pharmacist Clinical Goal(s): o Over the next 90 days, patient will work with PharmD and providers to maintain heart rate between 60 to 100 beats per minute . Current regimen:  . Warfarin 5 mg 1 tablet daily . Diltiazem 240 mg 1 capsule daily . Diltiazem 30 mg 1 tablet every 6 hours PRN for heart rate > 100 . Interventions: o Discussed monitoring for signs of bleeding such as unexplained and excessive bleeding from a cut or injury, easy or excessive bruising,  blood in urine or stools, and nosebleeds without a known cause . Patient self care activities - Over the next 90 days, patient will: - Continue current medications  Headaches . Pharmacist Clinical Goal(s): o Over the next 90 days, patient will work with PharmD and providers to prevent and treat headaches . Current regimen:  . Gabapentin 600 mg 1 tablet twice daily . Lamictal 25 mg 1 tablet twice daily . Patient self care activities - Over the next 90 days, patient will: o Increase back to gabapentin three times daily to see if she develops swelling again  Seizures . Pharmacist Clinical Goal(s) o Over the next 90 days, patient will work with PharmD and providers to prevent seizures . Current regimen:  o Lamictal 25 mg 1 tablet twice daily . Patient self care activities - Over the next 90 days, patient will: o Continue current medications  GERD . Pharmacist Clinical Goal(s) o Over the next 90 days, patient will work with PharmD  and providers to manage symptoms of heartburn . Current regimen:  o Tums as needed . Interventions: o Discussed Non-pharmacologic management of symptoms such as elevating the head of your bed, avoiding eating 2-3 hours before bed, avoiding triggering foods such as acidic, spicy, or fatty foods, eating smaller meals, and wearing clothes that are loose around the waist . Patient self care activities - Over the next 90 days, patient will: o Continue current management  Osteopenia . Pharmacist Clinical Goal(s) o Over the next 90 days, patient will work with PharmD and providers to support bone health . Current regimen:  Marland Kitchen Vitamin D 50,000 units 1 capsule every 10 days  . Calcium carbonate 600 mg & vitamin D3 1 tablet daily with breakfast . Interventions: o Discussed recommended [phone] units of vitamin D daily. And 1200 mg of calcium daily from dietary and supplemental sources.  o Recommended weight-bearing and muscle strengthening exercises for building and maintaining bone density.  . Patient self care activities - Over the next 90 days, patient will: o Complete bone density scan o Continue current supplementation  Chronic pain . Pharmacist Clinical Goal(s) o Over the next 90 days, patient will work with PharmD and providers to manage pain . Current regimen:  . Hydrocodone-APAP 5-325 mg 1 tablet twice times daily as needed  . Acetaminophen 500 mg 1 tablet twice daily . Interventions: o Discussed maximum recommend amount of Tylenol per day is 3,000 mg . Patient self care activities - Over the next 90 days, patient will: o Continue current medications  Medication management . Pharmacist Clinical Goal(s): o Over the next 90 days, patient will work with PharmD and providers to maintain optimal medication adherence . Current pharmacy: [facility] . Interventions o Comprehensive medication review performed. o Continue current medication management strategy . Patient self care activities -  Over the next 90 days, patient will: o Take medications as prescribed o Report any questions or concerns to PharmD and/or provider(s)  Initial goal documentation       AFIB   Patient is currently rate controlled. Office heart rates are  Pulse Readings from Last 3 Encounters:  09/01/20 64  07/06/20 64  06/17/20 (!) 58    CHA2DS2-VASc Score =   4 The patient's score is based upon: age, sex, hx of PE   Patient has failed these meds in past: unknown  Patient is currently uncontrolled on the following medications:  . Warfarin 5 mg 1 tablet daily . Diltiazem 240 mg 1 capsule daily . Diltiazem 30  mg 1 tablet every 6 hours PRN for HR > 100  We discussed:  monitoring for symptoms of bleeding (unexpected bruising, blood in urine or stool, nosebleeds); continued monitoring of heart rate  Plan  Continue current medications     Hypertension   BP goal is:  <130/80  Office blood pressures are  BP Readings from Last 3 Encounters:  09/01/20 122/72  07/06/20 133/72  06/17/20 135/74   Patient checks BP at home daily Patient home BP readings are ranging: 127/60-70s  Patient has failed these meds in the past: none Patient is currently controlled on the following medications:  . Diltiazem 240 mg 1 capsule daily  We discussed diet and exercise extensively -Diet: cook instapot; grease goes down under; chicken; uses a small amount of salt - uses spices -Exercise: walks with a cane; uses a walker -DASH eating plan recommendations: . Emphasizes vegetables, fruits, and whole-grains . Includes fat-free or low-fat dairy products, fish, poultry, beans, nuts, and vegetable oils . Limits foods that are high in saturated fat. These foods include fatty meats, full-fat dairy products, and tropical oils such as coconut, palm kernel, and palm oils. . Limits sugar-sweetened beverages and sweets . Limiting sodium intake to < 1500 mg/day  Plan  Continue current medications     Headaches    Patient is currently uncontrolled on the following medications:  . Gabapentin 600 mg 1 tablet twice daily . Lamictal 25 mg 1 tablet twice daily  We discussed:  Patient has decreased gabapentin to twice daily due to swelling over the summer but was unsure if this was related to gabapentin or summer time   Plan Patient will increase back to gabapentin three tablets daily to see if this helps with headaches.  Continue current medications   Seizures   Patient has failed these meds in past: none Patient is currently controlled on the following medications:  . Lamictal 25 mg 1 tablet twice daily  Plan  Continue current medications   GERD   Patient is currently controlled on the following medications:  . Tums PRN  We discussed:  Non-pharmacologic management of symptoms such as elevating the head of your bed, avoiding eating 2-3 hours before bed, avoiding triggering foods such as acidic, spicy, or fatty foods, eating smaller meals, and wearing clothes that are loose around the waist; can continue to use Tums PRN when she eats too much   Plan  Continue current medications  Osteopenia   Last DEXA Scan: 2012 (unable to find scan)  T-Score femoral neck: n/a  T-Score total hip: n/a  T-Score lumbar spine: n/a  T-Score forearm radius: n/a  10-year probability of major osteoporotic fracture: n/a  10-year probability of hip fracture: n/a  VITD  Date Value Ref Range Status  09/12/2018 45.75 30.00 - 100.00 ng/mL Final     Patient is not a candidate for pharmacologic treatment  Patient has failed these meds in past: none Patient is currently controlled on the following medications:  Marland Kitchen Vitamin D 50,000 units 1 capsule every 10 days  . Calcium carbonate 600 mg & vitamin D3 1 tablet daily with breakfast  We discussed:  Recommend [phone] units of vitamin D daily. Recommend 1200 mg of calcium daily from dietary and supplemental sources. Recommend weight-bearing and muscle  strengthening exercises for building and maintaining bone density.   Plan Encouraged patient to complete DEXA scan.  Recommend repeat vitamin D level as it hasn't been checked since 09/2018. Continue current medications    Fibromyalgia  Patient is currently controlled on the following medications:  . Gabapentin 600 mg 1 tablet twice daily  Plan  Continue current medications   Chronic pain   Patient has failed these meds in past: none Patient is currently controlled on the following medications:  . Hydrocodone-APAP 5-325 mg 1 tablet twice times daily as needed  . Acetaminophen 500 mg 1 tablet twice daily  We discussed:  Avoiding NSAIDs and maximum recommended dose of Tylenol is 3000 mg/day  Plan  Continue current medications   SOB   Patient has failed these meds in past: none Patient is currently controlled on the following medications:  . Levalbuterol inhaler PRN  We discussed: patient's last use was April or March with COVID but wants to keep on hand  Plan  Continue current medications   Miscellanous   Patient is currently  on the following medications:  . Zinc 50 1 tablet daily . Vitamin C 282 mg 1 gummy daily . Vitamin B12 1000 mcg daily . Miralax daily 1/2 capful  We discussed:  Patient can alternate miralax with a stool softener if she thinks it is too strong   Plan  Continue current medications   Vaccines   Reviewed and discussed patient's vaccination history.    Immunization History  Administered Date(s) Administered  . Influenza, High Dose Seasonal PF 10/28/2015, 12/09/2016, 09/08/2017, 09/12/2018  . Influenza-Unspecified 08/28/2012, 08/20/2013  . Pneumococcal Conjugate-13 09/12/2018  . Pneumococcal Polysaccharide-23 02/15/2012, 09/08/2017  . Td 02/15/2007    Plan  Recommended patient receive influenza, COVID, tetanus and Shingrix vaccines in office/at pharmacy.   Medication Management   Pt uses [facility] for all  medications  We discussed: Current pharmacy is preferred with insurance plan and patient is satisfied with pharmacy services  Plan  Continue current medication management strategy  Recommended follow up with Dr. [name] for annual visit.  Follow up: 3 month phone visit  Jeni Salles, PharmD Clinical Pharmacist [facility] [place] [phone]

## 2020-09-04 NOTE — Patient Instructions (Addendum)
Hi Debbie [name]!  It was lovely getting to speak with you over the phone! Below is a summary of some of the things that we talked about. Also, I wanted to remind you that you are due to get your shingles shot at the pharmacy and tetanus and flu shots as well. Please schedule your annual exam with Dr. [name] as soon as possible!  Please give me a call if you need anything or have any questions before our next touch base!  Best, Maddie  Jeni Salles, PharmD Clinical Pharmacist [place] [facility]   Visit Information  Goals Addressed            This Visit's Progress   . Pharmacy Care Plan       CARE PLAN ENTRY (see longitudinal plan of care for additional care plan information)  Current Barriers:  . Chronic Disease Management support, education, and care coordination needs related to Hypertension, Atrial Fibrillation, GERD, Osteopenia, and headaches, seizures, fibromyalgia, chronic pain   Hypertension BP Readings from Last 3 Encounters:  09/01/20 122/72  07/06/20 133/72  06/17/20 135/74   . Pharmacist Clinical Goal(s): o Over the next 90 days, patient will work with PharmD and providers to maintain BP goal <130/80 . Current regimen:  o Diltiazem 240 mg 1 capsule daily . Interventions: o Discussed DASH eating plan recommendations: . Emphasizes vegetables, fruits, and whole-grains . Includes fat-free or low-fat dairy products, fish, poultry, beans, nuts, and vegetable oils . Limits foods that are high in saturated fat. These foods include fatty meats, full-fat dairy products, and tropical oils such as coconut, palm kernel, and palm oils. . Limits sugar-sweetened beverages and sweets . Limiting sodium intake to < 1500 mg/day o Discussed recommendations for moderate aerobic exercise for 150 minutes/week spread out over 5 days for heart healthy lifestyle . Patient self care activities - Over the next 90 days, patient will: o Check blood pressure daily,  document, and provide at future appointments o Ensure daily salt intake < 2300 mg/day  Afib . Pharmacist Clinical Goal(s): o Over the next 90 days, patient will work with PharmD and providers to maintain heart rate between 60 to 100 beats per minute . Current regimen:  . Warfarin 5 mg 1 tablet daily . Diltiazem 240 mg 1 capsule daily . Diltiazem 30 mg 1 tablet every 6 hours PRN for heart rate > 100 . Interventions: o Discussed monitoring for signs of bleeding such as unexplained and excessive bleeding from a cut or injury, easy or excessive bruising, blood in urine or stools, and nosebleeds without a known cause . Patient self care activities - Over the next 90 days, patient will: - Continue current medications  Headaches . Pharmacist Clinical Goal(s): o Over the next 90 days, patient will work with PharmD and providers to prevent and treat headaches . Current regimen:  . Gabapentin 600 mg 1 tablet twice daily . Lamictal 25 mg 1 tablet twice daily . Patient self care activities - Over the next 90 days, patient will: o Increase back to gabapentin three times daily to see if she develops swelling again  Seizures . Pharmacist Clinical Goal(s) o Over the next 90 days, patient will work with PharmD and providers to prevent seizures . Current regimen:  o Lamictal 25 mg 1 tablet twice daily . Patient self care activities - Over the next 90 days, patient will: o Continue current medications  GERD . Pharmacist Clinical Goal(s) o Over the next 90 days, patient will work with PharmD  and providers to manage symptoms of heartburn . Current regimen:  o Tums as needed . Interventions: o Discussed Non-pharmacologic management of symptoms such as elevating the head of your bed, avoiding eating 2-3 hours before bed, avoiding triggering foods such as acidic, spicy, or fatty foods, eating smaller meals, and wearing clothes that are loose around the waist . Patient self care activities - Over the  next 90 days, patient will: o Continue current management  Osteopenia . Pharmacist Clinical Goal(s) o Over the next 90 days, patient will work with PharmD and providers to support bone health . Current regimen:  Marland Kitchen Vitamin D 50,000 units 1 capsule every 10 days  . Calcium carbonate 600 mg & vitamin D3 1 tablet daily with breakfast . Interventions: o Discussed recommended [phone] units of vitamin D daily. And 1200 mg of calcium daily from dietary and supplemental sources.  o Recommended weight-bearing and muscle strengthening exercises for building and maintaining bone density.  . Patient self care activities - Over the next 90 days, patient will: o Complete bone density scan o Continue current supplementation  Chronic pain . Pharmacist Clinical Goal(s) o Over the next 90 days, patient will work with PharmD and providers to manage pain . Current regimen:  . Hydrocodone-APAP 5-325 mg 1 tablet twice times daily as needed  . Acetaminophen 500 mg 1 tablet twice daily . Interventions: o Discussed maximum recommend amount of Tylenol per day is 3,000 mg . Patient self care activities - Over the next 90 days, patient will: o Continue current medications  Medication management . Pharmacist Clinical Goal(s): o Over the next 90 days, patient will work with PharmD and providers to maintain optimal medication adherence . Current pharmacy: [facility] . Interventions o Comprehensive medication review performed. o Continue current medication management strategy . Patient self care activities - Over the next 90 days, patient will: o Take medications as prescribed o Report any questions or concerns to PharmD and/or provider(s)  Initial goal documentation        Debbie [name] was given information about Chronic Care Management services today including:  1. CCM service includes personalized support from designated clinical staff supervised by her physician, including individualized plan of care and  coordination with other care providers 2. 24/7 contact phone numbers for assistance for urgent and routine care needs. 3. Standard insurance, coinsurance, copays and deductibles apply for chronic care management only during months in which we provide at least 20 minutes of these services. Most insurances cover these services at 100%, however patients may be responsible for any copay, coinsurance and/or deductible if applicable. This service may help you avoid the need for more expensive face-to-face services. 4. Only one practitioner may furnish and bill the service in a calendar month. 5. The patient may stop CCM services at any time (effective at the end of the month) by phone call to the office staff.  Patient agreed to services and verbal consent obtained.   The patient verbalized understanding of instructions provided today and agreed to receive a mailed copy of patient instruction and/or educational materials. Telephone follow up appointment with pharmacy team member scheduled for: 3 months   Osteopenia  Osteopenia is a loss of thickness (density) inside of the bones. Another name for osteopenia is low bone mass. Mild osteopenia is a normal part of aging. It is not a disease, and it does not cause symptoms. However, if you have osteopenia and continue to lose bone mass, you could develop a condition that causes the  bones to become thin and break more easily (osteoporosis). You may also lose some height, have back pain, and have a stooped posture. Although osteopenia is not a disease, making changes to your lifestyle and diet can help to prevent osteopenia from developing into osteoporosis. What are the causes? Osteopenia is caused by loss of calcium in the bones.  Bones are constantly changing. Old bone cells are continually being replaced with new bone cells. This process builds new bone. The mineral calcium is needed to build new bone and maintain bone density. Bone density is usually  highest around age 49. After that, most people's bodies cannot replace all the bone they have lost with new bone. What increases the risk? You are more likely to develop this condition if:  You are older than age 58.  You are a woman who went through menopause early.  You have a long illness that keeps you in bed.  You do not get enough exercise.  You lack certain nutrients (malnutrition).  You have an overactive thyroid gland (hyperthyroidism).  You smoke.  You drink a lot of alcohol.  You are taking medicines that weaken the bones, such as steroids. What are the signs or symptoms? This condition does not cause any symptoms. You may have a slightly higher risk for bone breaks (fractures), so getting fractures more easily than normal may be an indication of osteopenia. How is this diagnosed? Your health care provider can diagnose this condition with a special type of X-ray exam that measures bone density (dual-energy X-ray absorptiometry, DEXA). This test can measure bone density in your hips, spine, and wrists. Osteopenia has no symptoms, so this condition is usually diagnosed after a routine bone density screening test is done for osteoporosis. This routine screening is usually done for:  Women who are age 33 or older.  Men who are age 38 or older. If you have risk factors for osteopenia, you may have the screening test at an earlier age. How is this treated? Making dietary and lifestyle changes can lower your risk for osteoporosis. If you have severe osteopenia that is close to becoming osteoporosis, your health care provider may prescribe medicines and dietary supplements such as calcium and vitamin D. These supplements help to rebuild bone density. Follow these instructions at home:   Take over-the-counter and prescription medicines only as told by your health care provider. These include vitamins and supplements.  Eat a diet that is high in calcium and vitamin  D. ? Calcium is found in dairy products, beans, salmon, and leafy green vegetables like spinach and broccoli. ? Look for foods that have vitamin D and calcium added to them (fortified foods), such as orange juice, cereal, and bread.  Do 30 or more minutes of a weight-bearing exercise every day, such as walking, jogging, or playing a sport. These types of exercises strengthen the bones.  Take precautions at home to lower your risk of falling, such as: ? Keeping rooms well-lit and free of clutter, such as cords. ? Installing safety rails on stairs. ? Using rubber mats in the bathroom or other areas that are often wet or slippery.  Do not use any products that contain nicotine or tobacco, such as cigarettes and e-cigarettes. If you need help quitting, ask your health care provider.  Avoid alcohol or limit alcohol intake to no more than 1 drink a day for nonpregnant women and 2 drinks a day for men. One drink equals 12 oz of beer, 5 oz of wine,  or 1 oz of hard liquor.  Keep all follow-up visits as told by your health care provider. This is important. Contact a health care provider if:  You have not had a bone density screening for osteoporosis and you are: ? A woman, age 41 or older. ? A man, age 49 or older.  You are a postmenopausal woman who has not had a bone density screening for osteoporosis.  You are older than age 68 and you want to know if you should have bone density screening for osteoporosis. Summary  Osteopenia is a loss of thickness (density) inside of the bones. Another name for osteopenia is low bone mass.  Osteopenia is not a disease, but it may increase your risk for a condition that causes the bones to become thin and break more easily (osteoporosis).  You may be at risk for osteopenia if you are older than age 32 or if you are a woman who went through early menopause.  Osteopenia does not cause any symptoms, but it can be diagnosed with a bone density screening  test.  Dietary and lifestyle changes are the first treatment for osteopenia. These may lower your risk for osteoporosis. This information is not intended to replace advice given to you by your health care provider. Make sure you discuss any questions you have with your health care provider. Document Revised: 10/06/2017 Document Reviewed: 08/02/2017 Elsevier Patient Education  2020 [employer].

## 2020-09-07 DIAGNOSIS — Z79899 Other long term (current) drug therapy: Secondary | ICD-10-CM | POA: Diagnosis not present

## 2020-09-07 DIAGNOSIS — M545 Low back pain, unspecified: Secondary | ICD-10-CM | POA: Diagnosis not present

## 2020-09-07 DIAGNOSIS — G894 Chronic pain syndrome: Secondary | ICD-10-CM | POA: Diagnosis not present

## 2020-09-07 DIAGNOSIS — G8929 Other chronic pain: Secondary | ICD-10-CM | POA: Diagnosis not present

## 2020-09-28 ENCOUNTER — Other Ambulatory Visit: Payer: Self-pay | Admitting: Family Medicine

## 2020-09-28 DIAGNOSIS — E559 Vitamin D deficiency, unspecified: Secondary | ICD-10-CM

## 2020-10-06 ENCOUNTER — Other Ambulatory Visit: Payer: Self-pay

## 2020-10-06 ENCOUNTER — Ambulatory Visit
Admission: RE | Admit: 2020-10-06 | Discharge: 2020-10-06 | Disposition: A | Discharge status: Home / Self Care | Payer: Medicare Other | Source: Ambulatory Visit | Attending: Family Medicine | Admitting: Family Medicine

## 2020-10-06 DIAGNOSIS — M810 Age-related osteoporosis without current pathological fracture: Secondary | ICD-10-CM | POA: Diagnosis not present

## 2020-10-06 DIAGNOSIS — Z780 Asymptomatic menopausal state: Secondary | ICD-10-CM | POA: Diagnosis not present

## 2020-10-06 DIAGNOSIS — E2839 Other primary ovarian failure: Secondary | ICD-10-CM

## 2020-10-07 DIAGNOSIS — M545 Low back pain, unspecified: Secondary | ICD-10-CM | POA: Diagnosis not present

## 2020-10-07 DIAGNOSIS — G8929 Other chronic pain: Secondary | ICD-10-CM | POA: Diagnosis not present

## 2020-10-07 DIAGNOSIS — Z79899 Other long term (current) drug therapy: Secondary | ICD-10-CM | POA: Diagnosis not present

## 2020-10-07 DIAGNOSIS — G894 Chronic pain syndrome: Secondary | ICD-10-CM | POA: Diagnosis not present

## 2020-10-13 ENCOUNTER — Other Ambulatory Visit: Payer: Self-pay

## 2020-10-13 ENCOUNTER — Ambulatory Visit (INDEPENDENT_AMBULATORY_CARE_PROVIDER_SITE_OTHER): Payer: Medicare Other | Admitting: *Deleted

## 2020-10-13 DIAGNOSIS — Z5181 Encounter for therapeutic drug level monitoring: Secondary | ICD-10-CM | POA: Diagnosis not present

## 2020-10-13 DIAGNOSIS — Z86711 Personal history of pulmonary embolism: Secondary | ICD-10-CM

## 2020-10-13 DIAGNOSIS — Z7901 Long term (current) use of anticoagulants: Secondary | ICD-10-CM

## 2020-10-13 LAB — POCT INR: INR: 2.8 (ref 2.0–3.0)

## 2020-10-13 NOTE — Patient Instructions (Signed)
Description   Continue taking 1 tablet daily. Recheck INR in 6 weeks. Call [facility] [phone] if you have any changes in medications.

## 2020-10-19 ENCOUNTER — Telehealth: Payer: Self-pay | Admitting: Family Medicine

## 2020-10-19 NOTE — Telephone Encounter (Signed)
Low bone density, osteopenia. The risk of having hip fracture is high. Fosamax or similar oral medication recommended as first line of treatment [I do not think she has taken med in the past]. Medication has some side effects: mainly gastrointestinal, achy bones, and rarely atypical fractures and can affect mandibular bone. If any major dental procedure is needed, it needs to be competed before starting medication. Continue calcium and vit D supplementation. Regular physical activity and fall prevention recommended. Thanks, BJ

## 2020-10-19 NOTE — Telephone Encounter (Signed)
Please advise 

## 2020-10-19 NOTE — Telephone Encounter (Signed)
Pt is calling in stating that she would like to her done density results pt is aware that the provider has not resulted but when she does someone in the office will give her a call with the results

## 2020-10-20 MED ORDER — ALENDRONATE SODIUM 70 MG PO TABS: 70.0000 mg | ORAL_TABLET | ORAL | 0 refills | Status: DC

## 2020-10-20 NOTE — Telephone Encounter (Signed)
Informed patient of results and patient verbalized understanding. Rx sent in.

## 2020-11-01 ENCOUNTER — Encounter: Payer: Self-pay | Admitting: Cardiology

## 2020-11-05 ENCOUNTER — Telehealth: Payer: Self-pay | Admitting: Family Medicine

## 2020-11-05 ENCOUNTER — Other Ambulatory Visit: Payer: Self-pay | Admitting: Cardiology

## 2020-11-05 ENCOUNTER — Ambulatory Visit: Payer: Medicare Other | Admitting: Neurology

## 2020-11-05 NOTE — Telephone Encounter (Signed)
Patient is calling and is requesting a call back regarding alendronate (FOSAMAX) 70 MG tablet , please advise. CB is [phone]

## 2020-11-09 ENCOUNTER — Other Ambulatory Visit: Payer: Self-pay | Admitting: Family Medicine
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
## 2020-11-10 ENCOUNTER — Ambulatory Visit (INDEPENDENT_AMBULATORY_CARE_PROVIDER_SITE_OTHER): Payer: Medicare Other | Admitting: Neurology

## 2020-11-10 ENCOUNTER — Encounter: Payer: Self-pay | Admitting: Neurology

## 2020-11-10 VITALS — BP 108/59 | HR 52 | Ht 68.0 in | Wt 225.2 lb

## 2020-11-10 DIAGNOSIS — R569 Unspecified convulsions: Secondary | ICD-10-CM

## 2020-11-10 DIAGNOSIS — R251 Tremor, unspecified: Secondary | ICD-10-CM

## 2020-11-10 DIAGNOSIS — R42 Dizziness and giddiness: Secondary | ICD-10-CM

## 2020-11-10 DIAGNOSIS — H905 Unspecified sensorineural hearing loss: Secondary | ICD-10-CM

## 2020-11-10 DIAGNOSIS — G4489 Other headache syndrome: Secondary | ICD-10-CM

## 2020-11-10 DIAGNOSIS — G43009 Migraine without aura, not intractable, without status migrainosus: Secondary | ICD-10-CM

## 2020-11-10 NOTE — Progress Notes (Signed)
I have read the note, and I agree with the clinical assessment and plan.  Charles K Willis   

## 2020-11-10 NOTE — Patient Instructions (Signed)
Check blood work today  Continue Lamictal  See you back in 6 months

## 2020-11-11 ENCOUNTER — Telehealth: Payer: Self-pay

## 2020-11-11 LAB — C-REACTIVE PROTEIN: CRP: 4 mg/L (ref 0–10)

## 2020-11-11 LAB — SEDIMENTATION RATE: Sed Rate: 11 mm/hr (ref 0–40)

## 2020-11-11 NOTE — Telephone Encounter (Signed)
Called and spoke to Debbie [name] regarding lab results. She was notified that labs are normal and was reminded to come in for additional labs. She said she didn't know when she would make it but would come in when she can.

## 2020-11-12 NOTE — Telephone Encounter (Signed)
Spoke with the pt and she stated she wanted to let Dr [place] know she took 2 Fosamax tablets and noticed shoulder, wrist, ankle pain and difficulty walking so she decided to discontinue taking this.  Message sent to PCP.

## 2020-11-18 ENCOUNTER — Other Ambulatory Visit: Payer: Self-pay | Admitting: *Deleted

## 2020-11-18 NOTE — Telephone Encounter (Signed)
Pt called and stated she is not taking Fosomax anymore as of today and now taking Boniva 150mg  q month Updated list for the pt and made aware it is safe to take with her Warfarin.

## 2020-12-01 ENCOUNTER — Ambulatory Visit (INDEPENDENT_AMBULATORY_CARE_PROVIDER_SITE_OTHER): Payer: Medicare Other | Admitting: *Deleted

## 2020-12-01 ENCOUNTER — Other Ambulatory Visit: Payer: Self-pay

## 2020-12-01 DIAGNOSIS — Z86711 Personal history of pulmonary embolism: Secondary | ICD-10-CM

## 2020-12-01 DIAGNOSIS — Z5181 Encounter for therapeutic drug level monitoring: Secondary | ICD-10-CM | POA: Diagnosis not present

## 2020-12-01 DIAGNOSIS — Z7901 Long term (current) use of anticoagulants: Secondary | ICD-10-CM

## 2020-12-01 LAB — POCT INR: INR: 3 (ref 2.0–3.0)

## 2020-12-01 NOTE — Patient Instructions (Signed)
Description   Continue taking Warfarin 1 tablet daily. Recheck INR in 6 weeks. Call [facility] [phone] if you have any changes in medications.      

## 2020-12-03 ENCOUNTER — Telehealth: Payer: Medicare Other

## 2020-12-07 ENCOUNTER — Ambulatory Visit: Payer: Medicare Other | Admitting: Pharmacist

## 2020-12-07 DIAGNOSIS — I4891 Unspecified atrial fibrillation: Secondary | ICD-10-CM

## 2020-12-07 DIAGNOSIS — I10 Essential (primary) hypertension: Secondary | ICD-10-CM

## 2020-12-07 NOTE — Chronic Care Management (AMB) (Signed)
Chronic Care Management Pharmacy  Name: Debbie [name]  MRN: [id number] DOB: [birth year]  Initial Questions: 1. Have you seen any other providers since your last visit? n/a 2. Any changes in your medicines or health? No   Chief Complaint/ HPI  Debbie [name],  75 y.o. , female presents for their Follow-Up CCM visit with the clinical pharmacist via telephone due to COVID-19 Pandemic.  PCP : [name], [name], MD  Their chronic conditions include: HTN, Afib, headaches, seizures, GERD, osteopenia, fibromyalgia, chronic pain, SOB  Office Visits: -06/17/20 Charlott Rakes, LPN: Patient presented for Medicare annual wellness exam.   -11/15/19 Betty [name], MD: Patient presented for video visit for COVID-19 infection and Afib and HTN follow up. Patient's BP is mildly elevated today and has a headache. Continue Symbicort 2 puffs BID and albuterol PRN. Continued BP medications.   Consult Visit: -12/02/19 Derrel Nip, RN (cardiology): Patient presented for anti-coag visit. INR 3.0, goal 2-3. Continued 5 mg daily.  -11/10/20 [name], NP (neurology): Patient presented for tremor follow up.   -09/07/20 [name], NP: Patient presented for pain management visit. Unable to access notes.  -09/01/20 [name], CPP (cardiology): Patient presented for anti-coag visit. INR 2.3, goal 2-3. Continue taking warfarin 5 mg every day. Follow up in 6 weeks.  09/01/20 [name], MD (cardiology): Patient presented for Afib follow up. Patient feels fatigue and blood pressures have been controlled at home. Was alternating diltiazem 240 mg and 360 mg every other day and decreased diltiazem to 240 mg daily. Follow up in 6 months.  07/06/20 [name], NP (neurology): Patient presented for follow up for headaches, tremor and fibromyalgia. Will order MRI of the brain without contrast to rule out stroke given episode in October, along with persistent symptoms. Follow up in 4  months.  03/31/20 [name], NP (pain management): Patient presented for pain management. Unable to access notes.  Medications: Outpatient Encounter Medications as of 12/07/2020  Medication Sig Note  . acetaminophen (TYLENOL) 325 MG tablet Take 2 tablets (650 mg total) by mouth every 6 (six) hours. (Patient not taking: Reported on 11/10/2020) 06/17/2020: As need for pain  . acetaminophen (TYLENOL) 500 MG tablet Take 500 mg by mouth in the morning and at bedtime.   . Ascorbic Acid (VITAMIN C PO) Take 282 mg by mouth daily.    . calcium carbonate (OS-CAL) 600 MG TABS tablet Take 600 mg by mouth daily with breakfast. With vitamin D 800 units   . diltiazem (CARDIZEM CD) 240 MG 24 hr capsule Take 1 capsule (240 mg total) by mouth daily.   Marland Kitchen diltiazem (CARDIZEM) 30 MG tablet Take 1 tablet (30 mg total) by mouth every 6 (six) hours as needed (Heart rate greater than 100).   . gabapentin (NEURONTIN) 600 MG tablet Take 1 tablet (600 mg total) by mouth 3 (three) times daily. 06/17/2020: Taking 2 a day, pt states its helped swelling  . HYDROcodone-acetaminophen (NORCO/VICODIN) 5-325 MG tablet Take 1 tablet by mouth 3 (three) times daily as needed for moderate pain.  06/17/2020: Pt taking 2 a day  . ibandronate (BONIVA) 150 MG tablet Take 150 mg by mouth every 30 (thirty) days. Take in the morning with a full glass of water, on an empty stomach, and do not take anything else by mouth or lie down for the next 30 min.   . lamoTRIgine (LAMICTAL) 25 MG tablet Take 1 tablet (25 mg total) by mouth 2 (two) times daily.   Marland Kitchen LEVALBUTEROL  HCL IN Inhale into the lungs as needed.    . Multiple Vitamins-Minerals (ZINC PO) Take 50 mg by mouth daily.    . polyethylene glycol (MIRALAX / GLYCOLAX) packet Take 17 g by mouth as needed.   . promethazine (PHENERGAN) 25 MG tablet Take 6.25 mg by mouth every 6 (six) hours as needed for nausea or vomiting.   . vitamin B-12 (CYANOCOBALAMIN) 1000 MCG tablet Take 1,000 mcg by mouth  daily.   . Vitamin D, Ergocalciferol, (DRISDOL) 1.25 MG (50000 UNIT) CAPS capsule TAKE 1 CAPSULE BY MOUTH EVERY 10 DAYS   . warfarin (COUMADIN) 5 MG tablet TAKE 1/2 TO 1 TABLET BY MOUTH DAILY AS DIRECTED    No facility-administered encounter medications on file as of 12/07/2020.   Patient had a bad reaction with Fosamax, was having joint pain and she is hurting. Patient has switched to [name]. She has also tried [name] but has been having flu like symptoms and is having aching all over her body. She took her first dose on January 15th of and was had 2 weeks of Fosamax prior.  Right now, she currently has been taking Norco three times daily per suggestion from PT which has been helping with her sleep as well. She has also been taking 2 tylenol & gabapentin daily to help with pain.  Current Diagnosis/Assessment:  Goals Addressed            This Visit's Progress   . Pharmacy Care Plan       CARE PLAN ENTRY (see longitudinal plan of care for additional care plan information)  Current Barriers:  . Chronic Disease Management support, education, and care coordination needs related to Hypertension, Atrial Fibrillation, GERD, Osteopenia, and headaches, seizures, fibromyalgia, chronic pain   Hypertension BP Readings from Last 3 Encounters:  11/10/20 (!) 108/59  09/01/20 122/72  07/06/20 133/72   . Pharmacist Clinical Goal(s): o Over the next 90 days, patient will work with PharmD and providers to maintain BP goal <130/80 . Current regimen:  o Diltiazem 240 mg 1 capsule daily . Interventions: o Discussed DASH eating plan recommendations: . Emphasizes vegetables, fruits, and whole-grains . Includes fat-free or low-fat dairy products, fish, poultry, beans, nuts, and vegetable oils . Limits foods that are high in saturated fat. These foods include fatty meats, full-fat dairy products, and tropical oils such as coconut, palm kernel, and palm oils. . Limits sugar-sweetened  beverages and sweets . Limiting sodium intake to < 1500 mg/day o Discussed recommendations for moderate aerobic exercise for 150 minutes/week spread out over 5 days for heart healthy lifestyle . Patient self care activities - Over the next 90 days, patient will: o Check blood pressure daily, document, and provide at future appointments o Ensure daily salt intake < 2300 mg/day  Afib . Pharmacist Clinical Goal(s): o Over the next 90 days, patient will work with PharmD and providers to maintain heart rate < 110 beats per minute . Current regimen:  . Warfarin 5 mg 1 tablet daily . Diltiazem 240 mg 1 capsule daily . Diltiazem 30 mg 1 tablet every 6 hours PRN for heart rate > 100 . Interventions: o Discussed monitoring for signs of bleeding such as unexplained and excessive bleeding from a cut or injury, easy or excessive bruising, blood in urine or stools, and nosebleeds without a known cause . Patient self care activities - Over the next 90 days, patient will: - Continue current medications  Headaches . Pharmacist Clinical Goal(s): o Over the next  90 days, patient will work with PharmD and providers to prevent and treat headaches . Current regimen:  . Gabapentin 600 mg 1 tablet twice daily . Lamictal 25 mg 1 tablet twice daily . Patient self care activities - Over the next 90 days, patient will: o Increase back to gabapentin three times daily to see if she develops swelling again  Seizures . Pharmacist Clinical Goal(s) o Over the next 90 days, patient will work with PharmD and providers to prevent seizures . Current regimen:  o Lamictal 25 mg 1 tablet twice daily . Patient self care activities - Over the next 90 days, patient will: o Continue current medications  GERD . Pharmacist Clinical Goal(s) o Over the next 90 days, patient will work with PharmD and providers to manage symptoms of heartburn . Current regimen:  o Tums as needed . Interventions: o Discussed Non-pharmacologic  management of symptoms such as elevating the head of your bed, avoiding eating 2-3 hours before bed, avoiding triggering foods such as acidic, spicy, or fatty foods, eating smaller meals, and wearing clothes that are loose around the waist . Patient self care activities - Over the next 90 days, patient will: o Continue current management  Osteopenia . Pharmacist Clinical Goal(s) o Over the next 90 days, patient will work with PharmD and providers to support bone health . Current regimen:  Marland Kitchen Vitamin D 50,000 units 1 capsule every 10 days  . Calcium carbonate 600 mg & vitamin D3 1 tablet daily with breakfast . Interventions: o Discussed recommended [phone] units of vitamin D daily. And 1200 mg of calcium daily from dietary and supplemental sources.  o Recommended weight-bearing and muscle strengthening exercises for building and maintaining bone density.  . Patient self care activities - Over the next 90 days, patient will: o Continue current supplementation o Hold ibandronate for a month to see if this is the cause of current symptoms  Chronic pain . Pharmacist Clinical Goal(s) o Over the next 90 days, patient will work with PharmD and providers to manage pain . Current regimen:  . Hydrocodone-APAP 5-325 mg 1 tablet twice times daily as needed  . Acetaminophen 500 mg 1 tablet twice daily . Interventions: o Discussed maximum recommend amount of Tylenol per day is 3,000 mg . Patient self care activities - Over the next 90 days, patient will: o Continue current medications  Medication management . Pharmacist Clinical Goal(s): o Over the next 90 days, patient will work with PharmD and providers to maintain optimal medication adherence . Current pharmacy: [facility] . Interventions o Comprehensive medication review performed. o Continue current medication management strategy . Patient self care activities - Over the next 90 days, patient will: o Take medications as prescribed o Report any  questions or concerns to PharmD and/or provider(s)  Please see past updates related to this goal by clicking on the "Past Updates" button in the selected goal        AFIB   Patient is currently rate controlled. Office heart rates are  Pulse Readings from Last 3 Encounters:  11/10/20 (!) 52  09/01/20 64  07/06/20 64   Patient feels better when pulse is in the 60s than 50s Pulse has been in the 50s lately  CHA2DS2-VASc Score =   4 The patient's score is based upon: age, sex, hx of PE   Patient has failed these meds in past: unknown  Patient is currently uncontrolled on the following medications:  . Warfarin 5 mg 1 tablet daily . Diltiazem  240 mg 1 capsule daily . Diltiazem 30 mg 1 tablet every 6 hours PRN for HR > 100  We discussed:  monitoring for symptoms of bleeding (unexpected bruising, blood in urine or stool, nosebleeds); continued monitoring of heart rate  Plan  Continue current medications    Hypertension   BP goal is:  <130/80  Office blood pressures are  BP Readings from Last 3 Encounters:  11/10/20 (!) 108/59  09/01/20 122/72  07/06/20 133/72   Patient checks BP at home daily Patient home BP readings are ranging: 125/72  Patient has failed these meds in the past: none Patient is currently controlled on the following medications:  . Diltiazem 240 mg 1 capsule daily  We discussed diet and exercise extensively -Exercise: walks with a cane; uses a walker -DASH eating plan recommendations: . Emphasizes vegetables, fruits, and whole-grains . Includes fat-free or low-fat dairy products, fish, poultry, beans, nuts, and vegetable oils . Limits foods that are high in saturated fat. These foods include fatty meats, full-fat dairy products, and tropical oils such as coconut, palm kernel, and palm oils. . Limits sugar-sweetened beverages and sweets . Limiting sodium intake to < 1500 mg/day  Plan  Continue current medications     Headaches   Patient is  currently uncontrolled on the following medications:  . Gabapentin 600 mg 1 tablet twice daily . Lamictal 25 mg 1 tablet twice daily  We discussed:  Patient has decreased gabapentin to twice daily due to swelling over the summer but was unsure if this was related to gabapentin or summer time   Plan Patient will increase back to gabapentin three tablets daily to see if this helps with headaches.  Continue current medications   Seizures   Patient has failed these meds in past: none Patient is currently controlled on the following medications:  . Lamictal 25 mg 1 tablet twice daily  We discussed: patient is worried about taking Lamictal long term  Plan Recommended to discussed with [name] - will plan to reach out about checking Lamictal level. Continue current medications   GERD   Patient is currently controlled on the following medications:  . Tums PRN  We discussed:  Non-pharmacologic management of symptoms such as elevating the head of your bed, avoiding eating 2-3 hours before bed, avoiding triggering foods such as acidic, spicy, or fatty foods, eating smaller meals, and wearing clothes that are loose around the waist; can continue to use Tums PRN when she eats too much   Plan  Continue current medications  Osteopenia   Last DEXA Scan: 09/2020  T-Score femoral neck: -2.3  T-Score total hip: n/a  T-Score lumbar spine: n/a  T-Score forearm radius: -0.2  10-year probability of major osteoporotic fracture: 13.7%  10-year probability of hip fracture: 3.7%  VITD  Date Value Ref Range Status  09/12/2018 45.75 30.00 - 100.00 ng/mL Final     Patient is a candidate for pharmacologic treatment due to T-Score -1.0 to -2.5 and 10-year risk of hip fracture > 3%  Patient has failed these meds in past: Fosamax (body aches) Patient is currently controlled on the following medications:  Marland Kitchen Vitamin D 50,000 units 1 capsule every 10 days  . Calcium carbonate 600 mg & vitamin D3  1 tablet daily with breakfast . Ibandronate monthly  We discussed:  Recommend [phone] units of vitamin D daily. Recommend 1200 mg of calcium daily from dietary and supplemental sources. Recommend weight-bearing and muscle strengthening exercises for building and maintaining bone density. ;  discussed other options for bone building medications such as a trial of risedronate; patient does not want to pursue injectable therapy due to risk of side effects and having medication lasting in her body for a long time  Plan Recommend repeat vitamin D level as it hasn't been checked since 09/2018. Continue current medications   Fibromyalgia   Patient is currently controlled on the following medications:  . Gabapentin 600 mg 1 tablet twice daily  We discussed: patient was previously on gabapentin three times daily and found benefit from this  Plan Managed by pain management. Recommended to reach out to pain management to request higher dose of gabapentin as patient may benefit with ongoing pain.  Continue current medications   Chronic pain   Patient has failed these meds in past: none Patient is currently controlled on the following medications:  . Hydrocodone-APAP 5-325 mg 1 tablet twice times daily as needed  . Acetaminophen 500 mg 1 tablet twice daily  We discussed:  Avoiding NSAIDs and maximum recommended dose of Tylenol is 3000 mg/day  Plan  Continue current medications   SOB   Patient has failed these meds in past: none Patient is currently controlled on the following medications:  . Levalbuterol inhaler PRN  We discussed: patient's last use was April or March with COVID but wants to keep on hand  Plan  Continue current medications   Miscellanous   Patient is currently  on the following medications:  . Zinc 50 1 tablet daily . Vitamin C 282 mg 1 gummy daily . Vitamin B12 1000 mcg daily . Miralax daily 1/2 capful  We discussed:  Patient can alternate miralax with a  stool softener if she thinks it is too strong   Plan  Continue current medications   Vaccines   Reviewed and discussed patient's vaccination history.    Immunization History  Administered Date(s) Administered  . Influenza, High Dose Seasonal PF 10/28/2015, 12/09/2016, 09/08/2017, 09/12/2018  . Influenza-Unspecified 08/28/2012, 08/20/2013  . Pneumococcal Conjugate-13 09/12/2018  . Pneumococcal Polysaccharide-23 02/15/2012, 09/08/2017  . Td 02/15/2007    Plan  Recommended patient receive influenza, COVID, tetanus and Shingrix vaccines in office/at pharmacy.   Medication Management   Pt uses [facility] for all medications  We discussed: Current pharmacy is preferred with insurance plan and patient is satisfied with pharmacy services  Plan  Continue current medication management strategy    Recommended follow up with Dr. [name] for annual visit.   Follow up: 3 month phone visit  Jeni Salles, PharmD Clinical Pharmacist [facility] [phone]

## 2020-12-16 ENCOUNTER — Other Ambulatory Visit: Payer: Self-pay | Admitting: Neurology

## 2020-12-16 DIAGNOSIS — R42 Dizziness and giddiness: Secondary | ICD-10-CM

## 2020-12-16 NOTE — Patient Instructions (Signed)
Visit Information  Goals Addressed            This Visit's Progress   . Pharmacy Care Plan       CARE PLAN ENTRY (see longitudinal plan of care for additional care plan information)  Current Barriers:  . Chronic Disease Management support, education, and care coordination needs related to Hypertension, Atrial Fibrillation, GERD, Osteopenia, and headaches, seizures, fibromyalgia, chronic pain   Hypertension BP Readings from Last 3 Encounters:  11/10/20 (!) 108/59  09/01/20 122/72  07/06/20 133/72   . Pharmacist Clinical Goal(s): o Over the next 90 days, patient will work with PharmD and providers to maintain BP goal <130/80 . Current regimen:  o Diltiazem 240 mg 1 capsule daily . Interventions: o Discussed DASH eating plan recommendations: . Emphasizes vegetables, fruits, and whole-grains . Includes fat-free or low-fat dairy products, fish, poultry, beans, nuts, and vegetable oils . Limits foods that are high in saturated fat. These foods include fatty meats, full-fat dairy products, and tropical oils such as coconut, palm kernel, and palm oils. . Limits sugar-sweetened beverages and sweets . Limiting sodium intake to < 1500 mg/day o Discussed recommendations for moderate aerobic exercise for 150 minutes/week spread out over 5 days for heart healthy lifestyle . Patient self care activities - Over the next 90 days, patient will: o Check blood pressure daily, document, and provide at future appointments o Ensure daily salt intake < 2300 mg/day  Afib . Pharmacist Clinical Goal(s): o Over the next 90 days, patient will work with PharmD and providers to maintain heart rate < 110 beats per minute . Current regimen:  . Warfarin 5 mg 1 tablet daily . Diltiazem 240 mg 1 capsule daily . Diltiazem 30 mg 1 tablet every 6 hours PRN for heart rate > 100 . Interventions: o Discussed monitoring for signs of bleeding such as unexplained and excessive bleeding from a cut or injury, easy or  excessive bruising, blood in urine or stools, and nosebleeds without a known cause . Patient self care activities - Over the next 90 days, patient will: - Continue current medications  Headaches . Pharmacist Clinical Goal(s): o Over the next 90 days, patient will work with PharmD and providers to prevent and treat headaches . Current regimen:  . Gabapentin 600 mg 1 tablet twice daily . Lamictal 25 mg 1 tablet twice daily . Patient self care activities - Over the next 90 days, patient will: o Increase back to gabapentin three times daily to see if she develops swelling again  Seizures . Pharmacist Clinical Goal(s) o Over the next 90 days, patient will work with PharmD and providers to prevent seizures . Current regimen:  o Lamictal 25 mg 1 tablet twice daily . Patient self care activities - Over the next 90 days, patient will: o Continue current medications  GERD . Pharmacist Clinical Goal(s) o Over the next 90 days, patient will work with PharmD and providers to manage symptoms of heartburn . Current regimen:  o Tums as needed . Interventions: o Discussed Non-pharmacologic management of symptoms such as elevating the head of your bed, avoiding eating 2-3 hours before bed, avoiding triggering foods such as acidic, spicy, or fatty foods, eating smaller meals, and wearing clothes that are loose around the waist . Patient self care activities - Over the next 90 days, patient will: o Continue current management  Osteopenia . Pharmacist Clinical Goal(s) o Over the next 90 days, patient will work with PharmD and providers to support bone health .  Current regimen:  Marland Kitchen Vitamin D 50,000 units 1 capsule every 10 days  . Calcium carbonate 600 mg & vitamin D3 1 tablet daily with breakfast . Interventions: o Discussed recommended [phone] units of vitamin D daily. And 1200 mg of calcium daily from dietary and supplemental sources.  o Recommended weight-bearing and muscle strengthening  exercises for building and maintaining bone density.  . Patient self care activities - Over the next 90 days, patient will: o Continue current supplementation o Hold ibandronate for a month to see if this is the cause of current symptoms  Chronic pain . Pharmacist Clinical Goal(s) o Over the next 90 days, patient will work with PharmD and providers to manage pain . Current regimen:  . Hydrocodone-APAP 5-325 mg 1 tablet twice times daily as needed  . Acetaminophen 500 mg 1 tablet twice daily . Interventions: o Discussed maximum recommend amount of Tylenol per day is 3,000 mg . Patient self care activities - Over the next 90 days, patient will: o Continue current medications  Medication management . Pharmacist Clinical Goal(s): o Over the next 90 days, patient will work with PharmD and providers to maintain optimal medication adherence . Current pharmacy: [facility] . Interventions o Comprehensive medication review performed. o Continue current medication management strategy . Patient self care activities - Over the next 90 days, patient will: o Take medications as prescribed o Report any questions or concerns to PharmD and/or provider(s)  Please see past updates related to this goal by clicking on the "Past Updates" button in the selected goal         The patient verbalized understanding of instructions, educational materials, and care plan provided today and declined offer to receive copy of patient instructions, educational materials, and care plan.   Telephone follow up appointment with pharmacy team member scheduled for: 3 months  Viona Gilmore, [facility]

## 2020-12-16 NOTE — Progress Notes (Signed)
Clinical pharmacy sent message when reviewing medications with her, feeling little off asking about checking Lamictal level. I added it on, but she is on such a low dose 25 mg twice daily.

## 2020-12-28 ENCOUNTER — Telehealth: Payer: Self-pay | Admitting: Family Medicine

## 2020-12-28 NOTE — Telephone Encounter (Signed)
Pt is calling wanting to have her bone density results from 09/2020 given to her in detail.  Pt would like to have a call back.

## 2020-12-28 NOTE — Telephone Encounter (Signed)
I called and spoke with pt. We went over her T-score. Pt wanted to know if there are any other supplements that she can take to help her osteoporosis? She did not tolerate the Fosamax or Boniva; she is not interested in Prolia.

## 2020-12-29 NOTE — Telephone Encounter (Signed)
If she does not want to try Prolia and did not tolerate oral medication,she can continue calcium 1200 mg daily, vit D supplementation,fall prevention,and regular physical activity as tolerated. Thanks, BJ

## 2020-12-30 NOTE — Telephone Encounter (Signed)
I spoke with pt. We went over the information below & she verbalized understanding.

## 2021-01-12 ENCOUNTER — Other Ambulatory Visit: Payer: Self-pay

## 2021-01-12 ENCOUNTER — Ambulatory Visit (INDEPENDENT_AMBULATORY_CARE_PROVIDER_SITE_OTHER): Payer: Medicare Other | Admitting: *Deleted

## 2021-01-12 DIAGNOSIS — Z5181 Encounter for therapeutic drug level monitoring: Secondary | ICD-10-CM | POA: Diagnosis not present

## 2021-01-12 DIAGNOSIS — Z86711 Personal history of pulmonary embolism: Secondary | ICD-10-CM | POA: Diagnosis not present

## 2021-01-12 DIAGNOSIS — Z7901 Long term (current) use of anticoagulants: Secondary | ICD-10-CM

## 2021-01-12 LAB — POCT INR: INR: 2.6 (ref 2.0–3.0)

## 2021-01-12 MED ORDER — WARFARIN SODIUM 5 MG PO TABS: ORAL_TABLET | ORAL | 1 refills | Status: DC

## 2021-01-12 NOTE — Patient Instructions (Signed)
Description   Continue taking Warfarin 1 tablet daily. Recheck INR in 6 weeks. Call [facility] [phone] if you have any changes in medications.

## 2021-01-18 ENCOUNTER — Telehealth: Payer: Self-pay | Admitting: Cardiology

## 2021-01-18 NOTE — Telephone Encounter (Signed)
Patient c/o Palpitations:  High priority if patient c/o lightheadedness, shortness of breath, or chest pain  1) How long have you had palpitations/irregular HR/ Afib? Are you having the symptoms now? Has had since February 15th, is having symptoms now.    2) Are you currently experiencing lightheadedness, SOB or CP? SOB, but not severe takes breathe away when heart is pounding. Was not currently having at time of call.   3) Do you have a history of afib (atrial fibrillation) or irregular heart rhythm? Yes   4) Have you checked your BP or HR? (document readings if available): Yes, BP has been okay checks it daily states it has been a little lower the past few days. HR ranging high 70's. HR 65 this morning. States anything above 70 is Afib for her   5) Are you experiencing any other symptoms? Fatigue and weakness.   [name] is calling stating she has been having Afib/ irregular HR since February.  She states she originally believed it was due to a medication she was taking, but has been unable to get it back into rhythm. An appt has been scheduled for Dr. Judeth Cornfield first available on 01/22/21. Please advise.

## 2021-01-18 NOTE — Telephone Encounter (Signed)
Spoke to pt who state for the past few weeks, she feels like she is consistently in afib. She note heart rhythm is irregular but for the most part rate is WNL. Pt report feeling weak and tired and occasional SOB when HR is rapid. HR this morning was 65.   Pt has an appointment scheduled for 3/18. Nurse advised to keep appointment but to report to ER if experience symptoms of feeling light headed or feeling like she's going to black out. Pt verbalized understanding.

## 2021-01-22 ENCOUNTER — Encounter: Payer: Self-pay | Admitting: Cardiology

## 2021-01-22 ENCOUNTER — Ambulatory Visit (INDEPENDENT_AMBULATORY_CARE_PROVIDER_SITE_OTHER): Payer: Medicare Other | Admitting: Cardiology

## 2021-01-22 ENCOUNTER — Other Ambulatory Visit: Payer: Self-pay

## 2021-01-22 VITALS — BP 125/72 | HR 91 | Ht 68.0 in | Wt 226.4 lb

## 2021-01-22 DIAGNOSIS — I480 Paroxysmal atrial fibrillation: Secondary | ICD-10-CM

## 2021-01-22 DIAGNOSIS — R5383 Other fatigue: Secondary | ICD-10-CM

## 2021-01-22 DIAGNOSIS — Z7901 Long term (current) use of anticoagulants: Secondary | ICD-10-CM | POA: Diagnosis not present

## 2021-01-22 DIAGNOSIS — R0602 Shortness of breath: Secondary | ICD-10-CM | POA: Diagnosis not present

## 2021-01-22 MED ORDER — AMIODARONE HCL 200 MG PO TABS: 200.0000 mg | ORAL_TABLET | Freq: Every day | ORAL | 3 refills | Status: DC

## 2021-01-22 MED ORDER — AMIODARONE HCL 200 MG PO TABS
ORAL_TABLET | ORAL | 0 refills | Status: DC
Start: 2021-01-22 — End: 2021-03-26

## 2021-01-22 NOTE — Patient Instructions (Addendum)
Medication Instructions:   Start Amiodarone 200 mg twice a day for 2 weeks, then decrease to 200 mg daily  When your heart rate gets to 60 bpm, drop the diltiazem and continue amiodarone alone.   We will set you up with coumadin clinic follow up as amiodarone can affect the dose of coumadin.  *If you need a refill on your cardiac medications before your next appointment, please call your pharmacy*   Lab Work: None   Testing/Procedures: None   Follow-Up: At [facility], you and your health needs are our priority.  As part of our continuing mission to provide you with exceptional heart care, we have created designated Provider Care Teams.  These Care Teams include your primary Cardiologist (physician) and Advanced Practice Providers (APPs -  Physician Assistants and Nurse Practitioners) who all work together to provide you with the care you need, when you need it.  We recommend signing up for the patient portal called "MyChart".  Sign up information is provided on this After Visit Summary.  MyChart is used to connect with patients for Virtual Visits (Telemedicine).  Patients are able to view lab/test results, encounter notes, upcoming appointments, etc.  Non-urgent messages can be sent to your provider as well.   To learn more about what you can do with MyChart, go to [email or web address].    Your next appointment:   March 05, 2021 @ 3:40 pm  The format for your next appointment:   In Person  Provider:   Buford Dresser, MD  Your physician recommends that you schedule a follow-up appointment with Coumadin clinic within 7-10 days.

## 2021-01-22 NOTE — Progress Notes (Signed)
Cardiology Office Note:    Date:  01/22/2021   ID:  Debbie [name], DOB 08-12-1946, MRN [id number]  PCP:  [name], [name], MD  Cardiologist:  Buford Dresser, MD PhD (previously Dr. Saunders Revel)  Referring MD: [name], [name], MD   CC: follow up  History of Present Illness:    Debbie [name] is a 75 y.o. female with a hx of persistent atrial fibrillation s/p ablation--now paroxysmal, pulmonary embolism (2017), hyperlipidemia, chronic kidney disease stage III, obesity, sleep apnea not on CPAP, fibromyalgia, headaches who is seen in follow up.   She was previously seen by Dr. Saunders Revel on 05/28/2018. At that visit, the plan was to continue anticoagulation, continue diltiazem (both standing and as needed). Recent monitor did not show sustained arrhythmias. She declined treatment for her OSA. I first met her on 09/03/18. Noted that she is very symptomatic with her atrial fibrillation  Today: Here with daughter. She is in atrial fibrillation today at 91 bpm. Feels like she has been in since 2/8 based on her log. Has felt terrible since that time. Felt like it was after she tried fosamax and then [name]. BP have been steady. Has all over pain, feels like she has the flu all the time.   We discussed cardioversion vs. Antiarrhythmic. After shared decision making, she would like to start amiodarone first and defer cardioversion. We did discussed risks of both cardioversion and amiodarone today  Has had severe pain, hasn't been able to do her exercise program.  Feels like her heart pounds, takes her breath away. No LE edema more than usual.   Past Medical History:  Diagnosis Date   Arthritis    Atrial fibrillation (HCC)    Benign essential tremor    Breast cancer (HCC)    Cataract    Bilateral 2018   Chronic renal insufficiency    Common migraine 8/86/7737   Complication of anesthesia    Depression with anxiety    Drowsiness    Excessive daytime   Dyslipidemia    Fibromyalgia    History  of partial seizures    Hypertension    Memory disturbance    Muscle tension headache    Obesity    Obstructive sleep apnea    PONV (postoperative nausea and vomiting)    S/P epidural steroid injection    Sacral contusion 07/2018   Seizures (HCC)    Partial   Sleep apnea    does not wear CPAP   Sprain of neck 06/26/2013   Tremors of nervous system    Vitamin B12 deficiency     Past Surgical History:  Procedure Laterality Date   ABDOMINAL HYSTERECTOMY     ablation procedure     Cardiac, for Afib   BASAL CELL CARCINOMA EXCISION     BASAL CELL CARCINOMA EXCISION     BRAIN MENINGIOMA EXCISION Right    BREAST RECONSTRUCTION Bilateral    breast reconstructive     CATARACT EXTRACTION, BILATERAL     CHOLECYSTECTOMY     DILATION AND CURETTAGE OF UTERUS     dilution     ESOPHAGEAL MANOMETRY N/A 12/14/2016   Procedure: ESOPHAGEAL MANOMETRY (EM);  Surgeon: Mauri Pole, MD;  Location: [facility];  Service: Endoscopy;  Laterality: N/A;   gallbladder resection     LUMBAR SPINE SURGERY N/A    lumbosacral     mastectomies Bilateral    NOSE SURGERY     Basal cell carcinoma resection from the nose   TUBAL LIGATION  N/A     Current Medications: Current Outpatient Medications on File Prior to Visit  Medication Sig   acetaminophen (TYLENOL) 325 MG tablet Take 2 tablets (650 mg total) by mouth every 6 (six) hours. (Patient not taking: Reported on 11/10/2020)   acetaminophen (TYLENOL) 500 MG tablet Take 500 mg by mouth in the morning and at bedtime.   Ascorbic Acid (VITAMIN C PO) Take 282 mg by mouth daily.    calcium carbonate (OS-CAL) 600 MG TABS tablet Take 600 mg by mouth daily with breakfast. With vitamin D 800 units   diltiazem (CARDIZEM CD) 240 MG 24 hr capsule Take 1 capsule (240 mg total) by mouth daily.   diltiazem (CARDIZEM) 30 MG tablet Take 1 tablet (30 mg total) by mouth every 6 (six) hours as needed (Heart rate greater than 100).   gabapentin (NEURONTIN) 600 MG tablet  Take 1 tablet (600 mg total) by mouth 3 (three) times daily.   HYDROcodone-acetaminophen (NORCO/VICODIN) 5-325 MG tablet Take 1 tablet by mouth 3 (three) times daily as needed for moderate pain.    lamoTRIgine (LAMICTAL) 25 MG tablet Take 1 tablet (25 mg total) by mouth 2 (two) times daily.   LEVALBUTEROL HCL IN Inhale into the lungs as needed.    Multiple Vitamins-Minerals (ZINC PO) Take 50 mg by mouth daily.    polyethylene glycol (MIRALAX / GLYCOLAX) packet Take 17 g by mouth as needed.   promethazine (PHENERGAN) 25 MG tablet Take 6.25 mg by mouth every 6 (six) hours as needed for nausea or vomiting.   vitamin B-12 (CYANOCOBALAMIN) 1000 MCG tablet Take 1,000 mcg by mouth daily.   Vitamin D, Ergocalciferol, (DRISDOL) 1.25 MG (50000 UNIT) CAPS capsule TAKE 1 CAPSULE BY MOUTH EVERY 10 DAYS   warfarin (COUMADIN) 5 MG tablet Take 1 tablet daily or as directed by Anticoagulation Clinic.   No current facility-administered medications on file prior to visit.     Allergies:   Diclofenac, Atorvastatin, Tape, Albuterol, Lasix [furosemide], Mushroom extract complex, Tramadol, Buprenorphine hcl, Codeine, Fish oil, and Morphine and related   Social History   Tobacco Use   Smoking status: Never Smoker   Smokeless tobacco: Never Used  Vaping Use   Vaping Use: Never used  Substance Use Topics   Alcohol use: No   Drug use: No    Family History: The patient's family history includes Arthritis in her father and mother; Breast cancer in her sister; Cancer in her father; Cancer - Colon in her father; Colon cancer in her father; Diabetes in her maternal grandfather and mother; Heart disease in her mother; Heart failure in her maternal grandfather and mother; Hyperlipidemia in her mother; Hypertension in her mother; Rectal cancer in her maternal uncle; Stroke in her maternal grandmother and mother. There is no history of Lung disease, Esophageal cancer, or Stomach cancer.  ROS:   Please see the history of  present illness.  Additional pertinent ROS otherwise unremarkable.  EKGs/Labs/Other Studies Reviewed:    The following studies were reviewed today: Cath/PCI: LHC (greater than 10 years ago in [place], [place]): Normal per patient's report.   CV Surgery: None   EP Procedures and Devices: 48-hour Holter monitor (05/03/18): Predominantly sinus rhythm with rare PACs, PVCs, and brief atrial runs lasting up to 4 beats.  No sustained arrhythmia. 30-day event monitor (12/29/16): Predominantly sinus rhythm without significant abnormalities, though quite a bit of artifact was noted. Atrial fibrillation cryoablation (08/17/16, Dr. Minna Merritts, [facility]) Atrial fibrillation radiofrequency ablation (2014)   Non-Invasive  Evaluation(s): TTE (01/12/17): Normal LV size with mild LVH. LVEF 55-60% with grade 1 diastolic dysfunction. Moderate left atrial enlargement. Trivial pericardial effusion. Normal RV size and function. Normal CVP. Pharmacologic myocardial perfusion stress test (12/29/16): Low-risk study with fixed mid and apical anterior/anteroseptal/septal defect most likely representing breast attenuation. Small basal inferolateral and basal anterolateral partially reversible defect that most likely represents shifting breast attenuation, though small area of ischemia cannot be excluded. LVEF 55-65%. Limited TTE (09/13/16): Grossly normal LV size and function. Small pericardial effusion. Limited TTE (09/06/16): Mild to moderate pericardial effusion. Limited TTE (08/18/16): Small pericardial effusion near RV.  EKG:  EKG is personally reviewed today.  The ekg ordered today demonstrates afib at 91 bpm  Recent Labs: 07/29/2020: BUN 12; Creatinine, Ser 0.82; Potassium 4.5; Sodium 143  Recent Lipid Panel No results found for: CHOL, TRIG, HDL, CHOLHDL, VLDL, LDLCALC, LDLDIRECT  Physical Exam:    VS:  BP 125/72   Pulse 91   Ht '5\' 8"'  (1.727 m)   Wt 226 lb 6.4 oz (102.7 kg)   SpO2 95%   BMI 34.42 kg/m      Wt Readings from Last 3 Encounters:  11/10/20 225 lb 3.2 oz (102.2 kg)  09/01/20 223 lb (101.2 kg)  07/06/20 219 lb 12.8 oz (99.7 kg)    GEN: Well nourished, well developed in no acute distress HEENT: Normal, moist mucous membranes NECK: No JVD CARDIAC: irregularly irregular rhythm, normal S1 and S2, no rubs or gallops. No murmur. VASCULAR: Radial and DP pulses 2+ bilaterally. No carotid bruits RESPIRATORY:  Clear to auscultation without rales, wheezing or rhonchi  ABDOMEN: Soft, non-tender, non-distended MUSCULOSKELETAL:  Ambulates independently SKIN: Warm and dry, no significant LE edema NEUROLOGIC:  Alert and oriented x 3. No focal neuro deficits noted. PSYCHIATRIC:  Normal affect   ASSESSMENT:    1. Paroxysmal atrial fibrillation (HCC)   2. Anticoagulant long-term use   3. Other fatigue   4. SOB (shortness of breath)    PLAN:    Paroxysmal atrial fibrillation, on long term anticoagulation: -she is very symptomatic with her atrial fibrillation. In afib today -we discussed cardioversion vs. Antiarrhythmic today. After shared decision making, will start amiodarone. If remains in afib would recommend cardioveresion -declines CPAP for OSA, cannot tolerate -on diltiazem for rate control. Has had issues with bradycardia. Expect a drop in HR on amiodarone. Once HR <60, would stop diltiazem -CHA2DS2/VAS Stroke Risk Points =5, currently on coumadin. Have discussed DOAC in the past, declines. Discussed that amiodarone affects the coumadin dosing, needs to be monitored closely  Shortness of breath, fatigue: -HR improved with decrease to diltiazem 240 mg daily, though in afib today  Fibromyalgia, diffuse pain: -long term concern -working with her pain management team  Hypertension: well controlled -diltiazem dosing as above  Plan for follow up: 03/05/21  Medication Adjustments/Labs and Tests Ordered: Current medicines are reviewed at length with the patient today.  Concerns  regarding medicines are outlined above.  Orders Placed This Encounter  Procedures   EKG 12-Lead   Meds ordered this encounter  Medications   DISCONTD: amiodarone (PACERONE) 200 MG tablet    Sig: Take 1 tablet (200 mg total) by mouth 2 (two) times daily for 14 days, THEN 1 tablet (200 mg total) daily for 14 days.    Dispense:  42 tablet    Refill:  0   DISCONTD: amiodarone (PACERONE) 200 MG tablet    Sig: Take 1 tablet (200 mg total) by mouth daily.  Dispense:  90 tablet    Refill:  3    To start after the 28 day loading dose, separate prescription    Patient Instructions  Medication Instructions:  Start Amiodarone 200 mg twice a day for 2 weeks, then decrease to 200 mg daily When your heart rate gets to 60 bpm, drop the diltiazem and continue amiodarone alone.  We will set you up with coumadin clinic follow up as amiodarone can affect the dose of coumadin.  *If you need a refill on your cardiac medications before your next appointment, please call your pharmacy*   Lab Work: None   Testing/Procedures: None   Follow-Up: At [facility], you and your health needs are our priority.  As part of our continuing mission to provide you with exceptional heart care, we have created designated Provider Care Teams.  These Care Teams include your primary Cardiologist (physician) and Advanced Practice Providers (APPs -  Physician Assistants and Nurse Practitioners) who all work together to provide you with the care you need, when you need it.  We recommend signing up for the patient portal called "MyChart".  Sign up information is provided on this After Visit Summary.  MyChart is used to connect with patients for Virtual Visits (Telemedicine).  Patients are able to view lab/test results, encounter notes, upcoming appointments, etc.  Non-urgent messages can be sent to your provider as well.   To learn more about what you can do with MyChart, go to [email or web address].    Your next  appointment:   March 05, 2021 @ 3:40 pm  The format for your next appointment:   In Person  Provider:   Buford Dresser, MD  Your physician recommends that you schedule a follow-up appointment with Coumadin clinic within 7-10 days.    Signed, Buford Dresser, MD PhD 01/22/2021     [facility]

## 2021-01-29 ENCOUNTER — Other Ambulatory Visit: Payer: Self-pay

## 2021-01-29 ENCOUNTER — Ambulatory Visit (INDEPENDENT_AMBULATORY_CARE_PROVIDER_SITE_OTHER): Payer: Medicare Other

## 2021-01-29 DIAGNOSIS — Z86711 Personal history of pulmonary embolism: Secondary | ICD-10-CM | POA: Diagnosis not present

## 2021-01-29 DIAGNOSIS — R42 Dizziness and giddiness: Secondary | ICD-10-CM

## 2021-01-29 DIAGNOSIS — Z5181 Encounter for therapeutic drug level monitoring: Secondary | ICD-10-CM

## 2021-01-29 DIAGNOSIS — Z7901 Long term (current) use of anticoagulants: Secondary | ICD-10-CM | POA: Diagnosis not present

## 2021-01-29 LAB — POCT INR: INR: 3 (ref 2.0–3.0)

## 2021-01-29 NOTE — Patient Instructions (Signed)
-   have a serving of greens tonight or tomorrow - since you are on amiodarone,  - START NEW DOSAGE of Warfarin 1 tablet daily EXCEPT 1/2 TABLET ON MONDAYS AND FRIDAYS.  - Recheck INR in 10 days Call Coumadin Clinic [phone] if you have any changes in medications.

## 2021-02-08 ENCOUNTER — Ambulatory Visit (INDEPENDENT_AMBULATORY_CARE_PROVIDER_SITE_OTHER): Payer: Medicare Other

## 2021-02-08 ENCOUNTER — Other Ambulatory Visit: Payer: Self-pay

## 2021-02-08 DIAGNOSIS — Z7901 Long term (current) use of anticoagulants: Secondary | ICD-10-CM

## 2021-02-08 DIAGNOSIS — Z5181 Encounter for therapeutic drug level monitoring: Secondary | ICD-10-CM | POA: Diagnosis not present

## 2021-02-08 DIAGNOSIS — Z86711 Personal history of pulmonary embolism: Secondary | ICD-10-CM

## 2021-02-08 LAB — POCT INR: INR: 2.5 (ref 2.0–3.0)

## 2021-02-08 NOTE — Patient Instructions (Signed)
-   continue dosage of Warfarin 1 tablet daily EXCEPT 1/2 TABLET ON MONDAYS AND FRIDAYS.  - Recheck INR in 2 weeks Call Coumadin Clinic [phone] if you have any changes in medications.

## 2021-02-12 ENCOUNTER — Telehealth: Payer: Self-pay | Admitting: Pharmacist

## 2021-02-12 NOTE — Chronic Care Management (AMB) (Signed)
Chronic Care Management Pharmacy Assistant   Name: REMIE MATHISON  MRN: [id number] DOB: 1945-12-23   Reason for Encounter: General Adherence Call     Recent office visits:  None.  Recent consult visits:  02/08/21-Moody, Uvaldo Bristle, RN (Anti-coag visit) 01/29/21-Moody, Uvaldo Bristle, RN (Anti-coag visit) 01/22/21-Christopher, Shawna Orleans, MD ([place])  01/12/21-Hemphill, Myrtis Hopping, RN (Anti-coag visit)  Hospital visits:  None in previous 6 months   Medication changes indicated: 01/22/21-Acetaminophen 500 mg, 2 times daily (drug therapy changed). Amiodarone HCI 200 MG, Take 1 tablet by mouth (2) times daily for 14 days, then 1 tablet daily for 14 days. Diltiazem HCI Coated Beads 240 mg (1) tablet daily. Discontinued: Diltiazem HCI 30 MG.  Medications: Outpatient Encounter Medications as of 02/12/2021  Medication Sig  . acetaminophen (TYLENOL) 500 MG tablet Take 500 mg by mouth in the morning and at bedtime.  Marland Kitchen amiodarone (PACERONE) 200 MG tablet Take 1 tablet (200 mg total) by mouth 2 (two) times daily for 14 days, THEN 1 tablet (200 mg total) daily for 14 days.  Marland Kitchen amiodarone (PACERONE) 200 MG tablet Take 1 tablet (200 mg total) by mouth daily.  . Ascorbic Acid (VITAMIN C PO) Take 282 mg by mouth daily.   . calcium carbonate (OS-CAL) 600 MG TABS tablet Take 600 mg by mouth daily with breakfast. With vitamin D 800 units  . gabapentin (NEURONTIN) 600 MG tablet Take 1 tablet (600 mg total) by mouth 3 (three) times daily.  Marland Kitchen HYDROcodone-acetaminophen (NORCO/VICODIN) 5-325 MG tablet Take 1 tablet by mouth 3 (three) times daily as needed for moderate pain.   Marland Kitchen lamoTRIgine (LAMICTAL) 25 MG tablet Take 1 tablet (25 mg total) by mouth 2 (two) times daily.  Marland Kitchen LEVALBUTEROL HCL IN Inhale into the lungs as needed.   . Multiple Vitamins-Minerals (ZINC PO) Take 50 mg by mouth daily.   . polyethylene glycol (MIRALAX / GLYCOLAX) packet Take 17 g by mouth as needed.  . promethazine (PHENERGAN) 25 MG  tablet Take 6.25 mg by mouth every 6 (six) hours as needed for nausea or vomiting.  . vitamin B-12 (CYANOCOBALAMIN) 1000 MCG tablet Take 1,000 mcg by mouth daily.  . Vitamin D, Ergocalciferol, (DRISDOL) 1.25 MG (50000 UNIT) CAPS capsule TAKE 1 CAPSULE BY MOUTH EVERY 10 DAYS  . warfarin (COUMADIN) 5 MG tablet Take 1 tablet daily or as directed by Anticoagulation Clinic.   No facility-administered encounter medications on file as of 02/12/2021.    Star Rating Drugs:  None.  Note: Patient voiced she is in Afib and her pulse has been elevating in the 90s. She was last seen by Cardiologist (Dr. Harrell Gave) on 01/22/21 for her dx and was prescribed Amiodarone 200 mg twice a day for 2 weeks, then decrease to 200 mg daily, and Diltiazem HCI Coated beads 240 mg (1) capsule daily. Cardiologist placed orders when patient heart rate gets to 60 bpm, drop diltiazem HCI and continue amiodarone alone. Patient said her pulse has been irregular and has shortness of breath, the Afib is making her feel tired and weak. Patient is checking her pulse twice a day, today it was 83 this morning at 10:00 AM after taking medications at 8:30 AM. She also said when her pulse gets to 70 she goes into Afib. She has a follow up appointment with the cardiologist 03/05/21 to see if drug therapy has helped, if not cardiologist will coordinate a different plan. Patient voiced she is doing well however she will contact her cardiologist if symptoms  are worsening and schedule to be seen sooner than her appointment.   Patient aware of CCM Call follow up with Jeni Salles, CPP on 03/08/21 at 11:00 AM.   Jeni Salles, CPP Notified.   Raynelle Highland, [place] Pharmacist Assistant [phone] CCM Total Time: 44 minutes

## 2021-02-24 ENCOUNTER — Other Ambulatory Visit: Payer: Self-pay

## 2021-02-24 ENCOUNTER — Ambulatory Visit (INDEPENDENT_AMBULATORY_CARE_PROVIDER_SITE_OTHER): Payer: Medicare Other | Admitting: *Deleted

## 2021-02-24 DIAGNOSIS — Z5181 Encounter for therapeutic drug level monitoring: Secondary | ICD-10-CM

## 2021-02-24 DIAGNOSIS — Z7901 Long term (current) use of anticoagulants: Secondary | ICD-10-CM

## 2021-02-24 DIAGNOSIS — Z86711 Personal history of pulmonary embolism: Secondary | ICD-10-CM

## 2021-02-24 LAB — POCT INR: INR: 2.1 (ref 2.0–3.0)

## 2021-02-24 NOTE — Patient Instructions (Addendum)
  Description   - continue dosage of Warfarin 1 tablet daily EXCEPT 1/2 TABLET ON MONDAYS AND FRIDAYS.  - Recheck INR in 3 weeks Call Coumadin Clinic [phone] if you have any changes in medications.

## 2021-03-05 ENCOUNTER — Telehealth: Payer: Self-pay | Admitting: Pharmacist

## 2021-03-05 ENCOUNTER — Ambulatory Visit: Payer: Medicare Other | Admitting: Cardiology

## 2021-03-05 NOTE — Chronic Care Management (AMB) (Signed)
I spoke with the patient about her upcoming appointment on 03/08/2021 @ 11:00 am with the clinical pharmacist. She was asked to please have all medication on hand to review with the pharmacist. She confirmed the appointment.  [name], [place] [phone]

## 2021-03-08 ENCOUNTER — Ambulatory Visit (INDEPENDENT_AMBULATORY_CARE_PROVIDER_SITE_OTHER): Payer: Medicare Other | Admitting: Pharmacist

## 2021-03-08 DIAGNOSIS — I4891 Unspecified atrial fibrillation: Secondary | ICD-10-CM

## 2021-03-08 DIAGNOSIS — F411 Generalized anxiety disorder: Secondary | ICD-10-CM

## 2021-03-08 DIAGNOSIS — I10 Essential (primary) hypertension: Secondary | ICD-10-CM

## 2021-03-08 NOTE — Progress Notes (Signed)
Chronic Care Management Pharmacy Note  03/09/2021 Name:  Debbie [name] MRN:  [id number] DOB:  Mar 30, 1946  Subjective: Debbie [name] is an 75 y.o. year old female who is a primary patient of [name], Malka So, MD.  The CCM team was consulted for assistance with disease management and care coordination needs.    Engaged with patient by telephone for follow up visit in response to provider referral for pharmacy case management and/or care coordination services.   Consent to Services:  The patient was given information about Chronic Care Management services, agreed to services, and gave verbal consent prior to initiation of services.  Please see initial visit note for detailed documentation.   Patient Care Team: [name], [name], MD as PCP - General (Family Medicine) Buford Dresser, MD as PCP - Cardiology (Cardiology) Loni Beckwith Neita Goodnight. (Inactive) as Consulting Physician (Cardiology) Biagio Quint, MD as Referring Physician (Nephrology) Zonia Kief, MD as Consulting Physician (Rehabilitation) Jamse Arn, MD as Consulting Physician (Physical Medicine and Rehabilitation) [name], NP as Nurse Practitioner (Nurse Practitioner) Viona Gilmore, [facility] as Pharmacist (Pharmacist)  Recent office visits: 06/17/20 Charlott Rakes, LPN: Patient presented for Medicare annual wellness exam.   11/15/19 Betty [name], MD: Patient presented for video visit for COVID-19 infection and Afib and HTN follow up. Patient's BP is mildly elevated today and has a headache. Continue Symbicort 2 puffs BID and albuterol PRN. Continued BP medications.   Recent consult visits: 02/24/21 Johny Shock, RN (cardiology): Patient presented for anti-coag visit. INR 2.1, goal 2-3. Continued 2.5 mg (5 mg x 0.5) every Mon, Fri; 5 mg (5 mg x 1) all other days.  02/12/21 Dede Query, RN (cardiology): Patient presented for anti-coag visit. INR 2.5, goal 2-3. Continued 2.5 mg (5 mg x 0.5) every  Mon, Fri; 5 mg (5 mg x 1) all other days.  01/22/21 Buford Dresser, MD (cardiology): Patient presented for Afib follow up. Start Amiodarone 200 mg twice a day for 2 weeks, then decrease to 200 mg daily When your heart rate gets to 60 bpm, drop the diltiazem and continue amiodarone alone.  11/10/20 [name], NP (neurology): Patient presented for tremor follow up.   09/07/20 [name], NP: Patient presented for pain management visit. Unable to access notes.  Hospital visits: None in previous 6 months  Objective:  Lab Results  Component Value Date   CREATININE 0.82 07/29/2020   BUN 12 07/29/2020   GFR 73.20 11/29/2016   GFRNONAA 71 07/29/2020   GFRAA 82 07/29/2020   NA 143 07/29/2020   K 4.5 07/29/2020   CALCIUM 9.3 07/29/2020   CO2 26 07/29/2020   GLUCOSE 94 07/29/2020    Lab Results  Component Value Date/Time   GFR 73.20 11/29/2016 02:37 PM   GFR 61.77 11/16/2016 09:42 AM    Last diabetic Eye exam: No results found for: HMDIABEYEEXA  Last diabetic Foot exam: No results found for: HMDIABFOOTEX   No results found for: CHOL, HDL, LDLCALC, LDLDIRECT, TRIG, CHOLHDL  Hepatic Function Latest Ref Rng & Units 07/12/2018 07/11/2018 04/19/2018  Total Protein 6.5 - 8.1 g/dL 6.7 7.1 6.8  Albumin 3.5 - 5.0 g/dL 3.4(L) 3.7 4.2  AST 15 - 41 U/L 30 38 35  ALT 0 - 44 U/L 19 21 33(H)  Alk Phosphatase 38 - 126 U/L 49 57 63  Total Bilirubin 0.3 - 1.2 mg/dL 1.2 0.9 0.5    Lab Results  Component Value Date/Time   TSH 1.580 10/29/2019 02:53 PM  TSH 1.867 03/04/2019 04:19 PM   TSH 1.400 04/19/2018 12:51 PM    CBC Latest Ref Rng & Units 03/05/2019 03/04/2019 07/12/2018  WBC 4.0 - 10.5 K/uL 9.6 11.5(H) 4.2  Hemoglobin 12.0 - 15.0 g/dL 14.5 16.4(H) 13.0  Hematocrit 36.0 - 46.0 % 44.7 50.3(H) 41.3  Platelets 150 - 400 K/uL 156 207 143(L)    Lab Results  Component Value Date/Time   VD25OH 45.75 09/12/2018 02:46 PM   VD25OH 53.96 11/29/2016 02:37 PM    Clinical ASCVD: No   The ASCVD Risk score Mikey Bussing DC Jr., et al., 2013) failed to calculate for the following reasons:   Cannot find a previous HDL lab   Cannot find a previous total cholesterol lab    Depression screen [facility] 2/9 06/17/2020 06/11/2019 06/13/2018  Decreased Interest 1 0 0  Down, Depressed, Hopeless 1 1 0  PHQ - 2 Score 2 1 0  Altered sleeping 0 - -  Tired, decreased energy 1 - -  Change in appetite 1 - -  Feeling bad or failure about yourself  0 - -  Trouble concentrating 1 - -  Moving slowly or fidgety/restless 0 - -  Suicidal thoughts 0 - -  PHQ-9 Score 5 - -  Difficult doing work/chores - - -  Some recent data might be hidden     CHA2DS2/VAS Stroke Risk Points  Current as of 2 minutes ago     5 >= 2 Points: High Risk  1 - 1.99 Points: Medium Risk  0 Points: Low Risk    Last Change: N/A      Details    This score determines the patient's risk of having a stroke if the  patient has atrial fibrillation.       Points Metrics  0 Has Congestive Heart Failure:  No    Current as of 2 minutes ago  0 Has Vascular Disease:  No    Current as of 2 minutes ago  1 Has Hypertension:  Yes    Current as of 2 minutes ago  1 Age:  79    Current as of 2 minutes ago  0 Has Diabetes:  No    Current as of 2 minutes ago  2 Had Stroke:  No  Had TIA:  No  Had Thromboembolism:  Yes    Current as of 2 minutes ago  1 Female:  Yes    Current as of 2 minutes ago     Social History   Tobacco Use  Smoking Status Never Smoker  Smokeless Tobacco Never Used   BP Readings from Last 3 Encounters:  01/22/21 125/72  11/10/20 (!) 108/59  09/01/20 122/72   Pulse Readings from Last 3 Encounters:  01/22/21 91  11/10/20 (!) 52  09/01/20 64   Wt Readings from Last 3 Encounters:  01/22/21 226 lb 6.4 oz (102.7 kg)  11/10/20 225 lb 3.2 oz (102.2 kg)  09/01/20 223 lb (101.2 kg)   BMI Readings from Last 3 Encounters:  01/22/21 34.42 kg/m  11/10/20 34.24 kg/m  09/01/20 32.00 kg/m     Assessment/Interventions: Review of patient past medical history, allergies, medications, health status, including review of consultants reports, laboratory and other test data, was performed as part of comprehensive evaluation and provision of chronic care management services.   SDOH:  (Social Determinants of Health) assessments and interventions performed: No  SDOH Screenings   Alcohol Screen: Not on file  Depression (PHQ2-9): Medium Risk  . PHQ-2 Score: 5  Financial  Resource Strain: Low Risk   . Difficulty of Paying Living Expenses: Not hard at all  Food Insecurity: No Food Insecurity  . Worried About Charity fundraiser in the Last Year: Never true  . Ran Out of Food in the Last Year: Never true  Housing: Low Risk   . Last Housing Risk Score: 0  Physical Activity: Inactive  . Days of Exercise per Week: 0 days  . Minutes of Exercise per Session: 0 min  Social Connections: Moderately Isolated  . Frequency of Communication with Friends and Family: More than three times a week  . Frequency of Social Gatherings with Friends and Family: Twice a week  . Attends Religious Services: Never  . Active Member of Clubs or Organizations: No  . Attends Archivist Meetings: Never  . Marital Status: Married  Stress: Stress Concern Present  . Feeling of Stress : To some extent  Tobacco Use: Low Risk   . Smoking Tobacco Use: Never Smoker  . Smokeless Tobacco Use: Never Used  Transportation Needs: No Transportation Needs  . Lack of Transportation (Medical): No  . Lack of Transportation (Non-Medical): No    CCM Care Plan  Allergies  Allergen Reactions  . Diclofenac Shortness Of Breath  . Atorvastatin Other (See Comments)    Muscle pain  . Tape Other (See Comments)    Peels skin off (bandaids, too) unknown  . Albuterol Other (See Comments)    Afib  . Lasix [Furosemide]     Dizzy nausea  . Mushroom Extract Complex Nausea And Vomiting  . Tramadol Other (See Comments)  and Nausea And Vomiting  . Buprenorphine Hcl Nausea And Vomiting  . Codeine Nausea And Vomiting  . Fish Oil Nausea And Vomiting  . Morphine And Related Nausea And Vomiting    Medications Reviewed Today    Reviewed by Buford Dresser, MD (Physician) on 01/22/21 at [phone]  Med List Status: <None>  Medication Order Taking? Sig Documenting Provider Last Dose Status Informant  acetaminophen (TYLENOL) 500 MG tablet [id number] Yes Take 500 mg by mouth in the morning and at bedtime. [provider] Taking Active   Ascorbic Acid (VITAMIN C PO) [id number] Yes Take 282 mg by mouth daily.  [provider] Taking Active   calcium carbonate (OS-CAL) 600 MG TABS tablet [id number] Yes Take 600 mg by mouth daily with breakfast. With vitamin D 800 units [provider] Taking Active Self  diltiazem (CARDIZEM CD) 240 MG 24 hr capsule [id number] Yes Take 1 capsule (240 mg total) by mouth daily. Buford Dresser, MD Taking Active   diltiazem (CARDIZEM) 30 MG tablet [id number] Yes Take 1 tablet (30 mg total) by mouth every 6 (six) hours as needed (Heart rate greater than 100). Buford Dresser, MD Taking Active   gabapentin (NEURONTIN) 600 MG tablet [id number] Yes Take 1 tablet (600 mg total) by mouth 3 (three) times daily. Kathrynn Ducking, MD Taking Active Self           Med Note Constance Haw, Tommy Rainwater   Fri Jan 22, 2021  8:12 AM)    HYDROcodone-acetaminophen (NORCO/VICODIN) 5-325 MG tablet [id number] Yes Take 1 tablet by mouth 3 (three) times daily as needed for moderate pain.  [provider] Taking Active Self           Med Note Constance Haw, JACQUELINE L   Fri Jan 22, 2021  8:11 AM)    lamoTRIgine (LAMICTAL) 25 MG tablet [id number] Yes Take 1 tablet (25 mg total) by mouth  2 (two) times daily. Suzzanne Cloud, NP Taking Active   LEVALBUTEROL HCL IN [id number] Yes Inhale into the lungs as needed.  [provider] Taking Active   Multiple Vitamins-Minerals (ZINC  PO) [id number] Yes Take 50 mg by mouth daily.  [provider] Taking Active   polyethylene glycol [facility] / GLYCOLAX) packet [id number] Yes Take 17 g by mouth as needed. [provider] Taking Active Self  promethazine (PHENERGAN) 25 MG tablet [id number] Yes Take 6.25 mg by mouth every 6 (six) hours as needed for nausea or vomiting. [provider] Taking Active   vitamin B-12 (CYANOCOBALAMIN) 1000 MCG tablet [id number] Yes Take 1,000 mcg by mouth daily. [provider] Taking Active   Vitamin D, Ergocalciferol, (DRISDOL) 1.25 MG (50000 UNIT) CAPS capsule [id number] Yes TAKE 1 CAPSULE BY MOUTH EVERY 10 DAYS [name], [name], MD Taking Active   warfarin (COUMADIN) 5 MG tablet [id number] Yes Take 1 tablet daily or as directed by Anticoagulation Clinic. Buford Dresser, MD Taking Active           Patient Active Problem List   Diagnosis Date Noted  . Dizziness 11/10/2020  . Hearing loss 07/06/2020  . Atrial fibrillation with RVR ([place]) 03/04/2019  . Elevated troponin 03/04/2019  . History of partial seizures 03/04/2019  . Vitamin D deficiency, unspecified 09/12/2018  . Sacral fracture, closed ([place]) 07/11/2018  . Fall   . Pain   . Palpitations 04/20/2018  . Encounter for therapeutic drug monitoring 10/24/2017  . History of pulmonary embolism 10/20/2017  . Chronic lower back pain 09/08/2017  . Arthritis 06/19/2017  . Chest tightness or pressure 06/19/2017  . Claustrophobia 06/19/2017  . Diverticulitis 06/19/2017  . Headache 06/19/2017  . Heartburn 06/19/2017  . PONV (postoperative nausea and vomiting) 06/19/2017  . Nausea without vomiting 05/08/2017  . Generalized anxiety disorder 03/07/2017  . Varicose veins of both lower extremities 02/11/2017  . Atypical chest pain 02/11/2017  . Constipation 01/06/2017  . Dysphagia   . Restrictive lung disease 12/16/2016  . Generalized osteoarthritis of multiple sites 12/09/2016  . Dyspnea 11/18/2016  .  Chronic pulmonary embolism ([place]) 09/06/2016  . Other pulmonary embolism without acute cor pulmonale ([place]) 09/05/2016  . Pericardial effusion 08/29/2016  . S/P ablation of atrial fibrillation 04/13/2016  . Chronic arthralgias of knees and hips 02/26/2016  . Thoracic radiculopathy 02/26/2016  . Anxiety 01/29/2016  . Depression 01/29/2016  . Esophageal reflux 01/29/2016  . Osteoarthritis 01/29/2016  . Osteopenia 01/29/2016  . Other intervertebral disc displacement, lumbar region 01/29/2016  . Tremor 01/29/2016  . 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 Spondylolisthesis of lumbar region 01/29/2016  . Seizure, partial ([place]) 01/29/2016  . Pure hypertriglyceridemia 01/29/2016  . Osteoarthritis of right foot 01/18/2016  . Fibromyalgia 11/13/2015  . Anticoagulant long-term use 10/28/2015  . Benign hypertension with CKD (chronic kidney disease), stage II 10/28/2015  . Mixed hyperlipidemia 10/28/2015  . Need for influenza vaccination 10/28/2015  . Recurrent major depressive disorder, in partial remission ([place]) 10/28/2015  . Chronic kidney disease, stage 3 ([place]) 07/29/2015  . Essential hypertension 05/12/2015  . Fatigue 05/12/2015  . Long term current use of anticoagulant 05/12/2015  . Common migraine 05/07/2015  . Sprain of neck 06/26/2013  . Sleep apnea 02/04/2013  . Paroxysmal atrial fibrillation ([place]) 02/04/2013  . Other vitamin B12 deficiency anemia 02/04/2013  . Memory loss 02/04/2013  . Chronic back pain 02/04/2013  . History of seizure disorder 11/08/2011  . Cancer ([place]) 11/07/1982  Immunization History  Administered Date(s) Administered  . Influenza, High Dose Seasonal PF 10/28/2015, 12/09/2016, 09/08/2017, 09/12/2018  . Influenza-Unspecified 08/28/2012, 08/20/2013  . Pneumococcal Conjugate-13 09/12/2018  . Pneumococcal Polysaccharide-23 02/15/2012, 09/08/2017  . Td 02/15/2007    Conditions to be addressed/monitored:   Hypertension, Atrial Fibrillation, GERD, Osteopenia and Headaches, Seizures, Fibromyalgia, Chronic Pain and Shortness of Breath  Care Plan : [place]  Updates made by Viona Gilmore, [place] since 03/09/2021 12:00 AM    Problem: Problem: Hypertension, Atrial Fibrillation, GERD, Osteopenia and Headaches, Seizures, Fibromyalgia, Chronic Pain and Shortness of Breath     Long-Range Goal: Patient-Specific Goal   Start Date: 03/08/2021  Expected End Date: 03/08/2022  This Visit's Progress: On track  Priority: High  Note:   Current Barriers:  . Unable to independently monitor therapeutic efficacy . Unable to achieve control of Afib   Pharmacist Clinical Goal(s):  Marland Kitchen Patient will achieve adherence to monitoring guidelines and medication adherence to achieve therapeutic efficacy . achieve control of heart rate as evidenced by home monitoring  through collaboration with PharmD and provider.   Interventions: . 1:1 collaboration with [name], [name], MD regarding development and update of comprehensive plan of care as evidenced by provider attestation and co-signature . Inter-disciplinary care team collaboration (see longitudinal plan of care) . Comprehensive medication review performed; medication list updated in electronic medical record  Hypertension (BP goal <140/90) -Controlled -Current treatment: . No medications -Medications previously tried: diltiazem (no longer needed)  -Current home readings: not checking consistently -Current dietary habits: limiting salt intake -Current exercise habits: limited -Reports hypotensive/hypertensive symptoms -Educated on Importance of home blood pressure monitoring; Proper BP monitoring technique; -Counseled to monitor BP at home weekly, document, and provide log at future appointments -Counseled on diet and exercise extensively  Atrial Fibrillation (Goal: prevent stroke and major bleeding) -Uncontrolled -CHADSVASC: 4 -Current  treatment:  Rate/rhythm control: amiodarone 200 mg twice Diltiazem 30 mg 1 tablet every 6 hours PRN for HR > 100 . Anticoagulation: warfarin 5 mg tablets as directed by coumadin clinic -Medications previously tried: diltiazem (no longer needed) -Home BP and HR readings: 120s-135s, 131, 139, 138, highest 160/87 (felt bad), 50s, sometimes 49, 52 (feeling weak), 121/71 pulse 63, 142/70, 118/65, 114/61 -Counseled on importance of adherence to anticoagulant exactly as prescribed; avoidance of NSAIDs due to increased bleeding risk with anticoagulants; -Counseled on diet and exercise extensively Recommended to continue current medication Collaborated with cardiologist about decreasing dose of amiodarone  Osteopenia (Goal prevent fractures) -Uncontrolled -Last DEXA Scan: 09/2020             T-Score femoral neck: -2.3             T-Score total hip: n/a             T-Score lumbar spine: n/a             T-Score forearm radius: -0.2             10-year probability of major osteoporotic fracture: 13.7%             10-year probability of hip fracture: 3.7% -Patient is a candidate for pharmacologic treatment due to T-Score -1.0 to -2.5 and 10-year risk of hip fracture > 3% -Current treatment   Vitamin D 50,000 units 1 capsule every 10 days   Calcium carbonate 600 mg & vitamin D3 1 tablet daily with breakfast -Medications previously tried: Ibandronate, Alendronate  (side effects) -Recommend 3078152889 units of vitamin D daily. Recommend 1200  mg of calcium daily from dietary and supplemental sources. Recommend weight-bearing and muscle strengthening exercises for building and maintaining bone density. -Counseled on diet and exercise extensively Recommended repeat vitamin D level  Headaches (Goal: minimize pain with headaches) -Not ideally controlled -Current treatment   Gabapentin 600 mg 1 tablet twice daily  Lamictal 25 mg 1 tablet twice daily -Medications previously tried: n/a  -Recommended to  continue current medication  Seizures (Goal: prevent seizures) -Controlled -Current treatment  . Lamictal 25 mg 1 tablet twice daily -Medications previously tried: n/a  -Recommended for patient to complete lab work for [employer] level  GERD (Goal: minimize symptoms) -Controlled -Current treatment  . Tums as needed -Medications previously tried: none  -Recommended to continue current medication Counseled on non-pharmacologic management of symptoms such as elevating the head of your bed, avoiding eating 2-3 hours before bed, avoiding triggering foods such as acidic, spicy, or fatty foods, eating smaller meals, and wearing clothes that are loose around the waist; can continue to use Tums PRN when she eats too much   Fibromyalgia (Goal: minimize pain) -Controlled -Current treatment  . Gabapentin 600 mg 1 tablet twice daily -Medications previously tried: n/a  -Recommended to continue current medication  Chronic pain (Goal: minimize pain) -Not ideally controlled -Current treatment   Hydrocodone-APAP 5-325 mg 1 tablet twice times daily as needed   Acetaminophen 500 mg 1 tablet twice daily -Medications previously tried: none  -Counseled on avoiding NSAIDs and maximum recommended dose of Tylenol is 3000 mg/day Recommended limiting use of Hydrocodone  Allergic rhinitis (Goal: minimize symptoms) -Uncontrolled -Current treatment  . No medications -Medications previously tried: claritin   -Recommended trial of Claritin or Zyrtec for allergies   Health Maintenance -Vaccine gaps: COVID, tetanus, shingles -Current therapy:   Zinc 50 mg 1 tablet daily  Vitamin C 282 mg 1 gummy daily  Vitamin B12 1000 mcg daily  Miralax daily 1/2 capful -Educated on Cost vs benefit of each product must be carefully weighed by individual consumer -Patient is satisfied with current therapy and denies issues -Recommended to continue current medication  Patient Goals/Self-Care Activities . Patient  will:  - take medications as prescribed check blood pressure weekly, document, and provide at future appointments  Follow Up Plan: Telephone follow up appointment with care management team member scheduled for: 3 months       Medication Assistance: None required.  Patient affirms current coverage meets needs.  Patient's preferred pharmacy is:  [facility] [address], [place] [address] Phone: [phone] Fax: [phone]  Uses pill box? Yes Pt endorses 90% compliance  We discussed: Current pharmacy is preferred with insurance plan and patient is satisfied with pharmacy services Patient decided to: Continue current medication management strategy  Care Plan and Follow Up Patient Decision:  Patient agrees to Care Plan and Follow-up.  Plan: Telephone follow up appointment with care management team member scheduled for:  3 months  Jeni Salles, PharmD [place] Pharmacist [place] at [place] [phone]

## 2021-03-10 ENCOUNTER — Telehealth: Payer: Medicare Other | Admitting: Cardiology

## 2021-03-17 ENCOUNTER — Other Ambulatory Visit: Payer: Self-pay

## 2021-03-17 ENCOUNTER — Ambulatory Visit (INDEPENDENT_AMBULATORY_CARE_PROVIDER_SITE_OTHER): Payer: Medicare Other

## 2021-03-17 DIAGNOSIS — I2782 Chronic pulmonary embolism: Secondary | ICD-10-CM | POA: Diagnosis not present

## 2021-03-17 DIAGNOSIS — Z86711 Personal history of pulmonary embolism: Secondary | ICD-10-CM | POA: Diagnosis not present

## 2021-03-17 DIAGNOSIS — Z7901 Long term (current) use of anticoagulants: Secondary | ICD-10-CM | POA: Diagnosis not present

## 2021-03-17 DIAGNOSIS — Z5181 Encounter for therapeutic drug level monitoring: Secondary | ICD-10-CM

## 2021-03-17 LAB — POCT INR: INR: 2.3 (ref 2.0–3.0)

## 2021-03-17 NOTE — Patient Instructions (Signed)
Description   Continue on same dosage of Warfarin 1 tablet daily except 1/2 tablet on Mondays and Fridays. Recheck INR in 4 weeks.  Call Coumadin Clinic [phone] if you have any changes in medications.

## 2021-03-23 ENCOUNTER — Telehealth: Payer: Medicare Other | Admitting: Cardiology

## 2021-03-26 ENCOUNTER — Telehealth (INDEPENDENT_AMBULATORY_CARE_PROVIDER_SITE_OTHER): Payer: Medicare Other | Admitting: Cardiology

## 2021-03-26 ENCOUNTER — Encounter: Payer: Self-pay | Admitting: Cardiology

## 2021-03-26 VITALS — BP 120/63 | HR 54 | Ht 69.0 in | Wt 223.0 lb

## 2021-03-26 DIAGNOSIS — R001 Bradycardia, unspecified: Secondary | ICD-10-CM

## 2021-03-26 DIAGNOSIS — I480 Paroxysmal atrial fibrillation: Secondary | ICD-10-CM

## 2021-03-26 DIAGNOSIS — R5383 Other fatigue: Secondary | ICD-10-CM | POA: Diagnosis not present

## 2021-03-26 DIAGNOSIS — Z7901 Long term (current) use of anticoagulants: Secondary | ICD-10-CM

## 2021-03-26 MED ORDER — AMIODARONE HCL 100 MG PO TABS: 100.0000 mg | ORAL_TABLET | Freq: Every day | ORAL | 3 refills | Status: DC

## 2021-03-26 NOTE — Patient Instructions (Signed)
Medication Instructions:   Decrease Amiodarone to 100 mg daily   *If you need a refill on your cardiac medications before your next appointment, please call your pharmacy*   Lab Work: None   Testing/Procedures: None   Follow-Up: At [facility], you and your health needs are our priority.  As part of our continuing mission to provide you with exceptional heart care, we have created designated Provider Care Teams.  These Care Teams include your primary Cardiologist (physician) and Advanced Practice Providers (APPs -  Physician Assistants and Nurse Practitioners) who all work together to provide you with the care you need, when you need it.  We recommend signing up for the patient portal called "MyChart".  Sign up information is provided on this After Visit Summary.  MyChart is used to connect with patients for Virtual Visits (Telemedicine).  Patients are able to view lab/test results, encounter notes, upcoming appointments, etc.  Non-urgent messages can be sent to your provider as well.   To learn more about what you can do with MyChart, go to [email or web address].    Your next appointment:   3 month(s) @ [address] [place], [place] [address]   The format for your next appointment:   In Person  Provider:   Buford Dresser, MD

## 2021-03-26 NOTE — Progress Notes (Signed)
Virtual Visit via Telephone Note   This visit type was conducted due to national recommendations for restrictions regarding the COVID-19 Pandemic (e.g. social distancing) in an effort to limit this patient's exposure and mitigate transmission in our community.  Due to her co-morbid illnesses, this patient is at least at moderate risk for complications without adequate follow up.  This format is felt to be most appropriate for this patient at this time.  The patient did not have access to video technology/had technical difficulties with video requiring transitioning to audio format only (telephone).  All issues noted in this document were discussed and addressed.  No physical exam could be performed with this format.  Please refer to the patient's chart for her  consent to telehealth for [facility].   The patient was identified using 2 identifiers.  Patient Location: Home Provider Location: Home Office  CC: follow up  History of Present Illness:    Debbie [name] is a 75 y.o. female with a hx of persistent atrial fibrillation s/p ablation--now paroxysmal, pulmonary embolism (2017), hyperlipidemia, chronic kidney disease stage III, obesity, sleep apnea not on CPAP, fibromyalgia, headaches who is seen in follow up.   She was previously seen by Dr. Saunders Revel on 05/28/2018. At that visit, the plan was to continue anticoagulation, continue diltiazem (both standing and as needed). Recent monitor did not show sustained arrhythmias. She declined treatment for her OSA. I first met her on 09/03/18. Noted that she is very symptomatic with her atrial fibrillation  Today: Doing ok overall. Tolerating amiodarone, but pulse has been running lower. No afib in over a month. Energy has been better. Asking if there is an option to lower amiodarone dose, as she is on no other rate affecting agents.  Denies chest pain, shortness of breath at rest or with normal exertion. No PND, orthopnea, LE edema or unexpected  weight gain. No syncope or palpitations.  Past Medical History:  Diagnosis Date  . Arthritis   . Atrial fibrillation ([place])   . Benign essential tremor   . Breast cancer ([place])   . Cataract    Bilateral 2018  . Chronic renal insufficiency   . Common migraine 05/07/2015  . Complication of anesthesia   . Depression with anxiety   . Drowsiness    Excessive daytime  . Dyslipidemia   . Fibromyalgia   . History of partial seizures   . Hypertension   . Memory disturbance   . Muscle tension headache   . Obesity   . Obstructive sleep apnea   . PONV (postoperative nausea and vomiting)   . S/P epidural steroid injection   . Sacral contusion 07/2018  . Seizures ([facility])    Partial  . Sleep apnea    does not wear CPAP  . Sprain of neck 06/26/2013  . Tremors of nervous system   . Vitamin B12 deficiency     Past Surgical History:  Procedure Laterality Date  . ABDOMINAL HYSTERECTOMY    . ablation procedure     Cardiac, for Afib  . BASAL CELL CARCINOMA EXCISION    . BASAL CELL CARCINOMA EXCISION    . BRAIN MENINGIOMA EXCISION Right   . BREAST RECONSTRUCTION Bilateral   . breast reconstructive    . CATARACT EXTRACTION, BILATERAL    . CHOLECYSTECTOMY    . DILATION AND CURETTAGE OF UTERUS    . dilution    . ESOPHAGEAL MANOMETRY N/A 12/14/2016   Procedure: ESOPHAGEAL MANOMETRY (EM);  Surgeon: Mauri Pole, MD;  Location: [facility];  Service: Endoscopy;  Laterality: N/A;  . gallbladder resection    . LUMBAR SPINE SURGERY N/A   . lumbosacral    . mastectomies Bilateral   . NOSE SURGERY     Basal cell carcinoma resection from the nose  . TUBAL LIGATION N/A     Current Medications: Current Outpatient Medications on File Prior to Visit  Medication Sig  . acetaminophen (TYLENOL) 500 MG tablet Take 500 mg by mouth in the morning and at bedtime.  Marland Kitchen amiodarone (PACERONE) 200 MG tablet Take 1 tablet (200 mg total) by mouth daily.  . Ascorbic Acid (VITAMIN C PO) Take 282 mg by mouth  daily.   . calcium carbonate (OS-CAL) 600 MG TABS tablet Take 600 mg by mouth daily with breakfast. With vitamin D 800 units  . gabapentin (NEURONTIN) 600 MG tablet Take 600 mg by mouth 2 (two) times daily.  Marland Kitchen HYDROcodone-acetaminophen (NORCO/VICODIN) 5-325 MG tablet Take 1 tablet by mouth 3 (three) times daily as needed for moderate pain.   Marland Kitchen lamoTRIgine (LAMICTAL) 25 MG tablet Take 1 tablet (25 mg total) by mouth 2 (two) times daily.  Marland Kitchen LEVALBUTEROL HCL IN Inhale into the lungs as needed.   . Multiple Vitamins-Minerals (ZINC PO) Take 50 mg by mouth daily.   . polyethylene glycol (MIRALAX / GLYCOLAX) packet Take 17 g by mouth as needed.  . promethazine (PHENERGAN) 25 MG tablet Take 6.25 mg by mouth every 6 (six) hours as needed for nausea or vomiting.  . vitamin B-12 (CYANOCOBALAMIN) 1000 MCG tablet Take 1,000 mcg by mouth daily.  . Vitamin D, Ergocalciferol, (DRISDOL) 1.25 MG (50000 UNIT) CAPS capsule TAKE 1 CAPSULE BY MOUTH EVERY 10 DAYS  . warfarin (COUMADIN) 5 MG tablet Take 1 tablet daily or as directed by Anticoagulation Clinic.   No current facility-administered medications on file prior to visit.     Allergies:   Diclofenac, Atorvastatin, Tape, Albuterol, Lasix [furosemide], Mushroom extract complex, Tramadol, Buprenorphine hcl, Codeine, Fish oil, and Morphine and related   Social History   Tobacco Use  . Smoking status: Never Smoker  . Smokeless tobacco: Never Used  Vaping Use  . Vaping Use: Never used  Substance Use Topics  . Alcohol use: No  . Drug use: No    Family History: The patient's family history includes Arthritis in her father and mother; Breast cancer in her sister; Cancer in her father; Cancer - Colon in her father; Colon cancer in her father; Diabetes in her maternal grandfather and mother; Heart disease in her mother; Heart failure in her maternal grandfather and mother; Hyperlipidemia in her mother; Hypertension in her mother; Rectal cancer in her maternal  uncle; Stroke in her maternal grandmother and mother. There is no history of Lung disease, Esophageal cancer, or Stomach cancer.  ROS:   Please see the history of present illness.  Additional pertinent ROS otherwise unremarkable.  EKGs/Labs/Other Studies Reviewed:    The following studies were reviewed today: Cath/PCI:  LHC(greater than 10 years ago in [place], [place]): Normal per patient's report.  CV Surgery:  None  EP Procedures and Devices:  48-hour Holter monitor (05/03/18): Predominantly sinus rhythm with rare PACs, PVCs, and brief atrial runs lasting up to 4 beats.  No sustained arrhythmia.  30-day event monitor (12/29/16): Predominantly sinus rhythm without significant abnormalities, though quite a bit of artifact was noted.  Atrial fibrillation cryoablation (08/17/16, Dr. Minna Merritts, [facility])  Atrial fibrillation radiofrequency ablation (2014)  Non-Invasive Evaluation(s):  TTE (01/12/17):  Normal LV size with mild LVH. LVEF 55-60% with grade 1 diastolic dysfunction. Moderate left atrial enlargement. Trivial pericardial effusion. Normal RV size and function. Normal CVP.  Pharmacologic myocardial perfusion stress test (12/29/16): Low-risk study with fixed mid and apical anterior/anteroseptal/septal defect most likely representing breast attenuation. Small basal inferolateral and basal anterolateral partially reversible defect that most likely represents shifting breast attenuation, though small area of ischemia cannot be excluded. LVEF 55-65%.  Limited TTE (09/13/16): Grossly normal LV size and function. Small pericardial effusion.  Limited TTE (09/06/16): Mild to moderate pericardial effusion.  Limited TTE (08/18/16): Small pericardial effusion near RV.  EKG:  EKG is personally reviewed today.  The ekg ordered 01/22/21 demonstrates NSR at 64 bpm with PRWP  Recent Labs: 07/29/2020: BUN 12; Creatinine, Ser 0.82; Potassium 4.5; Sodium 143  Recent Lipid Panel No results  found for: CHOL, TRIG, HDL, CHOLHDL, VLDL, LDLCALC, LDLDIRECT  Physical Exam:    VS:  BP 120/63   Pulse (!) 54   Ht _0  (1.753 m)   Wt 223 lb (101.2 kg)   BMI 32.93 kg/m     Wt Readings from Last 3 Encounters:  03/26/21 223 lb (101.2 kg)  01/22/21 226 lb 6.4 oz (102.7 kg)  11/10/20 225 lb 3.2 oz (102.2 kg)    Speaking comfortably on the phone, no audible wheezing In no acute distress Alert and oriented Normal affect Normal speech  ASSESSMENT:    1. Anticoagulant long-term use   2. Paroxysmal atrial fibrillation (HCC)   3. Bradycardia, sinus   4. Other fatigue    PLAN:    Shortness of breath, fatigue Paroxysmal atrial fibrillation, in sinus rhythm, on long term anticoagulation: -she is very symptomatic with her atrial fibrillation. Feels she has been in NSR for about the last month -has more energy, but heart rates have been low. Will cut amiodarone to 100 mg daily. She will contact me if she has recurrent afib -declines CPAP for OSA, cannot tolerate -CHA2DS2/VAS Stroke Risk Points =5, currently on coumadin. Have discussed DOAC in the past, declines -call with palpitations or worsening symptoms  Fibromyalgia, diffuse pain: -working with her pain management team  Hypertension: well controlled -currently on no antihypertensive agents  Today, I have spent 15 minutes with the patient with telehealth technology discussing the above problems.  Additional time spent in chart review, documentation, and communication.  Plan for follow up: 3 mos or sooner as needed  Medication Adjustments/Labs and Tests Ordered: Current medicines are reviewed at length with the patient today.  Concerns regarding medicines are outlined above.  No orders of the defined types were placed in this encounter.  Meds ordered this encounter  Medications  . amiodarone (PACERONE) 100 MG tablet    Sig: Take 1 tablet (100 mg total) by mouth daily.    Dispense:  90 tablet    Refill:  3    Patient  will call when she needs script filled, replaces 200 mg daily.    Patient Instructions  Medication Instructions:   Decrease Amiodarone to 100 mg daily   *If you need a refill on your cardiac medications before your next appointment, please call your pharmacy*   Lab Work: None   Testing/Procedures: None   Follow-Up: At [facility], you and your health needs are our priority.  As part of our continuing mission to provide you with exceptional heart care, we have created designated Provider Care Teams.  These Care Teams include your primary Cardiologist (physician) and Advanced Practice Providers (  APPs -  Physician Assistants and Nurse Practitioners) who all work together to provide you with the care you need, when you need it.  We recommend signing up for the patient portal called "MyChart".  Sign up information is provided on this After Visit Summary.  MyChart is used to connect with patients for Virtual Visits (Telemedicine).  Patients are able to view lab/test results, encounter notes, upcoming appointments, etc.  Non-urgent messages can be sent to your provider as well.   To learn more about what you can do with MyChart, go to [email or web address].    Your next appointment:   3 month(s) @ [address] [place], [place] [address]   The format for your next appointment:   In Person  Provider:   Buford Dresser, MD        Signed, Buford Dresser, MD PhD 03/26/2021     [place]

## 2021-04-15 ENCOUNTER — Ambulatory Visit (INDEPENDENT_AMBULATORY_CARE_PROVIDER_SITE_OTHER): Payer: Medicare Other | Admitting: *Deleted

## 2021-04-15 ENCOUNTER — Other Ambulatory Visit: Payer: Self-pay

## 2021-04-15 DIAGNOSIS — Z5181 Encounter for therapeutic drug level monitoring: Secondary | ICD-10-CM

## 2021-04-15 DIAGNOSIS — Z7901 Long term (current) use of anticoagulants: Secondary | ICD-10-CM

## 2021-04-15 DIAGNOSIS — Z86711 Personal history of pulmonary embolism: Secondary | ICD-10-CM | POA: Diagnosis not present

## 2021-04-15 LAB — POCT INR: INR: 2.4 (ref 2.0–3.0)

## 2021-04-15 NOTE — Patient Instructions (Signed)
Description   Continue taking Warfarin 1 tablet daily except 1/2 tablet on Mondays and Fridays. Recheck INR in 6 weeks.  Call Coumadin Clinic [phone] if you have any changes in medications.

## 2021-05-04 ENCOUNTER — Telehealth: Payer: Self-pay

## 2021-05-04 NOTE — Telephone Encounter (Signed)
  Care Management   Follow Up Note   05/04/2021 Name: Debbie [name] MRN: [id number] DOB: 1946/07/04   Referred by: [name], [name], MD Reason for referral : Chronic Care Management   Successful outbound call placed to [facility] to assist with care coordination needs. Patient able to verify HIPAA by stating name, DOB, and address. SW discussed the patient has access to work with RN Care Manager Peter Garter who is a member of the care management team at her primary providers office. SW assessed for patient interest in engaging with RN Care Manager to address disease management needs. Patient reported she has recently decided to switch primary care providers and is actively engaged with [facility] as primary provider due to their ability to visit her in the home. Patient requested SW notify Dr. [name] of her switch.   Follow Up Plan:  Collaboration with Dr. [name] to advise of patients decision to switch primary care providers.  SW to route note to embedded PharmD Jeni Salles to inform of patients switch. No follow up planned by this Probation officer.  Daneen Schick, BSW, CDP Social Worker, Certified Dementia Practitioner [facility] [phone]

## 2021-05-13 ENCOUNTER — Ambulatory Visit: Payer: Medicare Other | Admitting: Neurology

## 2021-05-27 ENCOUNTER — Ambulatory Visit (INDEPENDENT_AMBULATORY_CARE_PROVIDER_SITE_OTHER): Payer: Medicare Other | Admitting: *Deleted

## 2021-05-27 ENCOUNTER — Other Ambulatory Visit: Payer: Self-pay

## 2021-05-27 DIAGNOSIS — Z86711 Personal history of pulmonary embolism: Secondary | ICD-10-CM

## 2021-05-27 DIAGNOSIS — Z5181 Encounter for therapeutic drug level monitoring: Secondary | ICD-10-CM | POA: Diagnosis not present

## 2021-05-27 DIAGNOSIS — I2782 Chronic pulmonary embolism: Secondary | ICD-10-CM

## 2021-05-27 DIAGNOSIS — Z7901 Long term (current) use of anticoagulants: Secondary | ICD-10-CM

## 2021-05-27 LAB — POCT INR: INR: 3.3 — AB (ref 2.0–3.0)

## 2021-05-27 NOTE — Patient Instructions (Signed)
Description   Do not take any Warfarin today then continue taking Warfarin 1 tablet daily except 1/2 tablet on Mondays and Fridays. Recheck INR in 3 weeks. Call Coumadin Clinic [phone] if you have any changes in medications.

## 2021-06-07 ENCOUNTER — Telehealth: Payer: Medicare Other

## 2021-06-17 ENCOUNTER — Ambulatory Visit (INDEPENDENT_AMBULATORY_CARE_PROVIDER_SITE_OTHER): Payer: Medicare Other | Admitting: *Deleted

## 2021-06-17 ENCOUNTER — Other Ambulatory Visit: Payer: Self-pay

## 2021-06-17 DIAGNOSIS — Z5181 Encounter for therapeutic drug level monitoring: Secondary | ICD-10-CM | POA: Diagnosis not present

## 2021-06-17 LAB — POCT INR: INR: 3.3 — AB (ref 2.0–3.0)

## 2021-06-17 NOTE — Patient Instructions (Signed)
Description   Hold warfarin today and then start taking warfarin 1 tablet daily except for 1/2 a tablet on Monday, Wednesday and Friday. Recheck INR in 3 weeks. Coumadin Clinic [phone].

## 2021-06-23 ENCOUNTER — Ambulatory Visit: Payer: Medicare Other

## 2021-07-01 ENCOUNTER — Ambulatory Visit (INDEPENDENT_AMBULATORY_CARE_PROVIDER_SITE_OTHER): Payer: Medicare Other | Admitting: Cardiology

## 2021-07-01 ENCOUNTER — Encounter (HOSPITAL_BASED_OUTPATIENT_CLINIC_OR_DEPARTMENT_OTHER): Payer: Self-pay | Admitting: Cardiology

## 2021-07-01 ENCOUNTER — Other Ambulatory Visit: Payer: Self-pay

## 2021-07-01 VITALS — BP 127/66 | HR 54 | Ht 68.0 in | Wt 229.8 lb

## 2021-07-01 DIAGNOSIS — Z79899 Other long term (current) drug therapy: Secondary | ICD-10-CM | POA: Diagnosis not present

## 2021-07-01 DIAGNOSIS — R0602 Shortness of breath: Secondary | ICD-10-CM

## 2021-07-01 DIAGNOSIS — Z7901 Long term (current) use of anticoagulants: Secondary | ICD-10-CM | POA: Diagnosis not present

## 2021-07-01 DIAGNOSIS — I480 Paroxysmal atrial fibrillation: Secondary | ICD-10-CM

## 2021-07-01 DIAGNOSIS — R5383 Other fatigue: Secondary | ICD-10-CM

## 2021-07-01 NOTE — Progress Notes (Signed)
Cardiology Office Note:    Date:  07/01/2021   ID:  Debbie [name], DOB Dec 18, 1945, MRN [id number]  PCP:  [facility]  Cardiologist:  Buford Dresser, MD  CC: follow up  History of Present Illness:    Debbie [name] is a 75 y.o. female with a hx of persistent atrial fibrillation s/p ablation--now paroxysmal, pulmonary embolism (2017), hyperlipidemia, chronic kidney disease stage III, obesity, sleep apnea not on CPAP, fibromyalgia, headaches who is seen in follow up.   She was previously seen by Dr. Saunders Revel on 05/28/2018. At that visit, the plan was to continue anticoagulation, continue diltiazem (both standing and as needed). Recent monitor did not show sustained arrhythmias. She declined treatment for her OSA. I first met her on 09/03/18. Noted that she is very symptomatic with her atrial fibrillation.  Today: She is accompanied by a family member. Her main complaint today is that she has been feeling "weak as a kitten." Otherwise she seems to be doing well from a cardiovascular standpoint. At home her blood pressure and heart rate have been well-controlled.   Sometimes she feels pain in her right temple and around her right jaw. She is also suffering from chronic back pain and tremors. She is feeling similar to how she felt when an injection to her sacrum was needed. When trying to be active she easily becomes fatigued and feels weak. She believes the symptoms she is having may be side effects of Lamictal. Hydrocodone was also recently changed, and she normally takes 2 pills of this a day.   Blood work was performed recently by [employer]. The last 2-3 times her coumadin level has been greater than 3. Her lamictal level was abnormal, and she is looking into neurology management options.  She denies any palpitations, chest pain, or shortness of breath. No lightheadedness, syncope, orthopnea, or PND. Also has no lower extremity edema or exertional  symptoms.   Past Medical History:  Diagnosis Date   Arthritis    Atrial fibrillation (HCC)    Benign essential tremor    Breast cancer ([place])    Cataract    Bilateral 2018   Chronic renal insufficiency    Common migraine 1/75/1025   Complication of anesthesia    Depression with anxiety    Drowsiness    Excessive daytime   Dyslipidemia    Fibromyalgia    History of partial seizures    Hypertension    Memory disturbance    Muscle tension headache    Obesity    Obstructive sleep apnea    PONV (postoperative nausea and vomiting)    S/P epidural steroid injection    Sacral contusion 07/2018   Seizures (HCC)    Partial   Sleep apnea    does not wear CPAP   Sprain of neck 06/26/2013   Tremors of nervous system    Vitamin B12 deficiency     Past Surgical History:  Procedure Laterality Date   ABDOMINAL HYSTERECTOMY     ablation procedure     Cardiac, for Afib   BASAL CELL CARCINOMA EXCISION     BASAL CELL CARCINOMA EXCISION     BRAIN MENINGIOMA EXCISION Right    BREAST RECONSTRUCTION Bilateral    breast reconstructive     CATARACT EXTRACTION, BILATERAL     CHOLECYSTECTOMY     DILATION AND CURETTAGE OF UTERUS     dilution     ESOPHAGEAL MANOMETRY N/A 12/14/2016   Procedure: ESOPHAGEAL MANOMETRY (EM);  Surgeon:  Mauri Pole, MD;  Location: [facility];  Service: Endoscopy;  Laterality: N/A;   gallbladder resection     LUMBAR SPINE SURGERY N/A    lumbosacral     mastectomies Bilateral    NOSE SURGERY     Basal cell carcinoma resection from the nose   TUBAL LIGATION N/A     Current Medications: Current Outpatient Medications on File Prior to Visit  Medication Sig   acetaminophen (TYLENOL) 500 MG tablet Take 500 mg by mouth in the morning and at bedtime.   amiodarone (PACERONE) 100 MG tablet Take 1 tablet (100 mg total) by mouth daily.   Ascorbic Acid (VITAMIN C PO) Take 282 mg by mouth daily.    calcium carbonate (OS-CAL) 600 MG TABS tablet Take 600 mg by  mouth daily with breakfast. With vitamin D 800 units   gabapentin (NEURONTIN) 600 MG tablet Take 600 mg by mouth 2 (two) times daily.   HYDROcodone-acetaminophen (NORCO/VICODIN) 5-325 MG tablet Take 1 tablet by mouth 3 (three) times daily as needed for moderate pain.    lamoTRIgine (LAMICTAL) 25 MG tablet Take 1 tablet (25 mg total) by mouth 2 (two) times daily.   LEVALBUTEROL HCL IN Inhale into the lungs as needed.    Multiple Vitamins-Minerals (ZINC PO) Take 50 mg by mouth daily.    polyethylene glycol (MIRALAX / GLYCOLAX) packet Take 17 g by mouth as needed.   promethazine (PHENERGAN) 25 MG tablet Take 6.25 mg by mouth every 6 (six) hours as needed for nausea or vomiting.   vitamin B-12 (CYANOCOBALAMIN) 1000 MCG tablet Take 1,000 mcg by mouth daily.   Vitamin D, Ergocalciferol, (DRISDOL) 1.25 MG (50000 UNIT) CAPS capsule TAKE 1 CAPSULE BY MOUTH EVERY 10 DAYS   warfarin (COUMADIN) 5 MG tablet Take 1 tablet daily or as directed by Anticoagulation Clinic.   No current facility-administered medications on file prior to visit.     Allergies:   Diclofenac, Atorvastatin, Tape, Albuterol, Lasix [furosemide], Mushroom extract complex, Tramadol, Buprenorphine hcl, Codeine, Fish oil, and Morphine and related   Social History   Tobacco Use   Smoking status: Never   Smokeless tobacco: Never  Vaping Use   Vaping Use: Never used  Substance Use Topics   Alcohol use: No   Drug use: No    Family History: The patient's family history includes Arthritis in her father and mother; Breast cancer in her sister; Cancer in her father; Cancer - Colon in her father; Colon cancer in her father; Diabetes in her maternal grandfather and mother; Heart disease in her mother; Heart failure in her maternal grandfather and mother; Hyperlipidemia in her mother; Hypertension in her mother; Rectal cancer in her maternal uncle; Stroke in her maternal grandmother and mother. There is no history of Lung disease, Esophageal  cancer, or Stomach cancer.  ROS:   Please see the history of present illness.   (+) Generalized weakness (+) Pain, right temple (+) Back pain (+) Tremors Additional pertinent ROS otherwise unremarkable.  EKGs/Labs/Other Studies Reviewed:    The following studies were reviewed today: Cath/PCI: LHC (greater than 10 years ago in [place], [place]): Normal per patient's report.   CV Surgery: None   EP Procedures and Devices: 48-hour Holter monitor (05/03/18): Predominantly sinus rhythm with rare PACs, PVCs, and brief atrial runs lasting up to 4 beats.  No sustained arrhythmia. 30-day event monitor (12/29/16): Predominantly sinus rhythm without significant abnormalities, though quite a bit of artifact was noted. Atrial fibrillation cryoablation (08/17/16, Dr. Minna Merritts, [facility]  [place]) Atrial fibrillation radiofrequency ablation (2014)   Non-Invasive Evaluation(s): Echo 03/05/2019:  1. The left ventricle has normal systolic function, with an ejection  fraction of 55-60%. The cavity size was normal. Left ventricular diastolic  function could not be evaluated secondary to atrial fibrillation.   2. The entire echo was extremely difficult and the images are suboptimal.   3. The right ventricle has normal systolic function. The cavity was  normal. There is no increase in right ventricular wall thickness.   4. Right atrial size was mildly dilated.   5. Trivial pericardial effusion is present.   6. The mitral valve is grossly normal.   7. The aortic valve was not assessed.   8. The interatrial septum was not assessed.  TTE (01/12/17): Normal LV size with mild LVH. LVEF 55-60% with grade 1 diastolic dysfunction. Moderate left atrial enlargement. Trivial pericardial effusion. Normal RV size and function. Normal CVP. Pharmacologic myocardial perfusion stress test (12/29/16): Low-risk study with fixed mid and apical anterior/anteroseptal/septal defect most likely representing breast attenuation. Small  basal inferolateral and basal anterolateral partially reversible defect that most likely represents shifting breast attenuation, though small area of ischemia cannot be excluded. LVEF 55-65%. Limited TTE (09/13/16): Grossly normal LV size and function. Small pericardial effusion. Limited TTE (09/06/16): Mild to moderate pericardial effusion. Limited TTE (08/18/16): Small pericardial effusion near RV.  EKG:  EKG is personally reviewed today.   07/01/2021: sinus bradycardia at 54 bpm with PRWP 01/22/21: NSR at 64 bpm with PRWP  Recent Labs: 07/29/2020: BUN 12; Creatinine, Ser 0.82; Potassium 4.5; Sodium 143  Recent Lipid Panel No results found for: CHOL, TRIG, HDL, CHOLHDL, VLDL, LDLCALC, LDLDIRECT  Physical Exam:    VS:  BP 127/66   Pulse (!) 54   Ht '5\' 8"'  (1.727 m)   Wt 229 lb 12.8 oz (104.2 kg)   SpO2 94%   BMI 34.94 kg/m     Wt Readings from Last 3 Encounters:  07/01/21 229 lb 12.8 oz (104.2 kg)  03/26/21 223 lb (101.2 kg)  01/22/21 226 lb 6.4 oz (102.7 kg)    GEN: Well nourished, well developed in no acute distress HEENT: Normal, moist mucous membranes NECK: No JVD CARDIAC: regular rhythm, normal S1 and S2, no rubs or gallops. No murmur. VASCULAR: Radial and DP pulses 2+ bilaterally. No carotid bruits RESPIRATORY:  Clear to auscultation without rales, wheezing or rhonchi  ABDOMEN: Soft, non-tender, non-distended MUSCULOSKELETAL:  Ambulates independently SKIN: Warm and dry, no edema NEUROLOGIC:  Alert and oriented x 3. No focal neuro deficits noted. PSYCHIATRIC:  Normal affect    ASSESSMENT:    1. Paroxysmal atrial fibrillation (HCC)   2. Medication management   3. Anticoagulant long-term use   4. SOB (shortness of breath)   5. Other fatigue     PLAN:    Shortness of breath, fatigue Paroxysmal atrial fibrillation, in sinus rhythm, on long term anticoagulation: -doing very well on long term amiodarone, feels that she has been continuously in sinus rhythm -will  check thyroid, liver studies. Will try to have these done through [facility] if possible -declines CPAP for OSA, cannot tolerate -CHA2DS2/VAS Stroke Risk Points =5, currently on coumadin. Have discussed DOAC in the past, declines until there is a generic available -will call with palpitations or worsening symptoms  Fibromyalgia, diffuse pain: -working with her pain management team  Plan for follow up: 6 mos or sooner as needed  Medication Adjustments/Labs and Tests Ordered: Current medicines are reviewed at length  with the patient today.  Concerns regarding medicines are outlined above.   Orders Placed This Encounter  Procedures   TSH   Comprehensive metabolic panel   EKG 33-POIP    No orders of the defined types were placed in this encounter.  Patient Instructions   Medication Instructions:  Your Physician recommend you continue on your current medication as directed.    *If you need a refill on your cardiac medications before your next appointment, please call your pharmacy*   Lab Work: Your physician recommends lab work ( TSH, CMP).  If you have labs (blood work) drawn today and your tests are completely normal, you will receive your results only by: [place] (if you have MyChart) OR A paper copy in the mail If you have any lab test that is abnormal or we need to change your treatment, we will call you to review the results.   Testing/Procedures: None ordered today   Follow-Up: At [facility], you and your health needs are our priority.  As part of our continuing mission to provide you with exceptional heart care, we have created designated Provider Care Teams.  These Care Teams include your primary Cardiologist (physician) and Advanced Practice Providers (APPs -  Physician Assistants and Nurse Practitioners) who all work together to provide you with the care you need, when you need it.  We recommend signing up for the patient portal called "MyChart".   Sign up information is provided on this After Visit Summary.  MyChart is used to connect with patients for Virtual Visits (Telemedicine).  Patients are able to view lab/test results, encounter notes, upcoming appointments, etc.  Non-urgent messages can be sent to your provider as well.   To learn more about what you can do with MyChart, go to [email or web address].    Your next appointment:   6 month(s)  The format for your next appointment:   In Person  Provider:   Buford Dresser, MD     [facility],acting as a scribe for Buford Dresser, MD.,have documented all relevant documentation on the behalf of Buford Dresser, MD,as directed by  Buford Dresser, MD while in the presence of Buford Dresser, MD.  I, Buford Dresser, MD, have reviewed all documentation for this visit. The documentation on 07/01/21 for the exam, diagnosis, procedures, and orders are all accurate and complete.   Signed, Buford Dresser, MD PhD 07/01/2021     [place]

## 2021-07-01 NOTE — Patient Instructions (Signed)
Medication Instructions:  Your Physician recommend you continue on your current medication as directed.    *If you need a refill on your cardiac medications before your next appointment, please call your pharmacy*   Lab Work: Your physician recommends lab work ( TSH, CMP).  If you have labs (blood work) drawn today and your tests are completely normal, you will receive your results only by: [place] (if you have MyChart) OR A paper copy in the mail If you have any lab test that is abnormal or we need to change your treatment, we will call you to review the results.   Testing/Procedures: None ordered today   Follow-Up: At [facility], you and your health needs are our priority.  As part of our continuing mission to provide you with exceptional heart care, we have created designated Provider Care Teams.  These Care Teams include your primary Cardiologist (physician) and Advanced Practice Providers (APPs -  Physician Assistants and Nurse Practitioners) who all work together to provide you with the care you need, when you need it.  We recommend signing up for the patient portal called "MyChart".  Sign up information is provided on this After Visit Summary.  MyChart is used to connect with patients for Virtual Visits (Telemedicine).  Patients are able to view lab/test results, encounter notes, upcoming appointments, etc.  Non-urgent messages can be sent to your provider as well.   To learn more about what you can do with MyChart, go to [email or web address].    Your next appointment:   6 month(s)  The format for your next appointment:   In Person  Provider:   Buford Dresser, MD

## 2021-07-07 ENCOUNTER — Other Ambulatory Visit: Payer: Self-pay | Admitting: Nurse Practitioner

## 2021-07-08 ENCOUNTER — Ambulatory Visit (INDEPENDENT_AMBULATORY_CARE_PROVIDER_SITE_OTHER): Payer: Medicare Other

## 2021-07-08 ENCOUNTER — Other Ambulatory Visit: Payer: Self-pay

## 2021-07-08 DIAGNOSIS — Z5181 Encounter for therapeutic drug level monitoring: Secondary | ICD-10-CM

## 2021-07-08 LAB — POCT INR: INR: 2.1 (ref 2.0–3.0)

## 2021-07-08 NOTE — Patient Instructions (Signed)
Description   Continue taking warfarin 1 tablet daily except for 1/2 a tablet on Monday, Wednesday and Friday. Recheck INR in 4 weeks. [facility] [phone].

## 2021-08-05 ENCOUNTER — Other Ambulatory Visit: Payer: Self-pay

## 2021-08-05 ENCOUNTER — Ambulatory Visit (INDEPENDENT_AMBULATORY_CARE_PROVIDER_SITE_OTHER): Payer: Medicare Other

## 2021-08-05 DIAGNOSIS — Z5181 Encounter for therapeutic drug level monitoring: Secondary | ICD-10-CM

## 2021-08-05 LAB — POCT INR: INR: 2 (ref 2.0–3.0)

## 2021-08-05 MED ORDER — WARFARIN SODIUM 5 MG PO TABS: ORAL_TABLET | ORAL | 1 refills | Status: DC

## 2021-08-05 NOTE — Patient Instructions (Signed)
Description   Take 1.5 tablets today and then continue taking warfarin 1 tablet daily except for 1/2 a tablet on Monday, Wednesday and Friday. Recheck INR in 5 weeks. [facility] [phone].

## 2021-08-05 NOTE — Telephone Encounter (Signed)
Prescription refill request received for warfarin Lov: 07/01/21 Debbie [name])  Next INR check: 09/09/21 Warfarin tablet strength: 5mg   Appropriate dose and refill sent to requested pharmacy.

## 2021-09-09 ENCOUNTER — Other Ambulatory Visit: Payer: Self-pay

## 2021-09-09 ENCOUNTER — Ambulatory Visit (INDEPENDENT_AMBULATORY_CARE_PROVIDER_SITE_OTHER): Payer: Medicare Other | Admitting: *Deleted

## 2021-09-09 DIAGNOSIS — Z5181 Encounter for therapeutic drug level monitoring: Secondary | ICD-10-CM

## 2021-09-09 LAB — POCT INR: INR: 3.7 — AB (ref 2.0–3.0)

## 2021-09-09 NOTE — Patient Instructions (Signed)
Description   Hold warfarin today, then Continue taking warfarin 1 tablet daily except for 1/2 a tablet on Monday, Wednesday and Friday. Recheck INR in 3 weeks. [facility] [phone].

## 2021-09-29 ENCOUNTER — Other Ambulatory Visit: Payer: Self-pay

## 2021-09-29 ENCOUNTER — Ambulatory Visit (INDEPENDENT_AMBULATORY_CARE_PROVIDER_SITE_OTHER): Payer: Medicare Other

## 2021-09-29 DIAGNOSIS — Z5181 Encounter for therapeutic drug level monitoring: Secondary | ICD-10-CM | POA: Diagnosis not present

## 2021-09-29 LAB — POCT INR: INR: 3.4 — AB (ref 2.0–3.0)

## 2021-09-29 NOTE — Patient Instructions (Signed)
Description   Hold warfarin today, then continue taking warfarin 1 tablet daily except for 1/2 a tablet on Monday, Wednesday, and Friday. Recheck INR in 3 weeks. Be consistent with Boost (2-3 per week). [facility] [phone].

## 2021-10-20 ENCOUNTER — Other Ambulatory Visit: Payer: Self-pay

## 2021-10-20 ENCOUNTER — Ambulatory Visit (INDEPENDENT_AMBULATORY_CARE_PROVIDER_SITE_OTHER): Payer: Medicare Other | Admitting: *Deleted

## 2021-10-20 DIAGNOSIS — Z7901 Long term (current) use of anticoagulants: Secondary | ICD-10-CM

## 2021-10-20 DIAGNOSIS — Z86711 Personal history of pulmonary embolism: Secondary | ICD-10-CM

## 2021-10-20 DIAGNOSIS — I2782 Chronic pulmonary embolism: Secondary | ICD-10-CM

## 2021-10-20 DIAGNOSIS — Z5181 Encounter for therapeutic drug level monitoring: Secondary | ICD-10-CM

## 2021-10-20 LAB — POCT INR: INR: 2.6 (ref 2.0–3.0)

## 2021-10-20 NOTE — Patient Instructions (Addendum)
Description   Continue taking warfarin 1 tablet daily except for 1/2 tablet on Monday, Wednesday, and Friday. Recheck INR in 4 weeks. Be consistent with Boost (2-3 per week). [facility] [phone].

## 2021-11-17 ENCOUNTER — Ambulatory Visit (INDEPENDENT_AMBULATORY_CARE_PROVIDER_SITE_OTHER): Payer: Medicare HMO | Admitting: *Deleted

## 2021-11-17 ENCOUNTER — Other Ambulatory Visit: Payer: Self-pay

## 2021-11-17 DIAGNOSIS — Z5181 Encounter for therapeutic drug level monitoring: Secondary | ICD-10-CM | POA: Diagnosis not present

## 2021-11-17 LAB — POCT INR: INR: 2.7 (ref 2.0–3.0)

## 2021-11-17 NOTE — Patient Instructions (Addendum)
Description   Continue taking warfarin 1 tablet daily except for 1/2 tablet on Monday, Wednesday, and Friday. Recheck INR in 5 weeks. Be consistent with Boost (2-3 per week). [facility] [phone].

## 2021-12-22 ENCOUNTER — Ambulatory Visit (INDEPENDENT_AMBULATORY_CARE_PROVIDER_SITE_OTHER): Payer: Medicare HMO

## 2021-12-22 ENCOUNTER — Other Ambulatory Visit: Payer: Self-pay

## 2021-12-22 DIAGNOSIS — Z5181 Encounter for therapeutic drug level monitoring: Secondary | ICD-10-CM

## 2021-12-22 LAB — POCT INR: INR: 2 (ref 2.0–3.0)

## 2021-12-22 NOTE — Patient Instructions (Signed)
Description   Take 1 tablet today and then continue taking warfarin 1 tablet daily except for 1/2 tablet on Monday, Wednesday, and Friday. Recheck INR in 6 weeks. Be consistent with Boost (2-3 per week). [facility] [phone].

## 2021-12-31 ENCOUNTER — Encounter (HOSPITAL_BASED_OUTPATIENT_CLINIC_OR_DEPARTMENT_OTHER): Payer: Self-pay | Admitting: Cardiology

## 2021-12-31 ENCOUNTER — Ambulatory Visit (INDEPENDENT_AMBULATORY_CARE_PROVIDER_SITE_OTHER): Payer: Medicare HMO | Admitting: Cardiology

## 2021-12-31 ENCOUNTER — Other Ambulatory Visit: Payer: Self-pay

## 2021-12-31 VITALS — BP 140/66 | HR 64 | Ht 68.0 in | Wt 236.2 lb

## 2021-12-31 DIAGNOSIS — M797 Fibromyalgia: Secondary | ICD-10-CM

## 2021-12-31 DIAGNOSIS — R0602 Shortness of breath: Secondary | ICD-10-CM

## 2021-12-31 DIAGNOSIS — Z7901 Long term (current) use of anticoagulants: Secondary | ICD-10-CM

## 2021-12-31 DIAGNOSIS — I480 Paroxysmal atrial fibrillation: Secondary | ICD-10-CM

## 2021-12-31 DIAGNOSIS — R5383 Other fatigue: Secondary | ICD-10-CM | POA: Diagnosis not present

## 2021-12-31 NOTE — Progress Notes (Incomplete)
Cardiology Office Note:    Date:  12/31/2021   ID:  Debbie [name], DOB 1946/08/10, MRN [id number]  PCP:  [facility]  Cardiologist:  Buford Dresser, MD  CC: follow up  History of Present Illness:    Debbie [name] is a 76 y.o. female with a hx of persistent atrial fibrillation s/p ablation--now paroxysmal, pulmonary embolism (2017), hyperlipidemia, chronic kidney disease stage III, obesity, sleep apnea not on CPAP, fibromyalgia, headaches who is seen in follow up.   She was previously seen by Dr. Saunders Revel on 05/28/2018. At that visit, the plan was to continue anticoagulation, continue diltiazem (both standing and as needed). Recent monitor did not show sustained arrhythmias. She declined treatment for her OSA. I first met her on 09/03/18. Noted that she is very symptomatic with her atrial fibrillation.  Today: She is accompanied by a family member. Overall, she is feeling alright. However, she is feeling too heavy, and generally feels weak with malaise.    From her knees down she has severe sensitivity and pain in her bilateral LE. She states they also ache with tingling and burning sensations. A few weeks ago she suffered a ruptured vein in her LLE.  They present a BP log. Her blood pressure is averaging in the upper 120s-130s/60s-70s. Lately her coumadin level has been within normal limits. Since beginning amiodarone she believes she may have had one minor episode of Afib, but not recently.   She denies any palpitations, chest pain, or shortness of breath. No lightheadedness, headaches, syncope, orthopnea, or PND. No hematuria or hematochezia.   Past Medical History:  Diagnosis Date   Arthritis    Atrial fibrillation (HCC)    Benign essential tremor    Breast cancer ([place])    Cataract    Bilateral 2018   Chronic renal insufficiency    Common migraine 8/41/3244   Complication of anesthesia    Depression with anxiety    Drowsiness    Excessive daytime    Dyslipidemia    Fibromyalgia    History of partial seizures    Hypertension    Memory disturbance    Muscle tension headache    Obesity    Obstructive sleep apnea    PONV (postoperative nausea and vomiting)    S/P epidural steroid injection    Sacral contusion 07/2018   Seizures (HCC)    Partial   Sleep apnea    does not wear CPAP   Sprain of neck 06/26/2013   Tremors of nervous system    Vitamin B12 deficiency     Past Surgical History:  Procedure Laterality Date   ABDOMINAL HYSTERECTOMY     ablation procedure     Cardiac, for Afib   BASAL CELL CARCINOMA EXCISION     BASAL CELL CARCINOMA EXCISION     BRAIN MENINGIOMA EXCISION Right    BREAST RECONSTRUCTION Bilateral    breast reconstructive     CATARACT EXTRACTION, BILATERAL     CHOLECYSTECTOMY     DILATION AND CURETTAGE OF UTERUS     dilution     ESOPHAGEAL MANOMETRY N/A 12/14/2016   Procedure: ESOPHAGEAL MANOMETRY (EM);  Surgeon: Mauri Pole, MD;  Location: [facility];  Service: Endoscopy;  Laterality: N/A;   gallbladder resection     LUMBAR SPINE SURGERY N/A    lumbosacral     mastectomies Bilateral    NOSE SURGERY     Basal cell carcinoma resection from the nose   TUBAL LIGATION N/A  Current Medications: Current Outpatient Medications on File Prior to Visit  Medication Sig   acetaminophen (TYLENOL) 500 MG tablet Take 500 mg by mouth in the morning and at bedtime.   amiodarone (PACERONE) 100 MG tablet Take 1 tablet (100 mg total) by mouth daily.   Ascorbic Acid (VITAMIN C PO) Take 282 mg by mouth daily.    calcium carbonate (OS-CAL) 600 MG TABS tablet Take 600 mg by mouth daily with breakfast. With vitamin D 800 units   gabapentin (NEURONTIN) 600 MG tablet Take 600 mg by mouth 2 (two) times daily.   HYDROcodone-acetaminophen (NORCO/VICODIN) 5-325 MG tablet Take 1 tablet by mouth 3 (three) times daily as needed for moderate pain.    lamoTRIgine (LAMICTAL) 25 MG tablet Take 1 tablet (25 mg total) by  mouth 2 (two) times daily.   LEVALBUTEROL HCL IN Inhale into the lungs as needed.    Multiple Vitamins-Minerals (ZINC PO) Take 50 mg by mouth daily.    polyethylene glycol (MIRALAX / GLYCOLAX) packet Take 17 g by mouth as needed.   promethazine (PHENERGAN) 25 MG tablet Take 6.25 mg by mouth every 6 (six) hours as needed for nausea or vomiting.   vitamin B-12 (CYANOCOBALAMIN) 1000 MCG tablet Take 1,000 mcg by mouth daily.   Vitamin D, Ergocalciferol, (DRISDOL) 1.25 MG (50000 UNIT) CAPS capsule TAKE 1 CAPSULE BY MOUTH EVERY 10 DAYS   warfarin (COUMADIN) 5 MG tablet Take 1 tablet daily or as directed by Anticoagulation Clinic.   No current facility-administered medications on file prior to visit.     Allergies:   Diclofenac, Atorvastatin, Tape, Albuterol, Lasix [furosemide], Mushroom extract complex, Tramadol, Buprenorphine hcl, Codeine, Fish oil, and Morphine and related   Social History   Tobacco Use   Smoking status: Never   Smokeless tobacco: Never  Vaping Use   Vaping Use: Never used  Substance Use Topics   Alcohol use: No   Drug use: No    Family History: The patient's family history includes Arthritis in her father and mother; Breast cancer in her sister; Cancer in her father; Cancer - Colon in her father; Colon cancer in her father; Diabetes in her maternal grandfather and mother; Heart disease in her mother; Heart failure in her maternal grandfather and mother; Hyperlipidemia in her mother; Hypertension in her mother; Rectal cancer in her maternal uncle; Stroke in her maternal grandmother and mother. There is no history of Lung disease, Esophageal cancer, or Stomach cancer.  ROS:   Please see the history of present illness.   (+) Malaise/fatigue (+) Generalized weakness (+) Bilateral LE sensitivity and pain (+) Bilateral LE tingling and burning sensations Additional pertinent ROS otherwise unremarkable.  EKGs/Labs/Other Studies Reviewed:    The following studies were  reviewed today: Cath/PCI: LHC (greater than 10 years ago in [place], [place]): Normal per patient's report.   CV Surgery: None   EP Procedures and Devices: 48-hour Holter monitor (05/03/18): Predominantly sinus rhythm with rare PACs, PVCs, and brief atrial runs lasting up to 4 beats.  No sustained arrhythmia. 30-day event monitor (12/29/16): Predominantly sinus rhythm without significant abnormalities, though quite a bit of artifact was noted. Atrial fibrillation cryoablation (08/17/16, Dr. Minna Merritts, [facility]) Atrial fibrillation radiofrequency ablation (2014)   Non-Invasive Evaluation(s): Echo 03/05/2019:  1. The left ventricle has normal systolic function, with an ejection  fraction of 55-60%. The cavity size was normal. Left ventricular diastolic  function could not be evaluated secondary to atrial fibrillation.   2. The entire echo was extremely  difficult and the images are suboptimal.   3. The right ventricle has normal systolic function. The cavity was  normal. There is no increase in right ventricular wall thickness.   4. Right atrial size was mildly dilated.   5. Trivial pericardial effusion is present.   6. The mitral valve is grossly normal.   7. The aortic valve was not assessed.   8. The interatrial septum was not assessed.  TTE (01/12/17): Normal LV size with mild LVH. LVEF 55-60% with grade 1 diastolic dysfunction. Moderate left atrial enlargement. Trivial pericardial effusion. Normal RV size and function. Normal CVP. Pharmacologic myocardial perfusion stress test (12/29/16): Low-risk study with fixed mid and apical anterior/anteroseptal/septal defect most likely representing breast attenuation. Small basal inferolateral and basal anterolateral partially reversible defect that most likely represents shifting breast attenuation, though small area of ischemia cannot be excluded. LVEF 55-65%. Limited TTE (09/13/16): Grossly normal LV size and function. Small pericardial  effusion. Limited TTE (09/06/16): Mild to moderate pericardial effusion. Limited TTE (08/18/16): Small pericardial effusion near RV.  EKG:  EKG is personally reviewed. 12/31/2021: ***   07/01/2021: sinus bradycardia at 54 bpm with PRWP 01/22/21: NSR at 64 bpm with PRWP  Recent Labs: No results found for requested labs within last 8760 hours.   Recent Lipid Panel No results found for: CHOL, TRIG, HDL, CHOLHDL, VLDL, LDLCALC, LDLDIRECT  Physical Exam:    VS:  BP 140/66    Pulse 64    Ht 5' 8" (1.727 m)    Wt 236 lb 3.2 oz (107.1 kg)    SpO2 96%    BMI 35.91 kg/m     Wt Readings from Last 3 Encounters:  12/31/21 236 lb 3.2 oz (107.1 kg)  07/01/21 229 lb 12.8 oz (104.2 kg)  03/26/21 223 lb (101.2 kg)    GEN: Well nourished, well developed in no acute distress HEENT: Normal, moist mucous membranes NECK: No JVD CARDIAC: regular rhythm, normal S1 and S2, no rubs or gallops. No murmur. VASCULAR: Radial and DP pulses 2+ bilaterally. No carotid bruits RESPIRATORY:  Clear to auscultation without rales, wheezing or rhonchi  ABDOMEN: Soft, non-tender, non-distended MUSCULOSKELETAL:  Ambulates independently SKIN: Warm and dry, ***+ LE edema bilaterally, severe tenderness on palpation. NEUROLOGIC:  Alert and oriented x 3. No focal neuro deficits noted. PSYCHIATRIC:  Normal affect    ASSESSMENT:    No diagnosis found.   PLAN:    Shortness of breath, fatigue Paroxysmal atrial fibrillation, in sinus rhythm, on long term anticoagulation: -doing very well on long term amiodarone, feels that she has been continuously in sinus rhythm -will check thyroid, liver studies. Will try to have these done through [facility] if possible -declines CPAP for OSA, cannot tolerate -CHA2DS2/VAS Stroke Risk Points =5, currently on coumadin. Have discussed DOAC in the past, declines until there is a generic available -will call with palpitations or worsening symptoms  Fibromyalgia, diffuse  pain: -working with her pain management team  Plan for follow up: 6 mos or sooner as needed  Medication Adjustments/Labs and Tests Ordered: Current medicines are reviewed at length with the patient today.  Concerns regarding medicines are outlined above.   No orders of the defined types were placed in this encounter.  No orders of the defined types were placed in this encounter.  Patient Instructions  Medication Instructions:  Your physician recommends that you continue on your current medications as directed. Please refer to the Current Medication list given to you today.   *If you  need a refill on your cardiac medications before your next appointment, please call your pharmacy*  Lab Work: NONE  Testing/Procedures: NONE  Follow-Up: At [facility], you and your health needs are our priority.  As part of our continuing mission to provide you with exceptional heart care, we have created designated Provider Care Teams.  These Care Teams include your primary Cardiologist (physician) and Advanced Practice Providers (APPs -  Physician Assistants and Nurse Practitioners) who all work together to provide you with the care you need, when you need it.  We recommend signing up for the patient portal called "MyChart".  Sign up information is provided on this After Visit Summary.  MyChart is used to connect with patients for Virtual Visits (Telemedicine).  Patients are able to view lab/test results, encounter notes, upcoming appointments, etc.  Non-urgent messages can be sent to your provider as well.   To learn more about what you can do with MyChart, go to [email or web address].    Your next appointment:   6 month(s)  The format for your next appointment:   In Person  Provider:   Buford Dresser, MD       [facility],acting as a scribe for Buford Dresser, MD.,have documented all relevant documentation on the behalf of Buford Dresser, MD,as directed by   Buford Dresser, MD while in the presence of Buford Dresser, MD.  I, Madelin Rear, have reviewed all documentation for this visit. The documentation on 12/31/21 for the exam, diagnosis, procedures, and orders are all accurate and complete.   Signed, Buford Dresser, MD PhD 12/31/2021     [place]

## 2021-12-31 NOTE — Patient Instructions (Signed)
Medication Instructions:  Your physician recommends that you continue on your current medications as directed. Please refer to the Current Medication list given to you today.  *If you need a refill on your cardiac medications before your next appointment, please call your pharmacy*  Lab Work: NONE  Testing/Procedures: NONE  Follow-Up: At [facility], you and your health needs are our priority.  As part of our continuing mission to provide you with exceptional heart care, we have created designated Provider Care Teams.  These Care Teams include your primary Cardiologist (physician) and Advanced Practice Providers (APPs -  Physician Assistants and Nurse Practitioners) who all work together to provide you with the care you need, when you need it.  We recommend signing up for the patient portal called "MyChart".  Sign up information is provided on this After Visit Summary.  MyChart is used to connect with patients for Virtual Visits (Telemedicine).  Patients are able to view lab/test results, encounter notes, upcoming appointments, etc.  Non-urgent messages can be sent to your provider as well.   To learn more about what you can do with MyChart, go to [email or web address].    Your next appointment:   6 month(s)  The format for your next appointment:   In Person  Provider:   Bridgette Christopher, MD     

## 2022-01-03 ENCOUNTER — Encounter: Payer: Self-pay | Admitting: Cardiology

## 2022-01-03 ENCOUNTER — Ambulatory Visit: Payer: Medicare HMO | Admitting: Physician Assistant

## 2022-01-03 NOTE — Telephone Encounter (Signed)
Error

## 2022-01-30 ENCOUNTER — Other Ambulatory Visit: Payer: Self-pay | Admitting: Cardiology

## 2022-01-31 ENCOUNTER — Ambulatory Visit: Payer: Medicare HMO | Admitting: Physician Assistant

## 2022-02-02 ENCOUNTER — Ambulatory Visit (INDEPENDENT_AMBULATORY_CARE_PROVIDER_SITE_OTHER): Payer: Medicare HMO | Admitting: *Deleted

## 2022-02-02 DIAGNOSIS — Z5181 Encounter for therapeutic drug level monitoring: Secondary | ICD-10-CM

## 2022-02-02 LAB — POCT INR: INR: 2.6 (ref 2.0–3.0)

## 2022-02-02 NOTE — Patient Instructions (Signed)
Description   ?Continue taking warfarin 1 tablet daily except for 1/2 tablet on Monday, Wednesday, and Friday. Recheck INR in 6 weeks. Be consistent with Boost (2-3 per week). [facility] [phone].  ?  ? ? ?

## 2022-02-08 ENCOUNTER — Encounter (HOSPITAL_BASED_OUTPATIENT_CLINIC_OR_DEPARTMENT_OTHER): Payer: Self-pay | Admitting: Cardiology

## 2022-02-25 ENCOUNTER — Ambulatory Visit: Payer: Medicare HMO | Admitting: Nurse Practitioner

## 2022-03-16 ENCOUNTER — Ambulatory Visit: Payer: Medicare HMO

## 2022-03-16 DIAGNOSIS — Z7901 Long term (current) use of anticoagulants: Secondary | ICD-10-CM | POA: Diagnosis not present

## 2022-03-16 DIAGNOSIS — Z5181 Encounter for therapeutic drug level monitoring: Secondary | ICD-10-CM | POA: Diagnosis not present

## 2022-03-16 DIAGNOSIS — Z86711 Personal history of pulmonary embolism: Secondary | ICD-10-CM | POA: Diagnosis not present

## 2022-03-16 LAB — POCT INR: INR: 3.4 — AB (ref 2.0–3.0)

## 2022-03-16 NOTE — Patient Instructions (Signed)
Description   ?Hold today's dose and then continue taking warfarin 1 tablet daily except for 1/2 tablet on Monday, Wednesday, and Friday.  ?Recheck INR in 5 weeks.  ?Be consistent with Boost (2-3 per week).  ?[facility] [phone].  ?  ?   ?

## 2022-03-25 ENCOUNTER — Telehealth: Payer: Self-pay | Admitting: Cardiology

## 2022-03-25 NOTE — Telephone Encounter (Signed)
I reviewed the comments and her prior chart. Her heart rate has been in the 50s-60s routinely, and I do not think that the pulse of 47 is the likely cause of her symptoms. The last set of vitals is actually perfect. There is no reason from a cardiac perspective that the amoxicillin should cause problems, but I would recommend discussing with her dentist if she had a reaction or some type of sensitivity to the medication that caused dizziness and shortness of breath.

## 2022-03-25 NOTE — Telephone Encounter (Signed)
STAT if HR is under 50 or over 120 (normal HR is 60-100 beats per minute)  What is your heart rate? 93  Do you have a log of your heart rate readings (document readings)? 47, 49  Do you have any other symptoms? Dentist put her on Amoxicillin '500mg'$  3x's daily drop her HR 47. Felt a little bit dizzy after she took it. She think this is what is causing it.  BP 152/81

## 2022-03-25 NOTE — Telephone Encounter (Signed)
Rn returned call to patient and provided her the following recommendations. Patient states " I will take another and just see for myself" Patient again advised of Dr. Judeth Cornfield recommendations.         Per Dr. Harrell Gave- "I reviewed the comments and her prior chart. Her heart rate has been in the 50s-60s routinely, and I do not think that the pulse of 47 is the likely cause of her symptoms. The last set of vitals is actually perfect. There is no reason from a cardiac perspective that the amoxicillin should cause problems, but I would recommend discussing with her dentist if she had a reaction or some type of sensitivity to the medication that caused dizziness and shortness of breath."

## 2022-03-25 NOTE — Telephone Encounter (Signed)
RN returned call to patient to discuss medication concerns,    Patient states she was at the dentist yesterday prepping for dental surgery, one tooth and gum swollen. Was given Amoxicillin '500mg'$  M6/ day. Took her medications this morning, pulse was 47. She noticed a little dizziness, with some shortness of breath. Took BP and it was elevated at 152/81. Then took it again 20 mins again 133/72 P 51  Advised patient to call the dental office and she states that she already did and was advised to call us.   Routing to Dr. Harrell Gave for advisement

## 2022-03-27 ENCOUNTER — Other Ambulatory Visit: Payer: Self-pay | Admitting: Cardiology

## 2022-03-28 NOTE — Telephone Encounter (Signed)
Rx(s) sent to pharmacy electronically.  

## 2022-04-20 ENCOUNTER — Ambulatory Visit: Payer: Medicare HMO

## 2022-04-20 DIAGNOSIS — Z86711 Personal history of pulmonary embolism: Secondary | ICD-10-CM | POA: Diagnosis not present

## 2022-04-20 DIAGNOSIS — Z7901 Long term (current) use of anticoagulants: Secondary | ICD-10-CM | POA: Diagnosis not present

## 2022-04-20 DIAGNOSIS — Z5181 Encounter for therapeutic drug level monitoring: Secondary | ICD-10-CM | POA: Diagnosis not present

## 2022-04-20 LAB — POCT INR: INR: 3.4 — AB (ref 2.0–3.0)

## 2022-04-20 NOTE — Patient Instructions (Signed)
Description   Hold today's dose and then continue taking warfarin 1 tablet daily except for 1/2 tablet on Monday, Wednesday, and Friday.  Recheck INR in 5 weeks.  Be consistent with Boost (2-3 per week).  [facility] [phone].

## 2022-05-25 ENCOUNTER — Ambulatory Visit: Payer: Medicare HMO

## 2022-05-25 DIAGNOSIS — Z5181 Encounter for therapeutic drug level monitoring: Secondary | ICD-10-CM

## 2022-05-25 DIAGNOSIS — I4891 Unspecified atrial fibrillation: Secondary | ICD-10-CM | POA: Diagnosis not present

## 2022-05-25 LAB — POCT INR: INR: 3.1 — AB (ref 2.0–3.0)

## 2022-05-25 NOTE — Patient Instructions (Addendum)
Description   Drink boost today and continue taking warfarin 1 tablet daily except for 1/2 tablet on Monday, Wednesday, and Friday.  Recheck INR in 5 weeks.  Be consistent with Boost (2-3 per week).  [facility] [phone].

## 2022-06-29 ENCOUNTER — Ambulatory Visit: Payer: Medicare HMO

## 2022-06-29 DIAGNOSIS — Z86711 Personal history of pulmonary embolism: Secondary | ICD-10-CM

## 2022-06-29 DIAGNOSIS — Z5181 Encounter for therapeutic drug level monitoring: Secondary | ICD-10-CM

## 2022-06-29 DIAGNOSIS — Z7901 Long term (current) use of anticoagulants: Secondary | ICD-10-CM

## 2022-06-29 DIAGNOSIS — I2782 Chronic pulmonary embolism: Secondary | ICD-10-CM

## 2022-06-29 LAB — POCT INR: INR: 3.2 — AB (ref 2.0–3.0)

## 2022-06-29 NOTE — Patient Instructions (Signed)
Description   Eat greens today and START taking warfarin 1 tablet daily except for 1/2 tablet on Monday, Wednesday, Friday and Saturdays.  Recheck INR in 2 weeks.  Be consistent with Boost (2-3 per week).  [facility] [phone].

## 2022-07-04 ENCOUNTER — Telehealth: Payer: Self-pay | Admitting: *Deleted

## 2022-07-04 NOTE — Telephone Encounter (Signed)
Pt left a voicemail stating to call as she had mised up her warfarin dose. She took a whole pill of warfarin on Saturday instead of a 1/2 pill and did not have her regular boost on Sunday. Advised to take 1/2 pill of warfarin on Tuesday then resume normal and resume normal boost as well. She verbalized understanding.

## 2022-07-14 ENCOUNTER — Ambulatory Visit: Payer: Medicare HMO | Attending: Interventional Cardiology

## 2022-07-14 DIAGNOSIS — Z5181 Encounter for therapeutic drug level monitoring: Secondary | ICD-10-CM | POA: Diagnosis not present

## 2022-07-14 LAB — POCT INR: INR: 2.3 (ref 2.0–3.0)

## 2022-07-14 NOTE — Patient Instructions (Signed)
Description   Continue taking warfarin 1 tablet daily except for 1/2 tablet on Monday, Wednesday, Friday and Saturdays.  Recheck INR in 3 weeks.  Be consistent with Boost (2 per week).  [facility]in Clinic [phone].

## 2022-07-21 ENCOUNTER — Telehealth: Payer: Self-pay | Admitting: *Deleted

## 2022-07-21 NOTE — Telephone Encounter (Signed)
Pt called since she missed last night's dose of warfarin, advised to take an extra 1/2 tablet tonight then resume normal dose of warfarin. Pt confirmed she will take 1.5 tablets today of warfarin then keep continue her normal dose of warfarin. She will call back if anything else is needed.

## 2022-07-28 ENCOUNTER — Other Ambulatory Visit: Payer: Self-pay | Admitting: Cardiology

## 2022-07-28 DIAGNOSIS — I480 Paroxysmal atrial fibrillation: Secondary | ICD-10-CM

## 2022-07-28 NOTE — Telephone Encounter (Signed)
Please review for refill. Thank you! 

## 2022-07-28 NOTE — Telephone Encounter (Signed)
Prescription refill request received for warfarin Lov: 12/31/21 Debbie [name])  Next INR check: 08/08/22 Warfarin tablet strength: '5mg'$   Appropriate dose and refill sent to requested pharmacy.

## 2022-08-08 ENCOUNTER — Ambulatory Visit: Payer: Medicare HMO | Attending: Cardiovascular Disease | Admitting: *Deleted

## 2022-08-08 DIAGNOSIS — Z86711 Personal history of pulmonary embolism: Secondary | ICD-10-CM | POA: Diagnosis not present

## 2022-08-08 DIAGNOSIS — I2782 Chronic pulmonary embolism: Secondary | ICD-10-CM | POA: Diagnosis not present

## 2022-08-08 DIAGNOSIS — Z7901 Long term (current) use of anticoagulants: Secondary | ICD-10-CM

## 2022-08-08 DIAGNOSIS — Z5181 Encounter for therapeutic drug level monitoring: Secondary | ICD-10-CM

## 2022-08-08 LAB — POCT INR: INR: 1.8 — AB (ref 2.0–3.0)

## 2022-08-08 NOTE — Patient Instructions (Signed)
Description   Today take 1 tablet then continue taking warfarin 1/2 tablet daily except for 1 tablet on Sunday, Tuesday, and Thursday. Recheck INR in 3 weeks. Be consistent with Boost (2 per week). [facility] [phone] or [phone].

## 2022-08-29 ENCOUNTER — Ambulatory Visit: Payer: Medicare HMO | Attending: Interventional Cardiology

## 2022-08-29 DIAGNOSIS — Z5181 Encounter for therapeutic drug level monitoring: Secondary | ICD-10-CM | POA: Diagnosis not present

## 2022-08-29 LAB — POCT INR: INR: 2.5 (ref 2.0–3.0)

## 2022-08-29 NOTE — Patient Instructions (Signed)
Description   Continue taking warfarin 1/2 tablet daily except for 1 tablet on Sunday, Tuesday, and Thursday. Recheck INR in 4 weeks. Be consistent with Boost (2 per week). [facility] [phone].

## 2022-09-26 ENCOUNTER — Ambulatory Visit: Payer: Medicare HMO | Attending: Cardiology | Admitting: *Deleted

## 2022-09-26 DIAGNOSIS — Z86711 Personal history of pulmonary embolism: Secondary | ICD-10-CM

## 2022-09-26 DIAGNOSIS — Z5181 Encounter for therapeutic drug level monitoring: Secondary | ICD-10-CM

## 2022-09-26 DIAGNOSIS — I2782 Chronic pulmonary embolism: Secondary | ICD-10-CM

## 2022-09-26 DIAGNOSIS — Z7901 Long term (current) use of anticoagulants: Secondary | ICD-10-CM

## 2022-09-26 LAB — POCT INR: INR: 1.9 — AB (ref 2.0–3.0)

## 2022-09-26 NOTE — Patient Instructions (Signed)
Description   Today take 1 tablet of warfarin then continue taking warfarin 1/2 tablet daily except for 1 tablet on Sunday, Tuesday, and Thursday. Recheck INR in 4 weeks. Be consistent with Boost (2 per week). [facility] [phone].

## 2022-10-24 ENCOUNTER — Ambulatory Visit: Payer: Medicare HMO | Attending: Cardiology

## 2022-10-24 DIAGNOSIS — Z5181 Encounter for therapeutic drug level monitoring: Secondary | ICD-10-CM | POA: Diagnosis not present

## 2022-10-24 LAB — POCT INR: INR: 2.5 (ref 2.0–3.0)

## 2022-10-24 NOTE — Patient Instructions (Signed)
Description   Continue taking warfarin 1/2 tablet daily except for 1 tablet on Sunday, Tuesday, and Thursday. Recheck INR in 5 weeks.  Be consistent with Boost (2 per week).  [facility] [phone].

## 2022-11-23 ENCOUNTER — Emergency Department (HOSPITAL_BASED_OUTPATIENT_CLINIC_OR_DEPARTMENT_OTHER): Payer: Medicare HMO | Admitting: Radiology

## 2022-11-23 ENCOUNTER — Emergency Department (HOSPITAL_BASED_OUTPATIENT_CLINIC_OR_DEPARTMENT_OTHER)
Admission: EM | Admit: 2022-11-23 | Discharge: 2022-11-23 | Disposition: A | Discharge status: Home / Self Care | Payer: Medicare HMO | Attending: Emergency Medicine | Admitting: Emergency Medicine

## 2022-11-23 ENCOUNTER — Other Ambulatory Visit: Payer: Self-pay

## 2022-11-23 ENCOUNTER — Encounter (HOSPITAL_BASED_OUTPATIENT_CLINIC_OR_DEPARTMENT_OTHER): Payer: Self-pay | Admitting: Emergency Medicine

## 2022-11-23 ENCOUNTER — Emergency Department (HOSPITAL_BASED_OUTPATIENT_CLINIC_OR_DEPARTMENT_OTHER): Payer: Medicare HMO

## 2022-11-23 DIAGNOSIS — M79662 Pain in left lower leg: Secondary | ICD-10-CM | POA: Insufficient documentation

## 2022-11-23 DIAGNOSIS — I10 Essential (primary) hypertension: Secondary | ICD-10-CM | POA: Insufficient documentation

## 2022-11-23 DIAGNOSIS — R791 Abnormal coagulation profile: Secondary | ICD-10-CM | POA: Insufficient documentation

## 2022-11-23 DIAGNOSIS — Z7902 Long term (current) use of antithrombotics/antiplatelets: Secondary | ICD-10-CM | POA: Insufficient documentation

## 2022-11-23 DIAGNOSIS — I480 Paroxysmal atrial fibrillation: Secondary | ICD-10-CM | POA: Insufficient documentation

## 2022-11-23 DIAGNOSIS — R2242 Localized swelling, mass and lump, left lower limb: Secondary | ICD-10-CM | POA: Insufficient documentation

## 2022-11-23 DIAGNOSIS — R1030 Lower abdominal pain, unspecified: Secondary | ICD-10-CM | POA: Insufficient documentation

## 2022-11-23 DIAGNOSIS — M79605 Pain in left leg: Secondary | ICD-10-CM

## 2022-11-23 DIAGNOSIS — Z853 Personal history of malignant neoplasm of breast: Secondary | ICD-10-CM | POA: Insufficient documentation

## 2022-11-23 LAB — CBC WITH DIFFERENTIAL/PLATELET
Abs Immature Granulocytes: 0.01 10*3/uL (ref 0.00–0.07)
Basophils Absolute: 0 10*3/uL (ref 0.0–0.1)
Basophils Relative: 1 %
Eosinophils Absolute: 0.1 10*3/uL (ref 0.0–0.5)
Eosinophils Relative: 2 %
HCT: 42 % (ref 36.0–46.0)
Hemoglobin: 13.4 g/dL (ref 12.0–15.0)
Immature Granulocytes: 0 %
Lymphocytes Relative: 33 %
Lymphs Abs: 1.8 10*3/uL (ref 0.7–4.0)
MCH: 30.9 pg (ref 26.0–34.0)
MCHC: 31.9 g/dL (ref 30.0–36.0)
MCV: 96.8 fL (ref 80.0–100.0)
Monocytes Absolute: 0.4 10*3/uL (ref 0.1–1.0)
Monocytes Relative: 7 %
Neutro Abs: 3 10*3/uL (ref 1.7–7.7)
Neutrophils Relative %: 57 %
Platelets: 116 10*3/uL — ABNORMAL LOW (ref 150–400)
RBC: 4.34 MIL/uL (ref 3.87–5.11)
RDW: 13.9 % (ref 11.5–15.5)
WBC: 5.3 10*3/uL (ref 4.0–10.5)
nRBC: 0 % (ref 0.0–0.2)

## 2022-11-23 LAB — COMPREHENSIVE METABOLIC PANEL
ALT: 12 U/L (ref 0–44)
AST: 21 U/L (ref 15–41)
Albumin: 4.2 g/dL (ref 3.5–5.0)
Alkaline Phosphatase: 54 U/L (ref 38–126)
Anion gap: 9 (ref 5–15)
BUN: 10 mg/dL (ref 8–23)
CO2: 29 mmol/L (ref 22–32)
Calcium: 9.6 mg/dL (ref 8.9–10.3)
Chloride: 102 mmol/L (ref 98–111)
Creatinine, Ser: 0.79 mg/dL (ref 0.44–1.00)
GFR, Estimated: >60 mL/min (ref >60)
Glucose, Bld: 89 mg/dL (ref 70–99)
Potassium: 4.3 mmol/L (ref 3.5–5.1)
Sodium: 140 mmol/L (ref 135–145)
Total Bilirubin: 0.6 mg/dL (ref 0.3–1.2)
Total Protein: 7.3 g/dL (ref 6.5–8.1)

## 2022-11-23 LAB — PROTIME-INR
INR: 2.2 — ABNORMAL HIGH (ref 0.8–1.2)
Prothrombin Time: 24.1 seconds — ABNORMAL HIGH (ref 11.4–15.2)

## 2022-11-23 NOTE — ED Triage Notes (Signed)
Pt arrives to ED with c/o left leg/calf swelling and pain that started last night. She also notes left groin pain.

## 2022-11-23 NOTE — ED Provider Notes (Signed)
[facility] EMERGENCY DEPT Provider Note   CSN: [id number] Arrival date & time: 11/23/22  1222     History  Chief Complaint  Patient presents with   Leg Swelling    Debbie [name] is a 77 y.o. female.  HPI   77 year old female presents emergency department with complaints of left lower leg swelling, knot-like formation on left calf as well as left groin pain..  Patient states that she noticed symptoms around 6 PM last night.  Patient on warfarin secondary to paroxysmal atrial fibrillation and states she has been compliant with her medicines.  Was seen at physical therapy earlier today and told to come the emergency department for further assessment.  Denies chest pain, shortness of breath, fever, chills, night sweats, known falls/traumas.  Past medical history significant for atrial fibrillation on warfarin, fibromyalgia, chronic pain on opioid medication, dyslipidemia, breast cancer, OSA, hypertension, obesity  Home Medications Prior to Admission medications   Medication Sig Start Date End Date Taking? Authorizing Provider  acetaminophen (TYLENOL) 500 MG tablet Take 500 mg by mouth in the morning and at bedtime.    [provider]  amiodarone (PACERONE) 100 MG tablet Take 1 tablet (100 mg total) by mouth daily. 03/26/21   Buford Dresser, MD  amiodarone (PACERONE) 200 MG tablet TAKE ONE TABLET BY MOUTH DAILY (TO START AFTER THE 28 DAY DOSE) 03/28/22   Skeet Latch, MD  Ascorbic Acid (VITAMIN C PO) Take 282 mg by mouth daily.     [provider]  calcium carbonate (OS-CAL) 600 MG TABS tablet Take 600 mg by mouth daily with breakfast. With vitamin D 800 units    [provider]  gabapentin (NEURONTIN) 600 MG tablet Take 600 mg by mouth 2 (two) times daily.    [provider]  HYDROcodone-acetaminophen (NORCO/VICODIN) 5-325 MG tablet Take 1 tablet by mouth 3 (three) times daily as needed for moderate pain.  03/07/17   [provider]  lamoTRIgine (LAMICTAL) 25 MG tablet Take 1 tablet (25 mg total) by mouth 2 (two) times daily. 07/06/20   Suzzanne Cloud, NP  LEVALBUTEROL HCL IN Inhale into the lungs as needed.     [provider]  Multiple Vitamins-Minerals (ZINC PO) Take 50 mg by mouth daily.     [provider]  polyethylene glycol (MIRALAX / GLYCOLAX) packet Take 17 g by mouth as needed.    [provider]  promethazine (PHENERGAN) 25 MG tablet Take 6.25 mg by mouth every 6 (six) hours as needed for nausea or vomiting.    [provider]  vitamin B-12 (CYANOCOBALAMIN) 1000 MCG tablet Take 1,000 mcg by mouth daily.    [provider]  Vitamin D, Ergocalciferol, (DRISDOL) 1.25 MG (50000 UNIT) CAPS capsule TAKE 1 CAPSULE BY MOUTH EVERY 10 DAYS 09/28/20   [name], [name], MD  warfarin (COUMADIN) 5 MG tablet TAKE 1/2 to 1 TABLET BY MOUTH DAILY AS DIRECTED BY ANTICOAGULATION CLINIC 07/28/22   Buford Dresser, MD      Allergies    Diclofenac, Atorvastatin, Tape, Albuterol, Lasix [furosemide], Mushroom extract complex, Tramadol, Buprenorphine hcl, Codeine, Fish oil, and Morphine and related    Review of Systems   Review of Systems  All other systems reviewed and are negative.   Physical Exam Updated Vital Signs BP (!) 142/94   Pulse (!) 54   Temp 97.9 F (36.6 C) (Oral)   Resp 18   Ht '5\' 8"'$  (1.727 m)   Wt 102.5 kg  SpO2 95%   BMI 34.36 kg/m  Physical Exam Vitals and nursing note reviewed.  Constitutional:      General: She is not in acute distress.    Appearance: She is well-developed.  HENT:     Head: Normocephalic and atraumatic.  Eyes:     Conjunctiva/sclera: Conjunctivae normal.  Cardiovascular:     Rate and Rhythm: Normal rate and regular rhythm.     Heart sounds: No murmur heard. Pulmonary:     Effort: Pulmonary effort is normal. No respiratory distress.     Breath sounds: Normal breath sounds.  Abdominal:     Palpations: Abdomen is  soft.     Tenderness: There is no abdominal tenderness.  Musculoskeletal:        General: No swelling.     Cervical back: Neck supple.     Comments: No obvious increase in lower extremity edema when compared bilaterally.  Patient with 0-1+ pitting edema bilaterally.  Small ecchymotic region noted posterior left calf where pain and not was felt to be palpated.  Pedal pulses full and symmetric bilaterally.  Patient with symmetric strength in hip flexion/extension, knee flexion/extension, ankle dorsi/plantarflexion.  Skin:    General: Skin is warm and dry.     Capillary Refill: Capillary refill takes less than 2 seconds.  Neurological:     Mental Status: She is alert.  Psychiatric:        Mood and Affect: Mood normal.     ED Results / Procedures / Treatments   Labs (all labs ordered are listed, but only abnormal results are displayed) Labs Reviewed  CBC WITH DIFFERENTIAL/PLATELET - Abnormal; Notable for the following components:      Result Value   Platelets 116 (*)    All other components within normal limits  PROTIME-INR - Abnormal; Notable for the following components:   Prothrombin Time 24.1 (*)    INR 2.2 (*)    All other components within normal limits  COMPREHENSIVE METABOLIC PANEL    EKG None  Radiology DG Hip Unilat W or Wo Pelvis 2-3 Views Left  Result Date: 11/23/2022 CLINICAL DATA:  Pain.  Left leg and calf swelling. EXAM: DG HIP (WITH OR WITHOUT PELVIS) 2-3V LEFT COMPARISON:  MR pelvis 12/05/2018 FINDINGS: Left hip appears intact and located. No signs of pelvic diastasis. Degenerative changes noted within the lumbar spine. IMPRESSION: 1. No acute findings. 2. Lumbar degenerative disc disease. Electronically Signed   By: Kerby Moors M.D.   On: 11/23/2022 15:07   DG Knee Complete 4 Views Left  Result Date: 11/23/2022 CLINICAL DATA:  Pain. Left leg and calf swelling and pain starting last night. EXAM: LEFT KNEE - COMPLETE 4+ VIEW COMPARISON:  09/14/2017 FINDINGS:  Degenerative changes in the left knee with medial greater than lateral compartment narrowing and small osteophyte formation. Meniscal cartilage calcifications. No evidence of acute fracture or dislocation. No significant effusions. Soft tissues are unremarkable. IMPRESSION: Moderate degenerative changes in the left knee with chondrocalcinosis. No acute fractures. Electronically Signed   By: Lucienne Capers M.D.   On: 11/23/2022 15:07   US Venous Img Lower  Left (DVT Study)  Result Date: 11/23/2022 CLINICAL DATA:  LEFT LEG PAIN AND EDEMA EXAM: LEFT LOWER EXTREMITY VENOUS DOPPLER ULTRASOUND TECHNIQUE: Gray-scale sonography with graded compression, as well as color Doppler and duplex ultrasound were performed to evaluate the lower extremity deep venous systems from the level of the common femoral vein and including the common femoral, femoral, profunda femoral, popliteal and calf veins  including the posterior tibial, peroneal and gastrocnemius veins when visible. Spectral Doppler was utilized to evaluate flow at rest and with distal augmentation maneuvers in the common femoral, femoral and popliteal veins. COMPARISON:  None Available. FINDINGS: Contralateral Common Femoral Vein: Respiratory phasicity is normal and symmetric with the symptomatic side. No evidence of thrombus. Normal compressibility. Common Femoral Vein: No evidence of thrombus. Normal compressibility, respiratory phasicity and response to augmentation. Saphenofemoral Junction: No evidence of thrombus. Normal compressibility and flow on color Doppler imaging. Profunda Femoral Vein: No evidence of thrombus. Normal compressibility and flow on color Doppler imaging. Femoral Vein: No evidence of thrombus. Normal compressibility, respiratory phasicity and response to augmentation. Popliteal Vein: No evidence of thrombus. Normal compressibility, respiratory phasicity and response to augmentation. Calf Veins: No evidence of thrombus. Normal compressibility  and flow on color Doppler imaging. Superficial Great Saphenous Vein: No evidence of thrombus. Normal compressibility. IMPRESSION: No evidence of deep venous thrombosis. Electronically Signed   By: Jerilynn Mages.  Shick M.D.   On: 11/23/2022 13:51    Procedures Procedures    Medications Ordered in ED Medications - No data to display  ED Course/ Medical Decision Making/ A&P                             Medical Decision Making Amount and/or Complexity of Data Reviewed Labs: ordered. Radiology: ordered.   This patient presents to the ED for concern of leg pain, this involves an extensive number of treatment options, and is a complaint that carries with it a high risk of complications and morbidity.  The differential diagnosis includes fracture, strain/sprain, dislocation, DVT, PAD, compartment syndrome, osteoarthritis, rheumatoid arthritis   Co morbidities that complicate the patient evaluation  See HPI   Additional history obtained:  Additional history obtained from EMR External records from outside source obtained and reviewed including hospital records   Lab Tests:  I Ordered, and personally interpreted labs.  The pertinent results include: No leukocytosis noted.  No evidence of anemia.  Decrease in platelets of 116.  No electrolyte abnormality appreciated.  No transaminitis.  No renal dysfunction.  INR in therapeutic range of 2.2 given the patient is currently anticoagulated on Coumadin for atrial fibrillation   Imaging Studies ordered:  I ordered imaging studies including left lower extremity DVT study, left hip with pelvis, left knee x-rays I independently visualized and interpreted imaging which showed  Left lower extremity DVT study: No DVT Pelvis with left hip x-ray: No acute findings.  Lumbar degenerative disc disease. Left knee x-ray: Moderate degenerative changes left knee with chondrocalcinosis.  No acute fracture. I agree with the radiologist interpretation  Cardiac  Monitoring: / EKG:  The patient was maintained on a cardiac monitor.  I personally viewed and interpreted the cardiac monitored which showed an underlying rhythm of: Sinus rhythm   Consultations Obtained:  N/a   Problem List / ED Course / Critical interventions / Medication management  Left leg pain Reevaluation of the patient showed that the patient stayed the same I have reviewed the patients home medicines and have made adjustments as needed   Social Determinants of Health:  Denies tobacco, licit use.   Test / Admission - Considered:  Left leg pain Vitals signs significant for mild hypertension with a blood pressure 142/94.  Recommend follow-up with primary care regarding elevation blood pressure.. Otherwise within normal range and stable throughout visit. Laboratory/imaging studies significant for: See above Patient without evidence of DVT, acute  fracture/dislocation.  Patient with 2+ pedal pulses bilaterally with no signs of acute bony trauma.  Compartments soft and supple so low suspicion for compartment syndrome.  Patient without evidence of cellulitic skin changes, abscess formation or indurated skin.  Very low suspicion for septic arthritis/cellulitis/erysipelas/necrotizing fasciitis.  Patient with evidence of significant amounts of osteoarthritis of the left knee with chondrocalcinosis of which is known by patient.  Recommend follow-up with orthopedics outpatient for reevaluation of affected knee.  Patient's area of tenderness in left calf seems more muscular related in nature.  Recommend rest, ice, elevation, Tylenol for pain.  Treatment plan discussed at length with patient and she acknowledged understanding was agreeable to said plan. Worrisome signs and symptoms were discussed with the patient, and the patient acknowledged understanding to return to the ED if noticed. Patient was stable upon discharge.         Final Clinical Impression(s) / ED Diagnoses Final  diagnoses:  Pain of left lower extremity    Rx / DC Orders ED Discharge Orders     None         Wilnette Kales, [place] 11/23/22 [place], [place], DO 11/24/22 [phone]

## 2022-11-23 NOTE — Discharge Instructions (Addendum)
Note the workup today was overall reassuring.  DVT study was negative for any blood clot in your leg.  Recommend elevating affected leg above the level of your heart to help decrease swelling.  Take Motrin/Tylenol as needed for pain.  Recommend follow-up with orthopedics; call number attached your discharge papers..  Please do not hesitate to return to emergency department for worrisome signs and symptoms we discussed become apparent.

## 2022-11-24 ENCOUNTER — Ambulatory Visit: Payer: Self-pay | Admitting: *Deleted

## 2022-11-24 DIAGNOSIS — Z7901 Long term (current) use of anticoagulants: Secondary | ICD-10-CM

## 2022-11-24 DIAGNOSIS — Z5181 Encounter for therapeutic drug level monitoring: Secondary | ICD-10-CM

## 2022-11-24 DIAGNOSIS — I2782 Chronic pulmonary embolism: Secondary | ICD-10-CM

## 2022-11-24 DIAGNOSIS — Z86711 Personal history of pulmonary embolism: Secondary | ICD-10-CM

## 2022-11-24 NOTE — Patient Instructions (Signed)
Description   Spoke with pt and advised to continue taking warfarin 1/2 tablet daily except for 1 tablet on Sunday, Tuesday, and Thursday. Recheck INR in 5 weeks. Be consistent with Boost (2 per week). [facility] [phone].

## 2022-12-26 ENCOUNTER — Other Ambulatory Visit: Payer: Self-pay

## 2022-12-26 MED ORDER — AMIODARONE HCL 200 MG PO TABS: ORAL_TABLET | ORAL | 1 refills | Status: DC

## 2022-12-29 ENCOUNTER — Ambulatory Visit: Payer: Medicare HMO | Attending: Cardiology | Admitting: *Deleted

## 2022-12-29 DIAGNOSIS — I2782 Chronic pulmonary embolism: Secondary | ICD-10-CM | POA: Diagnosis not present

## 2022-12-29 DIAGNOSIS — Z7901 Long term (current) use of anticoagulants: Secondary | ICD-10-CM

## 2022-12-29 DIAGNOSIS — Z86711 Personal history of pulmonary embolism: Secondary | ICD-10-CM

## 2022-12-29 DIAGNOSIS — Z5181 Encounter for therapeutic drug level monitoring: Secondary | ICD-10-CM | POA: Diagnosis not present

## 2022-12-29 LAB — POCT INR: INR: 2.4 (ref 2.0–3.0)

## 2022-12-29 NOTE — Patient Instructions (Signed)
Description   Continue taking warfarin 1/2 tablet daily except for 1 tablet on Sunday, Tuesday, and Thursday. Recheck INR in 6 weeks. Be consistent with Boost (2 per week). [facility] [phone].

## 2023-01-09 ENCOUNTER — Emergency Department (HOSPITAL_COMMUNITY): Payer: Medicare HMO

## 2023-01-09 ENCOUNTER — Inpatient Hospital Stay (HOSPITAL_COMMUNITY)
Admission: EM | Admit: 2023-01-09 | Discharge: 2023-01-12 | DRG: 391 | Disposition: A | Discharge status: Home Health | Payer: Medicare HMO | Attending: Internal Medicine | Admitting: Internal Medicine

## 2023-01-09 ENCOUNTER — Other Ambulatory Visit: Payer: Self-pay

## 2023-01-09 ENCOUNTER — Encounter (HOSPITAL_COMMUNITY): Payer: Self-pay | Admitting: *Deleted

## 2023-01-09 DIAGNOSIS — Z1152 Encounter for screening for COVID-19: Secondary | ICD-10-CM

## 2023-01-09 DIAGNOSIS — Z885 Allergy status to narcotic agent status: Secondary | ICD-10-CM

## 2023-01-09 DIAGNOSIS — I119 Hypertensive heart disease without heart failure: Secondary | ICD-10-CM | POA: Diagnosis present

## 2023-01-09 DIAGNOSIS — R079 Chest pain, unspecified: Secondary | ICD-10-CM | POA: Insufficient documentation

## 2023-01-09 DIAGNOSIS — G250 Essential tremor: Secondary | ICD-10-CM | POA: Diagnosis present

## 2023-01-09 DIAGNOSIS — E785 Hyperlipidemia, unspecified: Secondary | ICD-10-CM | POA: Diagnosis present

## 2023-01-09 DIAGNOSIS — Z79899 Other long term (current) drug therapy: Secondary | ICD-10-CM

## 2023-01-09 DIAGNOSIS — R519 Headache, unspecified: Secondary | ICD-10-CM | POA: Insufficient documentation

## 2023-01-09 DIAGNOSIS — I2489 Other forms of acute ischemic heart disease: Secondary | ICD-10-CM | POA: Diagnosis present

## 2023-01-09 DIAGNOSIS — M797 Fibromyalgia: Secondary | ICD-10-CM | POA: Diagnosis present

## 2023-01-09 DIAGNOSIS — E669 Obesity, unspecified: Secondary | ICD-10-CM | POA: Diagnosis present

## 2023-01-09 DIAGNOSIS — Z823 Family history of stroke: Secondary | ICD-10-CM

## 2023-01-09 DIAGNOSIS — R112 Nausea with vomiting, unspecified: Secondary | ICD-10-CM | POA: Diagnosis not present

## 2023-01-09 DIAGNOSIS — F419 Anxiety disorder, unspecified: Secondary | ICD-10-CM | POA: Diagnosis present

## 2023-01-09 DIAGNOSIS — I4819 Other persistent atrial fibrillation: Secondary | ICD-10-CM | POA: Diagnosis present

## 2023-01-09 DIAGNOSIS — D6959 Other secondary thrombocytopenia: Secondary | ICD-10-CM | POA: Diagnosis present

## 2023-01-09 DIAGNOSIS — Z90710 Acquired absence of both cervix and uterus: Secondary | ICD-10-CM

## 2023-01-09 DIAGNOSIS — Z86711 Personal history of pulmonary embolism: Secondary | ICD-10-CM | POA: Diagnosis not present

## 2023-01-09 DIAGNOSIS — I10 Essential (primary) hypertension: Secondary | ICD-10-CM | POA: Insufficient documentation

## 2023-01-09 DIAGNOSIS — Z66 Do not resuscitate: Secondary | ICD-10-CM | POA: Diagnosis present

## 2023-01-09 DIAGNOSIS — Z85828 Personal history of other malignant neoplasm of skin: Secondary | ICD-10-CM | POA: Diagnosis not present

## 2023-01-09 DIAGNOSIS — G40909 Epilepsy, unspecified, not intractable, without status epilepticus: Secondary | ICD-10-CM | POA: Diagnosis present

## 2023-01-09 DIAGNOSIS — F32A Depression, unspecified: Secondary | ICD-10-CM | POA: Diagnosis present

## 2023-01-09 DIAGNOSIS — Z888 Allergy status to other drugs, medicaments and biological substances status: Secondary | ICD-10-CM

## 2023-01-09 DIAGNOSIS — I480 Paroxysmal atrial fibrillation: Secondary | ICD-10-CM | POA: Diagnosis not present

## 2023-01-09 DIAGNOSIS — Z8261 Family history of arthritis: Secondary | ICD-10-CM

## 2023-01-09 DIAGNOSIS — Z6834 Body mass index (BMI) 34.0-34.9, adult: Secondary | ICD-10-CM

## 2023-01-09 DIAGNOSIS — G4733 Obstructive sleep apnea (adult) (pediatric): Secondary | ICD-10-CM | POA: Diagnosis present

## 2023-01-09 DIAGNOSIS — R109 Unspecified abdominal pain: Secondary | ICD-10-CM

## 2023-01-09 DIAGNOSIS — K5732 Diverticulitis of large intestine without perforation or abscess without bleeding: Principal | ICD-10-CM | POA: Diagnosis present

## 2023-01-09 DIAGNOSIS — Z83438 Family history of other disorder of lipoprotein metabolism and other lipidemia: Secondary | ICD-10-CM

## 2023-01-09 DIAGNOSIS — Z853 Personal history of malignant neoplasm of breast: Secondary | ICD-10-CM

## 2023-01-09 DIAGNOSIS — R1084 Generalized abdominal pain: Secondary | ICD-10-CM | POA: Diagnosis not present

## 2023-01-09 DIAGNOSIS — Z7901 Long term (current) use of anticoagulants: Secondary | ICD-10-CM

## 2023-01-09 DIAGNOSIS — G43909 Migraine, unspecified, not intractable, without status migrainosus: Secondary | ICD-10-CM | POA: Diagnosis present

## 2023-01-09 DIAGNOSIS — N189 Chronic kidney disease, unspecified: Secondary | ICD-10-CM | POA: Diagnosis present

## 2023-01-09 DIAGNOSIS — Z803 Family history of malignant neoplasm of breast: Secondary | ICD-10-CM

## 2023-01-09 DIAGNOSIS — Z8249 Family history of ischemic heart disease and other diseases of the circulatory system: Secondary | ICD-10-CM

## 2023-01-09 DIAGNOSIS — I440 Atrioventricular block, first degree: Secondary | ICD-10-CM | POA: Diagnosis present

## 2023-01-09 DIAGNOSIS — K5792 Diverticulitis of intestine, part unspecified, without perforation or abscess without bleeding: Secondary | ICD-10-CM | POA: Diagnosis present

## 2023-01-09 DIAGNOSIS — J189 Pneumonia, unspecified organism: Secondary | ICD-10-CM | POA: Diagnosis present

## 2023-01-09 DIAGNOSIS — Z800 Family history of malignant neoplasm of digestive organs: Secondary | ICD-10-CM

## 2023-01-09 DIAGNOSIS — R197 Diarrhea, unspecified: Secondary | ICD-10-CM

## 2023-01-09 DIAGNOSIS — G8929 Other chronic pain: Secondary | ICD-10-CM | POA: Insufficient documentation

## 2023-01-09 DIAGNOSIS — Z833 Family history of diabetes mellitus: Secondary | ICD-10-CM

## 2023-01-09 LAB — COMPREHENSIVE METABOLIC PANEL
ALT: 17 U/L (ref 0–44)
AST: 26 U/L (ref 15–41)
Albumin: 3.9 g/dL (ref 3.5–5.0)
Alkaline Phosphatase: 56 U/L (ref 38–126)
Anion gap: 14 (ref 5–15)
BUN: 11 mg/dL (ref 8–23)
CO2: 25 mmol/L (ref 22–32)
Calcium: 9.4 mg/dL (ref 8.9–10.3)
Chloride: 97 mmol/L — ABNORMAL LOW (ref 98–111)
Creatinine, Ser: 0.78 mg/dL (ref 0.44–1.00)
GFR, Estimated: >60 mL/min (ref >60)
Glucose, Bld: 153 mg/dL — ABNORMAL HIGH (ref 70–99)
Potassium: 3.8 mmol/L (ref 3.5–5.1)
Sodium: 136 mmol/L (ref 135–145)
Total Bilirubin: 1.2 mg/dL (ref 0.3–1.2)
Total Protein: 7.5 g/dL (ref 6.5–8.1)

## 2023-01-09 LAB — URINALYSIS, ROUTINE W REFLEX MICROSCOPIC
Bacteria, UA: NONE SEEN
Bilirubin Urine: NEGATIVE
Glucose, UA: NEGATIVE mg/dL
Hgb urine dipstick: NEGATIVE
Ketones, ur: NEGATIVE mg/dL
Leukocytes,Ua: NEGATIVE
Nitrite: NEGATIVE
Protein, ur: 100 mg/dL — AB
Specific Gravity, Urine: 1.018 (ref 1.005–1.030)
pH: 7 (ref 5.0–8.0)

## 2023-01-09 LAB — BRAIN NATRIURETIC PEPTIDE: B Natriuretic Peptide: 486.6 pg/mL — ABNORMAL HIGH (ref 0.0–100.0)

## 2023-01-09 LAB — CBC WITH DIFFERENTIAL/PLATELET
Abs Immature Granulocytes: 0.03 10*3/uL (ref 0.00–0.07)
Basophils Absolute: 0 10*3/uL (ref 0.0–0.1)
Basophils Relative: 0 %
Eosinophils Absolute: 0 10*3/uL (ref 0.0–0.5)
Eosinophils Relative: 0 %
HCT: 43.4 % (ref 36.0–46.0)
Hemoglobin: 14.5 g/dL (ref 12.0–15.0)
Immature Granulocytes: 0 %
Lymphocytes Relative: 8 %
Lymphs Abs: 0.7 10*3/uL (ref 0.7–4.0)
MCH: 31.6 pg (ref 26.0–34.0)
MCHC: 33.4 g/dL (ref 30.0–36.0)
MCV: 94.6 fL (ref 80.0–100.0)
Monocytes Absolute: 0.2 10*3/uL (ref 0.1–1.0)
Monocytes Relative: 2 %
Neutro Abs: 7.6 10*3/uL (ref 1.7–7.7)
Neutrophils Relative %: 90 %
Platelets: 112 10*3/uL — ABNORMAL LOW (ref 150–400)
RBC: 4.59 MIL/uL (ref 3.87–5.11)
RDW: 13.9 % (ref 11.5–15.5)
WBC: 8.6 10*3/uL (ref 4.0–10.5)
nRBC: 0 % (ref 0.0–0.2)

## 2023-01-09 LAB — GASTROINTESTINAL PANEL BY PCR, STOOL (REPLACES STOOL CULTURE)

## 2023-01-09 LAB — RESP PANEL BY RT-PCR (RSV, FLU A&B, COVID)  RVPGX2
Influenza A by PCR: NEGATIVE
Influenza B by PCR: NEGATIVE
Resp Syncytial Virus by PCR: NEGATIVE
SARS Coronavirus 2 by RT PCR: NEGATIVE

## 2023-01-09 LAB — CBC
HCT: 41.7 % (ref 36.0–46.0)
Hemoglobin: 14.1 g/dL (ref 12.0–15.0)
MCH: 31.6 pg (ref 26.0–34.0)
MCHC: 33.8 g/dL (ref 30.0–36.0)
MCV: 93.5 fL (ref 80.0–100.0)
Platelets: 132 10*3/uL — ABNORMAL LOW (ref 150–400)
RBC: 4.46 MIL/uL (ref 3.87–5.11)
RDW: 13.8 % (ref 11.5–15.5)
WBC: 10.8 10*3/uL — ABNORMAL HIGH (ref 4.0–10.5)
nRBC: 0 % (ref 0.0–0.2)

## 2023-01-09 LAB — BASIC METABOLIC PANEL
Anion gap: 15 (ref 5–15)
BUN: 9 mg/dL (ref 8–23)
CO2: 22 mmol/L (ref 22–32)
Calcium: 8.9 mg/dL (ref 8.9–10.3)
Chloride: 99 mmol/L (ref 98–111)
Creatinine, Ser: 0.85 mg/dL (ref 0.44–1.00)
GFR, Estimated: >60 mL/min (ref >60)
Glucose, Bld: 131 mg/dL — ABNORMAL HIGH (ref 70–99)
Potassium: 3.6 mmol/L (ref 3.5–5.1)
Sodium: 136 mmol/L (ref 135–145)

## 2023-01-09 LAB — PROCALCITONIN: Procalcitonin: <0.1 ng/mL

## 2023-01-09 LAB — TROPONIN I (HIGH SENSITIVITY)
Troponin I (High Sensitivity): 18 ng/L — ABNORMAL HIGH (ref <18)
Troponin I (High Sensitivity): 28 ng/L — ABNORMAL HIGH (ref <18)
Troponin I (High Sensitivity): 65 ng/L — ABNORMAL HIGH (ref <18)

## 2023-01-09 LAB — C DIFFICILE QUICK SCREEN W PCR REFLEX
C Diff antigen: NEGATIVE
C Diff interpretation: NOT DETECTED
C Diff toxin: NEGATIVE

## 2023-01-09 LAB — PROTIME-INR
INR: 1.6 — ABNORMAL HIGH (ref 0.8–1.2)
Prothrombin Time: 19 seconds — ABNORMAL HIGH (ref 11.4–15.2)

## 2023-01-09 LAB — TSH: TSH: 1.119 u[IU]/mL (ref 0.350–4.500)

## 2023-01-09 MED ORDER — ONDANSETRON HCL 4 MG/2ML IJ SOLN
4.0000 mg | Freq: Four times a day (QID) | INTRAMUSCULAR | Status: DC | PRN
  Administered 2023-01-09 (×2): 4 mg via INTRAVENOUS
  Filled 2023-01-09 (×2): qty 2

## 2023-01-09 MED ORDER — SODIUM CHLORIDE 0.9 % IV SOLN
6.2500 mg | Freq: Four times a day (QID) | INTRAVENOUS | Status: DC | PRN
  Filled 2023-01-09: qty 0.25

## 2023-01-09 MED ORDER — SODIUM CHLORIDE 0.9 % IV SOLN: 12.5000 mg | Freq: Four times a day (QID) | INTRAVENOUS | Status: DC | PRN

## 2023-01-09 MED ORDER — HYDRALAZINE HCL 20 MG/ML IJ SOLN: 5.0000 mg | Freq: Four times a day (QID) | INTRAMUSCULAR | Status: DC | PRN

## 2023-01-09 MED ORDER — LAMOTRIGINE 25 MG PO TABS
25.0000 mg | ORAL_TABLET | Freq: Two times a day (BID) | ORAL | Status: DC
  Administered 2023-01-09 – 2023-01-12 (×7): 25 mg via ORAL
  Filled 2023-01-09 (×7): qty 1

## 2023-01-09 MED ORDER — METRONIDAZOLE 500 MG/100ML IV SOLN: 500.0000 mg | Freq: Two times a day (BID) | INTRAVENOUS | Status: DC

## 2023-01-09 MED ORDER — ACETAMINOPHEN 325 MG PO TABS
650.0000 mg | ORAL_TABLET | Freq: Four times a day (QID) | ORAL | Status: DC | PRN
  Administered 2023-01-09: 650 mg via ORAL
  Filled 2023-01-09: qty 2

## 2023-01-09 MED ORDER — WARFARIN SODIUM 3 MG PO TABS
6.0000 mg | ORAL_TABLET | Freq: Once | ORAL | Status: AC
  Administered 2023-01-09: 6 mg via ORAL
  Filled 2023-01-09: qty 2

## 2023-01-09 MED ORDER — SODIUM CHLORIDE 0.9 % IV SOLN
1.0000 g | Freq: Once | INTRAVENOUS | Status: AC
  Administered 2023-01-09: 1 g via INTRAVENOUS
  Filled 2023-01-09: qty 10

## 2023-01-09 MED ORDER — WARFARIN - PHARMACIST DOSING INPATIENT: Freq: Every day | Status: DC

## 2023-01-09 MED ORDER — MAGNESIUM SULFATE 2 GM/50ML IV SOLN
2.0000 g | Freq: Once | INTRAVENOUS | Status: AC
  Administered 2023-01-09: 2 g via INTRAVENOUS
  Filled 2023-01-09: qty 50

## 2023-01-09 MED ORDER — AMIODARONE HCL 100 MG PO TABS
100.0000 mg | ORAL_TABLET | Freq: Every day | ORAL | Status: DC
  Administered 2023-01-09 – 2023-01-12 (×4): 100 mg via ORAL
  Filled 2023-01-09 (×4): qty 1

## 2023-01-09 MED ORDER — METRONIDAZOLE 500 MG/100ML IV SOLN
500.0000 mg | Freq: Once | INTRAVENOUS | Status: DC
  Filled 2023-01-09: qty 100

## 2023-01-09 MED ORDER — SODIUM CHLORIDE 0.9 % IV SOLN
500.0000 mg | Freq: Once | INTRAVENOUS | Status: AC
  Administered 2023-01-09: 500 mg via INTRAVENOUS
  Filled 2023-01-09: qty 5

## 2023-01-09 MED ORDER — SODIUM CHLORIDE 0.9 % IV SOLN: 1.0000 g | INTRAVENOUS | Status: DC

## 2023-01-09 MED ORDER — SODIUM CHLORIDE 0.9 % IV SOLN: INTRAVENOUS | Status: DC

## 2023-01-09 MED ORDER — PIPERACILLIN-TAZOBACTAM 3.375 G IVPB
3.3750 g | Freq: Three times a day (TID) | INTRAVENOUS | Status: DC
  Administered 2023-01-09 – 2023-01-11 (×9): 3.375 g via INTRAVENOUS
  Filled 2023-01-09 (×12): qty 50

## 2023-01-09 MED ORDER — ONDANSETRON HCL 4 MG/2ML IJ SOLN
4.0000 mg | Freq: Once | INTRAMUSCULAR | Status: AC
  Administered 2023-01-09: 4 mg via INTRAVENOUS
  Filled 2023-01-09: qty 2

## 2023-01-09 MED ORDER — SODIUM CHLORIDE 0.9 % IV SOLN
500.0000 mg | INTRAVENOUS | Status: AC
  Administered 2023-01-10 – 2023-01-11 (×2): 500 mg via INTRAVENOUS
  Filled 2023-01-09 (×2): qty 5

## 2023-01-09 MED ORDER — HYDROCODONE-ACETAMINOPHEN 7.5-325 MG PO TABS
1.0000 | ORAL_TABLET | Freq: Four times a day (QID) | ORAL | Status: DC | PRN
  Administered 2023-01-09 – 2023-01-12 (×10): 1 via ORAL
  Filled 2023-01-09 (×10): qty 1

## 2023-01-09 MED ORDER — ACETAMINOPHEN 500 MG PO TABS
1000.0000 mg | ORAL_TABLET | Freq: Once | ORAL | Status: AC
  Administered 2023-01-09: 1000 mg via ORAL
  Filled 2023-01-09: qty 2

## 2023-01-09 MED ORDER — ACETAMINOPHEN 650 MG RE SUPP: 650.0000 mg | Freq: Four times a day (QID) | RECTAL | Status: DC | PRN

## 2023-01-09 NOTE — ED Notes (Signed)
ED TO INPATIENT HANDOFF REPORT  ED Nurse Name and Phone #: [phone]  S Name/Age/Gender Debbie [name] 77 y.o. female Room/Bed: 034C/034C  Code Status   Code Status: Prior  Home/SNF/Other Home Patient oriented to: self, place, time, and situation Is this baseline? Yes   Triage Complete: Triage complete  Chief Complaint Acute diverticulitis [K57.92]  Triage Note The pt arrived by gems from home   nausea vomiting headache and diarrhea since yesterday  ems gave her zofran '4mg'$  iv  iv per ems   cbg 177   Allergies Allergies  Allergen Reactions   Diclofenac Nausea Only   Atorvastatin Other (See Comments)    Muscle pain   Tape Other (See Comments)    Peels skin off (bandaids, too) unknown   Albuterol Other (See Comments)    Afib   Lasix [Furosemide]     Dizzy nausea   Mushroom Extract Complex Nausea And Vomiting   Tramadol Other (See Comments) and Nausea And Vomiting   Buprenorphine Hcl Nausea And Vomiting   Codeine Nausea And Vomiting   Fish Oil Nausea And Vomiting   Morphine And Related Nausea And Vomiting    Level of Care/Admitting Diagnosis ED Disposition     ED Disposition  Admit   Condition  --   Comment  Hospital Area: [facility] [[id number]]  Level of Care: Telemetry Cardiac [[id number]]  May place patient in observation at [facility] or [place] if equivalent level of care is available:: Yes  Covid Evaluation: Asymptomatic - no recent exposure (last 10 days) testing not required  Diagnosis: Acute diverticulitis [id number]  Admitting Physician: Shela Leff [id number]  Attending Physician: Shela Leff [id number]          B Medical/Surgery History Past Medical History:  Diagnosis Date   Arthritis    Atrial fibrillation ([place])    Benign essential tremor    Breast cancer ([place])    Cataract    Bilateral 2018   Chronic renal insufficiency    Common migraine 0000000   Complication of anesthesia    Depression with anxiety     Drowsiness    Excessive daytime   Dyslipidemia    Fibromyalgia    History of partial seizures    Hypertension    Memory disturbance    Muscle tension headache    Obesity    Obstructive sleep apnea    PONV (postoperative nausea and vomiting)    S/P epidural steroid injection    Sacral contusion 07/2018   Seizures ([facility])    Partial   Sleep apnea    does not wear CPAP   Sprain of neck 06/26/2013   Tremors of nervous system    Vitamin B12 deficiency    Past Surgical History:  Procedure Laterality Date   ABDOMINAL HYSTERECTOMY     ablation procedure     Cardiac, for Afib   BASAL CELL CARCINOMA EXCISION     BASAL CELL CARCINOMA EXCISION     BRAIN MENINGIOMA EXCISION Right    BREAST RECONSTRUCTION Bilateral    breast reconstructive     CATARACT EXTRACTION, BILATERAL     CHOLECYSTECTOMY     DILATION AND CURETTAGE OF UTERUS     dilution     ESOPHAGEAL MANOMETRY N/A 12/14/2016   Procedure: ESOPHAGEAL MANOMETRY (EM);  Surgeon: Mauri Pole, MD;  Location: [facility];  Service: Endoscopy;  Laterality: N/A;   gallbladder resection     LUMBAR SPINE SURGERY N/A    lumbosacral  mastectomies Bilateral    NOSE SURGERY     Basal cell carcinoma resection from the nose   TUBAL LIGATION N/A      A IV Location/Drains/Wounds Patient Lines/Drains/Airways Status     Active Line/Drains/Airways     Name Placement date Placement time Site Days   Peripheral IV 01/09/23 18 G Left Antecubital 01/09/23  0028  Antecubital  less than 1            Intake/Output Last 24 hours  Intake/Output Summary (Last 24 hours) at 01/09/2023 0603 Last data filed at 01/09/2023 0603 Gross per 24 hour  Intake 286.79 ml  Output --  Net 286.79 ml    Labs/Imaging Results for orders placed or performed during the hospital encounter of 01/09/23 (from the past 48 hour(s))  CBC with Differential     Status: Abnormal   Collection Time: 01/09/23 12:40 AM  Result Value Ref Range   WBC 8.6 4.0 -  10.5 K/uL   RBC 4.59 3.87 - 5.11 MIL/uL   Hemoglobin 14.5 12.0 - 15.0 g/dL   HCT 43.4 36.0 - 46.0 %   MCV 94.6 80.0 - 100.0 fL   MCH 31.6 26.0 - 34.0 pg   MCHC 33.4 30.0 - 36.0 g/dL   RDW 13.9 11.5 - 15.5 %   Platelets 112 (L) 150 - 400 K/uL   nRBC 0.0 0.0 - 0.2 %   Neutrophils Relative % 90 %   Neutro Abs 7.6 1.7 - 7.7 K/uL   Lymphocytes Relative 8 %   Lymphs Abs 0.7 0.7 - 4.0 K/uL   Monocytes Relative 2 %   Monocytes Absolute 0.2 0.1 - 1.0 K/uL   Eosinophils Relative 0 %   Eosinophils Absolute 0.0 0.0 - 0.5 K/uL   Basophils Relative 0 %   Basophils Absolute 0.0 0.0 - 0.1 K/uL   Immature Granulocytes 0 %   Abs Immature Granulocytes 0.03 0.00 - 0.07 K/uL    Comment: Performed at [facility], [address], [place], [place] [address]  Comprehensive metabolic panel     Status: Abnormal   Collection Time: 01/09/23 12:40 AM  Result Value Ref Range   Sodium 136 135 - 145 mmol/L   Potassium 3.8 3.5 - 5.1 mmol/L   Chloride 97 (L) 98 - 111 mmol/L   CO2 25 22 - 32 mmol/L   Glucose, Bld 153 (H) 70 - 99 mg/dL    Comment: Glucose reference range applies only to samples taken after fasting for at least 8 hours.   BUN 11 8 - 23 mg/dL   Creatinine, Ser 0.78 0.44 - 1.00 mg/dL   Calcium 9.4 8.9 - 10.3 mg/dL   Total Protein 7.5 6.5 - 8.1 g/dL   Albumin 3.9 3.5 - 5.0 g/dL   AST 26 15 - 41 U/L   ALT 17 0 - 44 U/L   Alkaline Phosphatase 56 38 - 126 U/L   Total Bilirubin 1.2 0.3 - 1.2 mg/dL   GFR, Estimated >60 >60 mL/min    Comment: (NOTE) Calculated using the CKD-EPI Creatinine Equation (2021)    Anion gap 14 5 - 15    Comment: Performed at [facility] [address] [place], [place] [address]  Troponin I (High Sensitivity)     Status: Abnormal   Collection Time: 01/09/23 12:40 AM  Result Value Ref Range   Troponin I (High Sensitivity) 18 (H) <18 ng/L    Comment: (NOTE) Elevated high sensitivity troponin I (hsTnI) values and significant  changes across serial  measurements may suggest ACS but many other  chronic and acute conditions are known to elevate hsTnI results.  Refer to the "Links" section for chest pain algorithms and additional  guidance. Performed at [facility], [place] [address] [place], [place] [address]   Troponin I (High Sensitivity)     Status: Abnormal   Collection Time: 01/09/23  2:50 AM  Result Value Ref Range   Troponin I (High Sensitivity) 28 (H) <18 ng/L    Comment: (NOTE) Elevated high sensitivity troponin I (hsTnI) values and significant  changes across serial measurements may suggest ACS but many other  chronic and acute conditions are known to elevate hsTnI results.  Refer to the "Links" section for chest pain algorithms and additional  guidance. Performed at [facility], [place] [address], [place], [place] [address]   Urinalysis, Routine w reflex microscopic -Urine, Catheterized     Status: Abnormal   Collection Time: 01/09/23  3:58 AM  Result Value Ref Range   Color, Urine YELLOW YELLOW   APPearance CLEAR CLEAR   Specific Gravity, Urine 1.018 1.005 - 1.030   pH 7.0 5.0 - 8.0   Glucose, UA NEGATIVE NEGATIVE mg/dL   Hgb urine dipstick NEGATIVE NEGATIVE   Bilirubin Urine NEGATIVE NEGATIVE   Ketones, ur NEGATIVE NEGATIVE mg/dL   Protein, ur 100 (A) NEGATIVE mg/dL   Nitrite NEGATIVE NEGATIVE   Leukocytes,Ua NEGATIVE NEGATIVE   RBC / HPF 0-5 0 - 5 RBC/hpf   WBC, UA 0-5 0 - 5 WBC/hpf   Bacteria, UA NONE SEEN NONE SEEN   Squamous Epithelial / HPF 0-5 0 - 5 /HPF   Mucus PRESENT     Comment: Performed at [facility], [place] [address], [place], [place] [address]  Protime-INR     Status: Abnormal   Collection Time: 01/09/23  4:20 AM  Result Value Ref Range   Prothrombin Time 19.0 (H) 11.4 - 15.2 seconds   INR 1.6 (H) 0.8 - 1.2    Comment: (NOTE) INR goal varies based on device and disease states. Performed at [facility], [place] [address], [place], [place] [address]    CT Renal Stone  Study  Result Date: 01/09/2023 CLINICAL DATA:  Nausea and vomiting with abdominal pain, initial encounter EXAM: CT ABDOMEN AND PELVIS WITHOUT CONTRAST TECHNIQUE: Multidetector CT imaging of the abdomen and pelvis was performed following the standard protocol without IV contrast. RADIATION DOSE REDUCTION: This exam was performed according to the departmental dose-optimization program which includes automated exposure control, adjustment of the mA and/or kV according to patient size and/or use of iterative reconstruction technique. COMPARISON:  12/23/2015 FINDINGS: Lower chest: Scarring is noted in the bases bilaterally. No focal infiltrate is seen. Bilateral breast implants are noted. Small subcutaneous nodule is noted along the posterior right chest wall likely related to a sebaceous cyst. Hepatobiliary: No focal liver abnormality is seen. Status post cholecystectomy. No biliary dilatation. Pancreas: Unremarkable. No pancreatic ductal dilatation or surrounding inflammatory changes. Spleen: Normal in size without focal abnormality. Adrenals/Urinary Tract: Adrenal glands are within normal limits. Kidneys show a normal enhancement pattern. 1 cm hypodensity is noted in the left kidney consistent with small cyst. No further follow-up is recommended. No obstructive changes are seen. The bladder is decompressed. Stomach/Bowel: Scattered diverticular changes noted with very minimal pericolonic inflammatory change which may represent some early diverticulitis. The appendix is not well visualized and likely surgically removed. No small bowel or gastric abnormality is  noted. Vascular/Lymphatic: Aortic atherosclerosis. No enlarged abdominal or pelvic lymph nodes. Reproductive: Status post hysterectomy. No adnexal masses. Other: No abdominal wall hernia or abnormality. No abdominopelvic ascites. Musculoskeletal: No acute or significant osseous findings. IMPRESSION: Diverticulosis with findings of very mild sigmoid  diverticulitis. No perforation or abscess is identified. No renal calculi or obstructive changes are noted. Scarring in the lung bases bilaterally. Electronically Signed   By: Inez Catalina M.D.   On: 01/09/2023 01:18   CT Head Wo Contrast  Result Date: 01/09/2023 CLINICAL DATA:  Headaches and nausea and vomiting, initial encounter EXAM: CT HEAD WITHOUT CONTRAST TECHNIQUE: Contiguous axial images were obtained from the base of the skull through the vertex without intravenous contrast. RADIATION DOSE REDUCTION: This exam was performed according to the departmental dose-optimization program which includes automated exposure control, adjustment of the mA and/or kV according to patient size and/or use of iterative reconstruction technique. COMPARISON:  07/11/2018 FINDINGS: Brain: No evidence of acute infarction, hemorrhage, hydrocephalus, extra-axial collection or mass lesion/mass effect. Vascular: No hyperdense vessel or unexpected calcification. Skull: Postsurgical changes are noted in the posterior right parietal region, stable from the prior exam. Mild hyperostosis is noted. Sinuses/Orbits: No acute finding. Other: None. IMPRESSION: No acute intracranial abnormality noted. Electronically Signed   By: Inez Catalina M.D.   On: 01/09/2023 01:13   DG Chest Portable 1 View  Result Date: 01/09/2023 CLINICAL DATA:  Shortness of breath EXAM: PORTABLE CHEST 1 VIEW COMPARISON:  03/04/2019 FINDINGS: Cardiomegaly, vascular congestion. Bibasilar airspace opacities. Possible small left effusion. No acute bony abnormality. IMPRESSION: Cardiomegaly with vascular congestion and bibasilar opacities which could reflect atelectasis or infiltrates. Possible small left effusion. Electronically Signed   By: Rolm Baptise M.D.   On: 01/09/2023 00:46    Pending Labs Unresulted Labs (From admission, onward)    None       Vitals/Pain Today's Vitals   01/09/23 0411 01/09/23 0415 01/09/23 0500 01/09/23 0526  BP:  (!) 157/90 (!)  143/61   Pulse:  (!) 59 66   Resp:  20 18   Temp: 97.7 F (36.5 C)     TempSrc: Oral     SpO2:  96% 98%   Weight:      Height:      PainSc:    4     Isolation Precautions No active isolations  Medications Medications  cefTRIAXone (ROCEPHIN) 1 g in sodium chloride 0.9 % 100 mL IVPB (0 g Intravenous Stopped 01/09/23 0439)  azithromycin (ZITHROMAX) 500 mg in sodium chloride 0.9 % 250 mL IVPB (0 mg Intravenous Stopped 01/09/23 0602)  magnesium sulfate IVPB 2 g 50 mL (0 g Intravenous Stopped 01/09/23 0603)  acetaminophen (TYLENOL) tablet 1,000 mg (1,000 mg Oral Given 01/09/23 0406)  ondansetron (ZOFRAN) injection 4 mg (4 mg Intravenous Given 01/09/23 0526)    Mobility walks with device        R Recommendations: See Admitting Provider Note  Report given to:   Additional Notes: A&Ox4, CXR- bilat PNA, CT-diverticulosis, given abx, has headache 4/10, family at bedside.

## 2023-01-09 NOTE — Progress Notes (Signed)
PROGRESS NOTE    KATISHA DUDDY  [id number] DOB: 26-Oct-1946 DOA: 01/09/2023 PCP: System, Provider Not In   Brief Narrative: Debbie [name] is a 77 y.o. female with a history of paroxysmal atrial fibrillation, PE, hypertension, hyperlipidemia, obesity, sleep apnea, fibromyalgia, chronic migraine headaches, primary hypertension, brain tumor, seizure disorder, breast cancer, depression, anxiety. Patient presented secondary to nausea, vomiting, diarrhea, abdominal pain, headaches, cough and dyspnea. Initial workup concerning for diverticulitis with questionable presence of pneumonia. Empiric antibiotics started. Workup negative for C. Difficile and other GI pathogens.   Assessment and Plan:  Acute diverticulitis Mild disease noted on CT abdomen/pelvis. Associated abdominal pain. No GI bleed noted. Patient started empirically on Zosyn IV.   Abdominal pain Nausea/vomiting/diarrhea Presumed secondary to acute infection. C. Difficile testing negative. Gi pathogen panel pending. -Continue analgesics -Continue Zofran; add Phenergan for breakthrough -Start NS IV fluids  Abnormal chest imaging Concern for possible pneumonia on admission. Patient with non-productive cough. No other symptoms. Patient without hypoxia. -Incentive spirometer  Chest pain Atypical. Mild troponin rise, non-exertional. EKG significant for no ischemic changes. No current symptoms. Troponin elevation not consistent with ACS; likely demand ischemia.  Chronic migraine headaches Noted. Patient with current headache -Resume home Norco PRN  Paroxysmal atrial fibrillation Currently in sinus rhythm with some mild bradycardia intermittently. On Coumadin as an outpatient. On amiodarone for rhythm control -Continue amiodarone -Continue Coumadin  Primary hypertension Uncontrolled during admission. Patient started on hydralazine PRN.   Thrombocytopenia Mild. Platelets of 112,000 on admission. Likely secondary to  acute infection. Improved to 132,000 today  Seizure disorder -Continue lamotrigine  DVT prophylaxis: Coumadin Code Status:   Code Status: DNR Family Communication: None at bedside Disposition Plan: Discharge home likely in 2-3 days pending improvement of symptoms, ability to tolerate oral intake, transition to oral antibiotics   Consultants:  None  Procedures:  None  Antimicrobials: Zosyn IV Azithromycin    Subjective: Patient reports continued abdominal pain. She also reports a headache and some nose pain, possibly related to oxygen use. Nausea has been a problem and is mildly improved with Zofran. Bowel movement documented this morning.  Objective: BP (!) 141/127 (BP Location: Right Arm)   Pulse (!) 59   Temp 97.9 F (36.6 C) (Oral)   Resp 20   Ht '5\' 8"'$  (1.727 m)   Wt 102.8 kg   SpO2 93%   BMI 34.46 kg/m   Examination:  General exam: Appears calm and comfortable Respiratory system: Clear to auscultation. Respiratory effort normal. Cardiovascular system: S1 & S2 heard, RRR. 1/6 systolic murmur Gastrointestinal system: Abdomen is nondistended, soft and nontender.  Normal bowel sounds heard. Central nervous system: Alert and oriented. No focal neurological deficits. Musculoskeletal: No edema. No calf tenderness Skin: No cyanosis. No rashes Psychiatry: Judgement and insight appear normal. Mood & affect appropriate.    Data Reviewed: I have personally reviewed following labs and imaging studies  CBC Lab Results  Component Value Date   WBC 10.8 (H) 01/09/2023   RBC 4.46 01/09/2023   HGB 14.1 01/09/2023   HCT 41.7 01/09/2023   MCV 93.5 01/09/2023   MCH 31.6 01/09/2023   PLT 132 (L) 01/09/2023   MCHC 33.8 01/09/2023   RDW 13.8 01/09/2023   LYMPHSABS 0.7 01/09/2023   MONOABS 0.2 01/09/2023   EOSABS 0.0 01/09/2023   BASOSABS 0.0 [id number]     Last metabolic panel Lab Results  Component Value Date   NA 136 01/09/2023   K 3.6 01/09/2023   CL 99  01/09/2023   CO2 22 01/09/2023   BUN 9 01/09/2023   CREATININE 0.85 01/09/2023   GLUCOSE 131 (H) 01/09/2023   GFRNONAA >60 01/09/2023   GFRAA 82 07/29/2020   CALCIUM 8.9 01/09/2023   PROT 7.5 01/09/2023   ALBUMIN 3.9 01/09/2023   LABGLOB 2.6 04/19/2018   AGRATIO 1.6 04/19/2018   BILITOT 1.2 01/09/2023   ALKPHOS 56 01/09/2023   AST 26 01/09/2023   ALT 17 01/09/2023   ANIONGAP 15 01/09/2023    GFR: Estimated Creatinine Clearance: 70.7 mL/min (by C-G formula based on SCr of 0.85 mg/dL).  Recent Results (from the past 240 hour(s))  Resp panel by RT-PCR (RSV, Flu A&B, Covid) Anterior Nasal Swab     Status: None   Collection Time: 01/09/23  6:43 AM   Specimen: Anterior Nasal Swab  Result Value Ref Range Status   SARS Coronavirus 2 by RT PCR NEGATIVE NEGATIVE Final   Influenza A by PCR NEGATIVE NEGATIVE Final   Influenza B by PCR NEGATIVE NEGATIVE Final    Comment: (NOTE) The Xpert Xpress SARS-CoV-2/FLU/RSV plus assay is intended as an aid in the diagnosis of influenza from Nasopharyngeal swab specimens and should not be used as a sole basis for treatment. Nasal washings and aspirates are unacceptable for Xpert Xpress SARS-CoV-2/FLU/RSV testing.  Fact Sheet for Patients: EntrepreneurPulse.com.au  Fact Sheet for Healthcare Providers: IncredibleEmployment.be  This test is not yet approved or cleared by the [place] FDA and has been authorized for detection and/or diagnosis of SARS-CoV-2 by FDA under an Emergency Use Authorization (EUA). This EUA will remain in effect (meaning this test can be used) for the duration of the COVID-19 declaration under Section 564(b)(1) of the Act, 21 U.S.C. section 360bbb-3(b)(1), unless the authorization is terminated or revoked.     Resp Syncytial Virus by PCR NEGATIVE NEGATIVE Final    Comment: (NOTE) Fact Sheet for Patients: EntrepreneurPulse.com.au  Fact Sheet for Healthcare  Providers: IncredibleEmployment.be  This test is not yet approved or cleared by the [place] FDA and has been authorized for detection and/or diagnosis of SARS-CoV-2 by FDA under an Emergency Use Authorization (EUA). This EUA will remain in effect (meaning this test can be used) for the duration of the COVID-19 declaration under Section 564(b)(1) of the Act, 21 U.S.C. section 360bbb-3(b)(1), unless the authorization is terminated or revoked.  Performed at [facility], [place] [address], [place], [place] [address]   C Difficile Quick Screen w PCR reflex     Status: None   Collection Time: 01/09/23  6:43 AM   Specimen: Stool  Result Value Ref Range Status   C Diff antigen NEGATIVE NEGATIVE Final   C Diff toxin NEGATIVE NEGATIVE Final   C Diff interpretation No C. difficile detected.  Final    Comment: Performed at [facility], [place] [address], [place], [place] [address]      Radiology Studies: CT Renal Stone Study  Result Date: 01/09/2023 CLINICAL DATA:  Nausea and vomiting with abdominal pain, initial encounter EXAM: CT ABDOMEN AND PELVIS WITHOUT CONTRAST TECHNIQUE: Multidetector CT imaging of the abdomen and pelvis was performed following the standard protocol without IV contrast. RADIATION DOSE REDUCTION: This exam was performed according to the departmental dose-optimization program which includes automated exposure control, adjustment of the mA and/or kV according to patient size and/or use of iterative reconstruction technique. COMPARISON:  12/23/2015 FINDINGS: Lower chest: Scarring is noted in the bases bilaterally. No focal infiltrate is seen. Bilateral breast implants are noted.  Small subcutaneous nodule is noted along the posterior right chest wall likely related to a sebaceous cyst. Hepatobiliary: No focal liver abnormality is seen. Status post cholecystectomy. No biliary dilatation. Pancreas: Unremarkable. No pancreatic ductal dilatation or  surrounding inflammatory changes. Spleen: Normal in size without focal abnormality. Adrenals/Urinary Tract: Adrenal glands are within normal limits. Kidneys show a normal enhancement pattern. 1 cm hypodensity is noted in the left kidney consistent with small cyst. No further follow-up is recommended. No obstructive changes are seen. The bladder is decompressed. Stomach/Bowel: Scattered diverticular changes noted with very minimal pericolonic inflammatory change which may represent some early diverticulitis. The appendix is not well visualized and likely surgically removed. No small bowel or gastric abnormality is noted. Vascular/Lymphatic: Aortic atherosclerosis. No enlarged abdominal or pelvic lymph nodes. Reproductive: Status post hysterectomy. No adnexal masses. Other: No abdominal wall hernia or abnormality. No abdominopelvic ascites. Musculoskeletal: No acute or significant osseous findings. IMPRESSION: Diverticulosis with findings of very mild sigmoid diverticulitis. No perforation or abscess is identified. No renal calculi or obstructive changes are noted. Scarring in the lung bases bilaterally. Electronically Signed   By: Inez Catalina M.D.   On: 01/09/2023 01:18   CT Head Wo Contrast  Result Date: 01/09/2023 CLINICAL DATA:  Headaches and nausea and vomiting, initial encounter EXAM: CT HEAD WITHOUT CONTRAST TECHNIQUE: Contiguous axial images were obtained from the base of the skull through the vertex without intravenous contrast. RADIATION DOSE REDUCTION: This exam was performed according to the departmental dose-optimization program which includes automated exposure control, adjustment of the mA and/or kV according to patient size and/or use of iterative reconstruction technique. COMPARISON:  07/11/2018 FINDINGS: Brain: No evidence of acute infarction, hemorrhage, hydrocephalus, extra-axial collection or mass lesion/mass effect. Vascular: No hyperdense vessel or unexpected calcification. Skull:  Postsurgical changes are noted in the posterior right parietal region, stable from the prior exam. Mild hyperostosis is noted. Sinuses/Orbits: No acute finding. Other: None. IMPRESSION: No acute intracranial abnormality noted. Electronically Signed   By: Inez Catalina M.D.   On: 01/09/2023 01:13   DG Chest Portable 1 View  Result Date: 01/09/2023 CLINICAL DATA:  Shortness of breath EXAM: PORTABLE CHEST 1 VIEW COMPARISON:  03/04/2019 FINDINGS: Cardiomegaly, vascular congestion. Bibasilar airspace opacities. Possible small left effusion. No acute bony abnormality. IMPRESSION: Cardiomegaly with vascular congestion and bibasilar opacities which could reflect atelectasis or infiltrates. Possible small left effusion. Electronically Signed   By: Rolm Baptise M.D.   On: 01/09/2023 00:46      LOS: 0 days    Cordelia Poche, MD [facility] 01/09/2023, 10:55 AM   If 7PM-7AM, please contact night-coverage [email or web address]

## 2023-01-09 NOTE — Evaluation (Signed)
Occupational Therapy Evaluation Patient Details Name: Debbie [name] MRN: [id number] DOB: 08/07/1946 Today's Date: 01/09/2023   History of Present Illness Debbie [name] is a 77 yo female who presented with nausea, vomiting, diarrhea, abdominal pain, headaches, cough, and shortness of breath. Chest x-ray showing cardiomegaly with vascular congestion and bibasilar opacities which could reflect atelectasis or infiltrates.  Possible small left effusion.  CT head negative for acute finding.  CT renal stone study showing diverticulosis with findings of very mild sigmoid diverticulitis. PMHx: persistent atrial fibrillation status post ablation now paroxysmal on warfarin, PE in 2017, hypertension, hyperlipidemia, obesity (BMI 34.36), sleep apnea not on CPAP, fibromyalgia, chronic migraine headaches, essential tremor, history of brain tumor in 2005, seizure disorder, history of breast cancer, depression and anxiety   Clinical Impression   Debbie [name] was evaluated s/p the above admission list. She is generally mod I at baseline with use of AD and intermittent assist from her daughter who lives in the home. Upon evaluation she was limited by general malaise, weakness, decreased activity tolerance and unsteady balance. Overall she required up to min A for transfers and short mobility. Due to the deficits listed below, she also requires up to min A for ADLs. Pt will benefit from continued acute OT services. Recommend d/c to home with [facility] for continued rehab.       Recommendations for follow up therapy are one component of a multi-disciplinary discharge planning process, led by the attending physician.  Recommendations may be updated based on patient status, additional functional criteria and insurance authorization.   Follow Up Recommendations  Home health OT     Assistance Recommended at Discharge Frequent or constant Supervision/Assistance  Patient can return home with the following A little help with  walking and/or transfers;A little help with bathing/dressing/bathroom;Assistance with cooking/housework;Assist for transportation;Help with stairs or ramp for entrance    Functional Status Assessment  Patient has had a recent decline in their functional status and demonstrates the ability to make significant improvements in function in a reasonable and predictable amount of time.  Equipment Recommendations  None recommended by OT    Recommendations for Other Services       Precautions / Restrictions Precautions Precautions: Fall Restrictions Weight Bearing Restrictions: No      Mobility Bed Mobility Overal bed mobility: Needs Assistance Bed Mobility: Supine to Sit     Supine to sit: Min assist          Transfers Overall transfer level: Needs assistance Equipment used: None Transfers: Sit to/from Stand, Bed to chair/wheelchair/BSC Sit to Stand: Min assist     Step pivot transfers: Min guard            Balance Overall balance assessment: Needs assistance Sitting-balance support: Feet supported Sitting balance-Leahy Scale: Good     Standing balance support: No upper extremity supported, During functional activity Standing balance-Leahy Scale: Fair                             ADL either performed or assessed with clinical judgement   ADL Overall ADL's : Needs assistance/impaired Eating/Feeding: Independent;Sitting   Grooming: Set up;Sitting   Upper Body Bathing: Set up;Sitting   Lower Body Bathing: Minimal assistance;Sit to/from stand   Upper Body Dressing : Set up;Sitting   Lower Body Dressing: Minimal assistance;Sit to/from stand   Toilet Transfer: Minimal assistance;Stand-pivot;BSC/3in1 Armed forces technical officer Details (indicate cue type and reason): limited by urinary urgency Toileting- Clothing Manipulation and Hygiene:  Min guard;Sitting/lateral lean       Functional mobility during ADLs: Minimal assistance General ADL Comments: poor  activity tolerance, weakness, urinary urgency     Vision Baseline Vision/History: 1 Wears glasses Vision Assessment?: No apparent visual deficits     Perception Perception Perception Tested?: No   Praxis Praxis Praxis tested?: Not tested    Pertinent Vitals/Pain Pain Assessment Pain Assessment: No/denies pain     Hand Dominance Right   Extremity/Trunk Assessment Upper Extremity Assessment Upper Extremity Assessment: Generalized weakness   Lower Extremity Assessment Lower Extremity Assessment: Defer to PT evaluation   Cervical / Trunk Assessment Cervical / Trunk Assessment: Kyphotic   Communication Communication Communication: No difficulties   Cognition Arousal/Alertness: Awake/alert Behavior During Therapy: WFL for tasks assessed/performed Overall Cognitive Status: Within Functional Limits for tasks assessed                                       General Comments  Pt was on 2L [place] upon arrival, removed for session with sats >90% the entire session    Exercises     Shoulder Instructions      Home Living Family/patient expects to be discharged to:: Private residence Living Arrangements: Spouse/significant other Available Help at Discharge: Family;Available 24 hours/day Type of Home: House Home Access: Stairs to enter [employer] of Steps: 4 Entrance Stairs-Rails: Right;Left Home Layout: One level     Bathroom Shower/Tub: Teacher, early years/pre: Standard     Home Equipment: Conservation officer, nature (2 wheels);Cane - single point;BSC/3in1;Tub bench;Grab bars - toilet;Grab bars - tub/shower   Additional Comments: daughter and son in law live in basement and provide 24/7 assist      Prior Functioning/Environment Prior Level of Function : Independent/Modified Independent;Needs assist             Mobility Comments: RW vs SPC ADLs Comments: generally mod I, daughter assists when needed        OT Problem List: Decreased  strength;Decreased range of motion;Decreased activity tolerance;Impaired balance (sitting and/or standing);Decreased safety awareness;Decreased knowledge of precautions      OT Treatment/Interventions: Self-care/ADL training;Therapeutic exercise;DME and/or AE instruction;Therapeutic activities;Patient/family education;Balance training    OT Goals(Current goals can be found in the care plan section) Acute Rehab OT Goals Patient Stated Goal: to feel better OT Goal Formulation: With patient Time For Goal Achievement: 01/23/23 Potential to Achieve Goals: Good ADL Goals Pt Will Perform Grooming: with supervision;standing Pt Will Perform Lower Body Dressing: with supervision;sit to/from stand Pt Will Transfer to Toilet: with supervision;ambulating Pt Will Perform Toileting - Clothing Manipulation and hygiene: with modified independence Additional ADL Goal #1: Pt will indep recall at least 3 energy conservation strategies to apply at discharge  OT Frequency: Min 2X/week       AM-PAC OT "6 Clicks" Daily Activity     Outcome Measure Help from another person eating meals?: None Help from another person taking care of personal grooming?: A Little Help from another person toileting, which includes using toliet, bedpan, or urinal?: A Little Help from another person bathing (including washing, rinsing, drying)?: A Little Help from another person to put on and taking off regular upper body clothing?: A Little Help from another person to put on and taking off regular lower body clothing?: A Little 6 Click Score: 19   End of Session Equipment Utilized During Treatment: Rolling walker (2 wheels) Nurse Communication: Mobility status  Activity Tolerance: Patient tolerated treatment well Patient left: in chair;with call bell/phone within reach  OT Visit Diagnosis: Unsteadiness on feet (R26.81);Other abnormalities of gait and mobility (R26.89);Muscle weakness (generalized) (M62.81)                 Time: [id number] OT Time Calculation (min): 23 min Charges:  OT General Charges $OT Visit: 1 Visit OT Evaluation $OT Eval Moderate Complexity: 1 Mod OT Treatments $Self Care/Home Management : 8-22 mins  Shade Flood, OTR/L Acute Rehabilitation Services Office [phone] Secure Chat Communication Preferred   Elliot Cousin 01/09/2023, 1:26 PM

## 2023-01-09 NOTE — Hospital Course (Signed)
Debbie [name] is a 77 y.o. female with a history of paroxysmal atrial fibrillation, PE, hypertension, hyperlipidemia, obesity, sleep apnea, fibromyalgia, chronic migraine headaches, primary hypertension, brain tumor, seizure disorder, breast cancer, depression, anxiety. Patient presented secondary to nausea, vomiting, diarrhea, abdominal pain, headaches, cough and dyspnea. Initial workup concerning for diverticulitis with questionable presence of pneumonia. Empiric antibiotics started. Workup negative for C. Difficile and other GI pathogens.

## 2023-01-09 NOTE — ED Provider Notes (Signed)
[facility] Provider Note   CSN: [id number] Arrival date & time: 01/09/23  0011     History  Chief Complaint  Patient presents with   Headache    Debbie [name] is a 77 y.o. female.  The history is provided by the patient.  Illness Location:  Body at home Quality:  Abdominal pain, SOB and cough and also a headache and 8-10 episodes of vomiting and diarrhea Severity:  Moderate Onset quality:  Gradual Duration: days 2. Timing:  Constant Progression:  Worsening Chronicity:  New Context:  Complex PMH Relieved by:  Nothing Worsened by:  Nothing Ineffective treatments:  Home antiemetics Associated symptoms: abdominal pain, cough, diarrhea, nausea, shortness of breath and vomiting   Associated symptoms: no fever   Risk factors:  Elderly with multiple medical problems     Past Medical History:  Diagnosis Date   Arthritis    Atrial fibrillation (HCC)    Benign essential tremor    Breast cancer (HCC)    Cataract    Bilateral 2018   Chronic renal insufficiency    Common migraine 0000000   Complication of anesthesia    Depression with anxiety    Drowsiness    Excessive daytime   Dyslipidemia    Fibromyalgia    History of partial seizures    Hypertension    Memory disturbance    Muscle tension headache    Obesity    Obstructive sleep apnea    PONV (postoperative nausea and vomiting)    S/P epidural steroid injection    Sacral contusion 07/2018   Seizures (HCC)    Partial   Sleep apnea    does not wear CPAP   Sprain of neck 06/26/2013   Tremors of nervous system    Vitamin B12 deficiency      Home Medications Prior to Admission medications   Medication Sig Start Date End Date Taking? Authorizing Provider  acetaminophen (TYLENOL) 500 MG tablet Take 500 mg by mouth in the morning, at noon, and at bedtime.   Yes [provider]  amiodarone (PACERONE) 100 MG tablet Take 1 tablet (100 mg total) by mouth  daily. 03/26/21  Yes Buford Dresser, MD  Ascorbic Acid (VITAMIN C PO) Take 282 mg by mouth daily.    Yes [provider]  calcium carbonate (OS-CAL) 600 MG TABS tablet Take 600 mg by mouth daily with breakfast. With vitamin D 800 units   Yes [provider]  gabapentin (NEURONTIN) 600 MG tablet Take 1,500 mg by mouth 3 (three) times daily.   Yes [provider]  HYDROcodone-acetaminophen (NORCO) 7.5-325 MG tablet Take 1 tablet by mouth 3 (three) times daily as needed for moderate pain. 03/07/17  Yes [provider]  lactulose (CHRONULAC) 10 GM/15ML solution Take 41m by mouth daily as needed for constipation   Yes [provider]  lamoTRIgine (LAMICTAL) 25 MG tablet Take 1 tablet (25 mg total) by mouth 2 (two) times daily. 07/06/20  Yes SSuzzanne Cloud NP  LEVALBUTEROL HCL IN Inhale into the lungs daily as needed (for shortness of breath).   Yes [provider]  ondansetron (ZOFRAN) 4 MG tablet Take 4 mg by mouth every 8 (eight) hours as needed for nausea or vomiting. 01/08/23  Yes [provider]  polyethylene glycol (MIRALAX / GLYCOLAX) packet Take 4.25 g by mouth daily. Mix 1/4 capful with beverage of choice and drink daily   Yes [provider]  promethazine (PHENERGAN)  25 MG tablet Take 6.25 mg by mouth every 6 (six) hours as needed for nausea or vomiting.   Yes [provider]  vitamin B-12 (CYANOCOBALAMIN) 1000 MCG tablet Take 1,000 mcg by mouth daily.   Yes [provider]  Vitamin D, Ergocalciferol, (DRISDOL) 1.25 MG (50000 UNIT) CAPS capsule TAKE 1 CAPSULE BY MOUTH EVERY 10 DAYS Patient taking differently: Take 50,000 Units by mouth See admin instructions. TAKE 1 CAPSULE BY MOUTH EVERY 10 DAYS 09/28/20  Yes [name], [name], MD  warfarin (COUMADIN) 5 MG tablet TAKE 1/2 to 1 TABLET BY MOUTH DAILY AS DIRECTED BY ANTICOAGULATION CLINIC Patient taking differently: Take 2.5-5 mg by mouth See admin  instructions. Take 1 tablet by mouth once daily every Sunday, Tuesday, Thursday and one half tablet (2.'5mg'$ ) all other days or as [facility] 07/28/22  Yes Buford Dresser, MD  amiodarone (PACERONE) 200 MG tablet TAKE ONE TABLET BY MOUTH DAILY (TO START AFTER THE 28 DAY DOSE) Patient not taking: Reported on 01/09/2023 12/26/22   Skeet Latch, MD      Allergies    Diclofenac, Atorvastatin, Tape, Albuterol, Lasix [furosemide], Mushroom extract complex, Tramadol, Buprenorphine hcl, Codeine, Fish oil, and Morphine and related    Review of Systems   Review of Systems  Constitutional:  Negative for fever.  Respiratory:  Positive for cough and shortness of breath.   Gastrointestinal:  Positive for abdominal pain, diarrhea, nausea and vomiting.  All other systems reviewed and are negative.   Physical Exam Updated Vital Signs BP (!) 157/90   Pulse (!) 59   Temp 97.7 F (36.5 C) (Oral)   Resp 20   Ht '5\' 8"'$  (1.727 m)   Wt 102.5 kg   SpO2 96%   BMI 34.36 kg/m  Physical Exam Vitals and nursing note reviewed. Exam conducted with a chaperone present.  Constitutional:      General: She is not in acute distress.    Appearance: Normal appearance. She is well-developed.  HENT:     Head: Normocephalic and atraumatic.     Nose: Nose normal.  Eyes:     Pupils: Pupils are equal, round, and reactive to light.  Cardiovascular:     Rate and Rhythm: Normal rate and regular rhythm.     Pulses: Normal pulses.     Heart sounds: Normal heart sounds.  Pulmonary:     Effort: Pulmonary effort is normal. No respiratory distress.     Breath sounds: Rales present.  Abdominal:     General: Bowel sounds are normal. There is no distension.     Palpations: Abdomen is soft.     Tenderness: There is abdominal tenderness. There is no guarding or rebound.  Genitourinary:    Vagina: No vaginal discharge.  Musculoskeletal:        General: Normal range of motion.     Cervical back:  Neck supple.  Skin:    General: Skin is warm and dry.     Capillary Refill: Capillary refill takes less than 2 seconds.     Findings: No erythema or rash.  Neurological:     General: No focal deficit present.     Mental Status: She is alert.     Deep Tendon Reflexes: Reflexes normal.  Psychiatric:        Mood and Affect: Mood normal.     ED Results / Procedures / Treatments   Labs (all labs ordered are listed, but only abnormal results are displayed) Results for orders placed or  performed during the hospital encounter of 01/09/23  CBC with Differential  Result Value Ref Range   WBC 8.6 4.0 - 10.5 K/uL   RBC 4.59 3.87 - 5.11 MIL/uL   Hemoglobin 14.5 12.0 - 15.0 g/dL   HCT 43.4 36.0 - 46.0 %   MCV 94.6 80.0 - 100.0 fL   MCH 31.6 26.0 - 34.0 pg   MCHC 33.4 30.0 - 36.0 g/dL   RDW 13.9 11.5 - 15.5 %   Platelets 112 (L) 150 - 400 K/uL   nRBC 0.0 0.0 - 0.2 %   Neutrophils Relative % 90 %   Neutro Abs 7.6 1.7 - 7.7 K/uL   Lymphocytes Relative 8 %   Lymphs Abs 0.7 0.7 - 4.0 K/uL   Monocytes Relative 2 %   Monocytes Absolute 0.2 0.1 - 1.0 K/uL   Eosinophils Relative 0 %   Eosinophils Absolute 0.0 0.0 - 0.5 K/uL   Basophils Relative 0 %   Basophils Absolute 0.0 0.0 - 0.1 K/uL   Immature Granulocytes 0 %   Abs Immature Granulocytes 0.03 0.00 - 0.07 K/uL  Comprehensive metabolic panel  Result Value Ref Range   Sodium 136 135 - 145 mmol/L   Potassium 3.8 3.5 - 5.1 mmol/L   Chloride 97 (L) 98 - 111 mmol/L   CO2 25 22 - 32 mmol/L   Glucose, Bld 153 (H) 70 - 99 mg/dL   BUN 11 8 - 23 mg/dL   Creatinine, Ser 0.78 0.44 - 1.00 mg/dL   Calcium 9.4 8.9 - 10.3 mg/dL   Total Protein 7.5 6.5 - 8.1 g/dL   Albumin 3.9 3.5 - 5.0 g/dL   AST 26 15 - 41 U/L   ALT 17 0 - 44 U/L   Alkaline Phosphatase 56 38 - 126 U/L   Total Bilirubin 1.2 0.3 - 1.2 mg/dL   GFR, Estimated >60 >60 mL/min   Anion gap 14 5 - 15  Urinalysis, Routine w reflex microscopic -Urine, Catheterized  Result Value Ref  Range   Color, Urine YELLOW YELLOW   APPearance CLEAR CLEAR   Specific Gravity, Urine 1.018 1.005 - 1.030   pH 7.0 5.0 - 8.0   Glucose, UA NEGATIVE NEGATIVE mg/dL   Hgb urine dipstick NEGATIVE NEGATIVE   Bilirubin Urine NEGATIVE NEGATIVE   Ketones, ur NEGATIVE NEGATIVE mg/dL   Protein, ur 100 (A) NEGATIVE mg/dL   Nitrite NEGATIVE NEGATIVE   Leukocytes,Ua NEGATIVE NEGATIVE   RBC / HPF 0-5 0 - 5 RBC/hpf   WBC, UA 0-5 0 - 5 WBC/hpf   Bacteria, UA NONE SEEN NONE SEEN   Squamous Epithelial / HPF 0-5 0 - 5 /HPF   Mucus PRESENT   Protime-INR  Result Value Ref Range   Prothrombin Time 19.0 (H) 11.4 - 15.2 seconds   INR 1.6 (H) 0.8 - 1.2  Troponin I (High Sensitivity)  Result Value Ref Range   Troponin I (High Sensitivity) 18 (H) <18 ng/L  Troponin I (High Sensitivity)  Result Value Ref Range   Troponin I (High Sensitivity) 28 (H) <18 ng/L   CT Renal Stone Study  Result Date: 01/09/2023 CLINICAL DATA:  Nausea and vomiting with abdominal pain, initial encounter EXAM: CT ABDOMEN AND PELVIS WITHOUT CONTRAST TECHNIQUE: Multidetector CT imaging of the abdomen and pelvis was performed following the standard protocol without IV contrast. RADIATION DOSE REDUCTION: This exam was performed according to the departmental dose-optimization program which includes automated exposure control, adjustment of the mA and/or kV according to patient size  and/or use of iterative reconstruction technique. COMPARISON:  12/23/2015 FINDINGS: Lower chest: Scarring is noted in the bases bilaterally. No focal infiltrate is seen. Bilateral breast implants are noted. Small subcutaneous nodule is noted along the posterior right chest wall likely related to a sebaceous cyst. Hepatobiliary: No focal liver abnormality is seen. Status post cholecystectomy. No biliary dilatation. Pancreas: Unremarkable. No pancreatic ductal dilatation or surrounding inflammatory changes. Spleen: Normal in size without focal abnormality.  Adrenals/Urinary Tract: Adrenal glands are within normal limits. Kidneys show a normal enhancement pattern. 1 cm hypodensity is noted in the left kidney consistent with small cyst. No further follow-up is recommended. No obstructive changes are seen. The bladder is decompressed. Stomach/Bowel: Scattered diverticular changes noted with very minimal pericolonic inflammatory change which may represent some early diverticulitis. The appendix is not well visualized and likely surgically removed. No small bowel or gastric abnormality is noted. Vascular/Lymphatic: Aortic atherosclerosis. No enlarged abdominal or pelvic lymph nodes. Reproductive: Status post hysterectomy. No adnexal masses. Other: No abdominal wall hernia or abnormality. No abdominopelvic ascites. Musculoskeletal: No acute or significant osseous findings. IMPRESSION: Diverticulosis with findings of very mild sigmoid diverticulitis. No perforation or abscess is identified. No renal calculi or obstructive changes are noted. Scarring in the lung bases bilaterally. Electronically Signed   By: Inez Catalina M.D.   On: 01/09/2023 01:18   CT Head Wo Contrast  Result Date: 01/09/2023 CLINICAL DATA:  Headaches and nausea and vomiting, initial encounter EXAM: CT HEAD WITHOUT CONTRAST TECHNIQUE: Contiguous axial images were obtained from the base of the skull through the vertex without intravenous contrast. RADIATION DOSE REDUCTION: This exam was performed according to the departmental dose-optimization program which includes automated exposure control, adjustment of the mA and/or kV according to patient size and/or use of iterative reconstruction technique. COMPARISON:  07/11/2018 FINDINGS: Brain: No evidence of acute infarction, hemorrhage, hydrocephalus, extra-axial collection or mass lesion/mass effect. Vascular: No hyperdense vessel or unexpected calcification. Skull: Postsurgical changes are noted in the posterior right parietal region, stable from the prior  exam. Mild hyperostosis is noted. Sinuses/Orbits: No acute finding. Other: None. IMPRESSION: No acute intracranial abnormality noted. Electronically Signed   By: Inez Catalina M.D.   On: 01/09/2023 01:13   DG Chest Portable 1 View  Result Date: 01/09/2023 CLINICAL DATA:  Shortness of breath EXAM: PORTABLE CHEST 1 VIEW COMPARISON:  03/04/2019 FINDINGS: Cardiomegaly, vascular congestion. Bibasilar airspace opacities. Possible small left effusion. No acute bony abnormality. IMPRESSION: Cardiomegaly with vascular congestion and bibasilar opacities which could reflect atelectasis or infiltrates. Possible small left effusion. Electronically Signed   By: Rolm Baptise M.D.   On: 01/09/2023 00:46     EKG EKG Interpretation  Date/Time:  Monday January 09 2023 00:16:28 EST Ventricular Rate:  58 PR Interval:  209 QRS Duration: 99 QT Interval:  485 QTC Calculation: 477 R Axis:   -41 Text Interpretation: Sinus rhythm Left axis deviation RSR' in V1 or V2, probably normal variant Confirmed by Randal Buba, Grier Vu ([id number]) on 01/09/2023 4:48:06 AM  Radiology CT Renal Stone Study  Result Date: 01/09/2023 CLINICAL DATA:  Nausea and vomiting with abdominal pain, initial encounter EXAM: CT ABDOMEN AND PELVIS WITHOUT CONTRAST TECHNIQUE: Multidetector CT imaging of the abdomen and pelvis was performed following the standard protocol without IV contrast. RADIATION DOSE REDUCTION: This exam was performed according to the departmental dose-optimization program which includes automated exposure control, adjustment of the mA and/or kV according to patient size and/or use of iterative reconstruction technique. COMPARISON:  12/23/2015 FINDINGS: Lower chest:  Scarring is noted in the bases bilaterally. No focal infiltrate is seen. Bilateral breast implants are noted. Small subcutaneous nodule is noted along the posterior right chest wall likely related to a sebaceous cyst. Hepatobiliary: No focal liver abnormality is seen. Status post  cholecystectomy. No biliary dilatation. Pancreas: Unremarkable. No pancreatic ductal dilatation or surrounding inflammatory changes. Spleen: Normal in size without focal abnormality. Adrenals/Urinary Tract: Adrenal glands are within normal limits. Kidneys show a normal enhancement pattern. 1 cm hypodensity is noted in the left kidney consistent with small cyst. No further follow-up is recommended. No obstructive changes are seen. The bladder is decompressed. Stomach/Bowel: Scattered diverticular changes noted with very minimal pericolonic inflammatory change which may represent some early diverticulitis. The appendix is not well visualized and likely surgically removed. No small bowel or gastric abnormality is noted. Vascular/Lymphatic: Aortic atherosclerosis. No enlarged abdominal or pelvic lymph nodes. Reproductive: Status post hysterectomy. No adnexal masses. Other: No abdominal wall hernia or abnormality. No abdominopelvic ascites. Musculoskeletal: No acute or significant osseous findings. IMPRESSION: Diverticulosis with findings of very mild sigmoid diverticulitis. No perforation or abscess is identified. No renal calculi or obstructive changes are noted. Scarring in the lung bases bilaterally. Electronically Signed   By: Inez Catalina M.D.   On: 01/09/2023 01:18   CT Head Wo Contrast  Result Date: 01/09/2023 CLINICAL DATA:  Headaches and nausea and vomiting, initial encounter EXAM: CT HEAD WITHOUT CONTRAST TECHNIQUE: Contiguous axial images were obtained from the base of the skull through the vertex without intravenous contrast. RADIATION DOSE REDUCTION: This exam was performed according to the departmental dose-optimization program which includes automated exposure control, adjustment of the mA and/or kV according to patient size and/or use of iterative reconstruction technique. COMPARISON:  07/11/2018 FINDINGS: Brain: No evidence of acute infarction, hemorrhage, hydrocephalus, extra-axial collection or  mass lesion/mass effect. Vascular: No hyperdense vessel or unexpected calcification. Skull: Postsurgical changes are noted in the posterior right parietal region, stable from the prior exam. Mild hyperostosis is noted. Sinuses/Orbits: No acute finding. Other: None. IMPRESSION: No acute intracranial abnormality noted. Electronically Signed   By: Inez Catalina M.D.   On: 01/09/2023 01:13   DG Chest Portable 1 View  Result Date: 01/09/2023 CLINICAL DATA:  Shortness of breath EXAM: PORTABLE CHEST 1 VIEW COMPARISON:  03/04/2019 FINDINGS: Cardiomegaly, vascular congestion. Bibasilar airspace opacities. Possible small left effusion. No acute bony abnormality. IMPRESSION: Cardiomegaly with vascular congestion and bibasilar opacities which could reflect atelectasis or infiltrates. Possible small left effusion. Electronically Signed   By: Rolm Baptise M.D.   On: 01/09/2023 00:46    Procedures Procedures    Medications Ordered in ED Medications  azithromycin (ZITHROMAX) 500 mg in sodium chloride 0.9 % 250 mL IVPB (500 mg Intravenous New Bag/Given 01/09/23 0448)  magnesium sulfate IVPB 2 g 50 mL (2 g Intravenous New Bag/Given 01/09/23 0446)  cefTRIAXone (ROCEPHIN) 1 g in sodium chloride 0.9 % 100 mL IVPB (0 g Intravenous Stopped 01/09/23 0439)  acetaminophen (TYLENOL) tablet 1,000 mg (1,000 mg Oral Given 01/09/23 0406)     ED Course/ Medical Decision Making/ A&P                             Medical Decision Making Patient with nausea vomiting and diarrhea and SOB and cough and headache   Amount and/or Complexity of Data Reviewed Independent Historian: EMS    Details: See above  External Data Reviewed: notes.    Details: Previous notes reviewed  Labs: ordered.    Details: All labs reviewed: troponins 18/28 elevated.  Normal white count. Normal urine  Radiology: ordered and independent interpretation performed.    Details: PNA on CXR by me No free air on CT by me 8.6, normal hemoglobin platelets low 112.   Normal sodium 136, normal potassium 3.8, normal creatinine .78  and LFTs Discussion of management or test interpretation with external provider(s): Case d/w Dr. Marlowe Sax   Risk OTC drugs. Prescription drug management. Decision regarding hospitalization.    Final Clinical Impression(s) / ED Diagnoses Final diagnoses:  Pneumonia due to infectious organism, unspecified laterality, unspecified part of lung  Diverticulitis   The patient appears reasonably stabilized for admission considering the current resources, flow, and capabilities available in the ED at this time, and I doubt any other [facility] requiring further screening and/or treatment in the ED prior to admission.  Rx / DC Orders ED Discharge Orders     None         Biana Haggar, MD 01/09/23 [phone]

## 2023-01-09 NOTE — ED Notes (Signed)
The pt has been c/o everything her back hurts she's nauseated  she still has a headache  she's cold  blankets given  no further orders as yet

## 2023-01-09 NOTE — Progress Notes (Addendum)
ANTICOAGULATION CONSULT NOTE - Initial Consult  Pharmacy Consult for Coumadin and Zosyn Indication:  PAF/PE and diveriticulitis  Allergies  Allergen Reactions   Diclofenac Nausea Only   Atorvastatin Other (See Comments)    Muscle pain   Tape Other (See Comments)    Peels skin off (bandaids, too) unknown   Albuterol Other (See Comments)    Afib   Lasix [Furosemide]     Dizzy nausea   Mushroom Extract Complex Nausea And Vomiting   Tramadol Other (See Comments) and Nausea And Vomiting   Buprenorphine Hcl Nausea And Vomiting   Codeine Nausea And Vomiting   Fish Oil Nausea And Vomiting   Morphine And Related Nausea And Vomiting    Patient Measurements: Height: '5\' 8"'$  (172.7 cm) Weight: 102.5 kg (225 lb 15.5 oz) IBW/kg (Calculated) : 63.9  Vital Signs: Temp: 97.7 F (36.5 C) (03/04 0411) Temp Source: Oral (03/04 0411) BP: 143/61 (03/04 0500) Pulse Rate: 66 (03/04 0500)  Labs: Recent Labs    01/09/23 0040 01/09/23 0250 01/09/23 0420  HGB 14.5  --   --   HCT 43.4  --   --   PLT 112*  --   --   LABPROT  --   --  19.0*  INR  --   --  1.6*  CREATININE 0.78  --   --   TROPONINIHS 18* 28*  --     Estimated Creatinine Clearance: 74.9 mL/min (by C-G formula based on SCr of 0.78 mg/dL).   Medical History: Past Medical History:  Diagnosis Date   Arthritis    Atrial fibrillation (HCC)    Benign essential tremor    Breast cancer ([place])    Cataract    Bilateral 2018   Chronic renal insufficiency    Common migraine 0000000   Complication of anesthesia    Depression with anxiety    Drowsiness    Excessive daytime   Dyslipidemia    Fibromyalgia    History of partial seizures    Hypertension    Memory disturbance    Muscle tension headache    Obesity    Obstructive sleep apnea    PONV (postoperative nausea and vomiting)    S/P epidural steroid injection    Sacral contusion 07/2018   Seizures (HCC)    Partial   Sleep apnea    does not wear CPAP   Sprain of  neck 06/26/2013   Tremors of nervous system    Vitamin B12 deficiency     Assessment: 77yo female c/o N/V/D and HA, admitted for early diverticulitis and ?CAP, to continue warfarin for PAF and h/o PE; current INR below goal w/ last dose taken 3/3 PTA.  Pt started on ABX for IAI/CAP; d/w Dr Marlowe Sax who agrees to change ceftriaxone and metronidazole to Zosyn to prevent major interaction with warfarin.  Noted that pt has refused DOAC until available as generic.  Goal of Therapy:  INR 2-3   Plan:  Zosyn 3.375g IV Q8H. Coumadin '6mg'$  PO x1 today. Monitor INR for dose adjustments.  Wynona Neat, PharmD, BCPS  01/09/2023,6:26 AM

## 2023-01-09 NOTE — ED Triage Notes (Signed)
The pt arrived by [facility] from home   nausea vomiting headache and diarrhea since yesterday  ems gave her zofran '4mg'$  iv  iv per ems   cbg 177

## 2023-01-09 NOTE — H&P (Addendum)
History and Physical    Debbie [name] [id number] DOB: February 07, 1946 DOA: 01/09/2023  PCP: System, Provider Not In  Patient coming from: Home  Chief Complaint: Multiple complaints  HPI: Debbie [name] is a 77 y.o. female with medical history significant of persistent atrial fibrillation status post ablation now paroxysmal on warfarin, PE in 2017, hypertension, hyperlipidemia, obesity (BMI 34.36), sleep apnea not on CPAP, fibromyalgia, chronic migraine headaches, essential tremor, history of brain tumor in 2005, seizure disorder, history of breast cancer, depression and anxiety presents to the ED with multiple complaints including nausea, vomiting, diarrhea, abdominal pain, headaches, cough, and shortness of breath.  Slightly bradycardic with heart rate in the 50s to 60s.  Blood pressure 190/78 on arrival.  Afebrile.  Labs showing no leukocytosis or anemia, platelet count 112k (low on previous labs as well and stable), normal LFTs, troponin 18> 28, UA not suggestive of infection, INR 1.6.  EKG without acute ischemic changes.  Chest x-ray showing cardiomegaly with vascular congestion and bibasilar opacities which could reflect atelectasis or infiltrates.  Possible small left effusion.  CT head negative for acute finding.  CT renal stone study showing diverticulosis with findings of very mild sigmoid diverticulitis.  No perforation or abscess.  No renal calculi or obstructive changes.  Scarring in the lung bases bilaterally and no focal infiltrate seen. Patient received Tylenol, Zofran, ceftriaxone, azithromycin,, and IV magnesium 2 g.  [facility] called to admit.  Patient states she is feeling sick since noon yesterday.  She is endorsing nausea, vomiting, generalized abdominal pain, and diarrhea.  She has not been able to tolerate p.o. intake.  Having multiple episodes of diarrhea.  Reports chills, has not checked her temperature.  Also endorsing headaches, cough, shortness of breath, and substernal chest  pain.  Denies any recent sick contacts.  Review of Systems:  Review of Systems  All other systems reviewed and are negative.   Past Medical History:  Diagnosis Date   Arthritis    Atrial fibrillation (HCC)    Benign essential tremor    Breast cancer ([place])    Cataract    Bilateral 2018   Chronic renal insufficiency    Common migraine 0000000   Complication of anesthesia    Depression with anxiety    Drowsiness    Excessive daytime   Dyslipidemia    Fibromyalgia    History of partial seizures    Hypertension    Memory disturbance    Muscle tension headache    Obesity    Obstructive sleep apnea    PONV (postoperative nausea and vomiting)    S/P epidural steroid injection    Sacral contusion 07/2018   Seizures (HCC)    Partial   Sleep apnea    does not wear CPAP   Sprain of neck 06/26/2013   Tremors of nervous system    Vitamin B12 deficiency     Past Surgical History:  Procedure Laterality Date   ABDOMINAL HYSTERECTOMY     ablation procedure     Cardiac, for Afib   BASAL CELL CARCINOMA EXCISION     BASAL CELL CARCINOMA EXCISION     BRAIN MENINGIOMA EXCISION Right    BREAST RECONSTRUCTION Bilateral    breast reconstructive     CATARACT EXTRACTION, BILATERAL     CHOLECYSTECTOMY     DILATION AND CURETTAGE OF UTERUS     dilution     ESOPHAGEAL MANOMETRY N/A 12/14/2016   Procedure: ESOPHAGEAL MANOMETRY (EM);  Surgeon: Mauri Pole, MD;  Location: [facility];  Service: Endoscopy;  Laterality: N/A;   gallbladder resection     LUMBAR SPINE SURGERY N/A    lumbosacral     mastectomies Bilateral    NOSE SURGERY     Basal cell carcinoma resection from the nose   TUBAL LIGATION N/A      reports that she has never smoked. She has never used smokeless tobacco. She reports that she does not drink alcohol and does not use drugs.  Allergies  Allergen Reactions   Diclofenac Nausea Only   Atorvastatin Other (See Comments)    Muscle pain   Tape Other (See  Comments)    Peels skin off (bandaids, too) unknown   Albuterol Other (See Comments)    Afib   Lasix [Furosemide]     Dizzy nausea   Mushroom Extract Complex Nausea And Vomiting   Tramadol Other (See Comments) and Nausea And Vomiting   Buprenorphine Hcl Nausea And Vomiting   Codeine Nausea And Vomiting   Fish Oil Nausea And Vomiting   Morphine And Related Nausea And Vomiting    Family History  Problem Relation Age of Onset   Heart failure Mother    Diabetes Mother    Arthritis Mother    Hyperlipidemia Mother    Heart disease Mother    Stroke Mother    Hypertension Mother    Cancer - Colon Father    Colon cancer Father        late 93's   Arthritis Father    Cancer Father    Breast cancer Sister    Diabetes Maternal Grandfather    Heart failure Maternal Grandfather    Stroke Maternal Grandmother    Rectal cancer Maternal Uncle    Lung disease Neg Hx    Esophageal cancer Neg Hx    Stomach cancer Neg Hx     Prior to Admission medications   Medication Sig Start Date End Date Taking? Authorizing Provider  acetaminophen (TYLENOL) 500 MG tablet Take 500 mg by mouth in the morning, at noon, and at bedtime.   Yes [provider]  amiodarone (PACERONE) 100 MG tablet Take 1 tablet (100 mg total) by mouth daily. 03/26/21  Yes Buford Dresser, MD  Ascorbic Acid (VITAMIN C PO) Take 282 mg by mouth daily.    Yes [provider]  calcium carbonate (OS-CAL) 600 MG TABS tablet Take 600 mg by mouth daily with breakfast. With vitamin D 800 units   Yes [provider]  gabapentin (NEURONTIN) 600 MG tablet Take 1,500 mg by mouth 3 (three) times daily.   Yes [provider]  HYDROcodone-acetaminophen (NORCO) 7.5-325 MG tablet Take 1 tablet by mouth 3 (three) times daily as needed for moderate pain. 03/07/17  Yes [provider]  lactulose (CHRONULAC) 10 GM/15ML solution Take 47m by mouth daily as needed for constipation   Yes [provider]  lamoTRIgine (LAMICTAL) 25 MG tablet Take 1 tablet (25 mg total) by mouth 2 (two) times daily. 07/06/20  Yes SSuzzanne Cloud NP  LEVALBUTEROL HCL IN Inhale into the lungs daily as needed (for shortness of breath).   Yes [provider]  ondansetron (ZOFRAN) 4 MG tablet Take 4 mg by mouth every 8 (eight) hours as needed for nausea or vomiting. 01/08/23  Yes [provider]  polyethylene glycol (MIRALAX / GLYCOLAX) packet Take 4.25 g by mouth daily. Mix 1/4 capful with beverage of choice and drink daily   Yes [provider]  promethazine (  PHENERGAN) 25 MG tablet Take 6.25 mg by mouth every 6 (six) hours as needed for nausea or vomiting.   Yes [provider]  vitamin B-12 (CYANOCOBALAMIN) 1000 MCG tablet Take 1,000 mcg by mouth daily.   Yes [provider]  Vitamin D, Ergocalciferol, (DRISDOL) 1.25 MG (50000 UNIT) CAPS capsule TAKE 1 CAPSULE BY MOUTH EVERY 10 DAYS Patient taking differently: Take 50,000 Units by mouth See admin instructions. TAKE 1 CAPSULE BY MOUTH EVERY 10 DAYS 09/28/20  Yes [name], [name], MD  warfarin (COUMADIN) 5 MG tablet TAKE 1/2 to 1 TABLET BY MOUTH DAILY AS DIRECTED BY ANTICOAGULATION CLINIC Patient taking differently: Take 2.5-5 mg by mouth See admin instructions. Take 1 tablet by mouth once daily every Sunday, Tuesday, Thursday and one half tablet (2.'5mg'$ ) all other days or as [place] 07/28/22  Yes Buford Dresser, MD  amiodarone (PACERONE) 200 MG tablet TAKE ONE TABLET BY MOUTH DAILY (TO START AFTER THE 28 DAY DOSE) Patient not taking: Reported on 01/09/2023 12/26/22   Skeet Latch, MD    Physical Exam: Vitals:   01/09/23 0315 01/09/23 0330 01/09/23 0411 01/09/23 0415  BP: (!) 163/57   (!) 157/90  Pulse:  64  (!) 59  Resp: '16 14  20  '$ Temp:   97.7 F (36.5 C)   TempSrc:   Oral   SpO2: 98% 97%  96%  Weight:      Height:        Physical Exam Vitals reviewed.   Constitutional:      Appearance: She is ill-appearing.  HENT:     Head: Normocephalic and atraumatic.     Mouth/Throat:     Mouth: Mucous membranes are dry.  Eyes:     Extraocular Movements: Extraocular movements intact.  Cardiovascular:     Rate and Rhythm: Normal rate and regular rhythm.     Pulses: Normal pulses.  Pulmonary:     Effort: Pulmonary effort is normal. No respiratory distress.     Breath sounds: No wheezing.  Abdominal:     General: Bowel sounds are normal.     Palpations: Abdomen is soft.     Tenderness: There is abdominal tenderness. There is no guarding.     Comments: Generalized tenderness to palpation  Musculoskeletal:     Cervical back: Normal range of motion.     Right lower leg: No edema.     Left lower leg: No edema.  Skin:    General: Skin is warm and dry.  Neurological:     General: No focal deficit present.     Mental Status: She is alert and oriented to person, place, and time.     Labs on Admission: I have personally reviewed following labs and imaging studies  CBC: Recent Labs  Lab 01/09/23 0040  WBC 8.6  NEUTROABS 7.6  HGB 14.5  HCT 43.4  MCV 94.6  PLT [id number]*   Basic Metabolic Panel: Recent Labs  Lab 01/09/23 0040  NA 136  K 3.8  CL 97*  CO2 25  GLUCOSE 153*  BUN 11  CREATININE 0.78  CALCIUM 9.4   GFR: Estimated Creatinine Clearance: 74.9 mL/min (by C-G formula based on SCr of 0.78 mg/dL). Liver Function Tests: Recent Labs  Lab 01/09/23 0040  AST 26  ALT 17  ALKPHOS 56  BILITOT 1.2  PROT 7.5  ALBUMIN 3.9   No results for input(s): "LIPASE", "AMYLASE" in the last 168 hours. No results for input(s): "AMMONIA" in the last 168  hours. Coagulation Profile: Recent Labs  Lab 01/09/23 0420  INR 1.6*   Cardiac Enzymes: No results for input(s): "CKTOTAL", "CKMB", "CKMBINDEX", "TROPONINI" in the last 168 hours. BNP (last 3 results) No results for input(s): "PROBNP" in the last 8760 hours. HbA1C: No results for  input(s): "HGBA1C" in the last 72 hours. CBG: No results for input(s): "GLUCAP" in the last 168 hours. Lipid Profile: No results for input(s): "CHOL", "HDL", "LDLCALC", "TRIG", "CHOLHDL", "LDLDIRECT" in the last 72 hours. Thyroid Function Tests: No results for input(s): "TSH", "T4TOTAL", "FREET4", "T3FREE", "THYROIDAB" in the last 72 hours. Anemia Panel: No results for input(s): "VITAMINB12", "FOLATE", "FERRITIN", "TIBC", "IRON", "RETICCTPCT" in the last 72 hours. Urine analysis:    Component Value Date/Time   COLORURINE YELLOW 01/09/2023 0358   APPEARANCEUR CLEAR 01/09/2023 0358   LABSPEC 1.018 01/09/2023 0358   PHURINE 7.0 01/09/2023 0358   GLUCOSEU NEGATIVE 01/09/2023 0358   HGBUR NEGATIVE 01/09/2023 0358   BILIRUBINUR NEGATIVE 01/09/2023 0358   KETONESUR NEGATIVE 01/09/2023 0358   PROTEINUR 100 (A) 01/09/2023 0358   NITRITE NEGATIVE 01/09/2023 0358   LEUKOCYTESUR NEGATIVE 01/09/2023 0358    Radiological Exams on Admission: CT Renal Stone Study  Result Date: 01/09/2023 CLINICAL DATA:  Nausea and vomiting with abdominal pain, initial encounter EXAM: CT ABDOMEN AND PELVIS WITHOUT CONTRAST TECHNIQUE: Multidetector CT imaging of the abdomen and pelvis was performed following the standard protocol without IV contrast. RADIATION DOSE REDUCTION: This exam was performed according to the departmental dose-optimization program which includes automated exposure control, adjustment of the mA and/or kV according to patient size and/or use of iterative reconstruction technique. COMPARISON:  12/23/2015 FINDINGS: Lower chest: Scarring is noted in the bases bilaterally. No focal infiltrate is seen. Bilateral breast implants are noted. Small subcutaneous nodule is noted along the posterior right chest wall likely related to a sebaceous cyst. Hepatobiliary: No focal liver abnormality is seen. Status post cholecystectomy. No biliary dilatation. Pancreas: Unremarkable. No pancreatic ductal dilatation or  surrounding inflammatory changes. Spleen: Normal in size without focal abnormality. Adrenals/Urinary Tract: Adrenal glands are within normal limits. Kidneys show a normal enhancement pattern. 1 cm hypodensity is noted in the left kidney consistent with small cyst. No further follow-up is recommended. No obstructive changes are seen. The bladder is decompressed. Stomach/Bowel: Scattered diverticular changes noted with very minimal pericolonic inflammatory change which may represent some early diverticulitis. The appendix is not well visualized and likely surgically removed. No small bowel or gastric abnormality is noted. Vascular/Lymphatic: Aortic atherosclerosis. No enlarged abdominal or pelvic lymph nodes. Reproductive: Status post hysterectomy. No adnexal masses. Other: No abdominal wall hernia or abnormality. No abdominopelvic ascites. Musculoskeletal: No acute or significant osseous findings. IMPRESSION: Diverticulosis with findings of very mild sigmoid diverticulitis. No perforation or abscess is identified. No renal calculi or obstructive changes are noted. Scarring in the lung bases bilaterally. Electronically Signed   By: Inez Catalina M.D.   On: 01/09/2023 01:18   CT Head Wo Contrast  Result Date: 01/09/2023 CLINICAL DATA:  Headaches and nausea and vomiting, initial encounter EXAM: CT HEAD WITHOUT CONTRAST TECHNIQUE: Contiguous axial images were obtained from the base of the skull through the vertex without intravenous contrast. RADIATION DOSE REDUCTION: This exam was performed according to the departmental dose-optimization program which includes automated exposure control, adjustment of the mA and/or kV according to patient size and/or use of iterative reconstruction technique. COMPARISON:  07/11/2018 FINDINGS: Brain: No evidence of acute infarction, hemorrhage, hydrocephalus, extra-axial collection or mass lesion/mass effect. Vascular: No hyperdense vessel  or unexpected calcification. Skull:  Postsurgical changes are noted in the posterior right parietal region, stable from the prior exam. Mild hyperostosis is noted. Sinuses/Orbits: No acute finding. Other: None. IMPRESSION: No acute intracranial abnormality noted. Electronically Signed   By: Inez Catalina M.D.   On: 01/09/2023 01:13   DG Chest Portable 1 View  Result Date: 01/09/2023 CLINICAL DATA:  Shortness of breath EXAM: PORTABLE CHEST 1 VIEW COMPARISON:  03/04/2019 FINDINGS: Cardiomegaly, vascular congestion. Bibasilar airspace opacities. Possible small left effusion. No acute bony abnormality. IMPRESSION: Cardiomegaly with vascular congestion and bibasilar opacities which could reflect atelectasis or infiltrates. Possible small left effusion. Electronically Signed   By: Rolm Baptise M.D.   On: 01/09/2023 00:46    EKG: Independently reviewed.  Sinus bradycardia with first-degree AV block, RSR' in V1 and V2.  No significant change since prior tracing.  Assessment and Plan  Abdominal pain, vomiting, diarrhea  Early diverticulitis Patient presenting with complaints of nausea, vomiting, abdominal pain, and diarrhea.  LFTs normal.  UA not suggestive of infection. CT showing diverticulosis with findings of very mild sigmoid diverticulitis. No perforation or abscess. Patient is afebrile and labs showing no leukocytosis.  Continue antibiotic coverage with Zosyn.  Avoiding metronidazole as she is on warfarin.  Antiemetic as needed, pain management.  Start IV fluids if BNP normal.  C. difficile PCR and GI pathogen panel, enteric precautions.  ?Community-acquired pneumonia Patient complaining of cough and shortness of breath.  Currently satting 93-96% on room air.  Not tachypneic.  Chest x-ray showing cardiomegaly with vascular congestion and bibasilar opacities which could reflect atelectasis or infiltrates.  Possible small left effusion.  CT renal stone study showing scarring in the lung bases bilaterally and no focal infiltrate seen.  Patient  is afebrile and labs showing no leukocytosis.  Echo done in April 2020 showing EF 55 to 60%.  Continue antibiotic coverage with Zosyn.  Check procalcitonin and BNP. Test for COVID/influenza/RSV.  Continuous pulse ox, supplemental oxygen as needed to keep oxygen saturation above 94%.  Chest pain ACS less likely as troponin mildly elevated (18> 28) and EKG without acute ischemic changes.  Continue cardiac monitoring and trend troponin.  History of chronic migraine headaches CT head negative for acute finding and no focal neurodeficit on exam.  Continue symptomatic management.  Paroxysmal atrial fibrillation Current rhythm is sinus bradycardia, heart rate in the high 50s to low 60s.  EKG showing first-degree AV block.  No beta-blockers or calcium channel blockers listed in home medications.  Continue amiodarone.  She is on warfarin but INR subtherapeutic at 1.6.  Pharmacy consulted for warfarin dosing.  Check TSH.  Mild thrombocytopenia Platelet count stable since labs done in January.  No signs of bleeding.  Monitor CBC.  Hypertension Systolic currently in the 140s to 150s.  IV hydralazine PRN.  Seizure disorder Continue lamotrigine.  DVT prophylaxis: Warfarin Code Status: DNR/DNI (discussed with the patient) Family Communication: Husband and daughter at bedside. Level of care: Telemetry bed Admission status: It is my clinical opinion that referral for OBSERVATION is reasonable and necessary in this patient based on the above information provided. The aforementioned taken together are felt to place the patient at high risk for further clinical deterioration. However, it is anticipated that the patient may be medically stable for discharge from the hospital within 24 to 48 hours.   Shela Leff MD [facility]  If 7PM-7AM, please contact night-coverage [email or web address]  01/09/2023, 4:59 AM

## 2023-01-10 LAB — CBC
HCT: 41 % (ref 36.0–46.0)
Hemoglobin: 13.5 g/dL (ref 12.0–15.0)
MCH: 31.2 pg (ref 26.0–34.0)
MCHC: 32.9 g/dL (ref 30.0–36.0)
MCV: 94.7 fL (ref 80.0–100.0)
Platelets: 114 10*3/uL — ABNORMAL LOW (ref 150–400)
RBC: 4.33 MIL/uL (ref 3.87–5.11)
RDW: 14 % (ref 11.5–15.5)
WBC: 7.4 10*3/uL (ref 4.0–10.5)
nRBC: 0 % (ref 0.0–0.2)

## 2023-01-10 LAB — PROTIME-INR
INR: 2.2 — ABNORMAL HIGH (ref 0.8–1.2)
Prothrombin Time: 24.3 seconds — ABNORMAL HIGH (ref 11.4–15.2)

## 2023-01-10 MED ORDER — WARFARIN SODIUM 2 MG PO TABS
4.0000 mg | ORAL_TABLET | Freq: Once | ORAL | Status: AC
  Administered 2023-01-10: 4 mg via ORAL
  Filled 2023-01-10: qty 2

## 2023-01-10 NOTE — Progress Notes (Signed)
The patient is complaining of generalized pain of 7/10 on a pain scale. PRN pain med is due in 20 minutes. Patient notified of the timing ans she's in agreement.

## 2023-01-10 NOTE — Progress Notes (Addendum)
PROGRESS NOTE    [name]  [id number] DOB: Oct 26, 1946 DOA: 01/09/2023 PCP: System, Provider Not In   Brief Narrative: Debbie [name] is a 77 y.o. female with a history of paroxysmal atrial fibrillation, PE, hypertension, hyperlipidemia, obesity, sleep apnea, fibromyalgia, chronic migraine headaches, primary hypertension, brain tumor, seizure disorder, breast cancer, depression, anxiety. Patient presented secondary to nausea, vomiting, diarrhea, abdominal pain, headaches, cough and dyspnea. Initial workup concerning for diverticulitis with questionable presence of pneumonia. Empiric antibiotics started. Workup negative for C. Difficile and other GI pathogens.   Assessment and Plan:  Acute diverticulitis Mild disease noted on CT abdomen/pelvis. Associated abdominal pain. No GI bleed noted. Patient started empirically on Zosyn IV. -Continue Zosyn IV  Abdominal pain Nausea/vomiting/diarrhea Presumed secondary to acute infection. C. Difficile testing negative. Gi pathogen panel negative. Symptoms improved. -Continue analgesics -Continue Zofran; Phenergan for breakthrough -Discontinue NS IV fluids -Advance to soft diet  Abnormal chest imaging Concern for possible pneumonia on admission. Patient with non-productive cough. No other symptoms. Patient without hypoxia. -Incentive spirometer  Chest pain Atypical. Mild troponin rise, non-exertional. EKG significant for no ischemic changes. No current symptoms. Troponin elevation not consistent with ACS; likely demand ischemia.  Chronic migraine headaches Noted. Patient with current headache -Continue home Norco PRN  Paroxysmal atrial fibrillation Currently in sinus rhythm with some mild bradycardia intermittently. On Coumadin as an outpatient. On amiodarone for rhythm control -Continue amiodarone -Continue Coumadin  Primary hypertension Uncontrolled during admission. Patient started on hydralazine PRN.    Thrombocytopenia Mild. Platelets of 112,000 on admission. Likely secondary to acute infection. Stable.  Seizure disorder -Continue lamotrigine  DVT prophylaxis: Coumadin Code Status:   Code Status: DNR Family Communication: None at bedside Disposition Plan: Discharge home likely in 1-2 days pending ability to tolerate oral diet and transition to oral antibiotics   Consultants:  None  Procedures:  None  Antimicrobials: Zosyn IV Azithromycin    Subjective: Abdominal pain still present but improved. Pain is in upper abdomen but also in RLQ  Objective: BP (!) 149/50 (BP Location: Right Arm)   Pulse (!) 56   Temp 98.1 F (36.7 C) (Oral)   Resp 18   Ht '5\' 8"'$  (1.727 m)   Wt 103.4 kg   SpO2 96%   BMI 34.66 kg/m   Examination:  General exam: Appears calm and comfortable  Respiratory system: Clear to auscultation. Respiratory effort normal. Cardiovascular system: S1 & S2 heard, RRR. Gastrointestinal system: Abdomen is nondistended, soft and tender in RLQ. Normal bowel sounds heard. Central nervous system: Alert and oriented. No focal neurological deficits. Musculoskeletal: No calf tenderness Skin: No cyanosis. No rashes Psychiatry: Judgement and insight appear normal. Mood & affect appropriate.    Data Reviewed: I have personally reviewed following labs and imaging studies  CBC Lab Results  Component Value Date   WBC 7.4 01/10/2023   RBC 4.33 01/10/2023   HGB 13.5 01/10/2023   HCT 41.0 01/10/2023   MCV 94.7 01/10/2023   MCH 31.2 01/10/2023   PLT 114 (L) 01/10/2023   MCHC 32.9 01/10/2023   RDW 14.0 01/10/2023   LYMPHSABS 0.7 01/09/2023   MONOABS 0.2 01/09/2023   EOSABS 0.0 01/09/2023   BASOSABS 0.0 [id number]     Last metabolic panel Lab Results  Component Value Date   NA 136 01/09/2023   K 3.6 01/09/2023   CL 99 01/09/2023   CO2 22 01/09/2023   BUN 9 01/09/2023   CREATININE 0.85 01/09/2023   GLUCOSE 131 (H) 01/09/2023  GFRNONAA >60  01/09/2023   GFRAA 82 07/29/2020   CALCIUM 8.9 01/09/2023   PROT 7.5 01/09/2023   ALBUMIN 3.9 01/09/2023   LABGLOB 2.6 04/19/2018   AGRATIO 1.6 04/19/2018   BILITOT 1.2 01/09/2023   ALKPHOS 56 01/09/2023   AST 26 01/09/2023   ALT 17 01/09/2023   ANIONGAP 15 01/09/2023    GFR: Estimated Creatinine Clearance: 70.8 mL/min (by C-G formula based on SCr of 0.85 mg/dL).  Recent Results (from the past 240 hour(s))  Resp panel by RT-PCR (RSV, Flu A&B, Covid) Anterior Nasal Swab     Status: None   Collection Time: 01/09/23  6:43 AM   Specimen: Anterior Nasal Swab  Result Value Ref Range Status   SARS Coronavirus 2 by RT PCR NEGATIVE NEGATIVE Final   Influenza A by PCR NEGATIVE NEGATIVE Final   Influenza B by PCR NEGATIVE NEGATIVE Final    Comment: (NOTE) The Xpert Xpress SARS-CoV-2/FLU/RSV plus assay is intended as an aid in the diagnosis of influenza from Nasopharyngeal swab specimens and should not be used as a sole basis for treatment. Nasal washings and aspirates are unacceptable for Xpert Xpress SARS-CoV-2/FLU/RSV testing.  Fact Sheet for Patients: EntrepreneurPulse.com.au  Fact Sheet for Healthcare Providers: IncredibleEmployment.be  This test is not yet approved or cleared by the [place] FDA and has been authorized for detection and/or diagnosis of SARS-CoV-2 by FDA under an Emergency Use Authorization (EUA). This EUA will remain in effect (meaning this test can be used) for the duration of the COVID-19 declaration under Section 564(b)(1) of the Act, 21 U.S.C. section 360bbb-3(b)(1), unless the authorization is terminated or revoked.     Resp Syncytial Virus by PCR NEGATIVE NEGATIVE Final    Comment: (NOTE) Fact Sheet for Patients: EntrepreneurPulse.com.au  Fact Sheet for Healthcare Providers: IncredibleEmployment.be  This test is not yet approved or cleared by the [place] FDA  and has been authorized for detection and/or diagnosis of SARS-CoV-2 by FDA under an Emergency Use Authorization (EUA). This EUA will remain in effect (meaning this test can be used) for the duration of the COVID-19 declaration under Section 564(b)(1) of the Act, 21 U.S.C. section 360bbb-3(b)(1), unless the authorization is terminated or revoked.  Performed at [facility], [place] [address] [place], [place] [address]   C Difficile Quick Screen w PCR reflex     Status: None   Collection Time: 01/09/23  6:43 AM   Specimen: Stool  Result Value Ref Range Status   C Diff antigen NEGATIVE NEGATIVE Final   C Diff toxin NEGATIVE NEGATIVE Final   C Diff interpretation No C. difficile detected.  Final    Comment: Performed at [facility], [place] [address] [place], [place] [address]  Gastrointestinal Panel by PCR , Stool     Status: None   Collection Time: 01/09/23  6:43 AM   Specimen: Stool  Result Value Ref Range Status   Campylobacter species NOT DETECTED NOT DETECTED Final   Plesimonas shigelloides NOT DETECTED NOT DETECTED Final   Salmonella species NOT DETECTED NOT DETECTED Final   Yersinia enterocolitica NOT DETECTED NOT DETECTED Final   Vibrio species NOT DETECTED NOT DETECTED Final   Vibrio cholerae NOT DETECTED NOT DETECTED Final   Enteroaggregative E coli (EAEC) NOT DETECTED NOT DETECTED Final   Enteropathogenic E coli (EPEC) NOT DETECTED NOT DETECTED Final   Enterotoxigenic E coli (ETEC) NOT DETECTED NOT DETECTED Final   Shiga like toxin producing E coli (STEC) NOT DETECTED NOT DETECTED  Final   Shigella/Enteroinvasive E coli (EIEC) NOT DETECTED NOT DETECTED Final   Cryptosporidium NOT DETECTED NOT DETECTED Final   Cyclospora cayetanensis NOT DETECTED NOT DETECTED Final   Entamoeba histolytica NOT DETECTED NOT DETECTED Final   Giardia lamblia NOT DETECTED NOT DETECTED Final   Adenovirus F40/41 NOT DETECTED NOT DETECTED Final   Astrovirus NOT DETECTED NOT DETECTED  Final   Norovirus GI/GII NOT DETECTED NOT DETECTED Final   Rotavirus A NOT DETECTED NOT DETECTED Final   Sapovirus (I, II, IV, and V) NOT DETECTED NOT DETECTED Final    Comment: Performed at [facility], [address], [place], [place] [address]      Radiology Studies: CT Renal Stone Study  Result Date: 01/09/2023 CLINICAL DATA:  Nausea and vomiting with abdominal pain, initial encounter EXAM: CT ABDOMEN AND PELVIS WITHOUT CONTRAST TECHNIQUE: Multidetector CT imaging of the abdomen and pelvis was performed following the standard protocol without IV contrast. RADIATION DOSE REDUCTION: This exam was performed according to the departmental dose-optimization program which includes automated exposure control, adjustment of the mA and/or kV according to patient size and/or use of iterative reconstruction technique. COMPARISON:  12/23/2015 FINDINGS: Lower chest: Scarring is noted in the bases bilaterally. No focal infiltrate is seen. Bilateral breast implants are noted. Small subcutaneous nodule is noted along the posterior right chest wall likely related to a sebaceous cyst. Hepatobiliary: No focal liver abnormality is seen. Status post cholecystectomy. No biliary dilatation. Pancreas: Unremarkable. No pancreatic ductal dilatation or surrounding inflammatory changes. Spleen: Normal in size without focal abnormality. Adrenals/Urinary Tract: Adrenal glands are within normal limits. Kidneys show a normal enhancement pattern. 1 cm hypodensity is noted in the left kidney consistent with small cyst. No further follow-up is recommended. No obstructive changes are seen. The bladder is decompressed. Stomach/Bowel: Scattered diverticular changes noted with very minimal pericolonic inflammatory change which may represent some early diverticulitis. The appendix is not well visualized and likely surgically removed. No small bowel or gastric abnormality is noted. Vascular/Lymphatic: Aortic atherosclerosis. No  enlarged abdominal or pelvic lymph nodes. Reproductive: Status post hysterectomy. No adnexal masses. Other: No abdominal wall hernia or abnormality. No abdominopelvic ascites. Musculoskeletal: No acute or significant osseous findings. IMPRESSION: Diverticulosis with findings of very mild sigmoid diverticulitis. No perforation or abscess is identified. No renal calculi or obstructive changes are noted. Scarring in the lung bases bilaterally. Electronically Signed   By: Inez Catalina M.D.   On: 01/09/2023 01:18   CT Head Wo Contrast  Result Date: 01/09/2023 CLINICAL DATA:  Headaches and nausea and vomiting, initial encounter EXAM: CT HEAD WITHOUT CONTRAST TECHNIQUE: Contiguous axial images were obtained from the base of the skull through the vertex without intravenous contrast. RADIATION DOSE REDUCTION: This exam was performed according to the departmental dose-optimization program which includes automated exposure control, adjustment of the mA and/or kV according to patient size and/or use of iterative reconstruction technique. COMPARISON:  07/11/2018 FINDINGS: Brain: No evidence of acute infarction, hemorrhage, hydrocephalus, extra-axial collection or mass lesion/mass effect. Vascular: No hyperdense vessel or unexpected calcification. Skull: Postsurgical changes are noted in the posterior right parietal region, stable from the prior exam. Mild hyperostosis is noted. Sinuses/Orbits: No acute finding. Other: None. IMPRESSION: No acute intracranial abnormality noted. Electronically Signed   By: Inez Catalina M.D.   On: 01/09/2023 01:13   DG Chest Portable 1 View  Result Date: 01/09/2023 CLINICAL DATA:  Shortness of breath EXAM: PORTABLE CHEST 1 VIEW COMPARISON:  03/04/2019 FINDINGS: Cardiomegaly, vascular congestion. Bibasilar airspace  opacities. Possible small left effusion. No acute bony abnormality. IMPRESSION: Cardiomegaly with vascular congestion and bibasilar opacities which could reflect atelectasis or  infiltrates. Possible small left effusion. Electronically Signed   By: Rolm Baptise M.D.   On: 01/09/2023 00:46      LOS: 1 day    Cordelia Poche, MD [facility] 01/10/2023, 3:09 PM   If 7PM-7AM, please contact night-coverage [email or web address]

## 2023-01-10 NOTE — Evaluation (Signed)
Physical Therapy Evaluation Patient Details Name: Debbie [name] MRN: [id number] DOB: Jan 12, 1946 Today's Date: 01/10/2023  History of Present Illness  Debbie [name] is a 77 yo female who presented with nausea, vomiting, diarrhea, abdominal pain, headaches, cough, and shortness of breath. Chest x-ray showing cardiomegaly with vascular congestion and bibasilar opacities which could reflect atelectasis or infiltrates.  Possible small left effusion.  CT head negative for acute finding.  CT renal stone study showing diverticulosis with findings of very mild sigmoid diverticulitis. PMHx: persistent atrial fibrillation status post ablation now paroxysmal on warfarin, PE in 2017, hypertension, hyperlipidemia, obesity (BMI 34.36), sleep apnea not on CPAP, fibromyalgia, chronic migraine headaches, essential tremor, history of brain tumor in 2005, seizure disorder, history of breast cancer, depression and anxiety  Clinical Impression  Pt presents with admitting diagnosis above. Pt was able to ambulate in hallway today with RW Min A. Pt was a bit unsteady during gait however was mostly able to self correct. Anticipate that pt will be able to DC home with HHPT once pt progresses gait and completes stair training. Pt would also benefit from a mobility specialist referral during acute stay.        Recommendations for follow up therapy are one component of a multi-disciplinary discharge planning process, led by the attending physician.  Recommendations may be updated based on patient status, additional functional criteria and insurance authorization.  Follow Up Recommendations Home health PT      Assistance Recommended at Discharge Intermittent Supervision/Assistance  Patient can return home with the following  A little help with walking and/or transfers;A little help with bathing/dressing/bathroom;Assistance with cooking/housework;Assist for transportation;Help with stairs or ramp for entrance     Equipment Recommendations None recommended by PT (Has needed DME)  Recommendations for Other Services       Functional Status Assessment Patient has had a recent decline in their functional status and demonstrates the ability to make significant improvements in function in a reasonable and predictable amount of time.     Precautions / Restrictions Precautions Precautions: Fall Restrictions Weight Bearing Restrictions: No      Mobility  Bed Mobility Overal bed mobility: Needs Assistance Bed Mobility: Supine to Sit     Supine to sit: Supervision          Transfers Overall transfer level: Needs assistance Equipment used: Rolling walker (2 wheels) Transfers: Sit to/from Stand Sit to Stand: Min guard                Ambulation/Gait Ambulation/Gait assistance: Min assist Gait Distance (Feet): 100 Feet Assistive device: Rolling walker (2 wheels) Gait Pattern/deviations: Step-to pattern, Decreased stride length, Drifts right/left, Wide base of support Gait velocity: decreased     General Gait Details: Few minor LOB noted however pt was mostly able to self correct.  Stairs            Wheelchair Mobility    Modified Rankin (Stroke Patients Only)       Balance Overall balance assessment: Mild deficits observed, not formally tested                                           Pertinent Vitals/Pain Pain Assessment Pain Assessment: 0-10 Pain Score: 3  Pain Location: R side Pain Descriptors / Indicators: Aching, Sore Pain Intervention(s): Monitored during session, Patient requesting pain meds-RN notified    Home Living Family/patient expects to  be discharged to:: Private residence Living Arrangements: Spouse/significant other;Children (Daughter) Available Help at Discharge: Family;Available 24 hours/day Type of Home: House Home Access: Stairs to enter Entrance Stairs-Rails: Psychiatric nurse of Steps: 4   Home  Layout: One level Home Equipment: Conservation officer, nature (2 wheels);Cane - single point;BSC/3in1;Tub bench;Grab bars - toilet;Grab bars - tub/shower Additional Comments: daughter and son in law live in basement and provide 24/7 assist    Prior Function Prior Level of Function : Independent/Modified Independent;Needs assist       Physical Assist : ADLs (physical)     Mobility Comments: RW vs SPC ADLs Comments: generally mod I, daughter assists when needed     Hand Dominance   Dominant Hand: Right    Extremity/Trunk Assessment   Upper Extremity Assessment Upper Extremity Assessment: Overall WFL for tasks assessed    Lower Extremity Assessment Lower Extremity Assessment: Overall WFL for tasks assessed       Communication   Communication: No difficulties  Cognition Arousal/Alertness: Awake/alert Behavior During Therapy: WFL for tasks assessed/performed Overall Cognitive Status: Within Functional Limits for tasks assessed                                          General Comments General comments (skin integrity, edema, etc.): VSS on RA    Exercises     Assessment/Plan    PT Assessment Patient needs continued PT services  PT Problem List Decreased strength;Decreased activity tolerance;Decreased balance;Decreased mobility;Decreased coordination;Decreased knowledge of use of DME;Decreased safety awareness;Cardiopulmonary status limiting activity;Pain       PT Treatment Interventions DME instruction;Gait training;Stair training;Functional mobility training;Therapeutic activities;Therapeutic exercise;Balance training;Neuromuscular re-education;Patient/family education    PT Goals (Current goals can be found in the Care Plan section)  Acute Rehab PT Goals Patient Stated Goal: To get stronger PT Goal Formulation: With patient Time For Goal Achievement: 01/24/23 Potential to Achieve Goals: Good    Frequency Min 3X/week     Co-evaluation                AM-PAC PT "6 Clicks" Mobility  Outcome Measure Help needed turning from your back to your side while in a flat bed without using bedrails?: None Help needed moving from lying on your back to sitting on the side of a flat bed without using bedrails?: None Help needed moving to and from a bed to a chair (including a wheelchair)?: A Little Help needed standing up from a chair using your arms (e.g., wheelchair or bedside chair)?: A Little Help needed to walk in hospital room?: A Little Help needed climbing 3-5 steps with a railing? : A Lot 6 Click Score: 19    End of Session Equipment Utilized During Treatment: Gait belt Activity Tolerance: Patient tolerated treatment well Patient left: in chair;with call bell/phone within reach;with nursing/sitter in room Nurse Communication: Mobility status PT Visit Diagnosis: Other abnormalities of gait and mobility (R26.89);Unsteadiness on feet (R26.81)    Time: [id number] PT Time Calculation (min) (ACUTE ONLY): 26 min   Charges:   PT Evaluation $PT Eval Moderate Complexity: 1 Mod PT Treatments $Gait Training: 8-22 mins        Shelby Mattocks, PT, DPT Acute Rehab Services [id number]   Viann Shove 01/10/2023, 4:33 PM

## 2023-01-10 NOTE — Progress Notes (Signed)
[place] for warfarin Indication: AFib / hx PE  Allergies  Allergen Reactions   Diclofenac Nausea Only   Atorvastatin Other (See Comments)    Muscle pain   Tape Other (See Comments)    Peels skin off (bandaids, too) unknown   Albuterol Other (See Comments)    Afib   Lasix [Furosemide]     Dizzy nausea   Mushroom Extract Complex Nausea And Vomiting   Tramadol Other (See Comments) and Nausea And Vomiting   Buprenorphine Hcl Nausea And Vomiting   Codeine Nausea And Vomiting   Fish Oil Nausea And Vomiting   Morphine And Related Nausea And Vomiting    Patient Measurements: Height: '5\' 8"'$  (172.7 cm) Weight: 103.4 kg (227 lb 15.3 oz) IBW/kg (Calculated) : 63.9  Vital Signs: Temp: 98.1 F (36.7 C) (03/05 0406) Temp Source: Oral (03/05 0406) BP: 142/81 (03/05 0406) Pulse Rate: 62 (03/05 0406)  Labs: Recent Labs    01/09/23 0040 01/09/23 0250 01/09/23 0420 01/09/23 0643 01/10/23 0104  HGB 14.5  --   --  14.1 13.5  HCT 43.4  --   --  41.7 41.0  PLT 112*  --   --  132* 114*  LABPROT  --   --  19.0*  --  24.3*  INR  --   --  1.6*  --  2.2*  CREATININE 0.78  --   --  0.85  --   TROPONINIHS 18* 28*  --  65*  --      Estimated Creatinine Clearance: 70.8 mL/min (by C-G formula based on SCr of 0.85 mg/dL).   Medical History: Past Medical History:  Diagnosis Date   Arthritis    Atrial fibrillation (HCC)    Benign essential tremor    Breast cancer ([place])    Cataract    Bilateral 2018   Chronic renal insufficiency    Common migraine 0000000   Complication of anesthesia    Depression with anxiety    Drowsiness    Excessive daytime   Dyslipidemia    Fibromyalgia    History of partial seizures    Hypertension    Memory disturbance    Muscle tension headache    Obesity    Obstructive sleep apnea    PONV (postoperative nausea and vomiting)    S/P epidural steroid injection    Sacral contusion 07/2018   Seizures (HCC)     Partial   Sleep apnea    does not wear CPAP   Sprain of neck 06/26/2013   Tremors of nervous system    Vitamin B12 deficiency     Assessment: 77yo female c/o N/V/D and HA, admitted for early diverticulitis and ?CAP, to continue warfarin for PAF and h/o PE. Home warfarin dose is '5mg'$  TTSun, 2.'5mg'$  AODs.  INR therapeutic at 2.2, CBC stable this am. Will give reduced dose tonight given rapid INR jump and ongoing ABX.  Goal of Therapy:  INR 2-3   Plan:  Warfarin '4mg'$  PO x1 tonight Daily protime  Arrie Senate, PharmD, BCPS, [facility] Clinical Pharmacist [phone] Please check AMION for all [place] numbers 01/10/2023

## 2023-01-10 NOTE — TOC Progression Note (Signed)
Transition of Care [facility]) - Progression Note    Patient Details  Name: Debbie [name] MRN: [id number] Date of Birth: 1946-02-01  [facility] of [facility]) CM/SW Contact  Zenon Mayo, RN Phone Number: 01/10/2023, 4:40 PM  Clinical Narrative:    NCM offered choice to patient for HHPT, HHOT, she chose [facility], NCM made referral to Amy with [facility], she is able to take referral.  Soc will begin 24 to 48hrs post dc.          Expected Discharge Plan and Services                                               Social Determinants of Health (SDOH) Interventions SDOH Screenings   Food Insecurity: No Food Insecurity (01/09/2023)  Housing: Low Risk  (01/09/2023)  Transportation Needs: No Transportation Needs (01/09/2023)  Utilities: Not At Risk (01/09/2023)  Depression (PHQ2-9): Medium Risk (06/17/2020)  Financial Resource Strain: Low Risk  (06/17/2020)  Physical Activity: Inactive (06/17/2020)  Social Connections: Moderately Isolated (06/17/2020)  Stress: Stress Concern Present (06/17/2020)  Tobacco Use: Low Risk  (01/09/2023)    Readmission Risk Interventions     No data to display

## 2023-01-11 LAB — CBC
HCT: 38.3 % (ref 36.0–46.0)
Hemoglobin: 12.4 g/dL (ref 12.0–15.0)
MCH: 31.2 pg (ref 26.0–34.0)
MCHC: 32.4 g/dL (ref 30.0–36.0)
MCV: 96.2 fL (ref 80.0–100.0)
Platelets: 88 10*3/uL — ABNORMAL LOW (ref 150–400)
RBC: 3.98 MIL/uL (ref 3.87–5.11)
RDW: 14.3 % (ref 11.5–15.5)
WBC: 5.8 10*3/uL (ref 4.0–10.5)
nRBC: 0 % (ref 0.0–0.2)

## 2023-01-11 LAB — PROTIME-INR
INR: 2.8 — ABNORMAL HIGH (ref 0.8–1.2)
Prothrombin Time: 28.8 seconds — ABNORMAL HIGH (ref 11.4–15.2)

## 2023-01-11 MED ORDER — SODIUM CHLORIDE 0.9 % IV SOLN
INTRAVENOUS | Status: DC | PRN
  Administered 2023-01-11: 250 mL via INTRAVENOUS

## 2023-01-11 MED ORDER — COLCHICINE 0.6 MG PO TABS
0.6000 mg | ORAL_TABLET | Freq: Every day | ORAL | Status: DC
  Administered 2023-01-11 – 2023-01-12 (×2): 0.6 mg via ORAL
  Filled 2023-01-11 (×2): qty 1

## 2023-01-11 MED ORDER — WARFARIN SODIUM 2 MG PO TABS
2.0000 mg | ORAL_TABLET | Freq: Once | ORAL | Status: AC
  Administered 2023-01-11: 2 mg via ORAL
  Filled 2023-01-11: qty 1

## 2023-01-11 MED ORDER — MECLIZINE HCL 25 MG PO TABS
12.5000 mg | ORAL_TABLET | Freq: Two times a day (BID) | ORAL | Status: DC | PRN
  Administered 2023-01-11: 12.5 mg via ORAL
  Filled 2023-01-11: qty 1

## 2023-01-11 NOTE — Progress Notes (Signed)
Occupational Therapy Treatment Patient Details Name: Debbie [name] MRN: [id number] DOB: [birth year] Today's Date: 01/11/2023   History of present illness Debbie [name] is a 77 yo female who presented with nausea, vomiting, diarrhea, abdominal pain, headaches, cough, and shortness of breath. Chest x-ray showing cardiomegaly with vascular congestion and bibasilar opacities which could reflect atelectasis or infiltrates.  Possible small left effusion.  CT head negative for acute finding.  CT renal stone study showing diverticulosis with findings of very mild sigmoid diverticulitis. PMHx: persistent atrial fibrillation status post ablation now paroxysmal on warfarin, PE in 2017, hypertension, hyperlipidemia, obesity (BMI 34.36), sleep apnea not on CPAP, fibromyalgia, chronic migraine headaches, essential tremor, history of brain tumor in 2005, seizure disorder, history of breast cancer, depression and anxiety   OT comments  Pt is making fair progress towards their acute OT goals. Upon arrival pt's SpO2 was 87% with alarms beeping, O2 recovered to >90% on RA with activity and PLB. Pt required maximal encouragement and education to participate due to complaints of being "wiped out," and "too weak." Reviewed gaze stabilization technique to help with dizziness and cues provided during mobility. Min A needed upon standing due to LOB. Pt with increased self-distracting thoughts throughout and perseverating on her ability to assist her husband at d/c. OT to continue to follow acutely. Discharge recommendation to home with increased assist from family and [place].    Recommendations for follow up therapy are one component of a multi-disciplinary discharge planning process, led by the attending physician.  Recommendations may be updated based on patient status, additional functional criteria and insurance authorization.    Follow Up Recommendations  Home health OT     Assistance Recommended at Discharge  Frequent or constant Supervision/Assistance  Patient can return home with the following  A little help with walking and/or transfers;A little help with bathing/dressing/bathroom;Assistance with cooking/housework;Assist for transportation;Help with stairs or ramp for entrance   Equipment Recommendations  None recommended by OT       Precautions / Restrictions Precautions Precautions: Fall Precaution Comments: vestib/dizzy Restrictions Weight Bearing Restrictions: No       Mobility Bed Mobility Overal bed mobility: Needs Assistance Bed Mobility: Sidelying to Sit, Rolling Rolling: Supervision Sidelying to sit: Supervision Supine to sit: Supervision     General bed mobility comments: cues for gaze stabilization throughout    Transfers Overall transfer level: Needs assistance Equipment used: Rolling walker (2 wheels) Transfers: Sit to/from Stand Sit to Stand: Min guard           General transfer comment: LOB upon standing due to dizziness. cues for gaze stabilization     Balance Overall balance assessment: Needs assistance Sitting-balance support: Feet supported, No upper extremity supported Sitting balance-Leahy Scale: Good     Standing balance support: Bilateral upper extremity supported, During functional activity Standing balance-Leahy Scale: Poor                             ADL either performed or assessed with clinical judgement   ADL Overall ADL's : Needs assistance/impaired     Grooming: Set up;Sitting                   Toilet Transfer: Minimal assistance;Ambulation;Rolling walker (2 wheels) Toilet Transfer Details (indicate cue type and reason): gaze stabilization         Functional mobility during ADLs: Minimal assistance;Rolling walker (2 wheels) General ADL Comments: limited by dizziness ,fatigue, activity tolerance. pt  perseverating with self distracting thoughts throughout    Extremity/Trunk Assessment Upper Extremity  Assessment Upper Extremity Assessment: Generalized weakness   Lower Extremity Assessment Lower Extremity Assessment: Generalized weakness        Vision   Vision Assessment?: No apparent visual deficits Additional Comments: nystagmus   Perception Perception Perception: Not tested   Praxis Praxis Praxis: Not tested    Cognition Arousal/Alertness: Awake/alert Behavior During Therapy: Flat affect Overall Cognitive Status: No family/caregiver present to determine baseline cognitive functioning                                 General Comments: Limited insight and problem solcing, perseverating on her husbands ability to assist her as well as her need to assist her husband              General Comments SpO2 87% on RA upon arrival. Improved >90% with activity    Pertinent Vitals/ Pain       Pain Assessment Pain Assessment: Faces Faces Pain Scale: Hurts even more Pain Location: generalized Pain Descriptors / Indicators: Aching, Sore Pain Intervention(s): Limited activity within patient's tolerance, Monitored during session   Frequency  Min 2X/week        Progress Toward Goals  OT Goals(current goals can now be found in the care plan section)  Progress towards OT goals: Progressing toward goals  Acute Rehab OT Goals Patient Stated Goal: to get better OT Goal Formulation: With patient Time For Goal Achievement: 01/23/23 Potential to Achieve Goals: Good ADL Goals Pt Will Perform Grooming: with supervision;standing Pt Will Perform Lower Body Dressing: with supervision;sit to/from stand Pt Will Transfer to Toilet: with supervision;ambulating Pt Will Perform Toileting - Clothing Manipulation and hygiene: with modified independence Additional ADL Goal #1: Pt will indep recall at least 3 energy conservation strategies to apply at discharge  Plan Discharge plan remains appropriate       AM-PAC OT "6 Clicks" Daily Activity     Outcome Measure   Help  from another person eating meals?: None Help from another person taking care of personal grooming?: A Little Help from another person toileting, which includes using toliet, bedpan, or urinal?: A Little Help from another person bathing (including washing, rinsing, drying)?: A Little Help from another person to put on and taking off regular upper body clothing?: A Little Help from another person to put on and taking off regular lower body clothing?: A Little 6 Click Score: 19    End of Session Equipment Utilized During Treatment: Rolling walker (2 wheels)  OT Visit Diagnosis: Unsteadiness on feet (R26.81);Other abnormalities of gait and mobility (R26.89);Muscle weakness (generalized) (M62.81)   Activity Tolerance Patient limited by fatigue   Patient Left with call bell/phone within reach;in bed   Nurse Communication Mobility status        Time: [id number] OT Time Calculation (min): 23 min  Charges: OT General Charges $OT Visit: 1 Visit OT Treatments $Therapeutic Activity: 23-37 mins  Shade Flood, OTR/L [place] [facility] [place] Communication Preferred   Elliot Cousin 01/11/2023, 2:56 PM

## 2023-01-11 NOTE — Progress Notes (Signed)
[facility] for warfarin Indication: AFib / hx PE  Allergies  Allergen Reactions   Diclofenac Nausea Only   Atorvastatin Other (See Comments)    Muscle pain   Tape Other (See Comments)    Peels skin off (bandaids, too) unknown   Albuterol Other (See Comments)    Afib   Lasix [Furosemide]     Dizzy nausea   Mushroom Extract Complex Nausea And Vomiting   Tramadol Other (See Comments) and Nausea And Vomiting   Buprenorphine Hcl Nausea And Vomiting   Codeine Nausea And Vomiting   Fish Oil Nausea And Vomiting   Morphine And Related Nausea And Vomiting    Patient Measurements: Height: '5\' 8"'$  (172.7 cm) Weight: 103 kg (227 lb 1.2 oz) IBW/kg (Calculated) : 63.9  Vital Signs: Temp: 98.1 F (36.7 C) (03/06 0900) Temp Source: Oral (03/06 0900) BP: 130/58 (03/06 0900) Pulse Rate: 60 (03/06 0900)  Labs: Recent Labs    01/09/23 0040 01/09/23 0250 01/09/23 0420 01/09/23 0643 01/10/23 0104 01/11/23 0109  HGB 14.5  --   --  14.1 13.5 12.4  HCT 43.4  --   --  41.7 41.0 38.3  PLT 112*  --   --  132* 114* 88*  LABPROT  --   --  19.0*  --  24.3* 28.8*  INR  --   --  1.6*  --  2.2* 2.8*  CREATININE 0.78  --   --  0.85  --   --   TROPONINIHS 18* 28*  --  65*  --   --      Estimated Creatinine Clearance: 70.7 mL/min (by C-G formula based on SCr of 0.85 mg/dL).   Medical History: Past Medical History:  Diagnosis Date   Arthritis    Atrial fibrillation (HCC)    Benign essential tremor    Breast cancer ([place])    Cataract    Bilateral 2018   Chronic renal insufficiency    Common migraine 0000000   Complication of anesthesia    Depression with anxiety    Drowsiness    Excessive daytime   Dyslipidemia    Fibromyalgia    History of partial seizures    Hypertension    Memory disturbance    Muscle tension headache    Obesity    Obstructive sleep apnea    PONV (postoperative nausea and vomiting)    S/P epidural steroid injection     Sacral contusion 07/2018   Seizures (HCC)    Partial   Sleep apnea    does not wear CPAP   Sprain of neck 06/26/2013   Tremors of nervous system    Vitamin B12 deficiency     Assessment: 77yo female c/o N/V/D and HA, admitted for early diverticulitis and ?CAP, to continue warfarin for PAF and h/o PE. Home warfarin dose is '5mg'$  TTSun, 2.'5mg'$  AODs.   Last dose taken PTA 01/08/23 @ 1800 INR was 1.6, subtherapeutic on admission 01/09/23.    INR therapeutic at 2.8,  Hgb 13.5>12.4,  pltc trending down 114>88k.  No bleeding reported. Possible drug-drug interaction with azithromycin. Azithromycin now discontinued 3/6 AM , after 3 days of therapy, although effect will linger.   Anticipate this ABX is increasing the hypoprothrombinemic effect of warfarin.  Will lower dose today.  Goal of Therapy:  INR 2-3   Plan:  Warfarin 2 mg PO x1 tonight Daily PT/INR  Thank you for allowing pharmacy to be part of this patients care team.  Rod Holler  Carlis Abbott, [place] Clinical Pharmacist [phone] Please check AMION for all [facility] Pharmacy numbers 01/11/2023

## 2023-01-11 NOTE — Progress Notes (Signed)
[facility] for warfarin Indication: AFib / hx PE  Allergies  Allergen Reactions   Diclofenac Nausea Only   Atorvastatin Other (See Comments)    Muscle pain   Tape Other (See Comments)    Peels skin off (bandaids, too) unknown   Albuterol Other (See Comments)    Afib   Lasix [Furosemide]     Dizzy nausea   Mushroom Extract Complex Nausea And Vomiting   Tramadol Other (See Comments) and Nausea And Vomiting   Buprenorphine Hcl Nausea And Vomiting   Codeine Nausea And Vomiting   Fish Oil Nausea And Vomiting   Morphine And Related Nausea And Vomiting    Patient Measurements: Height: '5\' 8"'$  (172.7 cm) Weight: 103 kg (227 lb 1.2 oz) IBW/kg (Calculated) : 63.9  Vital Signs: Temp: 98.1 F (36.7 C) (03/06 0900) Temp Source: Oral (03/06 0900) BP: 130/58 (03/06 0900) Pulse Rate: 60 (03/06 0900)  Labs: Recent Labs    01/09/23 0040 01/09/23 0250 01/09/23 0420 01/09/23 0643 01/10/23 0104 01/11/23 0109  HGB 14.5  --   --  14.1 13.5 12.4  HCT 43.4  --   --  41.7 41.0 38.3  PLT 112*  --   --  132* 114* 88*  LABPROT  --   --  19.0*  --  24.3* 28.8*  INR  --   --  1.6*  --  2.2* 2.8*  CREATININE 0.78  --   --  0.85  --   --   TROPONINIHS 18* 28*  --  65*  --   --      Estimated Creatinine Clearance: 70.7 mL/min (by C-G formula based on SCr of 0.85 mg/dL).   Medical History: Past Medical History:  Diagnosis Date   Arthritis    Atrial fibrillation (HCC)    Benign essential tremor    Breast cancer ([place])    Cataract    Bilateral 2018   Chronic renal insufficiency    Common migraine 0000000   Complication of anesthesia    Depression with anxiety    Drowsiness    Excessive daytime   Dyslipidemia    Fibromyalgia    History of partial seizures    Hypertension    Memory disturbance    Muscle tension headache    Obesity    Obstructive sleep apnea    PONV (postoperative nausea and vomiting)    S/P epidural steroid injection     Sacral contusion 07/2018   Seizures (HCC)    Partial   Sleep apnea    does not wear CPAP   Sprain of neck 06/26/2013   Tremors of nervous system    Vitamin B12 deficiency     Assessment: 77yo female c/o N/V/D and HA, admitted for early diverticulitis and ?CAP, to continue warfarin for PAF and h/o PE. Home warfarin dose is '5mg'$  TTSun, 2.'5mg'$  AODs.   Last dose taken PTA 01/08/23 @ 1800 INR was 1.6, subtherapeutic on admission 01/09/23.    INR therapeutic at 2.8,  Hgb 13.5>12.4,  pltc trending down 114>88k.  Possible drug-drug interaction with azithromycin. Azithromycin now discontinued 3/6 AM , after 3 days of therapy, although effect will linger.   Anticipate this ABX is increasing the hypoprothrombinemic effect of warfarin.  Will lower dose today.  Goal of Therapy:  INR 2-3   Plan:  Warfarin 2 mg PO x1 tonight Daily PT/INR  Thank you for allowing pharmacy to be part of this patients care team.  Nicole Cella, RPh Clinical  Pharmacist [phone] Please check AMION for all [facility] Pharmacy numbers 01/11/2023

## 2023-01-11 NOTE — Progress Notes (Addendum)
[facility]                                                                               Debbie [name], is a 77 y.o. female, DOB - 05-01-1946, [id number] Admit date - 01/09/2023    Outpatient Primary MD for the patient is System, Provider Not In  LOS - 2  days    Brief summary   Debbie [name] is a 77 y.o. female with a history of paroxysmal atrial fibrillation, PE, hypertension, hyperlipidemia, obesity, sleep apnea, fibromyalgia, chronic migraine headaches, primary hypertension, brain tumor, seizure disorder, breast cancer, depression, anxiety. Patient presented secondary to nausea, vomiting, diarrhea, abdominal pain, headaches, cough and dyspnea. Initial workup concerning for diverticulitis with questionable presence of pneumonia. Empiric antibiotics started. Workup negative for C. Difficile and other GI pathogens. Clinically improving. SHE REPORTS pain has n't resolved but improving.    Assessment & Plan    Assessment and Plan:   Mild acute sigmoid diverticulitis CT renal study shows mild sigmoid diverticulitis with diverticulosis. She was started on IV Zosyn C. difficile PCR and GI pathogen PCR is negative. Patient denies any nausea vomiting and has soft bowel movements. Advance diet as tolerated.   Abnormal chest x-ray concern for possible pneumonia Patient is on room air without any hypoxia she denies any shortness of breath has occasional nonproductive cough. Patient is already on IV Zosyn. Incentive spirometer recommended   Chronic migraine headaches As needed pain medication    Paroxysmal atrial fibrillation Continue with amiodarone for rhythm control and on Coumadin for anticoagulation. INR is therapeutic    Hypertension Blood pressure parameters are optimal     Mild thrombocytopenia Continue to monitor    History of seizure disorder Continue with Lamotrigine.    Atypical chest pain.  Resolved.   Left hand / first  metatarsal pain and tenderness: - possibly gout flare up.  - started the patient on colchicine.    Dizziness , intermittent PT vestibular eval ordered and recommendations given.      Estimated body mass index is 34.53 kg/m as calculated from the following:   Height as of this encounter: '5\' 8"'$  (1.727 m).   Weight as of this encounter: 103 kg.  Code Status: DNR. DVT Prophylaxis:   warfarin (COUMADIN) tablet 2 mg   Level of Care: Level of care: Telemetry Cardiac Family Communication: none at bedside.   Disposition Plan:     Remains inpatient appropriate:  IV zosyn.   Procedures:  None.   Consultants:   None.   Antimicrobials:   Anti-infectives (From admission, onward)    Start     Dose/Rate Route Frequency Ordered Stop   01/10/23 0600  azithromycin (ZITHROMAX) 500 mg in sodium chloride 0.9 % 250 mL IVPB        500 mg 250 mL/hr over 60 Minutes Intravenous Every 24 hours 01/09/23 0610 01/11/23 0626   01/10/23 0400  cefTRIAXone (ROCEPHIN) 1 g in sodium chloride 0.9 % 100 mL IVPB  Status:  Discontinued        1 g 200 mL/hr over 30 Minutes Intravenous Every 24 hours 01/09/23 0610 01/09/23 0636   01/09/23 1600  metroNIDAZOLE (FLAGYL) IVPB  500 mg  Status:  Discontinued        500 mg 100 mL/hr over 60 Minutes Intravenous Every 12 hours 01/09/23 0610 01/09/23 0636   01/09/23 0800  piperacillin-tazobactam (ZOSYN) IVPB 3.375 g        3.375 g 12.5 mL/hr over 240 Minutes Intravenous Every 8 hours 01/09/23 0639     01/09/23 0615  metroNIDAZOLE (FLAGYL) IVPB 500 mg  Status:  Discontinued        500 mg 100 mL/hr over 60 Minutes Intravenous  Once 01/09/23 0614 01/09/23 0636   01/09/23 0345  cefTRIAXone (ROCEPHIN) 1 g in sodium chloride 0.9 % 100 mL IVPB        1 g 200 mL/hr over 30 Minutes Intravenous  Once 01/09/23 0332 01/09/23 0439   01/09/23 0345  azithromycin (ZITHROMAX) 500 mg in sodium chloride 0.9 % 250 mL IVPB        500 mg 250 mL/hr over 60 Minutes Intravenous  Once  01/09/23 [id number] 01/09/23 0602        Medications  Scheduled Meds:  amiodarone  100 mg Oral Daily   colchicine  0.6 mg Oral Daily   lamoTRIgine  25 mg Oral BID   warfarin  2 mg Oral ONCE-1600   Warfarin - Pharmacist Dosing Inpatient   Does not apply q1600   Continuous Infusions:  sodium chloride Stopped (01/11/23 1000)   piperacillin-tazobactam (ZOSYN)  IV 12.5 mL/hr at 01/11/23 1007   promethazine (PHENERGAN) injection (IM or IVPB)     PRN Meds:.sodium chloride, acetaminophen **OR** acetaminophen, hydrALAZINE, HYDROcodone-acetaminophen, meclizine, ondansetron (ZOFRAN) IV, promethazine (PHENERGAN) injection (IM or IVPB)    Subjective:   Debbie [name] was seen and examined today.  Feeling dizzy. Abd pain has not resolved.   Objective:   Vitals:   01/11/23 0001 01/11/23 0411 01/11/23 0737 01/11/23 0900  BP: (!) 107/42 (!) 127/43 (!) 117/45 (!) 130/58  Pulse: (!) 58 (!) 59 (!) 53 60  Resp: '17 18  18  '$ Temp: 98.1 F (36.7 C) 98.3 F (36.8 C)  98.1 F (36.7 C)  TempSrc: Oral Oral  Oral  SpO2: 93% 95% 94% 93%  Weight:  103 kg    Height:        Intake/Output Summary (Last 24 hours) at 01/11/2023 1253 Last data filed at 01/11/2023 1007 Gross per 24 hour  Intake 1180.01 ml  Output 401 ml  Net 779.01 ml   Filed Weights   01/09/23 0750 01/10/23 0031 01/11/23 0411  Weight: 102.8 kg 103.4 kg 103 kg     Exam General exam: Appears calm and comfortable  Respiratory system: Clear to auscultation. Respiratory effort normal. Cardiovascular system: S1 & S2 heard, RRR. No JVD, murmurs,  Gastrointestinal system: Abdomen is soft, tender, bs+ Central nervous system: Alert and oriented. No focal neurological deficits. Extremities: Symmetric 5 x 5 power. Skin: No rashes, lesions or ulcers Psychiatry: Judgement and insight appear normal. Mood & affect appropriate.     Data Reviewed:  I have personally reviewed following labs and imaging studies   CBC Lab Results  Component  Value Date   WBC 5.8 01/11/2023   RBC 3.98 01/11/2023   HGB 12.4 01/11/2023   HCT 38.3 01/11/2023   MCV 96.2 01/11/2023   MCH 31.2 01/11/2023   PLT 88 (L) 01/11/2023   MCHC 32.4 01/11/2023   RDW 14.3 01/11/2023   LYMPHSABS 0.7 01/09/2023   MONOABS 0.2 01/09/2023   EOSABS 0.0 01/09/2023   BASOSABS 0.0 01/09/2023     Last  metabolic panel Lab Results  Component Value Date   NA 136 01/09/2023   K 3.6 01/09/2023   CL 99 01/09/2023   CO2 22 01/09/2023   BUN 9 01/09/2023   CREATININE 0.85 01/09/2023   GLUCOSE 131 (H) 01/09/2023   GFRNONAA >60 01/09/2023   GFRAA 82 07/29/2020   CALCIUM 8.9 01/09/2023   PROT 7.5 01/09/2023   ALBUMIN 3.9 01/09/2023   LABGLOB 2.6 04/19/2018   AGRATIO 1.6 04/19/2018   BILITOT 1.2 01/09/2023   ALKPHOS 56 01/09/2023   AST 26 01/09/2023   ALT 17 01/09/2023   ANIONGAP 15 01/09/2023    CBG (last 3)  No results for input(s): "GLUCAP" in the last 72 hours.    Coagulation Profile: Recent Labs  Lab 01/09/23 0420 01/10/23 0104 01/11/23 0109  INR 1.6* 2.2* 2.8*     Radiology Studies: No results found.     Hosie Poisson M.D. [facility] 01/11/2023, 12:53 PM  Available via Epic secure chat 7am-7pm After 7 pm, please refer to night coverage provider listed on amion.

## 2023-01-11 NOTE — Progress Notes (Signed)
Physical Therapy Treatment Patient Details Name: Debbie [name] MRN: [id number] DOB: 1946/06/26 Today's Date: 01/11/2023   History of Present Illness Debbie [name] is a 77 yo female who presented with nausea, vomiting, diarrhea, abdominal pain, headaches, cough, and shortness of breath. Chest x-ray showing cardiomegaly with vascular congestion and bibasilar opacities which could reflect atelectasis or infiltrates.  Possible small left effusion.  CT head negative for acute finding.  CT renal stone study showing diverticulosis with findings of very mild sigmoid diverticulitis. PMHx: persistent atrial fibrillation status post ablation now paroxysmal on warfarin, PE in 2017, hypertension, hyperlipidemia, obesity (BMI 34.36), sleep apnea not on CPAP, fibromyalgia, chronic migraine headaches, essential tremor, history of brain tumor in 2005, seizure disorder, history of breast cancer, depression and anxiety    PT Comments    Pt admitted with above diagnosis. Pt was unable to tolerate canalith repositioning maneuver due to neck and back pain therefore could not treat left posterior canal BPPV.  Suggested to MD to try medications for vertigo and HHPT f/u for habituation/compensation and gaze stability exercises.  Will continue to follow acutely.  Pt currently with functional limitations due to balance and endurance deficits. Pt will benefit from skilled PT to increase their independence and safety with mobility to allow discharge to the venue listed below.      Recommendations for follow up therapy are one component of a multi-disciplinary discharge planning process, led by the attending physician.  Recommendations may be updated based on patient status, additional functional criteria and insurance authorization.  Follow Up Recommendations  Home health PT (Vestibular rehab)     Assistance Recommended at Discharge Intermittent Supervision/Assistance  Patient can return home with the following A little  help with walking and/or transfers;A little help with bathing/dressing/bathroom;Assistance with cooking/housework;Assist for transportation;Help with stairs or ramp for entrance   Equipment Recommendations  None recommended by PT (Has needed DME)    Recommendations for Other Services       Precautions / Restrictions Precautions Precautions: Fall Restrictions Weight Bearing Restrictions: No     Mobility  Bed Mobility Overal bed mobility: Needs Assistance Bed Mobility: Supine to Sit     Supine to sit: Supervision          Transfers Overall transfer level: Needs assistance Equipment used: Rolling walker (2 wheels) Transfers: Sit to/from Stand Sit to Stand: Min guard   Step pivot transfers: Min guard       General transfer comment: Pt was on 3n1 on arrival.  She cleaned herself and then was able to safely step a few steps back to bed.    Ambulation/Gait                   Stairs             Wheelchair Mobility    Modified Rankin (Stroke Patients Only)       Balance Overall balance assessment: Mild deficits observed, not formally tested Sitting-balance support: Feet supported, No upper extremity supported Sitting balance-Leahy Scale: Good     Standing balance support: Bilateral upper extremity supported, During functional activity Standing balance-Leahy Scale: Poor Standing balance comment: Pt requires bil UE support for balance                            Cognition Arousal/Alertness: Awake/alert Behavior During Therapy: WFL for tasks assessed/performed Overall Cognitive Status: Within Functional Limits for tasks assessed  Exercises Other Exercises Other Exercises: x 1 exercises    General Comments General comments (skin integrity, edema, etc.): VSS on RA. Pt was positive for left posterior canal BPPV however pt could not tolerate canalith repositioning maneuver as she  has neck, back and right side pain.  Could not modify bed to treat either.  Messaged MD that pt will most likely need to take Meclizine or Antivert and will try some gaze stability exercises and HHPT.      Pertinent Vitals/Pain Pain Assessment Pain Assessment: Faces Faces Pain Scale: Hurts whole lot Pain Location: R side, neck and back as well as head Pain Descriptors / Indicators: Aching, Sore Pain Intervention(s): Limited activity within patient's tolerance, Monitored during session, Repositioned, Patient requesting pain meds-RN notified    Home Living                          Prior Function            PT Goals (current goals can now be found in the care plan section) Acute Rehab PT Goals Patient Stated Goal: To get stronger Progress towards PT goals: Progressing toward goals    Frequency    Min 3X/week      PT Plan Current plan remains appropriate    Co-evaluation              AM-PAC PT "6 Clicks" Mobility   Outcome Measure  Help needed turning from your back to your side while in a flat bed without using bedrails?: None Help needed moving from lying on your back to sitting on the side of a flat bed without using bedrails?: None Help needed moving to and from a bed to a chair (including a wheelchair)?: A Little Help needed standing up from a chair using your arms (e.g., wheelchair or bedside chair)?: A Little Help needed to walk in hospital room?: A Little Help needed climbing 3-5 steps with a railing? : A Lot 6 Click Score: 19    End of Session Equipment Utilized During Treatment: Gait belt Activity Tolerance: Patient tolerated treatment well Patient left: with call bell/phone within reach;in bed;with bed alarm set Nurse Communication: Mobility status PT Visit Diagnosis: Other abnormalities of gait and mobility (R26.89);Unsteadiness on feet (R26.81)     Time: 1100-1135 PT Time Calculation (min) (ACUTE ONLY): 35 min  Charges:  $Therapeutic  Activity: 8-22 mins                     [facility] M,PT Acute Rehab Services [phone]    Alvira Philips 01/11/2023, 12:46 PM

## 2023-01-12 LAB — PROTIME-INR
INR: 2.8 — ABNORMAL HIGH (ref 0.8–1.2)
Prothrombin Time: 29.5 seconds — ABNORMAL HIGH (ref 11.4–15.2)

## 2023-01-12 MED ORDER — COLCHICINE 0.6 MG PO TABS: 0.6000 mg | ORAL_TABLET | Freq: Every day | ORAL | 0 refills | Status: DC

## 2023-01-12 MED ORDER — WARFARIN SODIUM 2 MG PO TABS: 4.0000 mg | ORAL_TABLET | Freq: Once | ORAL | Status: DC

## 2023-01-12 MED ORDER — AMOXICILLIN-POT CLAVULANATE 875-125 MG PO TABS: 1.0000 | ORAL_TABLET | Freq: Two times a day (BID) | ORAL | 0 refills | Status: AC

## 2023-01-12 MED ORDER — MECLIZINE HCL 12.5 MG PO TABS: 12.5000 mg | ORAL_TABLET | Freq: Two times a day (BID) | ORAL | 0 refills | Status: AC | PRN

## 2023-01-12 NOTE — Progress Notes (Signed)
[place] for warfarin Indication: AFib / hx PE  Allergies  Allergen Reactions   Diclofenac Nausea Only   Atorvastatin Other (See Comments)    Muscle pain   Tape Other (See Comments)    Peels skin off (bandaids, too) unknown   Albuterol Other (See Comments)    Afib   Lasix [Furosemide]     Dizzy nausea   Mushroom Extract Complex Nausea And Vomiting   Tramadol Other (See Comments) and Nausea And Vomiting   Buprenorphine Hcl Nausea And Vomiting   Codeine Nausea And Vomiting   Fish Oil Nausea And Vomiting   Morphine And Related Nausea And Vomiting    Patient Measurements: Height: '5\' 8"'$  (172.7 cm) Weight: 101.7 kg (224 lb 1.6 oz) IBW/kg (Calculated) : 63.9  Vital Signs: Temp: 98 F (36.7 C) (03/07 0720) Temp Source: Oral (03/07 0720) BP: 131/53 (03/07 0720) Pulse Rate: 54 (03/07 0720)  Labs: Recent Labs    01/10/23 0104 01/11/23 0109 01/12/23 0049  HGB 13.5 12.4  --   HCT 41.0 38.3  --   PLT 114* 88*  --   LABPROT 24.3* 28.8* 29.5*  INR 2.2* 2.8* 2.8*     Estimated Creatinine Clearance: 70.2 mL/min (by C-G formula based on SCr of 0.85 mg/dL).   Medical History: Past Medical History:  Diagnosis Date   Arthritis    Atrial fibrillation (HCC)    Benign essential tremor    Breast cancer ([place])    Cataract    Bilateral 2018   Chronic renal insufficiency    Common migraine 0000000   Complication of anesthesia    Depression with anxiety    Drowsiness    Excessive daytime   Dyslipidemia    Fibromyalgia    History of partial seizures    Hypertension    Memory disturbance    Muscle tension headache    Obesity    Obstructive sleep apnea    PONV (postoperative nausea and vomiting)    S/P epidural steroid injection    Sacral contusion 07/2018   Seizures (HCC)    Partial   Sleep apnea    does not wear CPAP   Sprain of neck 06/26/2013   Tremors of nervous system    Vitamin B12 deficiency     Assessment: 77yo  female c/o N/V/D and HA, admitted for early diverticulitis and ?CAP, to continue warfarin for PAF and h/o PE. Last dose taken PTA 01/08/23 @ 1800. INR was 1.6, subtherapeutic on admission 01/09/23.    Home warfarin dose is '5mg'$  TTSun, 2.'5mg'$  AODs  INR remains therapeutic at 2.8,  No new CBC today. No bleeding reported. PO intake increasing today to 65% this morning. Drug-drug interaction: azithromycin 3/4 > 3/6   Goal of Therapy:  INR 2-3   Plan:  Give warfarin 4 mg po x 1 Monitor daily INR, CBC, clinical course, s/sx of bleed, PO intake/diet, Drug-Drug Interactions   Thank you for allowing [place] to participate in this patients care. Jens Som, PharmD 01/12/2023 10:38 AM  **Pharmacist phone directory can be found on [email or web address] listed under [place]**

## 2023-01-12 NOTE — Plan of Care (Signed)

## 2023-01-12 NOTE — Progress Notes (Signed)
Pharmacy Antibiotic Note  Debbie [name] is a 77 y.o. female admitted on 01/09/2023 with  diverticulitis .  Pharmacy consulted for Zosyn dosing.  Plan: Zosyn 3.375g IV q8h (4 hour infusion). Monitor clinical progress, cultures/sensitivities, renal function, abx plan  Height: '5\' 8"'$  (172.7 cm) Weight: 101.7 kg (224 lb 1.6 oz) IBW/kg (Calculated) : 63.9  Temp (24hrs), Avg:98.1 F (36.7 C), Min:98 F (36.7 C), Max:98.2 F (36.8 C)  Recent Labs  Lab 01/09/23 0040 01/09/23 0643 01/10/23 0104 01/11/23 0109  WBC 8.6 10.8* 7.4 5.8  CREATININE 0.78 0.85  --   --     Estimated Creatinine Clearance: 70.2 mL/min (by C-G formula based on SCr of 0.85 mg/dL).    Allergies  Allergen Reactions   Diclofenac Nausea Only   Atorvastatin Other (See Comments)    Muscle pain   Tape Other (See Comments)    Peels skin off (bandaids, too) unknown   Albuterol Other (See Comments)    Afib   Lasix [Furosemide]     Dizzy nausea   Mushroom Extract Complex Nausea And Vomiting   Tramadol Other (See Comments) and Nausea And Vomiting   Buprenorphine Hcl Nausea And Vomiting   Codeine Nausea And Vomiting   Fish Oil Nausea And Vomiting   Morphine And Related Nausea And Vomiting    Antimicrobials this admission: 3/4 ceftriaxone x 1 3/4 azithromycin >> 3/6 3/4 Zosyn >>    Dose adjustments this admission:  Microbiology results: 3/4 Cdiff: neg 3/4 GI panel: neg   Thank you for allowing [place] to participate in this patients care. Jens Som, PharmD 01/12/2023 10:41 AM  **Pharmacist phone directory can be found on [email or web address] listed under [employer]**

## 2023-01-12 NOTE — TOC Transition Note (Signed)
Transition of Care [facility]) - CM/SW Discharge Note   Patient Details  Name: Debbie [name] MRN: [id number] Date of Birth: Jan 19, 1946  Transition of Care [facility]) CM/SW Contact:  Zenon Mayo, RN Phone Number: 01/12/2023, 9:50 AM   Clinical Narrative:    Patient is for dc home today, NCM notified Amy with [facility].  She has trransport home.    Final next level of care: Home w Home Health Services Barriers to Discharge: No Barriers Identified   Patient Goals and CMS Choice CMS Medicare.gov Compare Post Acute Care list provided to:: Patient Choice offered to / list presented to : Patient  Discharge Placement                         Discharge Plan and Services Additional resources added to the After Visit Summary for                  DME Arranged: N/A DME Agency: NA       HH Arranged: PT, OT HH Agency: [place] Date [facility] Agency Contacted: 01/10/23 Time HH Agency Contacted: 1640 Representative spoke with at [place]: [place] Determinants of Health ([place]) Interventions SDOH Screenings   Food Insecurity: No Food Insecurity (01/09/2023)  Housing: Low Risk  (01/09/2023)  Transportation Needs: No Transportation Needs (01/09/2023)  Utilities: Not At Risk (01/09/2023)  Depression (PHQ2-9): Medium Risk (06/17/2020)  Financial Resource Strain: Low Risk  (06/17/2020)  Physical Activity: Inactive (06/17/2020)  Social Connections: Moderately Isolated (06/17/2020)  Stress: Stress Concern Present (06/17/2020)  Tobacco Use: Low Risk  (01/09/2023)     Readmission Risk Interventions     No data to display

## 2023-01-12 NOTE — Progress Notes (Signed)
Patient given discharge instructions and PIV removed by primary RN. Patient dressed herself.

## 2023-01-12 NOTE — Progress Notes (Signed)
Mobility Specialist - Progress Note   01/12/23 0900  Mobility  Activity Ambulated with assistance in hallway  Level of Assistance Contact guard assist, steadying assist  Assistive Device Front wheel walker  Distance Ambulated (ft) 100 ft  Activity Response Tolerated well  Mobility Referral Yes  $Mobility charge 1 Mobility    Pt received in recliner and agreeable. No complaints during walk. Took seated break EOB then returned to recliner EOS. Left w/ all needs met and call bell in reach.   [place] Specialist Please contact via SecureChat or Rehab office at [phone]

## 2023-01-12 NOTE — Care Management Important Message (Signed)
Important Message  Patient Details  Name: IZELLE MALON MRN: [id number] Date of Birth: 26-Feb-1946   Medicare Important Message Given:  Yes     Shelda Altes 01/12/2023, 8:44 AM

## 2023-01-13 ENCOUNTER — Encounter: Payer: Self-pay | Admitting: Physician Assistant

## 2023-01-13 NOTE — Discharge Summary (Signed)
Physician Discharge Summary   Patient: Debbie [name] MRN: [id number] DOB: [birth year]  Admit date:     01/09/2023  Discharge date: 01/12/2023  Discharge Physician: Hosie Poisson   PCP: System, Provider Not In   Recommendations at discharge:  Please follow up with PCP in one week.  Please follow up with cbc and bmp in one week.   Discharge Diagnoses: Principal Problem:   Acute diverticulitis Active Problems:   Paroxysmal atrial fibrillation (HCC)   Chest pain   Nausea and vomiting   HTN (hypertension)   Chronic headaches   Abdominal pain   Diarrhea   Hospital Course: Debbie [name] is a 77 y.o. female with a history of paroxysmal atrial fibrillation, PE, hypertension, hyperlipidemia, obesity, sleep apnea, fibromyalgia, chronic migraine headaches, primary hypertension, brain tumor, seizure disorder, breast cancer, depression, anxiety. Patient presented secondary to nausea, vomiting, diarrhea, abdominal pain, headaches, cough and dyspnea. Initial workup concerning for diverticulitis with questionable presence of pneumonia. Empiric antibiotics started. Workup negative for C. Difficile and other GI pathogens. Clinically improving.  Assessment and Plan:   Mild acute sigmoid diverticulitis CT renal study shows mild sigmoid diverticulitis with diverticulosis. She was started on IV Zosyn C. difficile PCR and GI pathogen PCR is negative. Patient denies any nausea vomiting and has soft bowel movements. Advance diet as tolerated. Transitioned to oral antibiotics to complete the course.      Abnormal chest x-ray concern for possible pneumonia Patient is on room air without any hypoxia she denies any shortness of breath has occasional nonproductive cough. Patient is already on IV Zosyn., transitioned to oral antibiotics ondischarge.  Incentive spirometer recommended     Chronic migraine headaches As needed pain medication       Paroxysmal atrial fibrillation Continue with  amiodarone for rhythm control and on Coumadin for anticoagulation. INR is therapeutic       Hypertension Blood pressure parameters are optimal         Mild thrombocytopenia Continue to monitor       History of seizure disorder Continue with Lamotrigine.      Atypical chest pain.  Resolved.    Left hand / first metatarsal pain and tenderness: - possibly gout flare up.  - started the patient on colchicine.      Dizziness , intermittent PT vestibular eval ordered and recommendations given.          Estimated body mass index is 34.53 kg/m as calculated from the following:   Height as of this encounter: '5\' 8"'$  (1.727 m).   Weight as of this encounter: 103 kg   Consultants: none.  Procedures performed: none.   Disposition: Home Diet recommendation:  Discharge Diet Orders (From admission, onward)     Start     Ordered   01/12/23 0000  Diet - low sodium heart healthy        01/12/23 0859           Regular diet DISCHARGE MEDICATION: Allergies as of 01/12/2023       Reactions   Diclofenac Nausea Only   Atorvastatin Other (See Comments)   Muscle pain   Tape Other (See Comments)   Peels skin off (bandaids, too) unknown   Albuterol Other (See Comments)   Afib   Lasix [furosemide]    Dizzy nausea   Mushroom Extract Complex Nausea And Vomiting   Tramadol Other (See Comments), Nausea And Vomiting   Buprenorphine Hcl Nausea And Vomiting   Codeine Nausea And Vomiting   Fish Oil  Nausea And Vomiting   Morphine And Related Nausea And Vomiting        Medication List     TAKE these medications    acetaminophen 500 MG tablet Commonly known as: TYLENOL Take 500 mg by mouth in the morning, at noon, and at bedtime.   amiodarone 100 MG tablet Commonly known as: PACERONE Take 1 tablet (100 mg total) by mouth daily. What changed: Another medication with the same name was removed. Continue taking this medication, and follow the directions you see here.    amoxicillin-clavulanate 875-125 MG tablet Commonly known as: AUGMENTIN Take 1 tablet by mouth 2 (two) times daily for 7 days.   calcium carbonate 600 MG Tabs tablet Commonly known as: OS-CAL Take 600 mg by mouth daily with breakfast. With vitamin D 800 units   colchicine 0.6 MG tablet Take 1 tablet (0.6 mg total) by mouth daily for 2 days.   cyanocobalamin 1000 MCG tablet Commonly known as: VITAMIN B12 Take 1,000 mcg by mouth daily.   gabapentin 600 MG tablet Commonly known as: NEURONTIN Take 1,500 mg by mouth 3 (three) times daily.   HYDROcodone-acetaminophen 7.5-325 MG tablet Commonly known as: NORCO Take 1 tablet by mouth 3 (three) times daily as needed for moderate pain.   lactulose 10 GM/15ML solution Commonly known as: CHRONULAC Take 59m by mouth daily as needed for constipation   lamoTRIgine 25 MG tablet Commonly known as: LAMICTAL Take 1 tablet (25 mg total) by mouth 2 (two) times daily.   LEVALBUTEROL HCL IN Inhale into the lungs daily as needed (for shortness of breath).   meclizine 12.5 MG tablet Commonly known as: ANTIVERT Take 1 tablet (12.5 mg total) by mouth 2 (two) times daily as needed for dizziness.   ondansetron 4 MG tablet Commonly known as: ZOFRAN Take 4 mg by mouth every 8 (eight) hours as needed for nausea or vomiting.   polyethylene glycol 17 g packet Commonly known as: MIRALAX / GLYCOLAX Take 4.25 g by mouth daily. Mix 1/4 capful with beverage of choice and drink daily   promethazine 25 MG tablet Commonly known as: PHENERGAN Take 6.25 mg by mouth every 6 (six) hours as needed for nausea or vomiting.   VITAMIN C PO Take 282 mg by mouth daily.   Vitamin D (Ergocalciferol) 1.25 MG (50000 UNIT) Caps capsule Commonly known as: DRISDOL TAKE 1 CAPSULE BY MOUTH EVERY 10 DAYS What changed: See the new instructions.   warfarin 5 MG tablet Commonly known as: COUMADIN Take as directed. If you are unsure how to take this medication, talk to  your nurse or doctor. Original instructions: TAKE 1/2 to 1 TABLET BY MOUTH DAILY AS DIRECTED BY ANTICOAGULATION CLINIC What changed:  how much to take how to take this when to take this additional instructions        [facility] Follow up.   Why: Agency will call you to set up apt times Contact information: [id number] [phone]               Discharge Exam: Filed Weights   01/10/23 0031 01/11/23 0411 01/12/23 0330  Weight: 103.4 kg 103 kg 101.7 kg   General exam: Appears calm and comfortable  Respiratory system: Clear to auscultation. Respiratory effort normal. Cardiovascular system: S1 & S2 heard, RRR. No JVD, murmurs, Gastrointestinal system: Abdomen is nondistended, soft and nontender.  Central nervous system: Alert and  oriented. No focal neurological deficits. Extremities: Symmetric 5 x 5 power. Skin: No rashes, lesions or ulcers Psychiatry:  Mood & affect appropriate.    Condition at discharge: fair  The results of significant diagnostics from this hospitalization (including imaging, microbiology, ancillary and laboratory) are listed below for reference.   Imaging Studies: CT Renal Stone Study  Result Date: 01/09/2023 CLINICAL DATA:  Nausea and vomiting with abdominal pain, initial encounter EXAM: CT ABDOMEN AND PELVIS WITHOUT CONTRAST TECHNIQUE: Multidetector CT imaging of the abdomen and pelvis was performed following the standard protocol without IV contrast. RADIATION DOSE REDUCTION: This exam was performed according to the departmental dose-optimization program which includes automated exposure control, adjustment of the mA and/or kV according to patient size and/or use of iterative reconstruction technique. COMPARISON:  12/23/2015 FINDINGS: Lower chest: Scarring is noted in the bases bilaterally. No focal infiltrate is seen. Bilateral breast implants are noted. Small  subcutaneous nodule is noted along the posterior right chest wall likely related to a sebaceous cyst. Hepatobiliary: No focal liver abnormality is seen. Status post cholecystectomy. No biliary dilatation. Pancreas: Unremarkable. No pancreatic ductal dilatation or surrounding inflammatory changes. Spleen: Normal in size without focal abnormality. Adrenals/Urinary Tract: Adrenal glands are within normal limits. Kidneys show a normal enhancement pattern. 1 cm hypodensity is noted in the left kidney consistent with small cyst. No further follow-up is recommended. No obstructive changes are seen. The bladder is decompressed. Stomach/Bowel: Scattered diverticular changes noted with very minimal pericolonic inflammatory change which may represent some early diverticulitis. The appendix is not well visualized and likely surgically removed. No small bowel or gastric abnormality is noted. Vascular/Lymphatic: Aortic atherosclerosis. No enlarged abdominal or pelvic lymph nodes. Reproductive: Status post hysterectomy. No adnexal masses. Other: No abdominal wall hernia or abnormality. No abdominopelvic ascites. Musculoskeletal: No acute or significant osseous findings. IMPRESSION: Diverticulosis with findings of very mild sigmoid diverticulitis. No perforation or abscess is identified. No renal calculi or obstructive changes are noted. Scarring in the lung bases bilaterally. Electronically Signed   By: Inez Catalina M.D.   On: 01/09/2023 01:18   CT Head Wo Contrast  Result Date: 01/09/2023 CLINICAL DATA:  Headaches and nausea and vomiting, initial encounter EXAM: CT HEAD WITHOUT CONTRAST TECHNIQUE: Contiguous axial images were obtained from the base of the skull through the vertex without intravenous contrast. RADIATION DOSE REDUCTION: This exam was performed according to the departmental dose-optimization program which includes automated exposure control, adjustment of the mA and/or kV according to patient size and/or use of  iterative reconstruction technique. COMPARISON:  07/11/2018 FINDINGS: Brain: No evidence of acute infarction, hemorrhage, hydrocephalus, extra-axial collection or mass lesion/mass effect. Vascular: No hyperdense vessel or unexpected calcification. Skull: Postsurgical changes are noted in the posterior right parietal region, stable from the prior exam. Mild hyperostosis is noted. Sinuses/Orbits: No acute finding. Other: None. IMPRESSION: No acute intracranial abnormality noted. Electronically Signed   By: Inez Catalina M.D.   On: 01/09/2023 01:13   DG Chest Portable 1 View  Result Date: 01/09/2023 CLINICAL DATA:  Shortness of breath EXAM: PORTABLE CHEST 1 VIEW COMPARISON:  03/04/2019 FINDINGS: Cardiomegaly, vascular congestion. Bibasilar airspace opacities. Possible small left effusion. No acute bony abnormality. IMPRESSION: Cardiomegaly with vascular congestion and bibasilar opacities which could reflect atelectasis or infiltrates. Possible small left effusion. Electronically Signed   By: Rolm Baptise M.D.   On: 01/09/2023 00:46    Microbiology: Results for orders placed or performed during the hospital encounter of 01/09/23  Resp panel by RT-PCR (RSV, Flu  A&B, Covid) Anterior Nasal Swab     Status: None   Collection Time: 01/09/23  6:43 AM   Specimen: Anterior Nasal Swab  Result Value Ref Range Status   SARS Coronavirus 2 by RT PCR NEGATIVE NEGATIVE Final   Influenza A by PCR NEGATIVE NEGATIVE Final   Influenza B by PCR NEGATIVE NEGATIVE Final    Comment: (NOTE) The Xpert Xpress SARS-CoV-2/FLU/RSV plus assay is intended as an aid in the diagnosis of influenza from Nasopharyngeal swab specimens and should not be used as a sole basis for treatment. Nasal washings and aspirates are unacceptable for Xpert Xpress SARS-CoV-2/FLU/RSV testing.  Fact Sheet for Patients: EntrepreneurPulse.com.au  Fact Sheet for Healthcare Providers: IncredibleEmployment.be  This  test is not yet approved or cleared by the [place] FDA and has been authorized for detection and/or diagnosis of SARS-CoV-2 by FDA under an Emergency Use Authorization (EUA). This EUA will remain in effect (meaning this test can be used) for the duration of the COVID-19 declaration under Section 564(b)(1) of the Act, 21 U.S.C. section 360bbb-3(b)(1), unless the authorization is terminated or revoked.     Resp Syncytial Virus by PCR NEGATIVE NEGATIVE Final    Comment: (NOTE) Fact Sheet for Patients: EntrepreneurPulse.com.au  Fact Sheet for Healthcare Providers: IncredibleEmployment.be  This test is not yet approved or cleared by the [place] FDA and has been authorized for detection and/or diagnosis of SARS-CoV-2 by FDA under an Emergency Use Authorization (EUA). This EUA will remain in effect (meaning this test can be used) for the duration of the COVID-19 declaration under Section 564(b)(1) of the Act, 21 U.S.C. section 360bbb-3(b)(1), unless the authorization is terminated or revoked.  Performed at [facility], [place] [address] [place], [place] [address]   C Difficile Quick Screen w PCR reflex     Status: None   Collection Time: 01/09/23  6:43 AM   Specimen: Stool  Result Value Ref Range Status   C Diff antigen NEGATIVE NEGATIVE Final   C Diff toxin NEGATIVE NEGATIVE Final   C Diff interpretation No C. difficile detected.  Final    Comment: Performed at [facility], [place] [address], [place],  [address]  Gastrointestinal Panel by PCR , Stool     Status: None   Collection Time: 01/09/23  6:43 AM   Specimen: Stool  Result Value Ref Range Status   Campylobacter species NOT DETECTED NOT DETECTED Final   Plesimonas shigelloides NOT DETECTED NOT DETECTED Final   Salmonella species NOT DETECTED NOT DETECTED Final   Yersinia enterocolitica NOT DETECTED NOT DETECTED Final   Vibrio species NOT DETECTED NOT DETECTED  Final   Vibrio cholerae NOT DETECTED NOT DETECTED Final   Enteroaggregative E coli (EAEC) NOT DETECTED NOT DETECTED Final   Enteropathogenic E coli (EPEC) NOT DETECTED NOT DETECTED Final   Enterotoxigenic E coli (ETEC) NOT DETECTED NOT DETECTED Final   Shiga like toxin producing E coli (STEC) NOT DETECTED NOT DETECTED Final   Shigella/Enteroinvasive E coli (EIEC) NOT DETECTED NOT DETECTED Final   Cryptosporidium NOT DETECTED NOT DETECTED Final   Cyclospora cayetanensis NOT DETECTED NOT DETECTED Final   Entamoeba histolytica NOT DETECTED NOT DETECTED Final   Giardia lamblia NOT DETECTED NOT DETECTED Final   Adenovirus F40/41 NOT DETECTED NOT DETECTED Final   Astrovirus NOT DETECTED NOT DETECTED Final   Norovirus GI/GII NOT DETECTED NOT DETECTED Final   Rotavirus A NOT DETECTED NOT DETECTED Final   Sapovirus (I, II, IV, and V) NOT  DETECTED NOT DETECTED Final    Comment: Performed at [facility], [address] [place], [place] [address]    Labs: CBC: Recent Labs  Lab 01/09/23 0040 01/09/23 0643 01/10/23 0104 01/11/23 0109  WBC 8.6 10.8* 7.4 5.8  NEUTROABS 7.6  --   --   --   HGB 14.5 14.1 13.5 12.4  HCT 43.4 41.7 41.0 38.3  MCV 94.6 93.5 94.7 96.2  PLT 112* 132* 114* 88*   Basic Metabolic Panel: Recent Labs  Lab 01/09/23 0040 01/09/23 0643  NA 136 136  K 3.8 3.6  CL 97* 99  CO2 25 22  GLUCOSE 153* 131*  BUN 11 9  CREATININE 0.78 0.85  CALCIUM 9.4 8.9   Liver Function Tests: Recent Labs  Lab 01/09/23 0040  AST 26  ALT 17  ALKPHOS 56  BILITOT 1.2  PROT 7.5  ALBUMIN 3.9   CBG: No results for input(s): "GLUCAP" in the last 168 hours.  Discharge time spent: 45 minutes.   Signed: Hosie Poisson, MD [facility]

## 2023-01-22 ENCOUNTER — Other Ambulatory Visit: Payer: Self-pay | Admitting: Cardiology

## 2023-01-22 DIAGNOSIS — I480 Paroxysmal atrial fibrillation: Secondary | ICD-10-CM

## 2023-01-23 NOTE — Telephone Encounter (Signed)
Please review for refill. Thank you! 

## 2023-02-09 ENCOUNTER — Ambulatory Visit: Payer: Medicare HMO | Attending: Interventional Cardiology

## 2023-02-09 DIAGNOSIS — I4891 Unspecified atrial fibrillation: Secondary | ICD-10-CM | POA: Diagnosis not present

## 2023-02-09 DIAGNOSIS — Z5181 Encounter for therapeutic drug level monitoring: Secondary | ICD-10-CM

## 2023-02-09 LAB — PROTIME-INR
INR: 6.6 (ref 0.9–1.2)
Prothrombin Time: 60.6 s — ABNORMAL HIGH (ref 9.1–12.0)

## 2023-02-09 NOTE — Patient Instructions (Signed)
Description   Called and instructed pt to HOLD Warfarin today, tomorrow, and Saturday and then resume taking 1/2 tablet daily except for 1 tablet on Sunday, Tuesday, and Thursday.  Recheck INR in 1 week  Be consistent with Boost (2 per week).  [facility] [phone].

## 2023-02-10 NOTE — Progress Notes (Unsigned)
02/10/2023 Debbie [name] [id number] [birth year]  Referring provider: No ref. provider found Primary GI doctor: Dr. Lavon PaganiniNandigam  ASSESSMENT AND PLAN:   There are no diagnoses linked to this encounter.   Patient Care Team: System, Provider Not In as PCP - General Jodelle Redhristopher, Bridgette, MD as PCP - Cardiology (Cardiology) Dyane Dustmanyson, Archie Renford DillsAlexander Jr. (Inactive) as Consulting Physician (Cardiology) Barnabas ListerSadiq, Sameea, MD as Referring Physician (Nephrology) Letta KocherSaullo, Thomas, MD as Consulting Physician (Rehabilitation) Marcello FennelPatel, Ankit Anil, MD as Consulting Physician (Physical Medicine and Rehabilitation) Merryl HackerGrossman, Janelle, NP as Nurse Practitioner (Nurse Practitioner) Verner CholPryor, Madeline G, [facility] (Inactive) as Pharmacist (Pharmacist)  HISTORY OF PRESENT ILLNESS: 77 y.o. female with a past medical history of AFIB on Coumadin, chronic pain /fibromyalgia followed by pain management, hypertension, partial seizures, CKD, anxiety, and obesity and others listed below presents for evaluation of diverticulitis.   06/27/2019 colonoscopy with Dr. Lavon PaganiniNandigam for rectal bleeding, history of first-degree relative with colon cancer showed moderate diverticulosis sigmoid descending transverse and ascending colon, nonbleeding internal hemorrhoids otherwise unremarkable no repeat colonoscopy due to age.  Admitted 01/09/2023 with abdominal pain, nausea, vomiting, diarrhea, headaches, cough and dyspnea.  Empiric antibiotics started, negative C. difficile and GI pathogen panel.  01/09/2023 CT renal stone study for nausea, vomiting abdominal pain diverticulosis with findings of very mild diverticulitis no perforation or abscess.  No renal calculi or obstructive changes noted.  Scarring in lung bases bilaterally. Some concern for pneumonia on chest x-ray a.m. possible small left effusion. Started on IV Zosyn then discharged on Augmentin. 01/11/2023 labs reviewed show chronic thrombocytopenia, platelets 88. No elevated WBC, no  anemia.  Did have WBC 10.8 on 3/4, normal kidney 02/09/2023 INR 6.6 supratherapeutic.  Patient presents for follow-up.  She  reports that she has never smoked. She has never used smokeless tobacco. She reports that she does not drink alcohol and does not use drugs.  RELEVANT LABS AND IMAGING: CBC    Component Value Date/Time   WBC 5.8 01/11/2023 0109   RBC 3.98 01/11/2023 0109   HGB 12.4 01/11/2023 0109   HGB 14.3 04/19/2018 1251   HCT 38.3 01/11/2023 0109   HCT 43.6 04/19/2018 1251   PLT 88 (L) 01/11/2023 0109   PLT 221 04/19/2018 1251   MCV 96.2 01/11/2023 0109   MCV 92 04/19/2018 1251   MCH 31.2 01/11/2023 0109   MCHC 32.4 01/11/2023 0109   RDW 14.3 01/11/2023 0109   RDW 15.4 04/19/2018 1251   LYMPHSABS 0.7 01/09/2023 0040   MONOABS 0.2 01/09/2023 0040   EOSABS 0.0 01/09/2023 0040   BASOSABS 0.0 01/09/2023 0040   Recent Labs    11/23/22 1230 01/09/23 0040 01/09/23 0643 01/10/23 0104 01/11/23 0109  HGB 13.4 14.5 14.1 13.5 12.4     CMP     Component Value Date/Time   NA 136 01/09/2023 0643   NA 143 07/29/2020 1300   K 3.6 01/09/2023 0643   CL 99 01/09/2023 0643   CO2 22 01/09/2023 0643   GLUCOSE 131 (H) 01/09/2023 0643   BUN 9 01/09/2023 0643   BUN 12 07/29/2020 1300   CREATININE 0.85 01/09/2023 0643   CALCIUM 8.9 01/09/2023 0643   PROT 7.5 01/09/2023 0040   PROT 6.8 04/19/2018 1251   ALBUMIN 3.9 01/09/2023 0040   ALBUMIN 4.2 04/19/2018 1251   AST 26 01/09/2023 0040   ALT 17 01/09/2023 0040   ALKPHOS 56 01/09/2023 0040   BILITOT 1.2 01/09/2023 0040   BILITOT 0.5 04/19/2018 1251   GFRNONAA >60 01/09/2023  [id number]   GFRAA 82 07/29/2020 1300      Latest Ref Rng & Units 01/09/2023   12:40 AM 11/23/2022   12:30 PM 07/12/2018    4:47 AM  Hepatic Function  Total Protein 6.5 - 8.1 g/dL 7.5  7.3  6.7   Albumin 3.5 - 5.0 g/dL 3.9  4.2  3.4   AST 15 - 41 U/L 26  21  30    ALT 0 - 44 U/L 17  12  19    Alk Phosphatase 38 - 126 U/L 56  54  49   Total Bilirubin 0.3 -  1.2 mg/dL 1.2  0.6  1.2       Current Medications:    Current Outpatient Medications (Cardiovascular):    amiodarone (PACERONE) 100 MG tablet, Take 1 tablet (100 mg total) by mouth daily.  Current Outpatient Medications (Respiratory):    LEVALBUTEROL HCL IN, Inhale into the lungs daily as needed (for shortness of breath).   promethazine (PHENERGAN) 25 MG tablet, Take 6.25 mg by mouth every 6 (six) hours as needed for nausea or vomiting.  Current Outpatient Medications (Analgesics):    acetaminophen (TYLENOL) 500 MG tablet, Take 500 mg by mouth in the morning, at noon, and at bedtime.   colchicine 0.6 MG tablet, Take 1 tablet (0.6 mg total) by mouth daily for 2 days.   HYDROcodone-acetaminophen (NORCO) 7.5-325 MG tablet, Take 1 tablet by mouth 3 (three) times daily as needed for moderate pain.  Current Outpatient Medications (Hematological):    vitamin B-12 (CYANOCOBALAMIN) 1000 MCG tablet, Take 1,000 mcg by mouth daily.   warfarin (COUMADIN) 5 MG tablet, TAKE 1/2 TO ONE TABLET BY MOUTH DAILY AS DIRECTED BY ANTICOAGULATION CLINIC  Current Outpatient Medications (Other):    Ascorbic Acid (VITAMIN C PO), Take 282 mg by mouth daily.    calcium carbonate (OS-CAL) 600 MG TABS tablet, Take 600 mg by mouth daily with breakfast. With vitamin D 800 units   gabapentin (NEURONTIN) 600 MG tablet, Take 1,500 mg by mouth 3 (three) times daily.   lactulose (CHRONULAC) 10 GM/15ML solution, Take 10mL by mouth daily as needed for constipation   lamoTRIgine (LAMICTAL) 25 MG tablet, Take 1 tablet (25 mg total) by mouth 2 (two) times daily.   meclizine (ANTIVERT) 12.5 MG tablet, Take 1 tablet (12.5 mg total) by mouth 2 (two) times daily as needed for dizziness.   ondansetron (ZOFRAN) 4 MG tablet, Take 4 mg by mouth every 8 (eight) hours as needed for nausea or vomiting.   polyethylene glycol (MIRALAX / GLYCOLAX) packet, Take 4.25 g by mouth daily. Mix 1/4 capful with beverage of choice and drink daily    Vitamin D, Ergocalciferol, (DRISDOL) 1.25 MG (50000 UNIT) CAPS capsule, TAKE 1 CAPSULE BY MOUTH EVERY 10 DAYS (Patient taking differently: Take 50,000 Units by mouth See admin instructions. TAKE 1 CAPSULE BY MOUTH EVERY 10 DAYS)  Medical History:  Past Medical History:  Diagnosis Date   Arthritis    Atrial fibrillation (HCC)    Benign essential tremor    Breast cancer (HCC)    Cataract    Bilateral 2018   Chronic renal insufficiency    Common migraine 05/07/2015   Complication of anesthesia    Depression with anxiety    Drowsiness    Excessive daytime   Dyslipidemia    Fibromyalgia    History of partial seizures    Hypertension    Memory disturbance    Muscle tension headache    Obesity  Obstructive sleep apnea    PONV (postoperative nausea and vomiting)    S/P epidural steroid injection    Sacral contusion 07/2018   Seizures (HCC)    Partial   Sleep apnea    does not wear CPAP   Sprain of neck 06/26/2013   Tremors of nervous system    Vitamin B12 deficiency    Allergies:  Allergies  Allergen Reactions   Diclofenac Nausea Only   Atorvastatin Other (See Comments)    Muscle pain   Tape Other (See Comments)    Peels skin off (bandaids, too) unknown   Albuterol Other (See Comments)    Afib   Lasix [Furosemide]     Dizzy nausea   Mushroom Extract Complex Nausea And Vomiting   Tramadol Other (See Comments) and Nausea And Vomiting   Buprenorphine Hcl Nausea And Vomiting   Codeine Nausea And Vomiting   Fish Oil Nausea And Vomiting   Morphine And Related Nausea And Vomiting     Surgical History:  She  has a past surgical history that includes Brain meningioma excision (Right); mastectomies (Bilateral); Abdominal hysterectomy; breast reconstructive; Breast reconstruction (Bilateral); lumbosacral; Lumbar spine surgery (N/A); Tubal ligation (N/A); gallbladder resection; Cholecystectomy; Excision basal cell carcinoma; Excision basal cell carcinoma; Nose surgery;  dilution; Dilation and curettage of uterus; ablation procedure; Esophageal manometry (N/A, 12/14/2016); and Cataract extraction, bilateral. Family History:  Her family history includes Arthritis in her father and mother; Breast cancer in her sister; Cancer in her father; Cancer - Colon in her father; Colon cancer in her father; Diabetes in her maternal grandfather and mother; Heart disease in her mother; Heart failure in her maternal grandfather and mother; Hyperlipidemia in her mother; Hypertension in her mother; Rectal cancer in her maternal uncle; Stroke in her maternal grandmother and mother.  REVIEW OF SYSTEMS  : All other systems reviewed and negative except where noted in the History of Present Illness.  PHYSICAL EXAM: There were no vitals taken for this visit. General Appearance: Well nourished, in no apparent distress. Head:   Normocephalic and atraumatic. Eyes:  sclerae anicteric,conjunctive pink  Respiratory: Respiratory effort normal, BS equal bilaterally without rales, rhonchi, wheezing. Cardio: RRR with no MRGs. Peripheral pulses intact.  Abdomen: Soft,  {BlankSingle:19197::"Flat","Obese","Non-distended"} ,active bowel sounds. {actendernessAB:27319} tenderness {anatomy; site abdomen:5010}. {BlankMultiple:19196::"Without guarding","With guarding","Without rebound","With rebound"}. No masses. Rectal: {acrectalexam:27461} Musculoskeletal: Full ROM, {PSY - GAIT AND STATION:22860} gait. {With/Without:304960234} edema. Skin:  Dry and intact without significant lesions or rashes Neuro: Alert and  oriented x4;  No focal deficits. Psych:  Cooperative. Normal mood and affect.    Doree Albee, PA-C 1:13 PM

## 2023-02-15 ENCOUNTER — Encounter: Payer: Self-pay | Admitting: Physician Assistant

## 2023-02-15 ENCOUNTER — Ambulatory Visit: Payer: Medicare HMO | Admitting: Physician Assistant

## 2023-02-15 ENCOUNTER — Other Ambulatory Visit (INDEPENDENT_AMBULATORY_CARE_PROVIDER_SITE_OTHER): Payer: Medicare HMO

## 2023-02-15 ENCOUNTER — Ambulatory Visit: Payer: Medicare HMO | Attending: Interventional Cardiology

## 2023-02-15 ENCOUNTER — Ambulatory Visit (INDEPENDENT_AMBULATORY_CARE_PROVIDER_SITE_OTHER)
Admission: RE | Admit: 2023-02-15 | Discharge: 2023-02-15 | Disposition: A | Discharge status: Home / Self Care | Payer: Medicare HMO | Source: Ambulatory Visit | Attending: Physician Assistant | Admitting: Physician Assistant

## 2023-02-15 VITALS — BP 118/68 | HR 58 | Ht 68.0 in | Wt 220.0 lb

## 2023-02-15 DIAGNOSIS — R197 Diarrhea, unspecified: Secondary | ICD-10-CM | POA: Diagnosis not present

## 2023-02-15 DIAGNOSIS — R1013 Epigastric pain: Secondary | ICD-10-CM

## 2023-02-15 DIAGNOSIS — Z5181 Encounter for therapeutic drug level monitoring: Secondary | ICD-10-CM | POA: Diagnosis not present

## 2023-02-15 DIAGNOSIS — R110 Nausea: Secondary | ICD-10-CM

## 2023-02-15 DIAGNOSIS — D696 Thrombocytopenia, unspecified: Secondary | ICD-10-CM | POA: Diagnosis not present

## 2023-02-15 LAB — COMPREHENSIVE METABOLIC PANEL
ALT: 13 U/L (ref 0–35)
AST: 23 U/L (ref 0–37)
Albumin: 4.1 g/dL (ref 3.5–5.2)
Alkaline Phosphatase: 58 U/L (ref 39–117)
BUN: 8 mg/dL (ref 6–23)
CO2: 31 mEq/L (ref 19–32)
Calcium: 9.2 mg/dL (ref 8.4–10.5)
Chloride: 103 mEq/L (ref 96–112)
Creatinine, Ser: 0.88 mg/dL (ref 0.40–1.20)
GFR: 63.82 mL/min (ref >60.00)
Glucose, Bld: 82 mg/dL (ref 70–99)
Potassium: 4.1 mEq/L (ref 3.5–5.1)
Sodium: 143 mEq/L (ref 135–145)
Total Bilirubin: 0.6 mg/dL (ref 0.2–1.2)
Total Protein: 6.8 g/dL (ref 6.0–8.3)

## 2023-02-15 LAB — SEDIMENTATION RATE: Sed Rate: 20 mm/hr (ref 0–30)

## 2023-02-15 LAB — CBC WITH DIFFERENTIAL/PLATELET
Basophils Absolute: 0 10*3/uL (ref 0.0–0.1)
Basophils Relative: 0.9 % (ref 0.0–3.0)
Eosinophils Absolute: 0.1 10*3/uL (ref 0.0–0.7)
Eosinophils Relative: 1.9 % (ref 0.0–5.0)
HCT: 43.2 % (ref 36.0–46.0)
Hemoglobin: 14.5 g/dL (ref 12.0–15.0)
Lymphocytes Relative: 36.8 % (ref 12.0–46.0)
Lymphs Abs: 1.7 10*3/uL (ref 0.7–4.0)
MCHC: 33.5 g/dL (ref 30.0–36.0)
MCV: 94.5 fl (ref 78.0–100.0)
Monocytes Absolute: 0.3 10*3/uL (ref 0.1–1.0)
Monocytes Relative: 7.3 % (ref 3.0–12.0)
Neutro Abs: 2.5 10*3/uL (ref 1.4–7.7)
Neutrophils Relative %: 53.1 % (ref 43.0–77.0)
Platelets: 125 10*3/uL — ABNORMAL LOW (ref 150.0–400.0)
RBC: 4.57 Mil/uL (ref 3.87–5.11)
RDW: 14.8 % (ref 11.5–15.5)
WBC: 4.6 10*3/uL (ref 4.0–10.5)

## 2023-02-15 LAB — LIPASE: Lipase: 2 U/L — ABNORMAL LOW (ref 11.0–59.0)

## 2023-02-15 LAB — POCT INR: INR: 2.3 (ref 2.0–3.0)

## 2023-02-15 MED ORDER — OMEPRAZOLE 40 MG PO CPDR: DELAYED_RELEASE_CAPSULE | ORAL | 3 refills | Status: DC

## 2023-02-15 MED ORDER — ONDANSETRON HCL 8 MG PO TABS: 8.0000 mg | ORAL_TABLET | Freq: Three times a day (TID) | ORAL | 2 refills | Status: DC | PRN

## 2023-02-15 NOTE — Patient Instructions (Signed)
We are ordering a "Diatherix" stool testing for you to evaluate you for Cdiff and stool infection since you had antibiotics in the hospital You have received a kit from our office today containing all necessary supplies to complete this test.  Please carefully read the stool collection instructions provided in the kit before opening the accompanying materials.   Important to remember: -Place the label from the top left corner of the laboratory request sheet onto the "puritan opti-swab" TUBE. -This label should include your full name and date of birth.  -After completing the test, you should secure the tube into the specimen biohazard bag.  -The laboratory request information sheet (including date and time of specimen collection) should be placed into the outside pocket of the specimen biohazard bag and returned to the [facility] lab with 2 days of collection.   If it is greater than two days from collection you will be asked to repeat the test.  If the label is missing from the tube with your name and date of birth on it, you will have to repeat the test.  Any questions please message [place] on my chart or call the office at [phone]  Your provider has requested that you go to the basement level for lab work before leaving today. Press "B" on the elevator. The lab is located at the first door on the left as you exit the elevator.  Your provider has requested that you have an abdominal x ray before leaving today. Please go to the basement floor to our Radiology department for the test.  Please take your proton pump inhibitor medication, Please take this medication 30 minutes to 1 hour before meals- this makes it more effective.  Avoid spicy and acidic foods Avoid fatty foods Limit your intake of coffee, tea, alcohol, and carbonated drinks Work to maintain a healthy weight Keep the head of the bed elevated at least 3 inches with blocks or a wedge pillow if you are having any nighttime  symptoms Stay upright for 2 hours after eating Avoid meals and snacks three to four hours before bedtime Stop smoking    If your blood pressure at your visit was 140/90 or greater, please contact your primary care physician to follow up on this.  _______________________________________________________  If you are age 7 or older, your body mass index should be between 23-30. Your There is no height or weight on file to calculate BMI. If this is out of the aforementioned range listed, please consider follow up with your Primary Care Provider.  If you are age 53 or younger, your body mass index should be between 19-25. Your There is no height or weight on file to calculate BMI. If this is out of the aformentioned range listed, please consider follow up with your Primary Care Provider.   ________________________________________________________  The [facility] providers would like to encourage you to use [facility] to communicate with providers for non-urgent requests or questions.  Due to long hold times on the telephone, sending your provider a message by [facility] may be a faster and more efficient way to get a response.  Please allow 48 business hours for a response.  Please remember that this is for non-urgent requests.  _______________________________________________________ It was a pleasure to see you today!  Thank you for trusting me with your gastrointestinal care!

## 2023-02-15 NOTE — Patient Instructions (Signed)
Description   Continue taking 1/2 tablet daily except for 1 tablet on Sunday, Tuesday, and Thursday.  Recheck INR in 1 week  Be consistent with Boost (2 per week).  [facility] [phone].

## 2023-02-20 ENCOUNTER — Telehealth: Payer: Self-pay | Admitting: Physician Assistant

## 2023-02-20 MED ORDER — PANTOPRAZOLE SODIUM 40 MG PO TBEC: 40.0000 mg | DELAYED_RELEASE_TABLET | Freq: Every day | ORAL | 3 refills | Status: DC

## 2023-02-20 MED ORDER — FAMOTIDINE 40 MG PO TABS: 40.0000 mg | ORAL_TABLET | Freq: Two times a day (BID) | ORAL | 3 refills | Status: DC

## 2023-02-20 NOTE — Telephone Encounter (Signed)
Patient called and stated she started her omeprazole on Saturday and went into AFIB. She is concerned after doing some research that you should not take omeprazole with coumadin. She asks about a different option.  Avrielle also dropped her stool specimen tube in the toilet and needs to pick up a new kit.

## 2023-02-20 NOTE — Telephone Encounter (Signed)
error 

## 2023-02-20 NOTE — Telephone Encounter (Signed)
Patient has been notified and aware of the changes. All questions answered and Rx has been sent to the pharmacy on file.

## 2023-02-21 NOTE — Progress Notes (Signed)
Reviewed and agree with documentation and assessment and plan. K. Veena Kirt Chew , MD   

## 2023-02-24 ENCOUNTER — Telehealth: Payer: Self-pay | Admitting: Physician Assistant

## 2023-02-24 DIAGNOSIS — R197 Diarrhea, unspecified: Secondary | ICD-10-CM

## 2023-02-24 DIAGNOSIS — R1030 Lower abdominal pain, unspecified: Secondary | ICD-10-CM

## 2023-02-24 DIAGNOSIS — R110 Nausea: Secondary | ICD-10-CM

## 2023-02-24 NOTE — Telephone Encounter (Signed)
Negative Diatherix for GI pathogen, Cdiff and H pylori.  No obvious infection in the stool.  Message sent to patient.  Suggest follow up in the office.

## 2023-02-28 ENCOUNTER — Ambulatory Visit: Payer: Medicare HMO | Attending: Interventional Cardiology | Admitting: *Deleted

## 2023-02-28 DIAGNOSIS — I2782 Chronic pulmonary embolism: Secondary | ICD-10-CM

## 2023-02-28 DIAGNOSIS — Z7901 Long term (current) use of anticoagulants: Secondary | ICD-10-CM | POA: Diagnosis not present

## 2023-02-28 DIAGNOSIS — Z5181 Encounter for therapeutic drug level monitoring: Secondary | ICD-10-CM

## 2023-02-28 DIAGNOSIS — Z86711 Personal history of pulmonary embolism: Secondary | ICD-10-CM | POA: Diagnosis not present

## 2023-02-28 LAB — POCT INR: INR: 4.4 — AB (ref 2.0–3.0)

## 2023-02-28 NOTE — Patient Instructions (Addendum)
Description   Do not take any warfarin today and no warfarin tomorrow then START taking 1/2 tablet daily except for 1 tablet on Sunday and Thursday. Recheck INR in 10 days. [facility]in Clinic [phone] or [phone]

## 2023-03-03 MED ORDER — HYOSCYAMINE SULFATE 0.125 MG SL SUBL: 0.1250 mg | SUBLINGUAL_TABLET | Freq: Four times a day (QID) | SUBLINGUAL | 1 refills | Status: AC | PRN

## 2023-03-03 NOTE — Telephone Encounter (Signed)
Has diarrhea 2-3 x a day. She had 2 Bm's after going to bed.  She has had lower AB pain, nausea with it.  No fever, chills.  She has had negative stool studies for infection.  She had Xray with some stool burden.   Will repeat CT AB and pelvis with contrast, repeat labs.  Emphasized that patient needs to go to the hospital if she has not peeing regularly, unable to take oral fluids, vomiting, severe weakness/dizziness, severe abdominal pain , chest pain or shortness of breath.

## 2023-03-03 NOTE — Addendum Note (Signed)
Addended by: Quentin Mulling on: 03/03/2023 03:41 PM   Modules accepted: Orders

## 2023-03-03 NOTE — Telephone Encounter (Signed)
Discussed results with pt and she is aware but would really like Debbie [name] to call her. States it is so hard for her to come in for a visit. She also had questions regarding xray

## 2023-03-03 NOTE — Telephone Encounter (Signed)
Rad scheduling to contact pt to set up CT appt.

## 2023-03-03 NOTE — Telephone Encounter (Signed)
Patient called stating she would like to speak further about her test results. States she is still not feeling well. She is still having diarrhea, abdominal pain, cannot eat, and feeling weak. She would like to speak prior to coming in the office for FU. Please advise. Thank you.

## 2023-03-03 NOTE — Telephone Encounter (Signed)
Left message for pt to call back  °

## 2023-03-10 ENCOUNTER — Ambulatory Visit: Payer: Medicare HMO | Attending: Interventional Cardiology | Admitting: *Deleted

## 2023-03-10 DIAGNOSIS — I4891 Unspecified atrial fibrillation: Secondary | ICD-10-CM | POA: Diagnosis not present

## 2023-03-10 DIAGNOSIS — Z5181 Encounter for therapeutic drug level monitoring: Secondary | ICD-10-CM

## 2023-03-10 LAB — POCT INR: POC INR: 2.6

## 2023-03-10 NOTE — Patient Instructions (Signed)
Description   Continue taking 1/2 tablet daily except for 1 tablet on Sunday and Thursday. Recheck INR  2 weeks. [facility] [phone] or [phone]

## 2023-03-24 ENCOUNTER — Ambulatory Visit (INDEPENDENT_AMBULATORY_CARE_PROVIDER_SITE_OTHER): Payer: Medicare HMO

## 2023-03-24 DIAGNOSIS — Z7901 Long term (current) use of anticoagulants: Secondary | ICD-10-CM

## 2023-03-24 DIAGNOSIS — Z5181 Encounter for therapeutic drug level monitoring: Secondary | ICD-10-CM

## 2023-03-24 DIAGNOSIS — Z86711 Personal history of pulmonary embolism: Secondary | ICD-10-CM

## 2023-03-24 LAB — POCT INR: INR: 2.1 (ref 2.0–3.0)

## 2023-03-24 NOTE — Patient Instructions (Signed)
Continue taking 1/2 tablet daily except for 1 tablet on Sunday and Thursday. Recheck INR  3 weeks. [facility]in Clinic [phone] or [phone]

## 2023-03-25 ENCOUNTER — Ambulatory Visit (HOSPITAL_BASED_OUTPATIENT_CLINIC_OR_DEPARTMENT_OTHER)
Admission: RE | Admit: 2023-03-25 | Discharge: 2023-03-25 | Disposition: A | Discharge status: Home / Self Care | Payer: Medicare HMO | Source: Ambulatory Visit | Attending: Physician Assistant | Admitting: Physician Assistant

## 2023-03-25 DIAGNOSIS — R197 Diarrhea, unspecified: Secondary | ICD-10-CM | POA: Diagnosis present

## 2023-03-25 DIAGNOSIS — Z5181 Encounter for therapeutic drug level monitoring: Secondary | ICD-10-CM | POA: Diagnosis present

## 2023-03-25 DIAGNOSIS — Z86711 Personal history of pulmonary embolism: Secondary | ICD-10-CM | POA: Diagnosis present

## 2023-03-25 DIAGNOSIS — R110 Nausea: Secondary | ICD-10-CM | POA: Insufficient documentation

## 2023-03-25 DIAGNOSIS — Z7901 Long term (current) use of anticoagulants: Secondary | ICD-10-CM | POA: Insufficient documentation

## 2023-03-25 DIAGNOSIS — R1030 Lower abdominal pain, unspecified: Secondary | ICD-10-CM | POA: Diagnosis present

## 2023-03-25 MED ORDER — IOHEXOL 300 MG/ML  SOLN
100.0000 mL | Freq: Once | INTRAMUSCULAR | Status: AC | PRN
  Administered 2023-03-25: 80 mL via INTRAVENOUS

## 2023-03-25 MED ORDER — IOHEXOL 9 MG/ML PO SOLN: 500.0000 mL | ORAL | Status: AC

## 2023-04-12 ENCOUNTER — Ambulatory Visit: Payer: Medicare HMO | Attending: Cardiovascular Disease

## 2023-04-12 DIAGNOSIS — Z5181 Encounter for therapeutic drug level monitoring: Secondary | ICD-10-CM

## 2023-04-12 LAB — POCT INR: INR: 1.7 — AB (ref 2.0–3.0)

## 2023-04-12 NOTE — Patient Instructions (Signed)
Description   Take 1 tablet today and then continue taking 1/2 tablet daily except for 1 tablet on Sunday and Thursday.  Recheck INR  3 weeks.  C[facility]madin Clinic [phone]

## 2023-04-14 ENCOUNTER — Telehealth: Payer: Self-pay

## 2023-04-14 NOTE — Telephone Encounter (Signed)
I spoke to patient who drank some prune juice this morning for constipation so will not drink Boost this upcoming Sunday.  We will check INR 6/26

## 2023-04-26 ENCOUNTER — Telehealth: Payer: Self-pay | Admitting: Cardiology

## 2023-04-26 NOTE — Telephone Encounter (Addendum)
Returned the call to Debbie [name] (on [place]) and there was no answer. The phone continued to ring and no voicemail.   Called the pt's number and her husband answered and stated the pt was at the doctor's office advised will call back at a later time/day.

## 2023-04-26 NOTE — Telephone Encounter (Signed)
Pt daughter called in stating she feels like pt's coumadin is low. She states "she has been very tired and her arms bruising easier". She asked is there anything they can do to raise her coumadin. Please advise.

## 2023-04-27 NOTE — Telephone Encounter (Signed)
Called pt's dtr and there was no answer and voicemail available.

## 2023-04-28 NOTE — Telephone Encounter (Signed)
Called pt's dtr line and no one answered, therefore called pt's line. Spoke with pt and advised that there is no way to know if the INR is up or down unless we check the level.   She states her PCP states that her pulse is low and they recommended that she make an appointment with her Cardiologist. Advised she should follow their recommendations since they advised her on this after she made the initial call to [place].  Also, she states they have been having phone issues so this is the reason they have not called back.

## 2023-05-03 ENCOUNTER — Ambulatory Visit: Payer: Medicare HMO | Attending: Interventional Cardiology | Admitting: *Deleted

## 2023-05-03 DIAGNOSIS — Z7901 Long term (current) use of anticoagulants: Secondary | ICD-10-CM

## 2023-05-03 DIAGNOSIS — Z5181 Encounter for therapeutic drug level monitoring: Secondary | ICD-10-CM | POA: Diagnosis not present

## 2023-05-03 DIAGNOSIS — Z86711 Personal history of pulmonary embolism: Secondary | ICD-10-CM | POA: Diagnosis not present

## 2023-05-03 DIAGNOSIS — I2782 Chronic pulmonary embolism: Secondary | ICD-10-CM

## 2023-05-03 LAB — POCT INR: INR: 1.5 — AB (ref 2.0–3.0)

## 2023-05-03 NOTE — Patient Instructions (Signed)
Description   Take 1 tablet of warfarin today and tomorrow take 1.5 tablets of warfarin then START taking 1/2 tablet daily except for 1 tablet on Sunday, Tuesday, and Thursday.  Recheck INR 2 weeks.  C[facility]madin Clinic [phone]

## 2023-05-08 ENCOUNTER — Telehealth: Payer: Self-pay | Admitting: Cardiology

## 2023-05-08 NOTE — Telephone Encounter (Signed)
Pt is requesting a callback after stating she's concerned about her INR going up and down. She states she been very tired lately as well. Please advise

## 2023-05-08 NOTE — Telephone Encounter (Signed)
Called pt and discussed her concern regarding INR being out of range. Pt's INR was just recently checked on 05/03/23 and pt has an upcoming scheduled appt with Coumadin Clinic on 05/17/23 to have INR rechecked. I confirmed pt is taking the correct Warfarin dose, no missed doses, and she took the extra Warfarin she was instructed to take last week. Pt confirmed appropriate dose. I reminded pt that rechecking INR on 05/17/23 is appropriate to allow time. Pt verbalized understanding.

## 2023-05-08 NOTE — Telephone Encounter (Signed)
Patient concerns with INR fluctuating.  Also regarding amiodarone.  She has multiple questions.  Advised that she could see APP for sooner appt. But refuses as wants to see the doctor only.  Set for 8/26

## 2023-05-17 ENCOUNTER — Ambulatory Visit: Payer: Medicare HMO | Attending: Cardiology

## 2023-05-17 DIAGNOSIS — Z5181 Encounter for therapeutic drug level monitoring: Secondary | ICD-10-CM | POA: Diagnosis not present

## 2023-05-17 LAB — POCT INR: INR: 1.9 — AB (ref 2.0–3.0)

## 2023-05-17 NOTE — Patient Instructions (Signed)
Description   Take 1 tablet of warfarin today and then continue taking 1/2 tablet daily except for 1 tablet on Sunday, Tuesday, and Thursday.  Boost 1-2 times per week.  Recheck INR 2 weeks.  Coumadin Clinic [phone]

## 2023-05-31 ENCOUNTER — Ambulatory Visit: Payer: Medicare HMO | Attending: Interventional Cardiology | Admitting: *Deleted

## 2023-05-31 DIAGNOSIS — Z5181 Encounter for therapeutic drug level monitoring: Secondary | ICD-10-CM

## 2023-05-31 DIAGNOSIS — Z86711 Personal history of pulmonary embolism: Secondary | ICD-10-CM | POA: Diagnosis not present

## 2023-05-31 DIAGNOSIS — Z7901 Long term (current) use of anticoagulants: Secondary | ICD-10-CM

## 2023-05-31 DIAGNOSIS — I2782 Chronic pulmonary embolism: Secondary | ICD-10-CM

## 2023-05-31 LAB — POCT INR: INR: 2.3 (ref 2.0–3.0)

## 2023-05-31 NOTE — Patient Instructions (Signed)
Description   Continue taking warfarin 1/2 tablet daily except for 1 tablet on Sunday, Tuesday, and Thursday.  Boost or slaw 1 times per week.  Recheck INR 3 weeks.  Coumadin Clinic [phone]

## 2023-06-08 ENCOUNTER — Other Ambulatory Visit: Payer: Self-pay | Admitting: Cardiology

## 2023-06-08 DIAGNOSIS — I480 Paroxysmal atrial fibrillation: Secondary | ICD-10-CM

## 2023-06-08 NOTE — Telephone Encounter (Signed)
Refill request for warfarin:  Last INR was 2.3 on 05/31/23 Next INR due 06/19/23 LOV was 12/31/21 (Appt 07/03/23)  Refill approved.

## 2023-06-19 ENCOUNTER — Ambulatory Visit: Payer: Medicare HMO | Attending: Cardiology

## 2023-06-19 DIAGNOSIS — Z5181 Encounter for therapeutic drug level monitoring: Secondary | ICD-10-CM | POA: Diagnosis not present

## 2023-06-19 LAB — POCT INR: INR: 1.8 — AB (ref 2.0–3.0)

## 2023-06-19 NOTE — Patient Instructions (Addendum)
Description   Take 1 tablet today and then continue taking warfarin 1/2 tablet daily except for 1 tablet on Sunday, Tuesday, and Thursday.  Slaw 1 times per week.  Recheck INR 2 weeks.  C[facility]madin Clinic [phone]

## 2023-06-26 ENCOUNTER — Other Ambulatory Visit: Payer: Self-pay

## 2023-06-26 DIAGNOSIS — I480 Paroxysmal atrial fibrillation: Secondary | ICD-10-CM

## 2023-06-26 MED ORDER — AMIODARONE HCL 100 MG PO TABS
100.0000 mg | ORAL_TABLET | Freq: Every day | ORAL | 0 refills | Status: DC
Start: 2023-06-26 — End: 2024-06-07

## 2023-07-03 ENCOUNTER — Encounter (HOSPITAL_BASED_OUTPATIENT_CLINIC_OR_DEPARTMENT_OTHER): Payer: Self-pay | Admitting: Cardiology

## 2023-07-03 ENCOUNTER — Ambulatory Visit (HOSPITAL_BASED_OUTPATIENT_CLINIC_OR_DEPARTMENT_OTHER): Payer: Medicare HMO | Admitting: Cardiology

## 2023-07-03 ENCOUNTER — Ambulatory Visit: Payer: Medicare HMO | Attending: Cardiology

## 2023-07-03 VITALS — BP 131/68 | HR 57 | Ht 68.0 in | Wt 220.7 lb

## 2023-07-03 DIAGNOSIS — Z7901 Long term (current) use of anticoagulants: Secondary | ICD-10-CM

## 2023-07-03 DIAGNOSIS — Z5181 Encounter for therapeutic drug level monitoring: Secondary | ICD-10-CM

## 2023-07-03 DIAGNOSIS — R5383 Other fatigue: Secondary | ICD-10-CM

## 2023-07-03 DIAGNOSIS — I700 Atherosclerosis of aorta: Secondary | ICD-10-CM | POA: Diagnosis not present

## 2023-07-03 DIAGNOSIS — Z712 Person consulting for explanation of examination or test findings: Secondary | ICD-10-CM | POA: Diagnosis not present

## 2023-07-03 DIAGNOSIS — I480 Paroxysmal atrial fibrillation: Secondary | ICD-10-CM | POA: Diagnosis not present

## 2023-07-03 LAB — POCT INR: INR: 1.7 — AB (ref 2.0–3.0)

## 2023-07-03 NOTE — Progress Notes (Signed)
Cardiology Office Note:  .    Date:  07/03/2023  ID:  Debbie [name], DOB 04/10/1946, MRN [id number] PCP: Patient, No Pcp Per  [facility] Providers Cardiologist:  Jodelle Red, MD     History of Present Illness: .    Debbie [name] is a 77 y.o. female with a hx of persistent atrial fibrillation s/p ablation--now paroxysmal, pulmonary embolism (2017), hyperlipidemia, chronic kidney disease stage III, obesity, sleep apnea not on CPAP, fibromyalgia, headaches who is seen in follow up.    She was previously seen by Dr. Okey Dupre on 05/28/2018. At that visit, the plan was to continue anticoagulation, continue diltiazem (both standing and as needed). Recent monitor did not show sustained arrhythmias. She declined treatment for her OSA. I first met her on 09/03/18. Noted that she is very symptomatic with her atrial fibrillation.   At her visit 12/2021, she complained of feeling weak with malaise. She was struggling with severe sensitivity and pain in her bilateral LE. Per her home blood pressure log, readings were averaging in the upper 120s-130s/60s-70s. Since beginning amiodarone she may have had one minor episode of Afib. Planned to call us if she developed worsening symptoms. In 01/2023 she was hospitalized with symptoms concerning for diverticulitis and pneumonia.   Today, she is accompanied by a family member. She complains of feeling weak constantly and struggles with frequent dizziness, wonders if this is due to the amiodarone. Her pulse has averaged in the 50s lately. Additionally she complains of drowsiness as a side effect of her medications.  Formal exercise is significantly limited due to chronic bilateral LE pain.  She was concerned about aortic atherosclerosis noted on her CT in 03/2023. We reviewed this in detail. Discussed statin therapy.   In the office today her blood pressure is 131/68. Home blood pressure log is personally reviewed.  Was previously on colchicine  for gout; subsequently discontinued due to hair loss.   She reports that she is in need of oral surgery. She has been postponing this due to the warfarin.  She denies any palpitations, chest pain, shortness of breath, peripheral edema, headaches, syncope, orthopnea, or PND.  ROS:  Please see the history of present illness. ROS otherwise negative except as noted.  (+) Generalized weakness (+) Frequent dizziness (+) Drowsiness (+) Chronic BLE pain  Studies Reviewed: .         CT Abdomen/Pelvis  03/25/2023: IMPRESSION: 1. No acute intra-abdominal or pelvic pathology. 2. Sigmoid diverticulosis. No bowel obstruction. 3. Probable mild cirrhosis. 4.  Aortic Atherosclerosis (ICD10-I70.0).  Physical Exam:    VS:  BP 131/68 (BP Location: Left Arm, Patient Position: Sitting, Cuff Size: Normal)   Pulse (!) 57   Ht 5\' 8"  (1.727 m)   Wt 220 lb 11.2 oz (100.1 kg)   SpO2 93%   BMI 33.56 kg/m    Wt Readings from Last 3 Encounters:  07/03/23 220 lb 11.2 oz (100.1 kg)  02/15/23 220 lb (99.8 kg)  01/12/23 224 lb 1.6 oz (101.7 kg)    GEN: Well nourished, well developed in no acute distress HEENT: Normal, moist mucous membranes NECK: No JVD CARDIAC: regular rhythm, normal S1 and S2, no rubs or gallops. No murmur. VASCULAR: Radial and DP pulses 2+ bilaterally. No carotid bruits RESPIRATORY:  Clear to auscultation without rales, wheezing or rhonchi  ABDOMEN: Soft, non-tender, non-distended MUSCULOSKELETAL:  Ambulates independently SKIN: Warm and dry, no edema NEUROLOGIC:  Alert and oriented x 3. No focal neuro deficits noted. PSYCHIATRIC:  Normal affect   ASSESSMENT AND PLAN: .    Shortness of breath, fatigue Paroxysmal atrial fibrillation, in sinus rhythm, on long term anticoagulation: -doing very well on long term amiodarone -declines CPAP for OSA, cannot tolerate -CHA2DS2/VAS Stroke Risk Points =5, currently on coumadin. Have discussed DOAC in the past, declines until there is a  generic available. Also discussed Watchman, she declines -will call with palpitations or worsening symptoms   Fibromyalgia, diffuse pain: -working with her pain management team  Aortic atherosclerosis -discussed her test results, recommendation for statin at length today  Dispo: Follow-up in 6 months, or sooner as needed.  I,Mathew Stumpf,acting as a Neurosurgeon for [employer], MD.,have documented all relevant documentation on the behalf of Jodelle Red, MD,as directed by  Jodelle Red, MD while in the presence of Jodelle Red, MD.  I, Jodelle Red, MD, have reviewed all documentation for this visit. The documentation on 08/21/23 for the exam, diagnosis, procedures, and orders are all accurate and complete.   Signed, Jodelle Red, MD

## 2023-07-03 NOTE — Patient Instructions (Signed)
Description   Take 1 tablet today and then START taking warfarin 1/2 tablet daily except for 1 tablet on Sunday, Tuesday, Thursday and Saturday.  Stay consistent with greens - 1 time per week.  Recheck INR 2 weeks.  [facility] [phone]

## 2023-07-03 NOTE — Patient Instructions (Signed)

## 2023-07-17 ENCOUNTER — Ambulatory Visit: Payer: Medicare HMO | Attending: Cardiovascular Disease

## 2023-07-17 DIAGNOSIS — Z5181 Encounter for therapeutic drug level monitoring: Secondary | ICD-10-CM | POA: Diagnosis not present

## 2023-07-17 LAB — POCT INR: INR: 2.5 (ref 2.0–3.0)

## 2023-07-17 NOTE — Patient Instructions (Signed)
Description   Continue taking warfarin 1 tablet daily except for 0.5 tablet on Mondays, Wednesdays, and Fridays.  Stay consistent with greens - 1 time per week.  Recheck INR 3 weeks.  [facility] [phone]

## 2023-08-07 ENCOUNTER — Ambulatory Visit: Payer: Medicare HMO | Attending: Internal Medicine

## 2023-08-07 DIAGNOSIS — Z86711 Personal history of pulmonary embolism: Secondary | ICD-10-CM

## 2023-08-07 DIAGNOSIS — Z7901 Long term (current) use of anticoagulants: Secondary | ICD-10-CM | POA: Diagnosis not present

## 2023-08-07 DIAGNOSIS — Z5181 Encounter for therapeutic drug level monitoring: Secondary | ICD-10-CM

## 2023-08-07 LAB — POCT INR: INR: 3.5 — AB (ref 2.0–3.0)

## 2023-08-07 NOTE — Patient Instructions (Signed)
HOLD TODAY ONLY THEN Continue taking warfarin 1 tablet daily except for 0.5 tablet on Mondays, Wednesdays, and Fridays.  Stay consistent with greens - 1 time per week.  Recheck INR 2 weeks.  [facility] [phone]

## 2023-08-21 ENCOUNTER — Encounter (HOSPITAL_BASED_OUTPATIENT_CLINIC_OR_DEPARTMENT_OTHER): Payer: Self-pay | Admitting: Cardiology

## 2023-08-21 ENCOUNTER — Ambulatory Visit: Payer: Medicare HMO | Attending: Cardiology

## 2023-08-21 DIAGNOSIS — Z7901 Long term (current) use of anticoagulants: Secondary | ICD-10-CM | POA: Diagnosis not present

## 2023-08-21 DIAGNOSIS — Z5181 Encounter for therapeutic drug level monitoring: Secondary | ICD-10-CM | POA: Diagnosis not present

## 2023-08-21 DIAGNOSIS — Z86711 Personal history of pulmonary embolism: Secondary | ICD-10-CM | POA: Diagnosis not present

## 2023-08-21 LAB — POCT INR: INR: 3 (ref 2.0–3.0)

## 2023-08-21 NOTE — Patient Instructions (Signed)
Continue taking warfarin 1 tablet daily except for 0.5 tablet on Mondays, Wednesdays, and Fridays.  Stay consistent with Boost - 1 time per week.  Recheck INR 4 weeks.  [facility] [phone]

## 2023-09-18 ENCOUNTER — Ambulatory Visit: Payer: Medicare HMO | Attending: Cardiology

## 2023-09-18 DIAGNOSIS — Z7901 Long term (current) use of anticoagulants: Secondary | ICD-10-CM

## 2023-09-18 DIAGNOSIS — Z86711 Personal history of pulmonary embolism: Secondary | ICD-10-CM

## 2023-09-18 DIAGNOSIS — Z5181 Encounter for therapeutic drug level monitoring: Secondary | ICD-10-CM | POA: Diagnosis not present

## 2023-09-18 LAB — POCT INR: INR: 3.4 — AB (ref 2.0–3.0)

## 2023-09-18 NOTE — Patient Instructions (Signed)
HOLD TONIGHT ONLY THEN Continue taking warfarin 1 tablet daily except for 0.5 tablet on Mondays, Wednesdays, and Fridays.  Stay consistent with Boost - 1 time per week AND EAT 1 GREEN PER WEEK. Recheck INR 4 weeks.  [facility] [phone]

## 2023-10-16 ENCOUNTER — Ambulatory Visit: Payer: Medicare HMO | Attending: Cardiovascular Disease

## 2023-10-16 DIAGNOSIS — Z7901 Long term (current) use of anticoagulants: Secondary | ICD-10-CM | POA: Diagnosis not present

## 2023-10-16 DIAGNOSIS — Z86711 Personal history of pulmonary embolism: Secondary | ICD-10-CM

## 2023-10-16 DIAGNOSIS — Z5181 Encounter for therapeutic drug level monitoring: Secondary | ICD-10-CM | POA: Diagnosis not present

## 2023-10-16 LAB — POCT INR: INR: 3.5 — AB (ref 2.0–3.0)

## 2023-10-16 NOTE — Patient Instructions (Signed)
HOLD TONIGHT ONLY THEN Continue taking warfarin 1 tablet daily except for 0.5 tablet on Mondays, Wednesdays, and Fridays.  Stay consistent with Boost - 2 times per week. Recheck INR 4 weeks.  [facility] [phone]

## 2023-11-16 ENCOUNTER — Ambulatory Visit: Payer: Medicare HMO | Attending: Cardiology | Admitting: *Deleted

## 2023-11-16 DIAGNOSIS — Z7901 Long term (current) use of anticoagulants: Secondary | ICD-10-CM

## 2023-11-16 DIAGNOSIS — I2782 Chronic pulmonary embolism: Secondary | ICD-10-CM

## 2023-11-16 DIAGNOSIS — Z86711 Personal history of pulmonary embolism: Secondary | ICD-10-CM | POA: Diagnosis not present

## 2023-11-16 DIAGNOSIS — Z5181 Encounter for therapeutic drug level monitoring: Secondary | ICD-10-CM | POA: Diagnosis not present

## 2023-11-16 LAB — POCT INR: INR: 2.9 (ref 2.0–3.0)

## 2023-11-16 NOTE — Patient Instructions (Signed)
 Description   Continue taking warfarin 1 tablet daily except for 0.5 tablet on Mondays, Wednesdays, and Fridays.  Stay consistent with Boost - 2 times per week. Recheck INR 4 weeks.  [facility] [phone]

## 2023-12-14 ENCOUNTER — Ambulatory Visit: Payer: Medicare HMO | Attending: Interventional Cardiology

## 2023-12-14 DIAGNOSIS — Z5181 Encounter for therapeutic drug level monitoring: Secondary | ICD-10-CM | POA: Diagnosis not present

## 2023-12-14 LAB — POCT INR: INR: 2.8 (ref 2.0–3.0)

## 2023-12-14 NOTE — Patient Instructions (Signed)
 Description   Continue taking warfarin 1 tablet daily except for 0.5 tablet on Mondays, Wednesdays, and Fridays.  Stay consistent with Boost - 2 times per week. Recheck INR 5 weeks.  [facility]in  Clinic [phone]

## 2023-12-15 ENCOUNTER — Other Ambulatory Visit: Payer: Self-pay

## 2023-12-15 DIAGNOSIS — I480 Paroxysmal atrial fibrillation: Secondary | ICD-10-CM

## 2023-12-15 MED ORDER — WARFARIN SODIUM 5 MG PO TABS: ORAL_TABLET | ORAL | 1 refills | Status: DC

## 2023-12-15 NOTE — Telephone Encounter (Signed)
 Prescription refill request received for warfarin Lov: 07/03/23 Debbie [name])  Next INR check: 01/18/24 Warfarin tablet strength: 5mg   Appropriate dose. Refill sent.

## 2024-01-18 ENCOUNTER — Ambulatory Visit: Payer: Medicare HMO | Attending: Cardiology

## 2024-01-18 DIAGNOSIS — Z5181 Encounter for therapeutic drug level monitoring: Secondary | ICD-10-CM | POA: Diagnosis not present

## 2024-01-18 LAB — POCT INR: INR: 3.4 — AB (ref 2.0–3.0)

## 2024-01-18 NOTE — Patient Instructions (Signed)
 Description   HOLD today's dose and then continue taking warfarin 1 tablet daily except for 0.5 tablet on Mondays, Wednesdays, and Fridays.  Stay consistent with Boost - 2 times per week. Recheck INR 4 weeks.  [facility] [phone]

## 2024-01-24 ENCOUNTER — Encounter: Payer: Self-pay | Admitting: Internal Medicine

## 2024-01-24 ENCOUNTER — Ambulatory Visit: Payer: Medicare HMO | Admitting: Internal Medicine

## 2024-01-24 NOTE — Patient Instructions (Addendum)
 [facility]: [address], [place], [place] [address] Phone: [phone]  [facility] [address], [place], [place] [address] Phone: [phone]

## 2024-01-24 NOTE — Progress Notes (Signed)
   Subjective:   Patient ID: Debbie [name], female    DOB: 1945/12/20, 78 y.o.   MRN: [id number]  HPI The patient is a 78 YO female coming in thinking we were a neurologist does not want to change primary care providers  Review of Systems  Objective:  Physical Exam  Vitals:   01/24/24 1041  BP: 136/82  Pulse: 74  Temp: 97.9 F (36.6 C)  TempSrc: Oral  SpO2: 93%  Weight: 220 lb (99.8 kg)  Height: 5\' 8"  (1.727 m)    Assessment & Plan:

## 2024-02-15 ENCOUNTER — Ambulatory Visit: Attending: Cardiovascular Disease

## 2024-02-15 DIAGNOSIS — Z5181 Encounter for therapeutic drug level monitoring: Secondary | ICD-10-CM | POA: Diagnosis not present

## 2024-02-15 LAB — POCT INR: INR: 4 — AB (ref 2.0–3.0)

## 2024-02-15 NOTE — Patient Instructions (Addendum)
 Description   HOLD today's dose and then START taking warfarin 1 tablet daily except for 0.5 tablet on Sundays, Mondays, Wednesdays, and Fridays.  Stay consistent with Boost - 2 times per week. Recheck INR 3 weeks.  Coumadin Clinic [phone]

## 2024-03-07 ENCOUNTER — Ambulatory Visit: Attending: Interventional Cardiology | Admitting: *Deleted

## 2024-03-07 DIAGNOSIS — I4891 Unspecified atrial fibrillation: Secondary | ICD-10-CM

## 2024-03-07 DIAGNOSIS — Z5181 Encounter for therapeutic drug level monitoring: Secondary | ICD-10-CM | POA: Diagnosis not present

## 2024-03-07 LAB — POCT INR: POC INR: 2.8

## 2024-03-07 NOTE — Patient Instructions (Signed)
 Description   Continue taking warfarin 1 tablet daily except for 0.5 tablet on Sundays, Mondays, Wednesdays, and Fridays.  Stay consistent with Boost - 2 times per week. Recheck INR 4 weeks.  Coumadin  Clinic [phone]

## 2024-04-04 ENCOUNTER — Ambulatory Visit: Attending: Interventional Cardiology | Admitting: *Deleted

## 2024-04-04 DIAGNOSIS — I4891 Unspecified atrial fibrillation: Secondary | ICD-10-CM

## 2024-04-04 DIAGNOSIS — Z5181 Encounter for therapeutic drug level monitoring: Secondary | ICD-10-CM | POA: Diagnosis not present

## 2024-04-04 LAB — POCT INR
POC INR: 2.4
POC INR: 2.4

## 2024-04-04 NOTE — Patient Instructions (Signed)
 Description   Continue taking warfarin 1 tablet daily except for 0.5 tablet on Sundays, Mondays, Wednesdays, and Fridays.  Stay consistent with Boost - 2 times per week. Recheck INR 5 weeks.  [facility]in  Clinic [phone]

## 2024-05-09 ENCOUNTER — Ambulatory Visit: Attending: Interventional Cardiology | Admitting: Pharmacist

## 2024-05-09 DIAGNOSIS — Z5181 Encounter for therapeutic drug level monitoring: Secondary | ICD-10-CM | POA: Diagnosis not present

## 2024-05-09 DIAGNOSIS — Z7901 Long term (current) use of anticoagulants: Secondary | ICD-10-CM

## 2024-05-09 DIAGNOSIS — I4891 Unspecified atrial fibrillation: Secondary | ICD-10-CM | POA: Diagnosis not present

## 2024-05-09 DIAGNOSIS — Z86711 Personal history of pulmonary embolism: Secondary | ICD-10-CM

## 2024-05-09 DIAGNOSIS — I2782 Chronic pulmonary embolism: Secondary | ICD-10-CM | POA: Diagnosis not present

## 2024-05-09 LAB — POCT INR: INR: 2.2 (ref 2.0–3.0)

## 2024-05-09 NOTE — Progress Notes (Signed)
Please see anticoagulation encounter.

## 2024-05-09 NOTE — Patient Instructions (Signed)
 Description   Continue taking warfarin 1 tablet daily except for 0.5 tablet on Sundays, Mondays, Wednesdays, and Fridays.  Stay consistent with Boost - 2 times per week. Recheck INR 5 weeks.  [facility]in  Clinic [phone]

## 2024-06-01 ENCOUNTER — Other Ambulatory Visit: Payer: Self-pay | Admitting: Cardiology

## 2024-06-01 DIAGNOSIS — I480 Paroxysmal atrial fibrillation: Secondary | ICD-10-CM

## 2024-06-07 ENCOUNTER — Other Ambulatory Visit: Payer: Self-pay

## 2024-06-07 DIAGNOSIS — I480 Paroxysmal atrial fibrillation: Secondary | ICD-10-CM

## 2024-06-07 MED ORDER — AMIODARONE HCL 100 MG PO TABS: 100.0000 mg | ORAL_TABLET | Freq: Every day | ORAL | 0 refills | Status: DC

## 2024-06-12 DIAGNOSIS — Z9889 Other specified postprocedural states: Secondary | ICD-10-CM | POA: Insufficient documentation

## 2024-06-12 DIAGNOSIS — N390 Urinary tract infection, site not specified: Secondary | ICD-10-CM | POA: Insufficient documentation

## 2024-06-12 DIAGNOSIS — Z9049 Acquired absence of other specified parts of digestive tract: Secondary | ICD-10-CM | POA: Insufficient documentation

## 2024-06-12 DIAGNOSIS — J320 Chronic maxillary sinusitis: Secondary | ICD-10-CM | POA: Insufficient documentation

## 2024-06-12 DIAGNOSIS — R21 Rash and other nonspecific skin eruption: Secondary | ICD-10-CM | POA: Insufficient documentation

## 2024-06-12 DIAGNOSIS — G4452 New daily persistent headache (NDPH): Secondary | ICD-10-CM | POA: Insufficient documentation

## 2024-06-12 DIAGNOSIS — L309 Dermatitis, unspecified: Secondary | ICD-10-CM | POA: Insufficient documentation

## 2024-06-12 DIAGNOSIS — R54 Age-related physical debility: Secondary | ICD-10-CM | POA: Insufficient documentation

## 2024-06-12 DIAGNOSIS — F5101 Primary insomnia: Secondary | ICD-10-CM | POA: Insufficient documentation

## 2024-06-12 DIAGNOSIS — E66811 Obesity, class 1: Secondary | ICD-10-CM | POA: Insufficient documentation

## 2024-06-12 DIAGNOSIS — Z681 Body mass index (BMI) 19 or less, adult: Secondary | ICD-10-CM | POA: Insufficient documentation

## 2024-06-12 DIAGNOSIS — R269 Unspecified abnormalities of gait and mobility: Secondary | ICD-10-CM | POA: Insufficient documentation

## 2024-06-12 DIAGNOSIS — K589 Irritable bowel syndrome without diarrhea: Secondary | ICD-10-CM | POA: Insufficient documentation

## 2024-06-12 DIAGNOSIS — M62838 Other muscle spasm: Secondary | ICD-10-CM | POA: Insufficient documentation

## 2024-06-12 DIAGNOSIS — K645 Perianal venous thrombosis: Secondary | ICD-10-CM | POA: Insufficient documentation

## 2024-06-12 DIAGNOSIS — F331 Major depressive disorder, recurrent, moderate: Secondary | ICD-10-CM | POA: Insufficient documentation

## 2024-06-12 DIAGNOSIS — D6859 Other primary thrombophilia: Secondary | ICD-10-CM | POA: Insufficient documentation

## 2024-06-12 DIAGNOSIS — I700 Atherosclerosis of aorta: Secondary | ICD-10-CM | POA: Insufficient documentation

## 2024-06-12 DIAGNOSIS — E441 Mild protein-calorie malnutrition: Secondary | ICD-10-CM | POA: Insufficient documentation

## 2024-06-12 DIAGNOSIS — D684 Acquired coagulation factor deficiency: Secondary | ICD-10-CM | POA: Insufficient documentation

## 2024-06-13 ENCOUNTER — Ambulatory Visit: Attending: Cardiology | Admitting: *Deleted

## 2024-06-13 DIAGNOSIS — Z5181 Encounter for therapeutic drug level monitoring: Secondary | ICD-10-CM | POA: Diagnosis not present

## 2024-06-13 DIAGNOSIS — I4891 Unspecified atrial fibrillation: Secondary | ICD-10-CM | POA: Diagnosis not present

## 2024-06-13 LAB — POCT INR: POC INR: 2

## 2024-06-13 NOTE — Progress Notes (Signed)
 INR 2.0 Please see anticoagulation encounter.

## 2024-06-13 NOTE — Patient Instructions (Signed)
 Description   Continue taking warfarin 1 tablet daily except for 0.5 tablet on Sundays, Mondays, Wednesdays, and Fridays.  Stay consistent with Boost - 2 times per week. Recheck INR 6 weeks.  [facility]  Clinic [phone]

## 2024-06-17 ENCOUNTER — Encounter: Payer: Self-pay | Admitting: Neurology

## 2024-06-17 ENCOUNTER — Ambulatory Visit: Admitting: Neurology

## 2024-06-17 VITALS — BP 171/84 | HR 67 | Resp 16 | Ht 69.0 in | Wt 234.0 lb

## 2024-06-17 DIAGNOSIS — R4189 Other symptoms and signs involving cognitive functions and awareness: Secondary | ICD-10-CM | POA: Diagnosis not present

## 2024-06-17 DIAGNOSIS — R251 Tremor, unspecified: Secondary | ICD-10-CM | POA: Diagnosis not present

## 2024-06-17 DIAGNOSIS — R269 Unspecified abnormalities of gait and mobility: Secondary | ICD-10-CM
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
## 2024-06-19 ENCOUNTER — Telehealth: Payer: Self-pay | Admitting: Neurology

## 2024-06-19 NOTE — Telephone Encounter (Signed)
 Order sent to GI to schedule [phone]

## 2024-06-27 ENCOUNTER — Ambulatory Visit: Admitting: Neurology

## 2024-06-27 DIAGNOSIS — R251 Tremor, unspecified: Secondary | ICD-10-CM

## 2024-06-27 DIAGNOSIS — R4189 Other symptoms and signs involving cognitive functions and awareness: Secondary | ICD-10-CM

## 2024-06-27 DIAGNOSIS — R4182 Altered mental status, unspecified: Secondary | ICD-10-CM | POA: Diagnosis not present

## 2024-06-27 DIAGNOSIS — R269 Unspecified abnormalities of gait and mobility: Secondary | ICD-10-CM

## 2024-06-28 LAB — THYROID PANEL WITH TSH
Free Thyroxine Index: 2.8 (ref 1.2–4.9)
T3 Uptake Ratio: 26 % (ref 24–39)
T4, Total: 10.6 ug/dL (ref 4.5–12.0)
TSH: 2.51 u[IU]/mL (ref 0.450–4.500)

## 2024-06-28 LAB — VITAMIN B12: Vitamin B-12: 438 pg/mL (ref 232–1245)

## 2024-06-28 LAB — CBC WITH DIFFERENTIAL/PLATELET
Basophils Absolute: 0 x10E3/uL (ref 0.0–0.2)
Basos: 1 %
EOS (ABSOLUTE): 0.1 x10E3/uL (ref 0.0–0.4)
Eos: 3 %
Hematocrit: 41 % (ref 34.0–46.6)
Hemoglobin: 13.2 g/dL (ref 11.1–15.9)
Immature Grans (Abs): 0 x10E3/uL (ref 0.0–0.1)
Immature Granulocytes: 0 %
Lymphocytes Absolute: 1.4 x10E3/uL (ref 0.7–3.1)
Lymphs: 32 %
MCH: 31.7 pg (ref 26.6–33.0)
MCHC: 32.2 g/dL (ref 31.5–35.7)
MCV: 98 fL — ABNORMAL HIGH (ref 79–97)
Monocytes Absolute: 0.4 x10E3/uL (ref 0.1–0.9)
Monocytes: 9 %
Neutrophils Absolute: 2.3 x10E3/uL (ref 1.4–7.0)
Neutrophils: 55 %
Platelets: 107 x10E3/uL — ABNORMAL LOW (ref 150–450)
RBC: 4.17 x10E6/uL (ref 3.77–5.28)
RDW: 12.5 % (ref 11.7–15.4)
WBC: 4.2 x10E3/uL (ref 3.4–10.8)

## 2024-06-28 LAB — APOE ALZHEIMER'S RISK

## 2024-06-28 LAB — COMPREHENSIVE METABOLIC PANEL WITH GFR
ALT: 10 IU/L (ref 0–32)
AST: 28 IU/L (ref 0–40)
Albumin: 4.1 g/dL (ref 3.8–4.8)
Alkaline Phosphatase: 67 IU/L (ref 44–121)
BUN/Creatinine Ratio: 9 — ABNORMAL LOW (ref 12–28)
BUN: 7 mg/dL — ABNORMAL LOW (ref 8–27)
Bilirubin Total: 0.4 mg/dL (ref 0.0–1.2)
CO2: 25 mmol/L (ref 20–29)
Calcium: 9.3 mg/dL (ref 8.7–10.3)
Chloride: 104 mmol/L (ref 96–106)
Creatinine, Ser: 0.82 mg/dL (ref 0.57–1.00)
Globulin, Total: 2.3 g/dL (ref 1.5–4.5)
Glucose: 97 mg/dL (ref 70–99)
Potassium: 4.1 mmol/L (ref 3.5–5.2)
Sodium: 144 mmol/L (ref 134–144)
Total Protein: 6.4 g/dL (ref 6.0–8.5)
eGFR: 74 mL/min/1.73 (ref >59)

## 2024-06-28 LAB — SEDIMENTATION RATE: Sed Rate: 17 mm/h (ref 0–40)

## 2024-06-28 LAB — RPR: RPR Ser Ql: NONREACTIVE

## 2024-06-28 LAB — CK: Total CK: 80 U/L (ref 32–182)

## 2024-06-28 LAB — VITAMIN D 25 HYDROXY (VIT D DEFICIENCY, FRACTURES): Vit D, 25-Hydroxy: 56.3 ng/mL (ref 30.0–100.0)

## 2024-06-28 LAB — C-REACTIVE PROTEIN: CRP: 5 mg/L (ref 0–10)

## 2024-06-28 LAB — ANA W/REFLEX IF POSITIVE: Anti Nuclear Antibody (ANA): NEGATIVE

## 2024-07-02 ENCOUNTER — Ambulatory Visit: Payer: Self-pay | Admitting: Neurology

## 2024-07-05 ENCOUNTER — Other Ambulatory Visit: Payer: Self-pay | Admitting: Cardiology

## 2024-07-05 DIAGNOSIS — I480 Paroxysmal atrial fibrillation: Secondary | ICD-10-CM

## 2024-07-07 NOTE — Procedures (Signed)
   HISTORY: 78 year old female history of seizure, meningioma  TECHNIQUE:  This is a routine 16 channel EEG recording with one channel devoted to a limited EKG recording.  It was performed during wakefulness, drowsiness and asleep.  Hyperventilation and photic stimulation were performed as activating procedures.  There are minimum muscle and movement artifact noted.  Upon maximum arousal, posterior dominant waking rhythm consistent of mildly dysarrhythmic rhythmic alpha range activity. Activities are symmetric over the bilateral posterior derivations and attenuated with eye opening.  Photic stimulation did not alter the tracing.  Hyperventilation produced mild/moderate buildup with higher amplitude and the slower activities noted.  During EEG recording, patient developed drowsiness and no deeper stage of sleep was achieved  During EEG recording, there was no epileptiform discharge noted.  EKG demonstrate normal sinus rhythm.  CONCLUSION: This is a  normal awake EEG.  There is no electrodiagnostic evidence of epileptiform discharge.  Klayton Monie, M.D. Ph.D.  [facility] [address] [place], [place] [address] Phone: [phone] Fax:      [phone]

## 2024-07-14 ENCOUNTER — Ambulatory Visit
Admission: RE | Admit: 2024-07-14 | Discharge: 2024-07-14 | Disposition: A | Discharge status: Home / Self Care | Source: Ambulatory Visit | Attending: Neurology | Admitting: Neurology

## 2024-07-14 DIAGNOSIS — R269 Unspecified abnormalities of gait and mobility: Secondary | ICD-10-CM

## 2024-07-14 DIAGNOSIS — R4189 Other symptoms and signs involving cognitive functions and awareness: Secondary | ICD-10-CM | POA: Diagnosis not present

## 2024-07-14 DIAGNOSIS — R251 Tremor, unspecified: Secondary | ICD-10-CM

## 2024-07-14 MED ORDER — GADOPICLENOL 0.5 MMOL/ML IV SOLN
10.0000 mL | Freq: Once | INTRAVENOUS | Status: AC | PRN
  Administered 2024-07-14: 10 mL via INTRAVENOUS

## 2024-07-17 ENCOUNTER — Other Ambulatory Visit: Payer: Self-pay | Admitting: Cardiology

## 2024-07-17 DIAGNOSIS — I480 Paroxysmal atrial fibrillation: Secondary | ICD-10-CM

## 2024-07-25 ENCOUNTER — Ambulatory Visit: Attending: Interventional Cardiology

## 2024-07-25 DIAGNOSIS — Z7189 Other specified counseling: Secondary | ICD-10-CM

## 2024-07-25 DIAGNOSIS — Z86711 Personal history of pulmonary embolism: Secondary | ICD-10-CM

## 2024-07-25 DIAGNOSIS — Z7901 Long term (current) use of anticoagulants: Secondary | ICD-10-CM | POA: Diagnosis not present

## 2024-07-25 DIAGNOSIS — I4891 Unspecified atrial fibrillation: Secondary | ICD-10-CM

## 2024-07-25 DIAGNOSIS — Z5181 Encounter for therapeutic drug level monitoring: Secondary | ICD-10-CM | POA: Diagnosis not present

## 2024-07-25 LAB — POCT INR: INR: 2.7 (ref 2.0–3.0)

## 2024-07-25 NOTE — Patient Instructions (Signed)
 Continue taking warfarin 1 tablet daily except for 0.5 tablet on Sundays, Mondays, Wednesdays, and Fridays.  Stay consistent with Boost - 2 times per week. Recheck INR 6 weeks.  Coumadin  Clinic [phone]

## 2024-07-25 NOTE — Progress Notes (Signed)
 INR 2.7 Please see anticoagulation encounter Continue taking warfarin 1 tablet daily except for 0.5 tablet on Sundays, Mondays, Wednesdays, and Fridays.  Stay consistent with Boost - 2 times per week. Recheck INR 6 weeks.  Coumadin  Clinic [phone]

## 2024-08-05 ENCOUNTER — Emergency Department (HOSPITAL_BASED_OUTPATIENT_CLINIC_OR_DEPARTMENT_OTHER)
Admission: EM | Admit: 2024-08-05 | Discharge: 2024-08-06 | Disposition: A | Discharge status: Home / Self Care | Source: Ambulatory Visit

## 2024-08-05 ENCOUNTER — Encounter (HOSPITAL_BASED_OUTPATIENT_CLINIC_OR_DEPARTMENT_OTHER): Payer: Self-pay | Admitting: Urology

## 2024-08-05 ENCOUNTER — Emergency Department (HOSPITAL_BASED_OUTPATIENT_CLINIC_OR_DEPARTMENT_OTHER)

## 2024-08-05 ENCOUNTER — Other Ambulatory Visit: Payer: Self-pay

## 2024-08-05 DIAGNOSIS — R9431 Abnormal electrocardiogram [ECG] [EKG]: Secondary | ICD-10-CM | POA: Insufficient documentation

## 2024-08-05 DIAGNOSIS — K029 Dental caries, unspecified: Secondary | ICD-10-CM | POA: Diagnosis not present

## 2024-08-05 DIAGNOSIS — I517 Cardiomegaly: Secondary | ICD-10-CM | POA: Insufficient documentation

## 2024-08-05 DIAGNOSIS — K0889 Other specified disorders of teeth and supporting structures: Secondary | ICD-10-CM | POA: Insufficient documentation

## 2024-08-05 DIAGNOSIS — R509 Fever, unspecified: Secondary | ICD-10-CM | POA: Diagnosis not present

## 2024-08-05 DIAGNOSIS — Z79899 Other long term (current) drug therapy: Secondary | ICD-10-CM | POA: Diagnosis not present

## 2024-08-05 LAB — RESP PANEL BY RT-PCR (RSV, FLU A&B, COVID)  RVPGX2
Influenza A by PCR: NEGATIVE
Influenza B by PCR: NEGATIVE
Resp Syncytial Virus by PCR: NEGATIVE
SARS Coronavirus 2 by RT PCR: NEGATIVE

## 2024-08-05 LAB — COMPREHENSIVE METABOLIC PANEL WITH GFR
ALT: 13 U/L (ref 0–44)
AST: 27 U/L (ref 15–41)
Albumin: 4.1 g/dL (ref 3.5–5.0)
Alkaline Phosphatase: 66 U/L (ref 38–126)
Anion gap: 11 (ref 5–15)
BUN: 9 mg/dL (ref 8–23)
CO2: 28 mmol/L (ref 22–32)
Calcium: 9.7 mg/dL (ref 8.9–10.3)
Chloride: 100 mmol/L (ref 98–111)
Creatinine, Ser: 0.77 mg/dL (ref 0.44–1.00)
GFR, Estimated: >60 mL/min (ref >60)
Glucose, Bld: 106 mg/dL — ABNORMAL HIGH (ref 70–99)
Potassium: 4 mmol/L (ref 3.5–5.1)
Sodium: 139 mmol/L (ref 135–145)
Total Bilirubin: 1.5 mg/dL — ABNORMAL HIGH (ref 0.0–1.2)
Total Protein: 7.2 g/dL (ref 6.5–8.1)

## 2024-08-05 LAB — URINALYSIS, W/ REFLEX TO CULTURE (INFECTION SUSPECTED)
Bilirubin Urine: NEGATIVE
Glucose, UA: NEGATIVE mg/dL
Hgb urine dipstick: NEGATIVE
Ketones, ur: NEGATIVE mg/dL
Nitrite: NEGATIVE
Specific Gravity, Urine: 1.028 (ref 1.005–1.030)
pH: 6 (ref 5.0–8.0)

## 2024-08-05 LAB — CBC WITH DIFFERENTIAL/PLATELET
Abs Immature Granulocytes: 0.01 K/uL (ref 0.00–0.07)
Basophils Absolute: 0 K/uL (ref 0.0–0.1)
Basophils Relative: 0 %
Eosinophils Absolute: 0 K/uL (ref 0.0–0.5)
Eosinophils Relative: 0 %
HCT: 43.5 % (ref 36.0–46.0)
Hemoglobin: 13.8 g/dL (ref 12.0–15.0)
Immature Granulocytes: 0 %
Lymphocytes Relative: 14 %
Lymphs Abs: 0.7 K/uL (ref 0.7–4.0)
MCH: 31.1 pg (ref 26.0–34.0)
MCHC: 31.7 g/dL (ref 30.0–36.0)
MCV: 98 fL (ref 80.0–100.0)
Monocytes Absolute: 0.4 K/uL (ref 0.1–1.0)
Monocytes Relative: 9 %
Neutro Abs: 4 K/uL (ref 1.7–7.7)
Neutrophils Relative %: 77 %
Platelets: 91 K/uL — ABNORMAL LOW (ref 150–400)
RBC: 4.44 MIL/uL (ref 3.87–5.11)
RDW: 14.5 % (ref 11.5–15.5)
WBC: 5.2 K/uL (ref 4.0–10.5)
nRBC: 0 % (ref 0.0–0.2)

## 2024-08-05 LAB — APTT: aPTT: 51 s — ABNORMAL HIGH (ref 24–36)

## 2024-08-05 LAB — PROTIME-INR
INR: 2.2 — ABNORMAL HIGH (ref 0.8–1.2)
Prothrombin Time: 25.2 s — ABNORMAL HIGH (ref 11.4–15.2)

## 2024-08-05 LAB — LACTIC ACID, PLASMA: Lactic Acid, Venous: 0.9 mmol/L (ref 0.5–1.9)

## 2024-08-05 MED ORDER — SODIUM CHLORIDE 0.9 % IV BOLUS
1000.0000 mL | Freq: Once | INTRAVENOUS | Status: AC
  Administered 2024-08-05: 1000 mL via INTRAVENOUS

## 2024-08-05 MED ORDER — HYDROMORPHONE HCL 1 MG/ML IJ SOLN
0.5000 mg | Freq: Once | INTRAMUSCULAR | Status: AC
  Administered 2024-08-05: 0.5 mg via INTRAVENOUS
  Filled 2024-08-05: qty 1

## 2024-08-05 MED ORDER — IOHEXOL 300 MG/ML  SOLN
75.0000 mL | Freq: Once | INTRAMUSCULAR | Status: AC | PRN
  Administered 2024-08-05: 75 mL via INTRAVENOUS

## 2024-08-05 MED ORDER — SODIUM CHLORIDE 0.9 % IV SOLN
3.0000 g | Freq: Once | INTRAVENOUS | Status: AC
  Administered 2024-08-05: 3 g via INTRAVENOUS

## 2024-08-05 NOTE — ED Notes (Signed)
 Patient transported to CT

## 2024-08-05 NOTE — ED Provider Notes (Addendum)
 [place] EMERGENCY DEPARTMENT AT [facility] Provider Note   CSN: [id number] Arrival date & time: 08/05/24  1738     Patient presents with: Dental Pain   Debbie [name] is a 78 y.o. female.   Is a 78 year old presenting emergency department for evaluation of fevers.  She reports dental pain for some time worsened on Friday.  Has been having fevers for the past 2 days.  Notes pain to the left lower tooth that seemingly now radiating down towards her left ear down left side of neck.  No painful or difficulty swallowing.  Reports fevers at home with temp of 101.3.  No chest pain no shortness of breath.  No dysuria.  No URI symptoms.  No other obvious source of infection that she can recall.   Dental Pain      Prior to Admission medications   Medication Sig Start Date End Date Taking? Authorizing Provider  acetaminophen  (TYLENOL ) 500 MG tablet Take 500 mg by mouth in the morning, at noon, and at bedtime.    [provider]  amiodarone  (PACERONE ) 100 MG tablet TAKE 1 TABLET BY MOUTH DAILY 08/22/24   Lonni Slain, MD  Ascorbic Acid (VITAMIN C PO) Take 282 mg by mouth daily.     [provider]  calcium carbonate (OS-CAL) 600 MG TABS tablet Take 600 mg by mouth daily with breakfast. With vitamin D  800 units    [provider]  Colchicine  0.6 MG CAPS For gout    [provider]  colchicine  0.6 MG tablet Take 0.6 mg by mouth daily. 06/04/24   [provider]  gabapentin  (NEURONTIN ) 600 MG tablet Take 1,500 mg by mouth 3 (three) times daily.    [provider]  HYDROcodone -acetaminophen  (NORCO) 7.5-325 MG tablet Take 1 tablet by mouth 3 (three) times daily as needed for moderate pain. 03/07/17   [provider]  hyoscyamine  (LEVSIN  SL) 0.125 MG SL tablet Place 1 tablet (0.125 mg total) under the tongue every 6 (six) hours as needed for cramping (nausea, diarrhea). 03/03/23   Craig Alan SAUNDERS, PA-C  lactulose  (CHRONULAC) 10 GM/15ML solution Take 10mL by mouth daily as needed for constipation    [provider]  lamoTRIgine  (LAMICTAL ) 25 MG tablet Take 1 tablet (25 mg total) by mouth 2 (two) times daily. 07/06/20   Gayland Lauraine PARAS, NP  LEVALBUTEROL HCL IN Inhale into the lungs daily as needed (for shortness of breath).    [provider]  lidocaine  (LIDODERM ) 5 % Place 1 patch onto the skin as needed.    [provider]  meclizine  (ANTIVERT ) 12.5 MG tablet Take 1 tablet (12.5 mg total) by mouth 2 (two) times daily as needed for dizziness. 01/12/23   Cherlyn Labella, MD  nystatin  (MYCOSTATIN /NYSTOP ) powder Apply 1 Application topically 3 (three) times daily. 01/16/23   [provider]  ondansetron  (ZOFRAN ) 4 MG tablet Take 4 mg by mouth as needed. 01/08/23   [provider]  polyethylene glycol (MIRALAX  / GLYCOLAX ) packet Take 4.25 g by mouth daily. Mix 1/4 capful with beverage of choice and drink daily    [provider]  promethazine  (PHENERGAN ) 25 MG tablet Take 6.25 mg by mouth every 6 (six) hours as needed for nausea or vomiting.    [provider]  sertraline (ZOLOFT) 25 MG tablet Take 25 mg by mouth daily. 05/26/23   [provider]  triamcinolone  cream (KENALOG ) 0.1 % Apply 1 Application topically as needed. 04/20/21  [provider]  vitamin B-12 (CYANOCOBALAMIN) 1000 MCG tablet Take 1,000 mcg by mouth daily.    [provider]  Vitamin D , Ergocalciferol , (DRISDOL ) 1.25 MG (50000 UNIT) CAPS capsule TAKE 1 CAPSULE BY MOUTH EVERY 10 DAYS 09/28/20   Jordan, [name], MD  warfarin (COUMADIN ) 5 MG tablet TAKE A HALF TO 1 TABLET BY MOUTH DAILY AS DIRECTED BY ANTICOAGULATION CLINIC 06/03/24   Lonni Slain, MD    Allergies: Diclofenac , Atorvastatin, Tape, Albuterol , Lasix  [furosemide ], Mushroom extract complex (obsolete), Tramadol , Buprenorphine hcl, Codeine, Fish oil, and Morphine and codeine    Review of  Systems  Updated Vital Signs BP 130/79   Pulse 82   Temp 98.7 F (37.1 C)   Resp 18   Ht 5' 9 (1.753 m)   Wt 106.1 kg   SpO2 98%   BMI 34.54 kg/m   Physical Exam Vitals and nursing note reviewed.  Constitutional:      General: She is not in acute distress.    Appearance: She is obese. She is not toxic-appearing.  HENT:     Head: Normocephalic.     Nose: Nose normal.     Mouth/Throat:     Mouth: Mucous membranes are moist.     Comments: Poor dentition.  No obvious periapical abscess.  Floor of mouth is soft.  No angioedema.  Uvula is midline.  No trismus Eyes:     Conjunctiva/sclera: Conjunctivae normal.  Cardiovascular:     Rate and Rhythm: Normal rate and regular rhythm.  Pulmonary:     Effort: Pulmonary effort is normal.     Breath sounds: Normal breath sounds.  Abdominal:     General: Abdomen is flat. There is no distension.     Palpations: Abdomen is soft.     Tenderness: There is no abdominal tenderness. There is no guarding or rebound.  Musculoskeletal:     Cervical back: Neck supple. No tenderness.  Skin:    General: Skin is warm and dry.     Capillary Refill: Capillary refill takes less than 2 seconds.  Neurological:     Mental Status: She is alert and oriented to person, place, and time.  Psychiatric:        Mood and Affect: Mood normal.        Behavior: Behavior normal.     (all labs ordered are listed, but only abnormal results are displayed) Labs Reviewed  COMPREHENSIVE METABOLIC PANEL WITH GFR - Abnormal; Notable for the following components:      Result Value   Glucose, Bld 106 (*)    Total Bilirubin 1.5 (*)    All other components within normal limits  CBC WITH DIFFERENTIAL/PLATELET - Abnormal; Notable for the following components:   Platelets 91 (*)    All other components within normal limits  PROTIME-INR - Abnormal; Notable for the following components:   Prothrombin Time 25.2 (*)    INR 2.2 (*)    All other components within normal  limits  APTT - Abnormal; Notable for the following components:   aPTT 51 (*)    All other components within normal limits  URINALYSIS, W/ REFLEX TO CULTURE (INFECTION SUSPECTED) - Abnormal; Notable for the following components:   Protein, ur TRACE (*)    Leukocytes,Ua MODERATE (*)    Bacteria, UA RARE (*)    All other components within normal limits  CULTURE, BLOOD (ROUTINE X 2)  CULTURE, BLOOD (ROUTINE X 2)  RESP PANEL BY RT-PCR (RSV, FLU A&B, COVID)  [id number]  LACTIC ACID, PLASMA    EKG: EKG Interpretation Date/Time:  Monday August 05 2024 21:46:32 EDT Ventricular Rate:  86 PR Interval:  249 QRS Duration:  95 QT Interval:  432 QTC Calculation: 517 R Axis:   -33  Text Interpretation: Sinus rhythm Prolonged PR interval Left axis deviation Low voltage, precordial leads Abnormal R-wave progression, late transition Borderline T abnormalities, anterior leads Prolonged QT interval Confirmed by Franklyn Gills [phone]) on 08/06/2024 1:43:02 PM  Radiology: No results found.    Procedures   Medications Ordered in the ED  HYDROmorphone  (DILAUDID ) injection 0.5 mg (0.5 mg Intravenous Given 08/05/24 2151)  Ampicillin-Sulbactam (UNASYN) 3 g in sodium chloride  0.9 % 100 mL IVPB (0 g Intravenous Stopped 08/05/24 2254)  sodium chloride  0.9 % bolus 1,000 mL (0 mLs Intravenous Stopped 08/05/24 2254)  iohexol  (OMNIPAQUE ) 300 MG/ML solution 75 mL (75 mLs Intravenous Contrast Given 08/05/24 2306)    Clinical Course as of 09/19/24 2307  Mon Aug 05, 2024  2250 Normal EF in 2020 per my chart review [TY]  2254 DG Chest Port 1 View MPRESSION: 1. Chronic coarsened interstitial markings and lower lung airspace opacities, suggestive of pulmonary edema. 2. Mild cardiomegaly.  Electronically signed by: Norman Gatlin MD 08/05/2024 10:11 PM EDT RP Workstation: [id number]   [TY]  2255 Lactic Acid, Venous: 0.9 Reassuring [TY]  2255 Urinalysis, w/ Reflex to Culture (Infection Suspected) -Urine, Clean  Catch(!) Not evidence of UTI [TY]  2255 Protime-INR(!) Consistent with warfarin use [TY]  2256 Comprehensive metabolic panel(!) No metabolic derangements.  Normal kidney function.  Minor elevation in T. bili, no abdominal pain or right upper quadrant tenderness [TY]  Tue Aug 06, 2024  0001 CT Soft Tissue Neck W Contrast IMPRESSION: 1. Subtle induration within the subcutaneous fat of the left lower face adjacent to the left mandibular body, with mild thickening of the left platysmas muscle. Findings raise the possibility for early and/or developing infection/cellulitis. Few scattered underlying dental caries noted, suggesting a possible odontogenic origin. No discrete abscess or drainable fluid collection seen at this time. 2. No other acute abnormality within the neck. 3. 5 mm hypodense lesion at the vallecular aspect of the right epiglottis, nonspecific, but suspected to reflect a small retention cyst. Correlation with physical exam/direct inspection suggested.  Aortic Atherosclerosis (ICD10-I70.0).   Electronically Signed   By: Morene Hoard M.D.   On: 08/05/2024 23:49   [TY]  2332 Patient overall well-appearing, does not appear to have systemic infection.  CT scan without acute abscess.  Discussed need for follow-up with dentist.  Will discharge with antibiotics. [TY]    Clinical Course User Index [TY] Neysa Caron PARAS, DO                                 Medical Decision Making This is a 78 year old female with complex past medical history to include hypertension, A-fib on warfarin fibromyalgia, obesity, CKD presenting emergency department for evaluation of fevers and her dental pain.  She is febrile here 100.5, but nontachycardic and normotensive.  Borderline oxygen saturation which she states is normal for her.  She does take chronic opioids for her fibromyalgia which she states are not helping with her dental pain.  Therefore Dilaudid  ordered for her severe pain.   Concern for possible systemic infection given her fever and possible dental infection.  Clinically she is manage her secretions no angioedema. Will get CT neck to evaluate for deep space  infection.  Will give IV fluids.  Will also give a dose of Unasyn while awaiting workup.  See course for further MDM disposition  Amount and/or Complexity of Data Reviewed Independent Historian:     Details: For member notes fevers 101. External Data Reviewed:     Details: Family med notes on warfarin Labs: ordered. Decision-making details documented in ED Course. Radiology: ordered and independent interpretation performed. Decision-making details documented in ED Course.  Risk Prescription drug management.   Full coags to evaluated for DIC; negative.     Final diagnoses:  Pain, dental    ED Discharge Orders          Ordered    amoxicillin -clavulanate (AUGMENTIN ) 875-125 MG tablet  Every 12 hours,   Status:  Discontinued        08/06/24 0020               Neysa Caron PARAS, DO 08/06/24 2333    Neysa Caron PARAS, DO 09/19/24 2307

## 2024-08-05 NOTE — ED Triage Notes (Signed)
 Pt states left sided toothache since Friday, was seen at pain dr on Sunday and was advised to come to ER  States pain now radiating to left ear and left side of head  States fever at home as well  Tylenol  last at 1500 temp at home was 101.3

## 2024-08-05 NOTE — ED Notes (Signed)
 PT spo2 88% after pain meds, 2LNC applied

## 2024-08-06 MED ORDER — AMOXICILLIN-POT CLAVULANATE 875-125 MG PO TABS: 1.0000 | ORAL_TABLET | Freq: Two times a day (BID) | ORAL | 0 refills | Status: DC

## 2024-08-06 NOTE — Discharge Instructions (Addendum)
 Take the antibiotics we have prescribed you for the full course.  Follow-up with your dentist.  Return if you develop fevers, chills, difficulty swallowing, shortness of breath or any new or worsening symptoms that are concerning to you

## 2024-08-07 ENCOUNTER — Telehealth: Payer: Self-pay | Admitting: Pharmacist

## 2024-08-07 NOTE — Telephone Encounter (Signed)
 Patient left VM. Went to ER last night and prescribed Augmentin .  Called patient back. No answer, lmom

## 2024-08-07 NOTE — Telephone Encounter (Signed)
 Spoke with patient and advised that the Augmentin  is safe to take warfarin and that she should take with food. She states she is glad to know she can take it.

## 2024-08-11 LAB — CULTURE, BLOOD (ROUTINE X 2)
Culture: NO GROWTH
Culture: NO GROWTH
Special Requests: ADEQUATE
Special Requests: ADEQUATE

## 2024-08-21 ENCOUNTER — Telehealth: Payer: Self-pay

## 2024-08-21 NOTE — Telephone Encounter (Signed)
 Pharmacy please advise on holding coumadin  prior to dental extraction of 5 teeth scheduled for TBD. Last labs on 08/05/2024. Thank you.

## 2024-08-21 NOTE — Telephone Encounter (Signed)
   Pre-operative Risk Assessment    Patient Name: Debbie [name]  DOB: July 21, 1946 MRN: [id number]   Date of last office visit: 07/03/23 SHELDA BRUCKNER, MD Date of next office visit: NONE  Request for Surgical Clearance    Procedure:  Dental Extraction - Amount of Teeth to be Pulled:  5 TEETH  Date of Surgery:  Clearance TBD                                Surgeon:  DR DEWARD CONGRESS ORAL AND MAXILLOFACIAL SURGEON Surgeon's Group or Practice Name:  [facility] Phone number:  [phone] Fax number:  [phone]   Type of Clearance Requested:   - Medical  - Pharmacy:  Hold Warfarin (Coumadin )     Type of Anesthesia:  N2O / LA   Additional requests/questions:    Signed, Lucie DELENA Ku   08/21/2024, 10:05 AM

## 2024-08-22 ENCOUNTER — Other Ambulatory Visit: Payer: Self-pay | Admitting: Cardiology

## 2024-08-22 DIAGNOSIS — I480 Paroxysmal atrial fibrillation: Secondary | ICD-10-CM

## 2024-08-26 ENCOUNTER — Telehealth (HOSPITAL_BASED_OUTPATIENT_CLINIC_OR_DEPARTMENT_OTHER): Payer: Self-pay | Admitting: Cardiology

## 2024-08-26 NOTE — Telephone Encounter (Signed)
 Patient is following up. She says they will not do her extractions because she has been in afib for the past month. She has tried resting and breathing exercises but nothing seems to help. HR is finally down to 73 bpm which is the best it has been in 19 days but she is still in afib. She would like to know if she is still able to be cleared. Please advise.

## 2024-08-26 NOTE — Telephone Encounter (Signed)
 Patient c/o Palpitations:  STAT if patient reporting lightheadedness, shortness of breath, or chest pain  How long have you had palpitations/irregular HR/ Afib? Are you having the symptoms now?   Yes  Are you currently experiencing lightheadedness, SOB or CP?   No  Do you have a history of afib (atrial fibrillation) or irregular heart rhythm?   Yes  Have you checked your BP or HR? (document readings if available):   BP 102/53    HR 73 - this morning  Are you experiencing any other symptoms?   Nausea, headache  Patient stated her HR reading has been around 80 for some time and wants advice on how to lower her HR so she can have her teeth extracted.

## 2024-08-26 NOTE — Telephone Encounter (Signed)
 Spoke with patient who wants clearance for tooth extractions however she is back in atrial fib Stated she does not feel well when in Afib and would like to know how she can get out of it Offered a couple of appointments including tomorrow however she does not drive and her daughter has to work  She has to go for INR on 10/30 and she asked to be seen on that day Scheduled her visit with Fairy RAMAN PA in Afib clinic

## 2024-08-27 NOTE — Telephone Encounter (Signed)
 I will forward these notes to the preop APP to see notes from about clearance.   I will also forward to triage to reach out to pt about a-fib.

## 2024-08-27 NOTE — Telephone Encounter (Signed)
 Fredirick Newell NOVAK, LPN   89/79/74  5:56 PM Note Spoke with patient who wants clearance for tooth extractions however she is back in atrial fib Stated she does not feel well when in Afib and would like to know how she can get out of it Offered a couple of appointments including tomorrow however she does not drive and her daughter has to work  She has to go for INR on 10/30 and she asked to be seen on that day Scheduled her visit with Fairy RAMAN PA in Afib clinic    ____________________________________________________

## 2024-08-29 NOTE — Telephone Encounter (Signed)
 Will hold off on commenting on hold until plan is finalized due to afib

## 2024-09-05 ENCOUNTER — Ambulatory Visit
Admission: RE | Admit: 2024-09-05 | Discharge: 2024-09-05 | Disposition: A | Discharge status: Home / Self Care | Source: Ambulatory Visit | Attending: Internal Medicine | Admitting: Internal Medicine

## 2024-09-05 ENCOUNTER — Ambulatory Visit (INDEPENDENT_AMBULATORY_CARE_PROVIDER_SITE_OTHER)

## 2024-09-05 VITALS — BP 112/64 | HR 87 | Ht 69.0 in | Wt 229.6 lb

## 2024-09-05 DIAGNOSIS — Z79899 Other long term (current) drug therapy: Secondary | ICD-10-CM | POA: Diagnosis not present

## 2024-09-05 DIAGNOSIS — I4819 Other persistent atrial fibrillation: Secondary | ICD-10-CM

## 2024-09-05 DIAGNOSIS — I4891 Unspecified atrial fibrillation: Secondary | ICD-10-CM | POA: Diagnosis not present

## 2024-09-05 DIAGNOSIS — Z5181 Encounter for therapeutic drug level monitoring: Secondary | ICD-10-CM

## 2024-09-05 DIAGNOSIS — Z7189 Other specified counseling: Secondary | ICD-10-CM

## 2024-09-05 DIAGNOSIS — D6869 Other thrombophilia: Secondary | ICD-10-CM

## 2024-09-05 DIAGNOSIS — Z86711 Personal history of pulmonary embolism: Secondary | ICD-10-CM

## 2024-09-05 DIAGNOSIS — Z7901 Long term (current) use of anticoagulants: Secondary | ICD-10-CM

## 2024-09-05 LAB — POCT INR: INR: 2.9 (ref 2.0–3.0)

## 2024-09-05 NOTE — Patient Instructions (Signed)
 Continue taking warfarin 1 tablet daily except for 0.5 tablet on Sundays, Mondays, Wednesdays, and Fridays. Dental Extractions TBD Stay consistent with Boost - 2 times per week. Recheck INR 6 weeks.  Coumadin  Clinic [phone]

## 2024-09-05 NOTE — Telephone Encounter (Signed)
 Clearance faxed to requesting provider.

## 2024-09-05 NOTE — Addendum Note (Signed)
 Encounter addended by: Nellene Quita SAUNDERS, PA on: 09/05/2024 1:26 PM  Actions taken: Clinical Note Signed

## 2024-09-05 NOTE — Progress Notes (Signed)
 INR 2.9 Please see anticoagulation encounter Continue taking warfarin 1 tablet daily except for 0.5 tablet on Sundays, Mondays, Wednesdays, and Fridays. Dental Extractions TBD Stay consistent with Boost - 2 times per week. Recheck INR 6 weeks.  Coumadin  Clinic [phone]

## 2024-09-05 NOTE — Progress Notes (Addendum)
 Primary Care Physician: Ileen Rosaline NOVAK, NP Primary Cardiologist: Shelda Bruckner, MD Electrophysiologist: None  Referring Physician: [facility] Triage    Debbie [name] is a 78 y.o. female with a history of HLD, PE, CKD, HTN, OSA, atrial fibrillation who presents for follow up in the [facility].  The patient was initially diagnosed with atrial fibrillation remotely and is s/p cyro ablation in 2017 by Dr Meldon. She has been maintained on amiodarone . Patient is on warfarin for stroke prevention.    Patient presents today for follow up for atrial fibrillation. Patient reports that she has been in afib for about one month. Her heart rates have been in the 80-90s with symptoms of weakness and palpitations. She was seen at the ED 08/05/24 with a dental infection and was started on antibiotics. She is pending dental extractions with her dental team.   Today, she denies symptoms of chest pain, shortness of breath, orthopnea, PND, lower extremity edema, dizziness, presyncope, syncope, snoring, daytime somnolence, bleeding, or neurologic sequela. The patient is tolerating medications without difficulties and is otherwise without complaint today.    Atrial Fibrillation Risk Factors:  she does have symptoms or diagnosis of sleep apnea. she does not have a history of rheumatic fever.   Atrial Fibrillation Management history:  Previous antiarrhythmic drugs: amiodarone   Previous cardioversions: none Previous ablations: 08/17/16 (cryo Dr Meldon) Anticoagulation history: warfarin  ROS- All systems are reviewed and negative except as per the HPI above.  Past Medical History:  Diagnosis Date   Arthritis    Atrial fibrillation (HCC)    Benign essential tremor    Breast cancer (HCC)    Cataract    Bilateral 2018   Chronic renal insufficiency    Common migraine 05/07/2015   Complication of anesthesia    Depression with anxiety    Drowsiness     Excessive daytime   Dyslipidemia    Fibromyalgia    History of partial seizures    Hypertension    Memory disturbance    Muscle tension headache    Obesity    Obstructive sleep apnea    PONV (postoperative nausea and vomiting)    S/P epidural steroid injection    Sacral contusion 07/2018   Seizures (HCC)    Partial   Sleep apnea    does not wear CPAP   Sprain of neck 06/26/2013   Tremors of nervous system    Vitamin B12 deficiency     Current Outpatient Medications  Medication Sig Dispense Refill   acetaminophen  (TYLENOL ) 500 MG tablet Take 500 mg by mouth in the morning, at noon, and at bedtime.     amiodarone  (PACERONE ) 100 MG tablet TAKE 1 TABLET BY MOUTH DAILY 30 tablet 3   Ascorbic Acid (VITAMIN C PO) Take 282 mg by mouth daily.      calcium carbonate (OS-CAL) 600 MG TABS tablet Take 600 mg by mouth daily with breakfast. With vitamin D  800 units     Colchicine  0.6 MG CAPS For gout     colchicine  0.6 MG tablet Take 0.6 mg by mouth daily.     gabapentin  (NEURONTIN ) 600 MG tablet Take 1,500 mg by mouth 3 (three) times daily.     HYDROcodone -acetaminophen  (NORCO) 7.5-325 MG tablet Take 1 tablet by mouth 3 (three) times daily as needed for moderate pain.     hyoscyamine  (LEVSIN  SL) 0.125 MG SL tablet Place 1 tablet (0.125 mg total) under the tongue every 6 (six) hours as needed  for cramping (nausea, diarrhea). 50 tablet 1   lactulose (CHRONULAC) 10 GM/15ML solution Take 10mL by mouth daily as needed for constipation     lamoTRIgine  (LAMICTAL ) 25 MG tablet Take 1 tablet (25 mg total) by mouth 2 (two) times daily. 180 tablet 3   LEVALBUTEROL HCL IN Inhale into the lungs daily as needed (for shortness of breath).     lidocaine  (LIDODERM ) 5 % Place 1 patch onto the skin as needed.     meclizine  (ANTIVERT ) 12.5 MG tablet Take 1 tablet (12.5 mg total) by mouth 2 (two) times daily as needed for dizziness. 30 tablet 0   nystatin  (MYCOSTATIN /NYSTOP ) powder Apply 1 Application topically 3  (three) times daily.     ondansetron  (ZOFRAN ) 4 MG tablet Take 4 mg by mouth as needed.     polyethylene glycol (MIRALAX  / GLYCOLAX ) packet Take 4.25 g by mouth daily. Mix 1/4 capful with beverage of choice and drink daily     promethazine  (PHENERGAN ) 25 MG tablet Take 6.25 mg by mouth every 6 (six) hours as needed for nausea or vomiting.     sertraline (ZOLOFT) 25 MG tablet Take 25 mg by mouth daily.     triamcinolone  cream (KENALOG ) 0.1 % Apply 1 Application topically as needed.     vitamin B-12 (CYANOCOBALAMIN) 1000 MCG tablet Take 1,000 mcg by mouth daily.     Vitamin D , Ergocalciferol , (DRISDOL ) 1.25 MG (50000 UNIT) CAPS capsule TAKE 1 CAPSULE BY MOUTH EVERY 10 DAYS 9 capsule 3   warfarin (COUMADIN ) 5 MG tablet TAKE A HALF TO 1 TABLET BY MOUTH DAILY AS DIRECTED BY ANTICOAGULATION CLINIC 75 tablet 1   No current facility-administered medications for this encounter.    Physical Exam: BP 112/64   Pulse 87   Ht 5' 9 (1.753 m)   Wt 104.1 kg   BMI 33.91 kg/m   GEN: Well nourished, well developed in no acute distress CARDIAC: Irregularly irregular rate and rhythm, no murmurs, rubs, gallops RESPIRATORY:  Clear to auscultation without rales, wheezing or rhonchi  ABDOMEN: Soft, non-tender, non-distended EXTREMITIES:  No edema; No deformity   Wt Readings from Last 3 Encounters:  09/05/24 104.1 kg  08/05/24 106.1 kg  06/17/24 106.1 kg     EKG today demonstrates  Afib Vent. rate 87 BPM PR interval * ms QRS duration 84 ms QT/QTcB 380/457 ms   Echo 03/05/19 demonstrated   1. The left ventricle has normal systolic function, with an ejection  fraction of 55-60%. The cavity size was normal. Left ventricular diastolic  function could not be evaluated secondary to atrial fibrillation.   2. The entire echo was extremely difficult and the images are suboptimal.   3. The right ventricle has normal systolic function. The cavity was  normal. There is no increase in right ventricular wall  thickness.   4. Right atrial size was mildly dilated.   5. Trivial pericardial effusion is present.   6. The mitral valve is grossly normal.   7. The aortic valve was not assessed.   8. The interatrial septum was not assessed.    CHA2DS2-VASc Score = 5  The patient's score is based upon: CHF History: 0 HTN History: 1 Diabetes History: 0 Stroke History: 0 Vascular Disease History: 1 (aortic atherosclerosis) Age Score: 2 Gender Score: 1       ASSESSMENT AND PLAN: Persistent Atrial Fibrillation (ICD10:  I48.19) The patient's CHA2DS2-VASc score is 5, indicating a 7.2% annual risk of stroke.   S/p cryo ablation 2017  Patient in rate controlled afib, symptomatic. We discussed rhythm control options including DCCV +/- TEE. She would prefer to do her dental procedure first if possible. Will review with her primary cardiologist. Otherwise, she would not be able to hold warfarin for 4 weeks prior to DCCV or 4 weeks after. She would like to discuss with her family before making any decisions.  Continue amiodarone  100 mg daily  Continue warfarin  Addendum: Reviewed with Dr Lonni. OK to proceed with holding warfarin and dental extraction in rate controlled afib. Plan for rhythm control after back on warfarin. Will route to pre op pool.   Secondary Hypercoagulable State (ICD10:  D68.69) The patient is at significant risk for stroke/thromboembolism based upon her CHA2DS2-VASc Score of 5.  Continue Warfarin (Coumadin ). No bleeding issues.   High Risk Medication Monitoring (ICD 10: J342684) Patient requires ongoing monitoring for anti-arrhythmic medication which has the potential to cause life threatening arrhythmias. Intervals on ECG acceptable for amiodarone  monitoring. Recent liver and thyroid  labs reviewed.    OSA  Patient not currently on CPAP.   Follow up in the AF clinic pending decision regarding DCCV.    Informed Consent   Shared Decision Making/Informed Consent The risks  (stroke, cardiac arrhythmias rarely resulting in the need for a temporary or permanent pacemaker, skin irritation or burns and complications associated with conscious sedation including aspiration, arrhythmia, respiratory failure and death), benefits (restoration of normal sinus rhythm) and alternatives of a direct current cardioversion were explained in detail to Ms. Falzone and she agrees to proceed.       [facility] [address] [place], [place] [address] [phone]

## 2024-09-08 NOTE — Telephone Encounter (Signed)
 Patient with diagnosis of atrial fibrillation on warfarin for anticoagulation.    Procedure:  Dental Extraction - Amount of Teeth to be Pulled:  5 TEETH   Date of Surgery:  Clearance TBD   CHA2DS2-VASc Score = 5   This indicates a 7.2% annual risk of stroke. The patient's score is based upon: CHF History: 0 HTN History: 1 Diabetes History: 0 Stroke History: 0 Vascular Disease History: 1 (aortic atherosclerosis) Age Score: 2 Gender Score: 1   Patient also has history of PE 2017  CrCl 101 Platelet count 91  Patient has not had an Afib/aflutter ablation in the last 3 months, DCCV within the last 4 weeks or a watchman implanted in the last 45 days   Patient does not require pre-op antibiotics for dental procedure.  Per office protocol, patient can hold warfarin for 5 days prior to procedure.   Patient will not need bridging with Lovenox (enoxaparin) around procedure.  **This guidance is not considered finalized until pre-operative APP has relayed final recommendations.**

## 2024-09-09 ENCOUNTER — Telehealth (HOSPITAL_BASED_OUTPATIENT_CLINIC_OR_DEPARTMENT_OTHER): Payer: Self-pay

## 2024-09-09 NOTE — Telephone Encounter (Signed)
   Pre-operative Risk Assessment    Patient Name: Debbie [name]  DOB: 1946-02-26 MRN: [id number]   Date of last office visit: 07/03/23 with Dr. Lonni Date of next office visit: NA  Request for Surgical Clearance    Procedure:  Dental Extraction - Amount of Teeth to be Pulled:  5 - surgical  Date of Surgery:  Clearance TBD                                 Surgeon:  Dr. Deward Congress Surgeon's Group or Practice Name:  [facility] Phone number:  [phone] Fax number:  [phone]   Type of Clearance Requested:   - Medical  - Pharmacy:  Hold Warfarin (Coumadin ) not indicated   Type of Anesthesia:  local and nitrous oxide   Additional requests/questions:    [name]   09/09/2024, 3:37 PM

## 2024-09-09 NOTE — Telephone Encounter (Signed)
     Primary Cardiologist: Shelda Bruckner, MD  Chart reviewed as part of pre-operative protocol coverage. Given past medical history and time since last visit, based on ACC/AHA guidelines, PARKER SAWATZKY would be at acceptable risk for the planned procedure without further cardiovascular testing.   Patient has not had an Afib/aflutter ablation in the last 3 months, DCCV within the last 4 weeks or a watchman implanted in the last 45 days    Patient does not require pre-op antibiotics for dental procedure.   Per office protocol, patient can hold warfarin for 5 days prior to procedure.   Patient will not need bridging with Lovenox (enoxaparin) around procedure.  I will route this recommendation to the requesting party via Epic fax function and remove from pre-op pool.  Josefa HERO. Haliey Romberg NP-C     09/09/2024, 8:42 AM [facility] [address] [place], [place] [address] Office [phone]

## 2024-09-09 NOTE — Telephone Encounter (Signed)
 Duplicate request. See clearance provided 09/09/2024.

## 2024-09-10 NOTE — Telephone Encounter (Signed)
 Requesting office has sent another request same procedure. Pt was cleared by Josefa Beauvais, FNP 09/09/24: no need for SBE?ABX  Recommendations to hold Warfarin noted in his notes   I will re-fax the notes again to requesting office.

## 2024-09-16 ENCOUNTER — Telehealth: Payer: Self-pay | Admitting: Pharmacist

## 2024-09-16 NOTE — Telephone Encounter (Signed)
 Patient called, dentist has prescribed Valium before dental procedure on Thursday. Advised it should not interact with her warfarin

## 2024-09-18 NOTE — Telephone Encounter (Signed)
 Dental office calling to FU please advise

## 2024-09-18 NOTE — Telephone Encounter (Signed)
 Resent clearance to requesting office to fax number provided

## 2024-09-26 ENCOUNTER — Telehealth: Admitting: Neurology

## 2024-10-11 ENCOUNTER — Ambulatory Visit (HOSPITAL_COMMUNITY): Admitting: Physician Assistant

## 2024-10-15 ENCOUNTER — Telehealth: Admitting: Neurology

## 2024-10-15 DIAGNOSIS — R4189 Other symptoms and signs involving cognitive functions and awareness: Secondary | ICD-10-CM | POA: Diagnosis not present

## 2024-10-15 DIAGNOSIS — Z9889 Other specified postprocedural states: Secondary | ICD-10-CM | POA: Diagnosis not present

## 2024-10-15 DIAGNOSIS — F418 Other specified anxiety disorders: Secondary | ICD-10-CM | POA: Diagnosis not present

## 2024-10-15 DIAGNOSIS — R251 Tremor, unspecified: Secondary | ICD-10-CM

## 2024-10-15 DIAGNOSIS — R269 Unspecified abnormalities of gait and mobility: Secondary | ICD-10-CM

## 2024-10-15 DIAGNOSIS — Z8603 Personal history of neoplasm of uncertain behavior: Secondary | ICD-10-CM | POA: Diagnosis not present

## 2024-10-15 MED ORDER — LAMOTRIGINE 25 MG PO TABS: 25.0000 mg | ORAL_TABLET | Freq: Two times a day (BID) | ORAL | 3 refills | Status: AC

## 2024-10-15 NOTE — Progress Notes (Signed)
 ASSESSMENT AND PLAN  Debbie [name] is a 78 y.o. female   Cognitive impairment Depression anxiety  MOCA 23/30 in August 2025  Strong family history of dementia  Laboratory evaluations showed no treatable etiology for memory loss, ApoE3/E3, ATN was ordered, not done, will redo  Encouraged her moderate exercise,  She has depression anxiety, not well-controlled, tried to Zoloft, could not tolerated, will increase her lamotrigine  to 25 mg 2 tablets twice a day, she will call for progress report in 2 to 3 months History of meningioma resection, History of probable partial seizure  MRI of the brain with without contrast in August 2025 showed postoperative change of right parietal region, mild encephalomalacia of the right parietal lobe, small vessel disease,  EEG was normal in August 2025  Encouraged her moderate exercise  Return in 1 year DIAGNOSTIC DATA (LABS, IMAGING, TESTING) - I reviewed patient records, labs, notes, testing and imaging myself where available.   MEDICAL HISTORY:  Debbie [name] is a 78 year old female, seen in request by the primary care nurse practitioner Debbie [name] for evaluation of cognitive impairment, history of meningioma resection, partial seizure, initial evaluation June 17, 2024   History is obtained from the patient and review of electronic medical records. I personally reviewed pertinent available imaging films in PACS.   PMHx of  A fib Depression anxiety,  Brain surgery at [facility] in 2005, benign meningioma, presented with headaches, shaking, Hx of seizure, in 2019,  Hx of breast cancer,  s/p double mastectomy, found a lump in 1980s,   She lives at home with her husband and daughter's family, she retired from medical office as geophysicist/field seismologist, father and sister suffer dementia  She had a history of meningioma, presenting with headache, seizure, had  meningioma resection surgery in 2005, also had a history of seizure, most recent one  was in 2019, seizure started as left foot numbness, spreading to left leg, sometimes causing weakness, confusion, fall,  She was seen by Debbie [name] and Debbie [name] in 2020 for episode of dizziness, confusion, sudden onset left hearing loss,  CT head showed previous right parietal craniotomy with mild underlying encephalomalacia, CT cervical showed multilevel degenerative changes, She has been on low-dose lamotrigine  25 mg twice a day for those episode for a long time, also taking low-dose of sertraline for depression anxiety  She  is mostly sedentary, has worsening anxiety, no longer feel confident driving, sleeps a lot, have TV on all day,  Virtual Visit via video UPDATE Dec 9th 2025 I discussed the limitations of evaluation and management by telemedicine and the availability of in person appointments. The patient expressed understanding and agreed to proceed  Location: Provider: [facility] office; Patient: Home  I connected with Debbie [name]  on Dec 9th 2025 by a video enabled telemedicine application and verified that I am speaking with the correct person using two identifiers.  UPDATED HiSTORY She is with her husband and daughter at today's visit, no recurrent seizure-like spells, shared MRI films with her from August 2025, mild small vessel disease, postoperative change at the right parietal region, mild encephalomalacia  She tried to Zoloft 25 mg for her depression, could not tolerated, she has worsening depression anxiety due to her sister was recently put into nursing home from worsening dementia, her son is her power of attorney, she is on lamotrigine  25 mg twice a day, seems to help her mood, will increase to 2 tablets twice a day,  Reviewed her laboratory evaluation no  treatable etiology for her memory loss found, APO E3/E3, ATM was ordered, not performed   Observations/Objective: I have reviewed problem lists, medications, allergies. Awake, alert, oriented to history taking and casual  conversation, fragile depressed looking elderly female, facial symmetric, moving arm without difficulties gait was deferred  REVIEW OF SYSTEMS:  Full 14 system review of systems performed and notable only for as above All other review of systems were negative.   ALLERGIES: Allergies  Allergen Reactions   Diclofenac  Nausea Only   Atorvastatin Other (See Comments)    Muscle pain   Tape Other (See Comments)    Peels skin off (bandaids, too) unknown   Albuterol  Other (See Comments)    Afib   Lasix  [Furosemide ]     Dizzy nausea   Mushroom Extract Complex (Obsolete) Nausea And Vomiting   Tramadol  Other (See Comments) and Nausea And Vomiting   Buprenorphine Hcl Nausea And Vomiting   Codeine Nausea And Vomiting   Fish Oil Nausea And Vomiting   Morphine And Codeine Nausea And Vomiting    HOME MEDICATIONS: Current Outpatient Medications  Medication Sig Dispense Refill   acetaminophen  (TYLENOL ) 500 MG tablet Take 500 mg by mouth in the morning, at noon, and at bedtime.     amiodarone  (PACERONE ) 100 MG tablet TAKE 1 TABLET BY MOUTH DAILY 30 tablet 3   Ascorbic Acid (VITAMIN C PO) Take 282 mg by mouth daily.      calcium carbonate (OS-CAL) 600 MG TABS tablet Take 600 mg by mouth daily with breakfast. With vitamin D  800 units     Colchicine  0.6 MG CAPS For gout     colchicine  0.6 MG tablet Take 0.6 mg by mouth daily.     gabapentin  (NEURONTIN ) 600 MG tablet Take 1,500 mg by mouth 3 (three) times daily.     HYDROcodone -acetaminophen  (NORCO) 7.5-325 MG tablet Take 1 tablet by mouth 3 (three) times daily as needed for moderate pain.     hyoscyamine  (LEVSIN  SL) 0.125 MG SL tablet Place 1 tablet (0.125 mg total) under the tongue every 6 (six) hours as needed for cramping (nausea, diarrhea). 50 tablet 1   lactulose (CHRONULAC) 10 GM/15ML solution Take 10mL by mouth daily as needed for constipation     lamoTRIgine  (LAMICTAL ) 25 MG tablet Take 1 tablet (25 mg total) by mouth 2 (two) times daily.  180 tablet 3   LEVALBUTEROL HCL IN Inhale into the lungs daily as needed (for shortness of breath).     lidocaine  (LIDODERM ) 5 % Place 1 patch onto the skin as needed.     meclizine  (ANTIVERT ) 12.5 MG tablet Take 1 tablet (12.5 mg total) by mouth 2 (two) times daily as needed for dizziness. 30 tablet 0   nystatin  (MYCOSTATIN /NYSTOP ) powder Apply 1 Application topically 3 (three) times daily.     ondansetron  (ZOFRAN ) 4 MG tablet Take 4 mg by mouth as needed.     polyethylene glycol (MIRALAX  / GLYCOLAX ) packet Take 4.25 g by mouth daily. Mix 1/4 capful with beverage of choice and drink daily     promethazine  (PHENERGAN ) 25 MG tablet Take 6.25 mg by mouth every 6 (six) hours as needed for nausea or vomiting.     sertraline (ZOLOFT) 25 MG tablet Take 25 mg by mouth daily.     triamcinolone  cream (KENALOG ) 0.1 % Apply 1 Application topically as needed.     vitamin B-12 (CYANOCOBALAMIN) 1000 MCG tablet Take 1,000 mcg by mouth daily.     Vitamin D , Ergocalciferol , (DRISDOL ) 1.25  MG (50000 UNIT) CAPS capsule TAKE 1 CAPSULE BY MOUTH EVERY 10 DAYS 9 capsule 3   warfarin (COUMADIN ) 5 MG tablet TAKE A HALF TO 1 TABLET BY MOUTH DAILY AS DIRECTED BY ANTICOAGULATION CLINIC 75 tablet 1   No current facility-administered medications for this visit.    PAST MEDICAL HISTORY: Past Medical History:  Diagnosis Date   Arthritis    Atrial fibrillation (HCC)    Benign essential tremor    Breast cancer (HCC)    Cataract    Bilateral 2018   Chronic renal insufficiency    Common migraine 05/07/2015   Complication of anesthesia    Depression with anxiety    Drowsiness    Excessive daytime   Dyslipidemia    Fibromyalgia    History of partial seizures    Hypertension    Memory disturbance    Muscle tension headache    Obesity    Obstructive sleep apnea    PONV (postoperative nausea and vomiting)    S/P epidural steroid injection    Sacral contusion 07/2018   Seizures (HCC)    Partial   Sleep apnea     does not wear CPAP   Sprain of neck 06/26/2013   Tremors of nervous system    Vitamin B12 deficiency     PAST SURGICAL HISTORY: Past Surgical History:  Procedure Laterality Date   ABDOMINAL HYSTERECTOMY     ablation procedure     Cardiac, for Afib   BASAL CELL CARCINOMA EXCISION     BASAL CELL CARCINOMA EXCISION     BRAIN MENINGIOMA EXCISION Right    BREAST RECONSTRUCTION Bilateral    breast reconstructive     CATARACT EXTRACTION, BILATERAL     CHOLECYSTECTOMY     DILATION AND CURETTAGE OF UTERUS     dilution     ESOPHAGEAL MANOMETRY N/A 12/14/2016   Procedure: ESOPHAGEAL MANOMETRY (EM);  Surgeon: Gustav Shila GAILS, MD;  Location: [facility];  Service: Endoscopy;  Laterality: N/A;   gallbladder resection     LUMBAR SPINE SURGERY N/A    lumbosacral     mastectomies Bilateral    NOSE SURGERY     Basal cell carcinoma resection from the nose   TUBAL LIGATION N/A     FAMILY HISTORY: Family History  Problem Relation Age of Onset   Heart failure Mother    Diabetes Mother    Arthritis Mother    Hyperlipidemia Mother    Heart disease Mother    Stroke Mother    Hypertension Mother    Cancer - Colon Father    Colon cancer Father        late 13's   Arthritis Father    Cancer Father    Breast cancer Sister    Diabetes Maternal Grandfather    Heart failure Maternal Grandfather    Stroke Maternal Grandmother    Rectal cancer Maternal Uncle    Lung disease Neg Hx    Esophageal cancer Neg Hx    Stomach cancer Neg Hx     SOCIAL HISTORY: Social History   Socioeconomic History   Marital status: Married    Spouse name: Not on file   Number of children: 2   Years of education: College-2   Highest education level: Not on file  Occupational History   Occupation: RETIRED    Comment: Retired  Tobacco Use   Smoking status: Never   Smokeless tobacco: Never  Vaping Use   Vaping status: Never Used  Substance and Sexual  Activity   Alcohol use: No   Drug use: No   Sexual  activity: Not on file  Other Topics Concern   Not on file  Social History Narrative   Lives at home w/ her husband, daughter and significant other   Patient is right handed.   Patients drinks very little caffeine      [facility] (11/18/16):   Originally from [place]. Has previously lived in [place] & [place]. 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 No bird exposure. Has mold in a previous home in her basement after a pipe burst and flooded it.    Social Drivers of Corporate Investment Banker Strain: Low Risk  (06/17/2020)   Overall Financial Resource Strain (CARDIA)    Difficulty of Paying Living Expenses: Not hard at all  Food Insecurity: No Food Insecurity (01/09/2023)   Hunger Vital Sign    Worried About Running Out of Food in the Last Year: Never true    Ran Out of Food in the Last Year: Never true  Transportation Needs: No Transportation Needs (01/09/2023)   PRAPARE - Administrator, Civil Service (Medical): No    Lack of Transportation (Non-Medical): No  Physical Activity: Inactive (06/17/2020)   Exercise Vital Sign    Days of Exercise per Week: 0 days    Minutes of Exercise per Session: 0 min  Stress: Stress Concern Present (06/17/2020)   [employer] of Occupational Health - Occupational Stress Questionnaire    Feeling of Stress : To some extent  Social Connections: Moderately Isolated (06/17/2020)   Social Connection and Isolation Panel    Frequency of Communication with Friends and Family: More than three times a week    Frequency of Social Gatherings with Friends and Family: Twice a week    Attends Religious Services: Never    Database Administrator or Organizations: No    Attends Banker Meetings: Never    Marital Status: Married  Catering Manager Violence: Not At Risk (01/09/2023)   Humiliation, Afraid, Rape, and Kick questionnaire    Fear of Current or  Ex-Partner: No    Emotionally Abused: No    Physically Abused: No    Sexually Abused: No      Modena Callander, M.D. Ph.D.  [facility] [address], [address] [place], [place] [address] Ph: [phone] Fax: [phone]  CC:  Debbie Rosaline NOVAK, NP [address] [place],  [place] [address]  [place], Rosaline NOVAK, NP

## 2024-10-17 ENCOUNTER — Ambulatory Visit: Attending: Interventional Cardiology | Admitting: *Deleted

## 2024-10-17 DIAGNOSIS — Z5181 Encounter for therapeutic drug level monitoring: Secondary | ICD-10-CM

## 2024-10-17 DIAGNOSIS — I4891 Unspecified atrial fibrillation: Secondary | ICD-10-CM

## 2024-10-17 LAB — POCT INR: POC INR: 2.1

## 2024-10-17 NOTE — Patient Instructions (Addendum)
 Description   Inr 2.1; Called and spoke to pt and instructed her to continue taking warfarin 1 tablet daily except for 0.5 tablet on Sundays, Mondays, Wednesdays, and Fridays. Dental Extractions  on 11/12/2024. Hold warfarin 1/1 to 1/5.  (Resume warfarin the evening of the procedure unless the Dr says otherwise. Take an extra 1/2 tablet of warfarin  with usual dose for 2 days then resume normal dose.) Stay consistent with Boost - 2 times per week. Recheck INR 1 weeks post procedure.  [facility] [phone]

## 2024-10-17 NOTE — Progress Notes (Addendum)
 Lab Results  Component Value Date   INR 2.1 10/17/2024   INR 2.9 09/05/2024   INR 2.2 (H) 08/05/2024   Description   Inr 2.1; Called and spoke to pt and instructed her to continue taking warfarin 1 tablet daily except for 0.5 tablet on Sundays, Mondays, Wednesdays, and Fridays. Dental Extractions  on 11/12/2024. Hold warfarin 1/1 to 1/5.  (Resume warfarin the evening of the procedure unless the Dr says otherwise. Take an extra 1/2 tablet of warfarin  with usual dose for 2 days then resume normal dose.) Stay consistent with Boost - 2 times per week. Recheck INR 1 weeks post procedure.  [facility] [phone]

## 2024-11-03 ENCOUNTER — Other Ambulatory Visit: Payer: Self-pay | Admitting: Cardiology

## 2024-11-03 DIAGNOSIS — I480 Paroxysmal atrial fibrillation: Secondary | ICD-10-CM

## 2024-11-04 NOTE — Telephone Encounter (Signed)
 Refill request for warfarin:  Last INR was 2.1 on 10/17/24 Next INR due 11/20/24 LOV was 08/3024  Refill approved.

## 2024-11-20 ENCOUNTER — Ambulatory Visit: Attending: Interventional Cardiology | Admitting: *Deleted

## 2024-11-20 DIAGNOSIS — Z7901 Long term (current) use of anticoagulants: Secondary | ICD-10-CM | POA: Diagnosis not present

## 2024-11-20 DIAGNOSIS — Z7189 Other specified counseling: Secondary | ICD-10-CM

## 2024-11-20 DIAGNOSIS — I4891 Unspecified atrial fibrillation: Secondary | ICD-10-CM

## 2024-11-20 DIAGNOSIS — Z5181 Encounter for therapeutic drug level monitoring: Secondary | ICD-10-CM

## 2024-11-20 DIAGNOSIS — Z86711 Personal history of pulmonary embolism: Secondary | ICD-10-CM

## 2024-11-20 LAB — POCT INR: INR: 2.3 (ref 2.0–3.0)

## 2024-11-20 NOTE — Progress Notes (Signed)
 Description   INR-2.3; Continue taking warfarin 1 tablet daily except for 0.5 tablet on Sundays, Mondays, Wednesdays, and Fridays. Dental Extractions  on 12/03/24. Hold warfarin 11/28/24-12/02/24 (Resume warfarin the evening of the procedure unless the Dr says otherwise. Take an extra 1/2 tablet of warfarin  with usual dose for 2 days then resume normal dose.) Stay consistent with Boost-2 times per week. Recheck INR 1 week post procedure. [facility] [phone]

## 2024-11-20 NOTE — Patient Instructions (Addendum)
 Description   INR-2.3; Continue taking warfarin 1 tablet daily except for 0.5 tablet on Sundays, Mondays, Wednesdays, and Fridays. Dental Extractions  on 12/03/24. Hold warfarin 11/28/24-12/02/24 (Resume warfarin the evening of the procedure unless the Dr says otherwise. Take an extra 1/2 tablet of warfarin  with usual dose for 2 days then resume normal dose.) Stay consistent with Boost-2 times per week. Recheck INR 1 week post procedure. [facility] [phone]      Hold warfarin 11/28/24-12/02/24 (Resume warfarin the evening of the procedure unless the Dr says otherwise. Take an extra 1/2 tablet of warfarin  with usual dose for 2 days then resume normal dose.)

## 2024-12-11 ENCOUNTER — Ambulatory Visit

## 2024-12-16 ENCOUNTER — Ambulatory Visit
